# Patient Record
Sex: Male | Born: 1980 | Race: Black or African American | Hispanic: No | Marital: Married | Smoking: Current every day smoker
Health system: Southern US, Community
[De-identification: ages and names within clinical notes are randomized; demographics above are authoritative.]

## PROBLEM LIST (undated history)

## (undated) DIAGNOSIS — E119 Type 2 diabetes mellitus without complications: Secondary | ICD-10-CM

## (undated) DIAGNOSIS — G8220 Paraplegia, unspecified: Secondary | ICD-10-CM

## (undated) DIAGNOSIS — I10 Essential (primary) hypertension: Secondary | ICD-10-CM

## (undated) DIAGNOSIS — R531 Weakness: Secondary | ICD-10-CM

## (undated) DIAGNOSIS — R471 Dysarthria and anarthria: Secondary | ICD-10-CM

## (undated) DIAGNOSIS — Q159 Congenital malformation of eye, unspecified: Secondary | ICD-10-CM

## (undated) DIAGNOSIS — G35 Multiple sclerosis: Secondary | ICD-10-CM

## (undated) DIAGNOSIS — F329 Major depressive disorder, single episode, unspecified: Secondary | ICD-10-CM

## (undated) DIAGNOSIS — R41841 Cognitive communication deficit: Secondary | ICD-10-CM

## (undated) DIAGNOSIS — F32A Depression, unspecified: Secondary | ICD-10-CM

## (undated) HISTORY — PX: WISDOM TOOTH EXTRACTION: SHX21

## (undated) HISTORY — DX: Weakness: R53.1

## (undated) HISTORY — DX: Type 2 diabetes mellitus without complications: E11.9

## (undated) HISTORY — DX: Congenital malformation of eye, unspecified: Q15.9

## (undated) HISTORY — DX: Multiple sclerosis: G35

---

## 2008-12-18 ENCOUNTER — Ambulatory Visit (HOSPITAL_COMMUNITY): Admission: RE | Admit: 2008-12-18 | Discharge: 2008-12-18 | Payer: Self-pay | Admitting: Neurology

## 2010-08-06 LAB — BUN: BUN: 9 mg/dL (ref 6–23)

## 2010-08-06 LAB — CREATININE, SERUM: GFR calc non Af Amer: >60 mL/min (ref >60)

## 2010-09-01 ENCOUNTER — Encounter (HOSPITAL_COMMUNITY)
Admission: RE | Admit: 2010-09-01 | Discharge: 2010-09-01 | Disposition: A | Discharge status: Home / Self Care | Payer: 59 | Source: Ambulatory Visit | Attending: Neurology | Admitting: Neurology

## 2010-09-01 DIAGNOSIS — G35 Multiple sclerosis: Secondary | ICD-10-CM | POA: Insufficient documentation

## 2010-10-03 ENCOUNTER — Encounter (HOSPITAL_COMMUNITY)
Admission: RE | Admit: 2010-10-03 | Discharge: 2010-10-03 | Disposition: A | Discharge status: Home / Self Care | Payer: 59 | Source: Ambulatory Visit | Attending: Neurology | Admitting: Neurology

## 2010-10-03 ENCOUNTER — Encounter (HOSPITAL_COMMUNITY): Payer: Self-pay

## 2010-10-03 DIAGNOSIS — G35 Multiple sclerosis: Secondary | ICD-10-CM | POA: Insufficient documentation

## 2010-10-31 ENCOUNTER — Encounter (HOSPITAL_COMMUNITY): Payer: 59 | Attending: Neurology

## 2010-10-31 DIAGNOSIS — G35 Multiple sclerosis: Secondary | ICD-10-CM | POA: Insufficient documentation

## 2010-11-28 ENCOUNTER — Encounter (HOSPITAL_COMMUNITY): Payer: 59

## 2010-12-26 ENCOUNTER — Encounter (HOSPITAL_COMMUNITY): Payer: 59 | Attending: Neurology

## 2010-12-26 DIAGNOSIS — G35 Multiple sclerosis: Secondary | ICD-10-CM | POA: Insufficient documentation

## 2011-01-24 ENCOUNTER — Encounter (HOSPITAL_COMMUNITY)
Admission: RE | Admit: 2011-01-24 | Discharge: 2011-01-24 | Disposition: A | Discharge status: Home / Self Care | Payer: 59 | Source: Ambulatory Visit | Attending: Neurology | Admitting: Neurology

## 2011-01-24 DIAGNOSIS — G35 Multiple sclerosis: Secondary | ICD-10-CM | POA: Insufficient documentation

## 2011-02-26 ENCOUNTER — Emergency Department (HOSPITAL_COMMUNITY): Payer: 59

## 2011-02-26 ENCOUNTER — Inpatient Hospital Stay (HOSPITAL_COMMUNITY)
Admission: EM | Admit: 2011-02-26 | Discharge: 2011-02-27 | DRG: 200 | Disposition: A | Discharge status: Home / Self Care | Payer: 59 | Attending: Surgery | Admitting: Surgery

## 2011-02-26 DIAGNOSIS — S270XXA Traumatic pneumothorax, initial encounter: Principal | ICD-10-CM | POA: Diagnosis present

## 2011-02-26 DIAGNOSIS — Z79899 Other long term (current) drug therapy: Secondary | ICD-10-CM

## 2011-02-26 DIAGNOSIS — IMO0002 Reserved for concepts with insufficient information to code with codable children: Secondary | ICD-10-CM

## 2011-02-26 DIAGNOSIS — S2249XA Multiple fractures of ribs, unspecified side, initial encounter for closed fracture: Secondary | ICD-10-CM

## 2011-02-26 DIAGNOSIS — G35 Multiple sclerosis: Secondary | ICD-10-CM | POA: Diagnosis present

## 2011-02-26 DIAGNOSIS — Y92410 Unspecified street and highway as the place of occurrence of the external cause: Secondary | ICD-10-CM

## 2011-02-26 LAB — TYPE AND SCREEN
ABO/RH(D): O POS
Antibody Screen: NEGATIVE

## 2011-02-26 LAB — COMPREHENSIVE METABOLIC PANEL
AST: 98 U/L — ABNORMAL HIGH (ref 0–37)
BUN: 11 mg/dL (ref 6–23)
CO2: 22 mEq/L (ref 19–32)
Calcium: 9.7 mg/dL (ref 8.4–10.5)
Chloride: 102 mEq/L (ref 96–112)
Creatinine, Ser: 0.89 mg/dL (ref 0.50–1.35)
GFR calc Af Amer: >90 mL/min (ref >90)
GFR calc non Af Amer: >90 mL/min (ref >90)
Glucose, Bld: 113 mg/dL — ABNORMAL HIGH (ref 70–99)
Total Bilirubin: 0.3 mg/dL (ref 0.3–1.2)

## 2011-02-26 LAB — CBC
HCT: 43.2 % (ref 39.0–52.0)
MCH: 29.6 pg (ref 26.0–34.0)
MCHC: 35.2 g/dL (ref 30.0–36.0)
MCV: 84 fL (ref 78.0–100.0)
Platelets: 238 10*3/uL (ref 150–400)
RDW: 14.2 % (ref 11.5–15.5)

## 2011-02-26 LAB — POCT I-STAT, CHEM 8
BUN: 11 mg/dL (ref 6–23)
Calcium, Ion: 1.13 mmol/L (ref 1.12–1.32)
Chloride: 106 mEq/L (ref 96–112)
Glucose, Bld: 111 mg/dL — ABNORMAL HIGH (ref 70–99)
TCO2: 22 mmol/L (ref 0–100)

## 2011-02-26 LAB — URINE MICROSCOPIC-ADD ON

## 2011-02-26 LAB — URINALYSIS, ROUTINE W REFLEX MICROSCOPIC
Bilirubin Urine: NEGATIVE
Ketones, ur: NEGATIVE mg/dL
Specific Gravity, Urine: 1.008 (ref 1.005–1.030)
Urobilinogen, UA: 1 mg/dL (ref 0.0–1.0)

## 2011-02-26 LAB — ETHANOL: Alcohol, Ethyl (B): <11 mg/dL (ref 0–11)

## 2011-02-26 LAB — RAPID URINE DRUG SCREEN, HOSP PERFORMED
Barbiturates: NOT DETECTED
Cocaine: NOT DETECTED

## 2011-02-26 LAB — ABO/RH: ABO/RH(D): O POS

## 2011-02-26 MED ORDER — IOHEXOL 300 MG/ML  SOLN: 100.0000 mL | Freq: Once | INTRAMUSCULAR | Status: AC | PRN

## 2011-02-27 ENCOUNTER — Encounter (HOSPITAL_COMMUNITY): Payer: 59

## 2011-02-27 ENCOUNTER — Inpatient Hospital Stay (HOSPITAL_COMMUNITY): Payer: 59

## 2011-02-27 LAB — MRSA PCR SCREENING: MRSA by PCR: NEGATIVE

## 2011-02-27 LAB — CBC
Hemoglobin: 13.6 g/dL (ref 13.0–17.0)
RBC: 4.68 MIL/uL (ref 4.22–5.81)

## 2011-02-27 LAB — BASIC METABOLIC PANEL
CO2: 25 mEq/L (ref 19–32)
Glucose, Bld: 139 mg/dL — ABNORMAL HIGH (ref 70–99)
Potassium: 3.8 mEq/L (ref 3.5–5.1)
Sodium: 136 mEq/L (ref 135–145)

## 2011-02-28 ENCOUNTER — Telehealth (INDEPENDENT_AMBULATORY_CARE_PROVIDER_SITE_OTHER): Payer: Self-pay | Admitting: Physician Assistant

## 2011-02-28 LAB — GLUCOSE, CAPILLARY

## 2011-02-28 MED ORDER — OXYCODONE-ACETAMINOPHEN 5-325 MG PO TABS: 1.0000 | ORAL_TABLET | ORAL | Status: DC | PRN

## 2011-02-28 NOTE — Telephone Encounter (Signed)
Pt called to say his pain med is not helping (Vicodin) He requests something stronger. Rx for Percocet 5 written and pt wife to pick up RX from Trauma office.
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
## 2011-03-02 ENCOUNTER — Encounter (INDEPENDENT_AMBULATORY_CARE_PROVIDER_SITE_OTHER): Payer: Self-pay

## 2011-03-02 ENCOUNTER — Ambulatory Visit (INDEPENDENT_AMBULATORY_CARE_PROVIDER_SITE_OTHER): Payer: 59 | Admitting: Physician Assistant

## 2011-03-02 DIAGNOSIS — S2249XA Multiple fractures of ribs, unspecified side, initial encounter for closed fracture: Secondary | ICD-10-CM

## 2011-03-02 DIAGNOSIS — IMO0002 Reserved for concepts with insufficient information to code with codable children: Secondary | ICD-10-CM

## 2011-03-02 DIAGNOSIS — T07XXXA Unspecified multiple injuries, initial encounter: Secondary | ICD-10-CM

## 2011-03-02 DIAGNOSIS — G35 Multiple sclerosis: Secondary | ICD-10-CM

## 2011-03-02 MED ORDER — OXYCODONE-ACETAMINOPHEN 5-325 MG PO TABS: 1.0000 | ORAL_TABLET | ORAL | Status: AC | PRN

## 2011-03-02 MED ORDER — OXYCODONE-ACETAMINOPHEN 5-325 MG PO TABS: 1.0000 | ORAL_TABLET | ORAL | Status: DC | PRN
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
## 2011-03-02 NOTE — Patient Instructions (Signed)
Home Health Nursing for wound care.  Gradually increase activities as tolerated. Call trauma Service for concerns, but no follow up appointment needed unless having problems

## 2011-03-06 ENCOUNTER — Telehealth: Payer: Self-pay | Admitting: Orthopedic Surgery

## 2011-03-06 ENCOUNTER — Telehealth (INDEPENDENT_AMBULATORY_CARE_PROVIDER_SITE_OTHER): Payer: Self-pay | Admitting: General Surgery

## 2011-03-06 NOTE — Telephone Encounter (Signed)
Betsy of [facility] called asking if their protocol could be followed for this patient. In addition, they did not get the insurance information on this patient. I called her back at 11:36 to advise she would need to contact the trauma clinic directly in order to obtain the assistance she needed.

## 2011-03-06 NOTE — Telephone Encounter (Signed)
Left message for home health. They had called with questions about dressing changes.

## 2011-03-13 ENCOUNTER — Telehealth: Payer: Self-pay | Admitting: Physician Assistant

## 2011-03-13 MED ORDER — HYDROCODONE-ACETAMINOPHEN 5-325 MG PO TABS: 1.0000 | ORAL_TABLET | Freq: Four times a day (QID) | ORAL | Status: AC | PRN
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
## 2011-03-14 ENCOUNTER — Other Ambulatory Visit (HOSPITAL_COMMUNITY): Payer: Self-pay | Admitting: *Deleted

## 2011-03-16 ENCOUNTER — Encounter (HOSPITAL_COMMUNITY): Admission: RE | Admit: 2011-03-16 | Payer: 59 | Source: Ambulatory Visit

## 2011-03-20 ENCOUNTER — Encounter (HOSPITAL_COMMUNITY): Payer: Self-pay | Admitting: *Deleted

## 2011-03-21 ENCOUNTER — Other Ambulatory Visit (HOSPITAL_COMMUNITY): Payer: Self-pay | Admitting: *Deleted

## 2011-03-22 ENCOUNTER — Encounter (HOSPITAL_COMMUNITY)
Admission: RE | Admit: 2011-03-22 | Discharge: 2011-03-22 | Disposition: A | Discharge status: Home / Self Care | Payer: 59 | Source: Ambulatory Visit | Attending: Neurology | Admitting: Neurology

## 2011-03-22 ENCOUNTER — Encounter (HOSPITAL_COMMUNITY): Payer: Self-pay

## 2011-03-22 ENCOUNTER — Other Ambulatory Visit (HOSPITAL_COMMUNITY): Payer: Self-pay | Admitting: *Deleted

## 2011-03-22 DIAGNOSIS — G35 Multiple sclerosis: Secondary | ICD-10-CM | POA: Insufficient documentation

## 2011-03-22 HISTORY — DX: Essential (primary) hypertension: I10

## 2011-03-22 MED ORDER — ACETAMINOPHEN 500 MG PO TABS
1000.0000 mg | ORAL_TABLET | Freq: Once | ORAL | Status: AC
  Administered 2011-03-22: 1000 mg via ORAL
  Filled 2011-03-22: qty 2

## 2011-03-22 MED ORDER — SODIUM CHLORIDE 0.9 % IV SOLN
INTRAVENOUS | Status: DC
  Administered 2011-03-22: 250 mL via INTRAVENOUS

## 2011-03-22 MED ORDER — LORATADINE 10 MG PO TABS
10.0000 mg | ORAL_TABLET | Freq: Every day | ORAL | Status: DC
  Administered 2011-03-22: 10 mg via ORAL

## 2011-03-22 MED ORDER — SODIUM CHLORIDE 0.9 % IV SOLN
300.0000 mg | INTRAVENOUS | Status: DC
  Administered 2011-03-22: 300 mg via INTRAVENOUS
  Filled 2011-03-22: qty 15

## 2011-04-19 ENCOUNTER — Encounter (HOSPITAL_COMMUNITY): Admission: RE | Admit: 2011-04-19 | Payer: 59 | Source: Ambulatory Visit

## 2011-04-21 ENCOUNTER — Other Ambulatory Visit: Payer: Self-pay | Admitting: Neurology

## 2011-04-21 DIAGNOSIS — G35 Multiple sclerosis: Secondary | ICD-10-CM

## 2011-04-21 MED ORDER — SODIUM CHLORIDE 0.9 % IV SOLN: 500.0000 mg | INTRAVENOUS | Status: DC | PRN

## 2011-04-21 MED ORDER — SODIUM CHLORIDE 0.9 % IV SOLN: INTRAVENOUS | Status: DC

## 2011-04-21 MED ORDER — LORATADINE 10 MG PO TABS: 10.0000 mg | ORAL_TABLET | Freq: Once | ORAL | Status: DC

## 2011-04-21 MED ORDER — SODIUM CHLORIDE 0.9 % IV SOLN: 300.0000 mg | INTRAVENOUS | Status: DC

## 2011-04-21 MED ORDER — ACETAMINOPHEN 500 MG PO TABS: 1000.0000 mg | ORAL_TABLET | Freq: Once | ORAL | Status: DC

## 2011-04-21 MED ORDER — EPINEPHRINE HCL 1 MG/ML IJ SOLN: 1.0000 mg | INTRAMUSCULAR | Status: DC | PRN

## 2011-04-21 MED ORDER — EPINEPHRINE 0.3 MG/0.3ML IJ DEVI: 0.3000 mg | INTRAMUSCULAR | Status: DC | PRN

## 2011-04-26 ENCOUNTER — Encounter (HOSPITAL_COMMUNITY)
Admission: RE | Admit: 2011-04-26 | Discharge: 2011-04-26 | Disposition: A | Discharge status: Home / Self Care | Payer: 59 | Source: Ambulatory Visit | Attending: Neurology | Admitting: Neurology

## 2011-04-26 DIAGNOSIS — G35 Multiple sclerosis: Secondary | ICD-10-CM | POA: Insufficient documentation

## 2011-05-09 ENCOUNTER — Encounter (HOSPITAL_COMMUNITY)
Admission: RE | Admit: 2011-05-09 | Discharge: 2011-05-09 | Disposition: A | Discharge status: Home / Self Care | Payer: 59 | Source: Ambulatory Visit | Attending: Neurology | Admitting: Neurology

## 2011-05-09 DIAGNOSIS — G35 Multiple sclerosis: Secondary | ICD-10-CM | POA: Insufficient documentation

## 2011-05-09 MED ORDER — SODIUM CHLORIDE 0.9 % IV SOLN
500.0000 mg | INTRAVENOUS | Status: DC | PRN
  Filled 2011-05-09: qty 4

## 2011-05-09 MED ORDER — SODIUM CHLORIDE 0.9 % IV SOLN
INTRAVENOUS | Status: DC
  Administered 2011-05-09: 13:00:00 via INTRAVENOUS

## 2011-05-09 MED ORDER — ACETAMINOPHEN 500 MG PO TABS
ORAL_TABLET | ORAL | Status: AC
  Administered 2011-05-09: 1000 mg via ORAL
  Filled 2011-05-09: qty 2

## 2011-05-09 MED ORDER — ACETAMINOPHEN 500 MG PO TABS
1000.0000 mg | ORAL_TABLET | ORAL | Status: DC
  Administered 2011-05-09: 1000 mg via ORAL
  Filled 2011-05-09: qty 2

## 2011-05-09 MED ORDER — LORATADINE 10 MG PO TABS
10.0000 mg | ORAL_TABLET | ORAL | Status: DC
  Administered 2011-05-09: 10 mg via ORAL

## 2011-05-09 MED ORDER — EPINEPHRINE 0.3 MG/0.3ML IJ DEVI
0.3000 mg | INTRAMUSCULAR | Status: DC | PRN
  Filled 2011-05-09: qty 0.6

## 2011-05-09 MED ORDER — SODIUM CHLORIDE 0.9 % IV SOLN
300.0000 mg | INTRAVENOUS | Status: DC
  Administered 2011-05-09: 300 mg via INTRAVENOUS
  Filled 2011-05-09: qty 15

## 2011-06-06 ENCOUNTER — Encounter (HOSPITAL_COMMUNITY): Admission: RE | Admit: 2011-06-06 | Payer: 59 | Source: Ambulatory Visit

## 2011-06-14 NOTE — Progress Notes (Signed)
Called pt to remind him of 06/15/11 appt. No answer.

## 2011-06-15 ENCOUNTER — Encounter (HOSPITAL_COMMUNITY)
Admission: RE | Admit: 2011-06-15 | Discharge: 2011-06-15 | Disposition: A | Discharge status: Home / Self Care | Payer: 59 | Source: Ambulatory Visit | Attending: Neurology | Admitting: Neurology

## 2011-06-15 ENCOUNTER — Encounter (HOSPITAL_COMMUNITY): Payer: Self-pay

## 2011-06-15 DIAGNOSIS — G35 Multiple sclerosis: Secondary | ICD-10-CM | POA: Insufficient documentation

## 2011-06-15 MED ORDER — ACETAMINOPHEN 500 MG PO TABS
1000.0000 mg | ORAL_TABLET | ORAL | Status: DC
  Administered 2011-06-15: 1000 mg via ORAL

## 2011-06-15 MED ORDER — LORATADINE 10 MG PO TABS
10.0000 mg | ORAL_TABLET | ORAL | Status: DC
  Administered 2011-06-15: 10 mg via ORAL

## 2011-06-15 MED ORDER — SODIUM CHLORIDE 0.9 % IV SOLN
INTRAVENOUS | Status: DC
  Administered 2011-06-15: 09:00:00 via INTRAVENOUS

## 2011-06-15 MED ORDER — SODIUM CHLORIDE 0.9 % IV SOLN
300.0000 mg | INTRAVENOUS | Status: DC
  Filled 2011-06-15: qty 15

## 2011-06-15 NOTE — Discharge Instructions (Signed)
Natalizumab injection What is this medicine? NATALIZUMAB (na ta LIZ you mab) is used to treat relapsing multiple sclerosis. This drug is not a cure. It is also used to treat Crohn's disease. This medicine may be used for other purposes; ask your health care provider or pharmacist if you have questions. What should I tell my health care provider before I take this medicine? They need to know if you have any of these conditions: -immune system problems -progressive multifocal leukoencephalopathy (PML) -an unusual or allergic reaction to natalizumab, other medicines, foods, dyes, or preservatives -pregnant or trying to get pregnant -breast-feeding How should I use this medicine? This medicine is for infusion into a vein. It is given by a health care professional in a hospital or clinic setting. A special MedGuide will be given to you by the pharmacist with each prescription and refill. Be sure to read this information carefully each time. Talk to your pediatrician regarding the use of this medicine in children. This medicine is not approved for use in children. Overdosage: If you think you have taken too much of this medicine contact a poison control center or emergency room at once. NOTE: This medicine is only for you. Do not share this medicine with others. What if I miss a dose? It is important not to miss your dose. Call your doctor or health care professional if you are unable to keep an appointment. What may interact with this medicine? -azathioprine -cyclosporine -interferon -6-mercaptopurine -methotrexate -steroid medicines like prednisone or cortisone -TNF-alpha inhibitors like adalimumab, etanercept, and infliximab -vaccines This list may not describe all possible interactions. Give your health care provider a list of all the medicines, herbs, non-prescription drugs, or dietary supplements you use. Also tell them if you smoke, drink alcohol, or use illegal drugs. Some items may  interact with your medicine. What should I watch for while using this medicine? Your condition will be monitored carefully while you are receiving this medicine. Visit your doctor for regular check ups. Tell your doctor or healthcare professional if your symptoms do not start to get better or if they get worse. Stay away from people who are sick. Call your doctor or health care professional for advice if you get a fever, chills or sore throat, or other symptoms of a cold or flu. Do not treat yourself. In some patients, this medicine may cause a serious brain infection that may cause death. If you have any problems seeing, thinking, speaking, walking, or standing, tell your doctor right away. If you cannot reach your doctor, get urgent medical care. What side effects may I notice from receiving this medicine? Side effects that you should report to your doctor or health care professional as soon as possible: -allergic reactions like skin rash, itching or hives, swelling of the face, lips, or tongue -breathing problems -changes in vision -chest pain -dark urine -depression, feelings of sadness -dizziness -general ill feeling or flu-like symptoms -irregular, missed, or painful menstrual periods -light-colored stools -loss of appetite, nausea -muscle weakness -problems with balance, talking, or walking -right upper belly pain -unusually weak or tired -yellowing of the eyes or skin Side effects that usually do not require medical attention (report to your doctor or health care professional if they continue or are bothersome): -aches, pains -headache -stomach upset -tiredness This list may not describe all possible side effects. Call your doctor for medical advice about side effects. You may report side effects to FDA at 1-800-FDA-1088. Where should I keep my medicine? This drug   is given in a hospital or clinic and will not be stored at home. NOTE: This sheet is a summary. It may not cover  all possible information. If you have questions about this medicine, talk to your doctor, pharmacist, or health care provider.  2012, Elsevier/Gold Standard. (06/06/2008 1:33:21 PM) 

## 2011-07-13 ENCOUNTER — Encounter (HOSPITAL_COMMUNITY)
Admission: RE | Admit: 2011-07-13 | Discharge: 2011-07-13 | Disposition: A | Discharge status: Home / Self Care | Payer: Medicare Other | Source: Ambulatory Visit | Attending: Neurology | Admitting: Neurology

## 2011-07-13 ENCOUNTER — Encounter (HOSPITAL_COMMUNITY): Payer: Self-pay

## 2011-07-13 DIAGNOSIS — G35 Multiple sclerosis: Secondary | ICD-10-CM | POA: Insufficient documentation

## 2011-07-13 MED ORDER — SODIUM CHLORIDE 0.9 % IV SOLN
INTRAVENOUS | Status: AC
  Administered 2011-07-13: 08:00:00 via INTRAVENOUS

## 2011-07-13 MED ORDER — SODIUM CHLORIDE 0.9 % IV SOLN
300.0000 mg | INTRAVENOUS | Status: DC
  Administered 2011-07-13: 300 mg via INTRAVENOUS
  Filled 2011-07-13: qty 15

## 2011-07-13 MED ORDER — ACETAMINOPHEN 500 MG PO TABS
1000.0000 mg | ORAL_TABLET | ORAL | Status: DC
  Administered 2011-07-13: 1000 mg via ORAL

## 2011-07-13 MED ORDER — LORATADINE 10 MG PO TABS
10.0000 mg | ORAL_TABLET | ORAL | Status: DC
  Administered 2011-07-13: 10 mg via ORAL

## 2011-07-13 NOTE — Discharge Instructions (Signed)
Natalizumab injection What is this medicine? NATALIZUMAB (na ta LIZ you mab) is used to treat relapsing multiple sclerosis. This drug is not a cure. It is also used to treat Crohn's disease. This medicine may be used for other purposes; ask your health care provider or pharmacist if you have questions. What should I tell my health care provider before I take this medicine? They need to know if you have any of these conditions: -immune system problems -progressive multifocal leukoencephalopathy (PML) -an unusual or allergic reaction to natalizumab, other medicines, foods, dyes, or preservatives -pregnant or trying to get pregnant -breast-feeding How should I use this medicine? This medicine is for infusion into a vein. It is given by a health care professional in a hospital or clinic setting. A special MedGuide will be given to you by the pharmacist with each prescription and refill. Be sure to read this information carefully each time. Talk to your pediatrician regarding the use of this medicine in children. This medicine is not approved for use in children. Overdosage: If you think you have taken too much of this medicine contact a poison control center or emergency room at once. NOTE: This medicine is only for you. Do not share this medicine with others. What if I miss a dose? It is important not to miss your dose. Call your doctor or health care professional if you are unable to keep an appointment. What may interact with this medicine? -azathioprine -cyclosporine -interferon -6-mercaptopurine -methotrexate -steroid medicines like prednisone or cortisone -TNF-alpha inhibitors like adalimumab, etanercept, and infliximab -vaccines This list may not describe all possible interactions. Give your health care provider a list of all the medicines, herbs, non-prescription drugs, or dietary supplements you use. Also tell them if you smoke, drink alcohol, or use illegal drugs. Some items may  interact with your medicine. What should I watch for while using this medicine? Your condition will be monitored carefully while you are receiving this medicine. Visit your doctor for regular check ups. Tell your doctor or healthcare professional if your symptoms do not start to get better or if they get worse. Stay away from people who are sick. Call your doctor or health care professional for advice if you get a fever, chills or sore throat, or other symptoms of a cold or flu. Do not treat yourself. In some patients, this medicine may cause a serious brain infection that may cause death. If you have any problems seeing, thinking, speaking, walking, or standing, tell your doctor right away. If you cannot reach your doctor, get urgent medical care. What side effects may I notice from receiving this medicine? Side effects that you should report to your doctor or health care professional as soon as possible: -allergic reactions like skin rash, itching or hives, swelling of the face, lips, or tongue -breathing problems -changes in vision -chest pain -dark urine -depression, feelings of sadness -dizziness -general ill feeling or flu-like symptoms -irregular, missed, or painful menstrual periods -light-colored stools -loss of appetite, nausea -muscle weakness -problems with balance, talking, or walking -right upper belly pain -unusually weak or tired -yellowing of the eyes or skin Side effects that usually do not require medical attention (report to your doctor or health care professional if they continue or are bothersome): -aches, pains -headache -stomach upset -tiredness This list may not describe all possible side effects. Call your doctor for medical advice about side effects. You may report side effects to FDA at 1-800-FDA-1088. Where should I keep my medicine? This drug   is given in a hospital or clinic and will not be stored at home. NOTE: This sheet is a summary. It may not cover  all possible information. If you have questions about this medicine, talk to your doctor, pharmacist, or health care provider.  2012, Elsevier/Gold Standard. (06/06/2008 1:33:21 PM) 

## 2011-07-13 NOTE — Progress Notes (Signed)
Patient didn't want to stay and be observed/monitored for the one hour post infusion.

## 2011-08-01 ENCOUNTER — Other Ambulatory Visit: Payer: Self-pay | Admitting: Neurology

## 2011-08-01 DIAGNOSIS — N529 Male erectile dysfunction, unspecified: Secondary | ICD-10-CM

## 2011-08-01 DIAGNOSIS — F5102 Adjustment insomnia: Secondary | ICD-10-CM

## 2011-08-01 DIAGNOSIS — Z79899 Other long term (current) drug therapy: Secondary | ICD-10-CM

## 2011-08-03 ENCOUNTER — Ambulatory Visit
Admission: RE | Admit: 2011-08-03 | Discharge: 2011-08-03 | Disposition: A | Discharge status: Home / Self Care | Payer: Medicare Other | Source: Ambulatory Visit | Attending: Neurology | Admitting: Neurology

## 2011-08-03 DIAGNOSIS — Z79899 Other long term (current) drug therapy: Secondary | ICD-10-CM

## 2011-08-03 DIAGNOSIS — F5102 Adjustment insomnia: Secondary | ICD-10-CM

## 2011-08-03 DIAGNOSIS — N529 Male erectile dysfunction, unspecified: Secondary | ICD-10-CM

## 2011-08-03 MED ORDER — GADOBENATE DIMEGLUMINE 529 MG/ML IV SOLN
18.0000 mL | Freq: Once | INTRAVENOUS | Status: AC | PRN
  Administered 2011-08-03: 18 mL via INTRAVENOUS

## 2011-08-10 ENCOUNTER — Encounter (HOSPITAL_COMMUNITY): Admission: RE | Admit: 2011-08-10 | Payer: 59 | Source: Ambulatory Visit

## 2011-09-01 ENCOUNTER — Encounter (HOSPITAL_COMMUNITY)
Admission: RE | Admit: 2011-09-01 | Discharge: 2011-09-01 | Disposition: A | Discharge status: Home / Self Care | Payer: Medicare Other | Source: Ambulatory Visit | Attending: Neurology | Admitting: Neurology

## 2011-09-01 DIAGNOSIS — G35 Multiple sclerosis: Secondary | ICD-10-CM | POA: Insufficient documentation

## 2011-09-01 MED ORDER — SODIUM CHLORIDE 0.9 % IV SOLN
INTRAVENOUS | Status: DC
  Administered 2011-09-01: 12:00:00 via INTRAVENOUS

## 2011-09-01 MED ORDER — LORATADINE 10 MG PO TABS
ORAL_TABLET | ORAL | Status: AC
  Filled 2011-09-01: qty 1

## 2011-09-01 MED ORDER — LORATADINE 10 MG PO TABS
10.0000 mg | ORAL_TABLET | ORAL | Status: AC
  Administered 2011-09-01: 10 mg via ORAL

## 2011-09-01 MED ORDER — SODIUM CHLORIDE 0.9 % IV SOLN
300.0000 mg | INTRAVENOUS | Status: AC
  Administered 2011-09-01: 300 mg via INTRAVENOUS
  Filled 2011-09-01: qty 15

## 2011-09-01 MED ORDER — ACETAMINOPHEN 500 MG PO TABS
ORAL_TABLET | ORAL | Status: AC
  Filled 2011-09-01: qty 2

## 2011-09-01 MED ORDER — ACETAMINOPHEN 500 MG PO TABS
1000.0000 mg | ORAL_TABLET | ORAL | Status: AC
  Administered 2011-09-01: 1000 mg via ORAL

## 2011-09-01 NOTE — Progress Notes (Signed)
Patient prefers to leave now. Doesn't want to stay for observation. Out ambulatory w wife

## 2011-09-01 NOTE — Progress Notes (Signed)
Patient returned for treatment with wife and security. Cooperative at present.

## 2011-09-01 NOTE — Progress Notes (Signed)
Pt  Cursing and left here because he wanted IV started in anticubital, said he was paying cash and we could turn heat up also, said we had flat screens to "impress" but we wouldn't turn up heat cause it cost too much.  He got irate and got up and left.  Using loud curse words in hallway, said "who does she think she is to  question me .  These were questions about his health, such as are your home meds still the same  He refuses to stay

## 2011-09-01 NOTE — Discharge Instructions (Signed)
Call MD for any problems Return to Short Stay May 31 at 0830

## 2011-09-01 NOTE — Progress Notes (Signed)
Sandy  with dr Terrace Arabia notified of pts actions and his leaving.  States dr Terrace Arabia is trying to call him and his wife to have pt come back and receive tysabri. I have talked to terry, my Facilities manager two times about this, and we will certainly infuse this pt if he chooses to come back

## 2011-09-01 NOTE — Progress Notes (Signed)
After pt had been asked all questions on MCA checklist and pre-infusion checklist for tysabri, pt then tells me he did ttreat himself c prednisone he had at home.  This was for blurred vision  Call placed to dr Terrace Arabia and she stated proceed c tysabri

## 2011-09-29 ENCOUNTER — Encounter (HOSPITAL_COMMUNITY): Payer: Medicare Other

## 2011-10-11 ENCOUNTER — Encounter (HOSPITAL_COMMUNITY): Admission: RE | Admit: 2011-10-11 | Payer: Medicare Other | Source: Ambulatory Visit

## 2011-10-12 ENCOUNTER — Encounter (HOSPITAL_COMMUNITY): Payer: Medicare Other

## 2011-10-17 MED ORDER — SODIUM CHLORIDE 0.9 % IV SOLN: 300.0000 mg | INTRAVENOUS | Status: DC

## 2011-10-17 MED ORDER — ACETAMINOPHEN 500 MG PO TABS: 1000.0000 mg | ORAL_TABLET | ORAL | Status: DC

## 2011-10-17 MED ORDER — LORATADINE 10 MG PO TABS: 10.0000 mg | ORAL_TABLET | ORAL | Status: DC

## 2011-10-17 MED ORDER — SODIUM CHLORIDE 0.9 % IV SOLN: INTRAVENOUS | Status: DC

## 2011-10-17 NOTE — Progress Notes (Signed)
Patient called and reminded of app't in short stay for his tysabri.

## 2011-10-18 ENCOUNTER — Encounter (HOSPITAL_COMMUNITY)
Admission: RE | Admit: 2011-10-18 | Discharge: 2011-10-18 | Disposition: A | Discharge status: Home / Self Care | Payer: Medicare Other | Source: Ambulatory Visit | Attending: Neurology | Admitting: Neurology

## 2011-10-18 ENCOUNTER — Encounter (HOSPITAL_COMMUNITY): Payer: Self-pay

## 2011-10-18 ENCOUNTER — Other Ambulatory Visit: Payer: Self-pay | Admitting: Neurology

## 2011-10-18 DIAGNOSIS — G35 Multiple sclerosis: Secondary | ICD-10-CM | POA: Insufficient documentation

## 2011-10-18 MED ORDER — ACETAMINOPHEN 500 MG PO TABS
1000.0000 mg | ORAL_TABLET | ORAL | Status: DC
  Administered 2011-10-18: 1000 mg via ORAL

## 2011-10-18 MED ORDER — LORATADINE 10 MG PO TABS: 10.0000 mg | ORAL_TABLET | ORAL | Status: DC

## 2011-10-18 MED ORDER — METHYLPREDNISOLONE SODIUM SUCC 125 MG IJ SOLR
500.0000 mg | INTRAMUSCULAR | Status: DC
Start: 2011-10-18 — End: 2012-02-07
  Filled 2011-10-18: qty 8

## 2011-10-18 MED ORDER — ACETAMINOPHEN 500 MG PO TABS
ORAL_TABLET | ORAL | Status: AC
  Administered 2011-10-18: 1000 mg via ORAL
  Filled 2011-10-18: qty 2

## 2011-10-18 MED ORDER — SODIUM CHLORIDE 0.9 % IV SOLN: INTRAVENOUS | Status: DC

## 2011-10-18 MED ORDER — SODIUM CHLORIDE 0.9 % IV SOLN: 300.0000 mg | INTRAVENOUS | Status: DC

## 2011-10-18 MED ORDER — EPINEPHRINE 0.3 MG/0.3ML IJ DEVI
0.3000 mg | INTRAMUSCULAR | Status: DC
Start: 2011-10-18 — End: 2012-02-07
  Filled 2011-10-18: qty 0.6

## 2011-10-18 MED ORDER — LORATADINE 10 MG PO TABS
ORAL_TABLET | ORAL | Status: AC
  Administered 2011-10-18: 10 mg via ORAL
  Filled 2011-10-18: qty 1

## 2011-10-18 MED ORDER — ACETAMINOPHEN 325 MG PO TABS: 1000.0000 mg | ORAL_TABLET | ORAL | Status: DC

## 2011-10-18 MED ORDER — LORATADINE 10 MG PO TABS
10.0000 mg | ORAL_TABLET | ORAL | Status: DC
  Administered 2011-10-18: 10 mg via ORAL

## 2011-10-18 MED ORDER — SODIUM CHLORIDE 0.9 % IV SOLN
300.0000 mg | INTRAVENOUS | Status: DC
  Administered 2011-10-18: 300 mg via INTRAVENOUS
  Filled 2011-10-18: qty 15

## 2011-10-18 MED ORDER — EPINEPHRINE 0.3 MG/0.3ML IJ DEVI: 0.3000 mg | INTRAMUSCULAR | Status: DC

## 2011-10-18 MED ORDER — METHYLPREDNISOLONE SODIUM SUCC 125 MG IJ SOLR: 500.0000 mg | INTRAMUSCULAR | Status: DC

## 2011-10-18 MED ORDER — SODIUM CHLORIDE 0.9 % IV SOLN
INTRAVENOUS | Status: DC
  Administered 2011-10-18: 10:00:00 via INTRAVENOUS

## 2011-10-18 NOTE — Discharge Instructions (Signed)
Natalizumab injection What is this medicine? NATALIZUMAB (na ta LIZ you mab) is used to treat relapsing multiple sclerosis. This drug is not a cure. It is also used to treat Crohn's disease. This medicine may be used for other purposes; ask your health care provider or pharmacist if you have questions. What should I tell my health care provider before I take this medicine? They need to know if you have any of these conditions: -immune system problems -progressive multifocal leukoencephalopathy (PML) -an unusual or allergic reaction to natalizumab, other medicines, foods, dyes, or preservatives -pregnant or trying to get pregnant -breast-feeding How should I use this medicine? This medicine is for infusion into a vein. It is given by a health care professional in a hospital or clinic setting. A special MedGuide will be given to you by the pharmacist with each prescription and refill. Be sure to read this information carefully each time. Talk to your pediatrician regarding the use of this medicine in children. This medicine is not approved for use in children. Overdosage: If you think you have taken too much of this medicine contact a poison control center or emergency room at once. NOTE: This medicine is only for you. Do not share this medicine with others. What if I miss a dose? It is important not to miss your dose. Call your doctor or health care professional if you are unable to keep an appointment. What may interact with this medicine? -azathioprine -cyclosporine -interferon -6-mercaptopurine -methotrexate -steroid medicines like prednisone or cortisone -TNF-alpha inhibitors like adalimumab, etanercept, and infliximab -vaccines This list may not describe all possible interactions. Give your health care provider a list of all the medicines, herbs, non-prescription drugs, or dietary supplements you use. Also tell them if you smoke, drink alcohol, or use illegal drugs. Some items may  interact with your medicine. What should I watch for while using this medicine? Your condition will be monitored carefully while you are receiving this medicine. Visit your doctor for regular check ups. Tell your doctor or healthcare professional if your symptoms do not start to get better or if they get worse. Stay away from people who are sick. Call your doctor or health care professional for advice if you get a fever, chills or sore throat, or other symptoms of a cold or flu. Do not treat yourself. In some patients, this medicine may cause a serious brain infection that may cause death. If you have any problems seeing, thinking, speaking, walking, or standing, tell your doctor right away. If you cannot reach your doctor, get urgent medical care. What side effects may I notice from receiving this medicine? Side effects that you should report to your doctor or health care professional as soon as possible: -allergic reactions like skin rash, itching or hives, swelling of the face, lips, or tongue -breathing problems -changes in vision -chest pain -dark urine -depression, feelings of sadness -dizziness -general ill feeling or flu-like symptoms -irregular, missed, or painful menstrual periods -light-colored stools -loss of appetite, nausea -muscle weakness -problems with balance, talking, or walking -right upper belly pain -unusually weak or tired -yellowing of the eyes or skin Side effects that usually do not require medical attention (report to your doctor or health care professional if they continue or are bothersome): -aches, pains -headache -stomach upset -tiredness This list may not describe all possible side effects. Call your doctor for medical advice about side effects. You may report side effects to FDA at 1-800-FDA-1088. Where should I keep my medicine? This drug   is given in a hospital or clinic and will not be stored at home. NOTE: This sheet is a summary. It may not cover  all possible information. If you have questions about this medicine, talk to your doctor, pharmacist, or health care provider.  2012, Elsevier/Gold Standard. (06/06/2008 1:33:21 PM) 

## 2011-11-15 ENCOUNTER — Encounter (HOSPITAL_COMMUNITY)
Admission: RE | Admit: 2011-11-15 | Discharge: 2011-11-15 | Disposition: A | Discharge status: Home / Self Care | Payer: Medicare Other | Source: Ambulatory Visit | Attending: Neurology | Admitting: Neurology

## 2011-11-15 ENCOUNTER — Encounter (HOSPITAL_COMMUNITY): Payer: Self-pay

## 2011-11-15 DIAGNOSIS — G35 Multiple sclerosis: Secondary | ICD-10-CM | POA: Insufficient documentation

## 2011-11-15 MED ORDER — LORATADINE 5 MG/5ML PO SYRP: 10.0000 mg | ORAL_SOLUTION | ORAL | Status: DC

## 2011-11-15 MED ORDER — ACETAMINOPHEN 500 MG PO TABS
1000.0000 mg | ORAL_TABLET | ORAL | Status: DC
  Administered 2011-11-15: 1000 mg via ORAL

## 2011-11-15 MED ORDER — LORATADINE 10 MG PO TABS
10.0000 mg | ORAL_TABLET | Freq: Once | ORAL | Status: AC
  Administered 2011-11-15: 10 mg via ORAL

## 2011-11-15 MED ORDER — ACETAMINOPHEN 500 MG PO TABS
ORAL_TABLET | ORAL | Status: AC
  Administered 2011-11-15: 1000 mg via ORAL
  Filled 2011-11-15: qty 2

## 2011-11-15 MED ORDER — LORATADINE 10 MG PO TABS
ORAL_TABLET | ORAL | Status: AC
  Administered 2011-11-15: 10 mg via ORAL
  Filled 2011-11-15: qty 1

## 2011-11-15 MED ORDER — SODIUM CHLORIDE 0.9 % IV SOLN
300.0000 mg | INTRAVENOUS | Status: DC
  Administered 2011-11-15: 300 mg via INTRAVENOUS
  Filled 2011-11-15: qty 15

## 2011-11-15 MED ORDER — SODIUM CHLORIDE 0.9 % IV SOLN
INTRAVENOUS | Status: DC
  Administered 2011-11-15: 09:00:00 via INTRAVENOUS

## 2011-11-29 ENCOUNTER — Encounter (HOSPITAL_COMMUNITY): Payer: Self-pay | Admitting: Emergency Medicine

## 2011-11-29 ENCOUNTER — Emergency Department (HOSPITAL_COMMUNITY)
Admission: EM | Admit: 2011-11-29 | Discharge: 2011-11-29 | Disposition: A | Discharge status: Home / Self Care | Payer: Medicare Other | Attending: Emergency Medicine | Admitting: Emergency Medicine

## 2011-11-29 DIAGNOSIS — G35 Multiple sclerosis: Secondary | ICD-10-CM | POA: Insufficient documentation

## 2011-11-29 DIAGNOSIS — F32A Depression, unspecified: Secondary | ICD-10-CM

## 2011-11-29 DIAGNOSIS — F3289 Other specified depressive episodes: Secondary | ICD-10-CM | POA: Insufficient documentation

## 2011-11-29 DIAGNOSIS — F329 Major depressive disorder, single episode, unspecified: Secondary | ICD-10-CM | POA: Insufficient documentation

## 2011-11-29 HISTORY — DX: Depression, unspecified: F32.A

## 2011-11-29 HISTORY — DX: Major depressive disorder, single episode, unspecified: F32.9

## 2011-11-29 LAB — BASIC METABOLIC PANEL
BUN: 8 mg/dL (ref 6–23)
Calcium: 10 mg/dL (ref 8.4–10.5)
GFR calc non Af Amer: >90 mL/min (ref >90)
Glucose, Bld: 108 mg/dL — ABNORMAL HIGH (ref 70–99)

## 2011-11-29 LAB — CBC WITH DIFFERENTIAL/PLATELET
Basophils Relative: 1 % (ref 0–1)
Eosinophils Absolute: 0.1 10*3/uL (ref 0.0–0.7)
MCH: 28.2 pg (ref 26.0–34.0)
MCHC: 33.4 g/dL (ref 30.0–36.0)
Neutrophils Relative %: 41 % — ABNORMAL LOW (ref 43–77)
Platelets: 235 10*3/uL (ref 150–400)

## 2011-11-29 NOTE — ED Notes (Signed)
Pt belongings returned verified by myself with Candice and with pt. Pt verified that all his belongings were returned to him. Pt verbalized understanding to call the suicide hotline, 911, or return here immediately upon return of SI.

## 2011-11-29 NOTE — ED Provider Notes (Signed)
History     CSN: [id number]  Arrival date & time 11/29/11  0106   None     Chief Complaint  Patient presents with  . Suicidal    (Consider location/radiation/quality/duration/timing/severity/associated sxs/prior treatment) HPI Comments: Patient states he is depressed and suicidal.  He has no specific plan.  He, states nobody can understand what he is going through because he has multiple sclerosis unless we are in his position.  We can't understand he, states he's been taking Zoloft without any relief.  He is also been taking TYSABRI  Infusions for his multiple sclerosis without any perceived improvement.  He states his family.  Just doesn't understand why he has short-term memory loss or can't perform it is previous level of functioning.  He, states the only thing that gives him any relief is smoking, high-grade, marijuana  The history is provided by the patient.    Past Medical History  Diagnosis Date  . Multiple sclerosis   . Weakness     generalized  . Eye abnormalities     unspecified by pt  . Hypertension   . Depression     No past surgical history on file.  No family history on file.  History  Substance Use Topics  . Smoking status: Current Everyday Smoker -- 1.0 packs/day    Types: Cigarettes  . Smokeless tobacco: Not on file  . Alcohol Use: 7.2 oz/week    12 Cans of beer per week      Review of Systems  Constitutional: Negative for fever and chills.  Neurological: Negative for dizziness and weakness.  Psychiatric/Behavioral: Positive for suicidal ideas. Negative for confusion, dysphoric mood and agitation. The patient is not hyperactive.     Allergies  Review of patient's allergies indicates no known allergies.  Home Medications   Current Outpatient Rx  Name Route Sig Dispense Refill  . TYSABRI IV Intravenous Inject 300 mg into the vein every 30 (thirty) days.    . AMITRIPTYLINE HCL 25 MG PO TABS Oral Take 25 mg by mouth at bedtime. Pt "thinks "  this is his dose/ ask pt again if he recalls dosage     . OXYCODONE-ACETAMINOPHEN 5-325 MG PO TABS Oral Take 1-2 tablets by mouth every 4 (four) hours as needed for pain. 60 tablet 0    BP 136/86  Pulse 81  Temp 97.4 F (36.3 C) (Oral)  Resp 22  SpO2 98%  Physical Exam  Constitutional: He is oriented to person, place, and time. He appears well-developed and well-nourished.  HENT:  Head: Normocephalic.  Eyes: Pupils are equal, round, and reactive to light.  Neck: Normal range of motion.  Cardiovascular: Normal rate.   Pulmonary/Chest: Effort normal.  Neurological: He is alert and oriented to person, place, and time.  Skin: Skin is warm.  Psychiatric: His speech is normal. He exhibits a depressed mood. He expresses suicidal ideation. He expresses no suicidal plans.    ED Course  Procedures (including critical care time)  Labs Reviewed  BASIC METABOLIC PANEL - Abnormal; Notable for the following:    Glucose, Bld 108 (*)     All other components within normal limits  CBC WITH DIFFERENTIAL - Abnormal; Notable for the following:    Neutrophils Relative 41 (*)     Lymphocytes Relative 50 (*)     All other components within normal limits  ETHANOL  URINE RAPID DRUG SCREEN (HOSP PERFORMED)   No results found.   1. Depression  MDM   Spoke with ACT who will evaluate patient  Was evaluated by psychiatric social worker found not to be suicidal.  The patient has signed a Engineer, manufacturing systems with an agreement that he will call her psychiatrist first thing in the morning        Arman Filter, NP 11/29/11 0259  Arman Filter, NP 11/29/11 [phone]

## 2011-11-29 NOTE — ED Notes (Signed)
PAPER SCRUBS GIVEN TO PT. TO WEAR AT TRIAGE , SECURITY PAGED BY SECRETARY TO WAND PT.

## 2011-11-29 NOTE — ED Provider Notes (Signed)
Medical screening examination/treatment/procedure(s) were performed by non-physician practitioner and as supervising physician I was immediately available for consultation/collaboration.  Bristyn Kulesza M Constantine Ruddick, MD 11/29/11 0631 

## 2011-11-29 NOTE — ED Notes (Signed)
PT. REPORTS SUICIDAL IDEATION , DID NOT SPECIFY PLAN OF SUICIDE , STATES HISTORY OF DEPRESSION .

## 2011-11-29 NOTE — BH Assessment (Signed)
Assessment Note   [name] is an 31 y.o. male.  Pt came to [facility] because of depression and fleeting thoughts of SI.  He was diagnosed with MS in September of 2010 and has had some increasing symptoms of weakness.  Pt is upset about his diagnosis and is upset because other people don't understand it.  He says that he wished that others could have it for 5 minutes so that they would know what it was about.  Pt gets upset easily and will raise voice to make his point.  He talks about religion and his belief that we are living in the end times.  Patient talks almost non-stop for minutes at a time and will have flight of ideas and repeat the same themes.  He even congratulated this clinician for listening "to all this stuff coming out of my mouth."  Patient denies current SI, saying that he did not want to go to hell and he did not want to hurt his children.  Patient denies any HI or A/V hallucinations.  He did say that he had asked his neurologist for referrals to psychiatrists but did not get any yet.  He was able to contract for safety.  He did so and was given referral information for therapists and psychiatrists in this area.  Patient was discussed with Sharen Hones, NP who was in agreement with pt returning home with referrals.  Patient did sign a no harm contract and was given referral information. Axis I: 300.0 Anxiety D/O NOS Axis II: Deferred Axis III:  Past Medical History  Diagnosis Date  . Multiple sclerosis   . Weakness     generalized  . Eye abnormalities     unspecified by pt  . Hypertension   . Depression    Axis IV: occupational problems, other psychosocial or environmental problems and problems with primary support group Axis V: 51-60 moderate symptoms  Past Medical History:  Past Medical History  Diagnosis Date  . Multiple sclerosis   . Weakness     generalized  . Eye abnormalities     unspecified by pt  . Hypertension   . Depression     No past surgical history on  file.  Family History: No family history on file.  Social History:  reports that he has been smoking Cigarettes.  He has been smoking about 1 pack per day. He does not have any smokeless tobacco history on file. He reports that he drinks about 7.2 ounces of alcohol per week. He reports that he uses illicit drugs (Marijuana) about 30 times per week.  Additional Social History:  Alcohol / Drug Use Pain Medications: None Prescriptions: Zoloft but pt stopped taking it a few days ago Over the Counter: N/A History of alcohol / drug use?: Yes Substance #1 Name of Substance 1: Marijuana 1 - Age of First Use: Teens 1 - Amount (size/oz): He says he "smokes a lot"  but would not say exactly how much 1 - Frequency: Daily use 1 - Duration: On-going 1 - Last Use / Amount: 07/30  Amount unknown  CIWA: CIWA-Ar BP: 115/72 mmHg Pulse Rate: 76  COWS:    Allergies: No Known Allergies  Home Medications:  (Not in a hospital admission)  OB/GYN Status:  No LMP for male patient.  General Assessment Data Location of Assessment: [facility] ED Living Arrangements: Spouse/significant other;Children Can pt return to current living arrangement?: Yes Admission Status: Voluntary Is patient capable of signing voluntary admission?: Yes Transfer from: Acute  Hospital Referral Source: Self/Family/Friend     Risk to self Suicidal Ideation: No Suicidal Intent: No Is patient at risk for suicide?: No Suicidal Plan?: No Access to Means: No What has been your use of drugs/alcohol within the last 12 months?: Daily use of marijuana Previous Attempts/Gestures: No How many times?: 0  Other Self Harm Risks: None Triggers for Past Attempts: None known Intentional Self Injurious Behavior: None Family Suicide History: No Recent stressful life event(s): Recent negative physical changes (Has dx of multiple sclerosis) Persecutory voices/beliefs?: Yes Depression: Yes Depression Symptoms: Despondent;Loss of interest in  usual pleasures;Feeling angry/irritable Substance abuse history and/or treatment for substance abuse?: No Suicide prevention information given to non-admitted patients: Not applicable  Risk to Others Homicidal Ideation: No Thoughts of Harm to Others: No Current Homicidal Intent: No Current Homicidal Plan: No Access to Homicidal Means: No Identified Victim: No one History of harm to others?: No Assessment of Violence: None Noted Violent Behavior Description: Pt irritable but cooperative Does patient have access to weapons?: No Criminal Charges Pending?: No Does patient have a court date: No  Psychosis Hallucinations: None noted Delusions: Persecutory  Mental Status Report Appear/Hygiene:  (Casual) Eye Contact: Fair Motor Activity: Freedom of movement;Mannerisms Speech: Incoherent;Rapid;Tangential Level of Consciousness: Alert Mood: Anxious;Depressed;Irritable Affect: Anxious;Angry Anxiety Level: Severe Thought Processes: Irrelevant;Tangential Judgement: Unimpaired Orientation: Person;Place;Time;Situation Obsessive Compulsive Thoughts/Behaviors: Moderate  Cognitive Functioning Concentration: Decreased Memory: Recent Impaired;Remote Intact IQ: Average Insight: Fair Impulse Control: Good Appetite: Poor Weight Loss: 0  Weight Gain: 0  Sleep: Decreased Total Hours of Sleep:  (<6H/D) Vegetative Symptoms: None  ADLScreening [facility] Assessment Services) Patient's cognitive ability adequate to safely complete daily activities?: Yes Patient able to express need for assistance with ADLs?: Yes Independently performs ADLs?: Yes  Abuse/Neglect [facility]) Physical Abuse: Denies Verbal Abuse: Denies Sexual Abuse: Denies  Prior Inpatient Therapy Prior Inpatient Therapy: No Prior Therapy Dates: None Prior Therapy Facilty/Provider(s): N/A Reason for Treatment: N/A  Prior Outpatient Therapy Prior Outpatient Therapy: No Prior Therapy Dates: None Prior Therapy  Facilty/Provider(s): None Reason for Treatment: None  ADL Screening (condition at time of admission) Patient's cognitive ability adequate to safely complete daily activities?: Yes Patient able to express need for assistance with ADLs?: Yes Independently performs ADLs?: Yes Weakness of Legs: None Weakness of Arms/Hands: None  Home Assistive Devices/Equipment Home Assistive Devices/Equipment: None    Abuse/Neglect Assessment (Assessment to be complete while patient is alone) Physical Abuse: Denies Verbal Abuse: Denies Sexual Abuse: Denies Exploitation of patient/patient's resources: Denies Self-Neglect: Denies     Merchant navy officer (For Healthcare) Advance Directive: Patient does not have advance directive;Patient would not like information    Additional Information 1:1 In Past 12 Months?: No CIRT Risk: No Elopement Risk: No Does patient have medical clearance?: Yes     Disposition:  Disposition Disposition of Patient: Outpatient treatment;Referred to Type of outpatient treatment: Adult Patient referred to: Other (Comment) (Given referral list for area psychiatrists)  On Site Evaluation by:   Reviewed with Physician:  Sharen Hones, NP   Beatriz Stallion Ray 11/29/2011 6:53 AM

## 2011-12-13 ENCOUNTER — Inpatient Hospital Stay (HOSPITAL_COMMUNITY): Admission: RE | Admit: 2011-12-13 | Payer: Medicare Other | Source: Ambulatory Visit

## 2011-12-13 NOTE — Progress Notes (Signed)
Called patient 12/13/11 at 1430 to remind him of his 12/14/11 0830 AM appointment. Patient has requested this due to his poor short term memory.

## 2011-12-14 ENCOUNTER — Encounter (HOSPITAL_COMMUNITY)
Admission: RE | Admit: 2011-12-14 | Discharge: 2011-12-14 | Disposition: A | Discharge status: Home / Self Care | Payer: Medicare Other | Source: Ambulatory Visit | Attending: Neurology | Admitting: Neurology

## 2011-12-14 ENCOUNTER — Encounter (HOSPITAL_COMMUNITY): Payer: Self-pay

## 2011-12-14 DIAGNOSIS — G35 Multiple sclerosis: Secondary | ICD-10-CM | POA: Insufficient documentation

## 2011-12-14 MED ORDER — SODIUM CHLORIDE 0.9 % IV SOLN
300.0000 mg | INTRAVENOUS | Status: DC
  Administered 2011-12-14: 300 mg via INTRAVENOUS
  Filled 2011-12-14: qty 15

## 2011-12-14 MED ORDER — SODIUM CHLORIDE 0.9 % IV SOLN
INTRAVENOUS | Status: DC
  Administered 2011-12-14: 09:00:00 via INTRAVENOUS

## 2011-12-14 MED ORDER — LORATADINE 10 MG PO TABS
10.0000 mg | ORAL_TABLET | Freq: Once | ORAL | Status: AC
  Administered 2011-12-14: 10 mg via ORAL
  Filled 2011-12-14: qty 1

## 2011-12-14 MED ORDER — ACETAMINOPHEN 500 MG PO TABS
1000.0000 mg | ORAL_TABLET | ORAL | Status: DC
Start: 2011-12-14 — End: 2011-12-15
  Administered 2011-12-14: 1000 mg via ORAL
  Filled 2011-12-14: qty 2

## 2011-12-14 NOTE — Progress Notes (Signed)
Patient does not want to stay for one hour post tysabri infusion. IV discontinued and patient leaves facility.

## 2012-01-11 ENCOUNTER — Encounter (HOSPITAL_COMMUNITY)
Admission: RE | Admit: 2012-01-11 | Discharge: 2012-01-11 | Disposition: A | Discharge status: Home / Self Care | Payer: Medicare Other | Source: Ambulatory Visit | Attending: Neurology | Admitting: Neurology

## 2012-01-11 DIAGNOSIS — G35 Multiple sclerosis: Secondary | ICD-10-CM | POA: Insufficient documentation

## 2012-01-18 ENCOUNTER — Encounter (HOSPITAL_COMMUNITY): Admission: RE | Admit: 2012-01-18 | Payer: Medicare Other | Source: Ambulatory Visit

## 2012-01-25 ENCOUNTER — Other Ambulatory Visit: Payer: Self-pay | Admitting: Neurology

## 2012-01-25 DIAGNOSIS — F5102 Adjustment insomnia: Secondary | ICD-10-CM

## 2012-01-25 DIAGNOSIS — Z79899 Other long term (current) drug therapy: Secondary | ICD-10-CM

## 2012-01-25 DIAGNOSIS — G35 Multiple sclerosis: Secondary | ICD-10-CM

## 2012-01-25 DIAGNOSIS — N529 Male erectile dysfunction, unspecified: Secondary | ICD-10-CM

## 2012-01-31 ENCOUNTER — Ambulatory Visit
Admission: RE | Admit: 2012-01-31 | Discharge: 2012-01-31 | Disposition: A | Discharge status: Home / Self Care | Payer: Medicare Other | Source: Ambulatory Visit | Attending: Neurology | Admitting: Neurology

## 2012-01-31 DIAGNOSIS — F5102 Adjustment insomnia: Secondary | ICD-10-CM

## 2012-01-31 DIAGNOSIS — N529 Male erectile dysfunction, unspecified: Secondary | ICD-10-CM

## 2012-01-31 DIAGNOSIS — Z79899 Other long term (current) drug therapy: Secondary | ICD-10-CM

## 2012-01-31 DIAGNOSIS — G35 Multiple sclerosis: Secondary | ICD-10-CM

## 2012-01-31 MED ORDER — GADOBENATE DIMEGLUMINE 529 MG/ML IV SOLN
17.0000 mL | Freq: Once | INTRAVENOUS | Status: AC | PRN
  Administered 2012-01-31: 17 mL via INTRAVENOUS

## 2012-10-09 ENCOUNTER — Encounter: Payer: Self-pay | Admitting: Neurology

## 2012-10-09 DIAGNOSIS — F329 Major depressive disorder, single episode, unspecified: Secondary | ICD-10-CM

## 2012-10-09 DIAGNOSIS — I10 Essential (primary) hypertension: Secondary | ICD-10-CM | POA: Insufficient documentation

## 2012-10-09 DIAGNOSIS — F32A Depression, unspecified: Secondary | ICD-10-CM | POA: Insufficient documentation

## 2012-10-09 DIAGNOSIS — R531 Weakness: Secondary | ICD-10-CM

## 2012-10-10 ENCOUNTER — Encounter: Payer: Self-pay | Admitting: Neurology

## 2012-10-10 ENCOUNTER — Ambulatory Visit (INDEPENDENT_AMBULATORY_CARE_PROVIDER_SITE_OTHER): Payer: Medicare Other | Admitting: Neurology

## 2012-10-10 VITALS — BP 140/60 | HR 72 | Ht 68.5 in | Wt 176.0 lb

## 2012-10-10 DIAGNOSIS — I10 Essential (primary) hypertension: Secondary | ICD-10-CM

## 2012-10-10 DIAGNOSIS — F3289 Other specified depressive episodes: Secondary | ICD-10-CM

## 2012-10-10 DIAGNOSIS — R5381 Other malaise: Secondary | ICD-10-CM

## 2012-10-10 DIAGNOSIS — R5383 Other fatigue: Secondary | ICD-10-CM

## 2012-10-10 DIAGNOSIS — R531 Weakness: Secondary | ICD-10-CM

## 2012-10-10 DIAGNOSIS — G35 Multiple sclerosis: Secondary | ICD-10-CM

## 2012-10-10 DIAGNOSIS — F329 Major depressive disorder, single episode, unspecified: Secondary | ICD-10-CM

## 2012-10-10 DIAGNOSIS — G35D Multiple sclerosis, unspecified: Secondary | ICD-10-CM

## 2012-10-10 DIAGNOSIS — F32A Depression, unspecified: Secondary | ICD-10-CM

## 2012-10-10 NOTE — Progress Notes (Signed)
History of Present Illness:  [name]  is a 32 year old right-handed African American male, with RRMS  He presented with gait difficulty, numbness from neck down in August 2010, diagnosis was confirmed by abnormal MRI  brain, and cervical, laboratory evaluation had ruled out mimics, including normal or negative CMP, CBC, Lyme titer, anticardiolipin antibody, ANA, RPR, ESR, ACE level. His myelopathic symptoms improved with IV steroid, he was started on Rebif, but he complains injection site reaction.  He had a second major flareup with right optic neuritis, he has gained marked recovery after IV steroids. He was later switched to Betaseron, he tolerated it much better.  But he continued to have gait difficulty, severe constipation, heat sensitivity, extreme fatigue, also short-term memory trouble.  Repeat MRI of brain and cervical in March 2012, showed multiple periventricular, subcortical and corpus callosal white matter lesions typical for demyelinating disease. There is faint ring enhancement of right frontal and left medial frontal lesions suggesting active inflammation. The presence of several T 1black holes suggest chronic disease. Significant disease progresion with increased number, size of lesions and enhancing lesions compared with MRI in 12/25/08. This MRI scan of the cervical spine shows ill defined spinal cord hyperintensities at C4-5 and T1, most likely remote age demyelinating plaques.  He was started on Tysarbri since May,2012, JV virus antibody was negative. he tolerated the infusion very well, but a week after the infusion, each time, he had episode of shivering, feeling excessive fatigue, lasting couple hours, in addition he complains of lack of stamina, fatigue all the time, also has short-term memory trouble, agitated easily, Tysarbri antibody was negative in June  2012, normal CBC< CMP  Last IV Silvestre Moment was in August 2013, his MS symptoms was stable, he continued to combat with his  depression, he was offered poTecfidera or Gilenya treatment, potential side effects reviewed,    He is not taking any disease modifying medications, rely on marijuana to treat his depression, and   fatigue syndrome, I have reviewed 01/2012 MRI of the brain, and cervical spine with him, which showed multiple supratentorium, cervical spine lesions, no contrast enhancement, no significant changes compared to MRI in April 2013, I have suggested disease modifying medications, but he denies it, worry about the side effects, decided to continue on his marijuana only  Wife is with him today,confirmed that he is overall doing well.    Physical Exam  Neck: supple no carotid bruits Respiratory: clear to auscultation bilaterally Cardiovascular: regular rate rhythm  Neurologic Exam  Mental Status: depressed looking young male, awake, alert, cooperative to history, talking, and casual conversation. Cranial Nerves: CN II-XII pupils were equal round reactive to light.  Fundi were sharp bilaterally.  Extraocular movements were full.  Visual fields were full on confrontational test.  Facial sensation and strength were normal.  Hearing was intact to finger rubbing bilaterally.  Uvula tongue were midline.  Head turning and shoulder shrugging were normal and symmetric.  Tongue protrusion into the cheeks strength were normal.  Motor: mild bilateral lower extremity spasticity, no significant weakness Sensory: Normal to light touch, Coordination: There was no dysmetria noticed. Gait and Station:  spastic, mildly unsteady gait,  has difficulty performing tandem walking  Romberg sign: Negative Reflexes: Deep tendon reflexes: Biceps: 1/1, Brachioradialis: 1/1, Triceps: 1/1, Pateller: 2+/2+, Achilles: 2/2.  Plantar responses are flexor.   Assessment and Plan:  32 year old [place] American male with history of relapsing remitting multiple sclerosis, with lesionis involving brain and cervical. He is off all treatment now  since August 2013, using marijuna to control his depression, fatigue, despite my effort in convincing him to take disease modify medications  1. Repeat MRI brain, cervical w/wo  2. RTC in one month to review films

## 2012-10-22 ENCOUNTER — Other Ambulatory Visit: Payer: Medicare Other

## 2012-11-26 ENCOUNTER — Telehealth: Payer: Self-pay | Admitting: Neurology

## 2012-11-26 NOTE — Telephone Encounter (Signed)
This is another message that has been address previously.

## 2012-11-26 NOTE — Telephone Encounter (Signed)
Pt here, up front wanting to get steroid treatment for his MS exacerbation.  He stated that he called twice.  Looking at calls, he called [phone] and then again [phone].  I relayed that will check with Dr. Terrace Arabia, but she had full schedule today and it is better for him to wait for call back, we are not an urgent care facility.  He seemed high strung, agitated, with this, because he stated he needed steroids, for his MS (heat worsens these sx).  He was having blurry vision (bilaterally L>R), having racing thoughts, bilateral feet and hands numbness/tingling.  Is not on any MS therapy.   He is using marijuana he states.  I relayed to Dr.Yan and is not able to see him today.  I relayed to pt and offered that she will call him, [phone] re: his sx and treatment.   Has not had MRI's yet, stating is going to wait until after summer. They were scheduled 10-22-12 but he called and cancelled.

## 2012-11-26 NOTE — Telephone Encounter (Signed)
Chart reviewed, last office visit was June 12th, please let him have MRI first and follow up app afterwards, will not treat his symptoms now, steroid might excerbate his MS symptoms.

## 2012-11-27 ENCOUNTER — Telehealth: Payer: Self-pay | Admitting: Neurology

## 2012-11-27 ENCOUNTER — Encounter (HOSPITAL_COMMUNITY): Payer: Self-pay | Admitting: Emergency Medicine

## 2012-11-27 ENCOUNTER — Emergency Department (HOSPITAL_COMMUNITY)
Admission: EM | Admit: 2012-11-27 | Discharge: 2012-11-27 | Disposition: A | Discharge status: Home / Self Care | Payer: Medicare Other | Attending: Emergency Medicine | Admitting: Emergency Medicine

## 2012-11-27 DIAGNOSIS — R112 Nausea with vomiting, unspecified: Secondary | ICD-10-CM | POA: Insufficient documentation

## 2012-11-27 DIAGNOSIS — R55 Syncope and collapse: Secondary | ICD-10-CM | POA: Insufficient documentation

## 2012-11-27 DIAGNOSIS — R0602 Shortness of breath: Secondary | ICD-10-CM | POA: Insufficient documentation

## 2012-11-27 DIAGNOSIS — Z8669 Personal history of other diseases of the nervous system and sense organs: Secondary | ICD-10-CM | POA: Insufficient documentation

## 2012-11-27 DIAGNOSIS — R5381 Other malaise: Secondary | ICD-10-CM | POA: Insufficient documentation

## 2012-11-27 DIAGNOSIS — G35 Multiple sclerosis: Secondary | ICD-10-CM | POA: Insufficient documentation

## 2012-11-27 DIAGNOSIS — R079 Chest pain, unspecified: Secondary | ICD-10-CM | POA: Insufficient documentation

## 2012-11-27 DIAGNOSIS — I10 Essential (primary) hypertension: Secondary | ICD-10-CM | POA: Insufficient documentation

## 2012-11-27 DIAGNOSIS — R509 Fever, unspecified: Secondary | ICD-10-CM | POA: Insufficient documentation

## 2012-11-27 DIAGNOSIS — H538 Other visual disturbances: Secondary | ICD-10-CM | POA: Insufficient documentation

## 2012-11-27 DIAGNOSIS — R5383 Other fatigue: Secondary | ICD-10-CM | POA: Insufficient documentation

## 2012-11-27 DIAGNOSIS — R197 Diarrhea, unspecified: Secondary | ICD-10-CM | POA: Insufficient documentation

## 2012-11-27 DIAGNOSIS — R51 Headache: Secondary | ICD-10-CM | POA: Insufficient documentation

## 2012-11-27 DIAGNOSIS — E119 Type 2 diabetes mellitus without complications: Secondary | ICD-10-CM | POA: Insufficient documentation

## 2012-11-27 DIAGNOSIS — IMO0002 Reserved for concepts with insufficient information to code with codable children: Secondary | ICD-10-CM | POA: Insufficient documentation

## 2012-11-27 DIAGNOSIS — F17200 Nicotine dependence, unspecified, uncomplicated: Secondary | ICD-10-CM | POA: Insufficient documentation

## 2012-11-27 DIAGNOSIS — Z8659 Personal history of other mental and behavioral disorders: Secondary | ICD-10-CM | POA: Insufficient documentation

## 2012-11-27 MED ORDER — METHYLPREDNISOLONE (PAK) 4 MG PO TABS: ORAL_TABLET | ORAL | Status: DC

## 2012-11-27 MED ORDER — SODIUM CHLORIDE 0.9 % IV SOLN
1000.0000 mg | INTRAVENOUS | Status: AC
  Administered 2012-11-27: 1000 mg via INTRAVENOUS
  Filled 2012-11-27 (×2): qty 8

## 2012-11-27 NOTE — Telephone Encounter (Signed)
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

## 2012-11-27 NOTE — ED Notes (Signed)
Patient stated smokes marijuana occasional.

## 2012-11-27 NOTE — ED Provider Notes (Signed)
Medical screening examination/treatment/procedure(s) were performed by non-physician practitioner and as supervising physician I was immediately available for consultation/collaboration.  Bralin Garry L Demarie Hyneman, MD 11/27/12 1940 

## 2012-11-27 NOTE — ED Notes (Addendum)
Patient stated having MS 'hug', chills, crazy thought, forgetfulness,  not thinking straight, Gen body pain and tightness to chest walls. Denies any chest pain.

## 2012-11-27 NOTE — ED Provider Notes (Signed)
  CSN: [id number]     Arrival date & time 11/27/12  1120 History     First MD Initiated Contact with Patient 11/27/12 1140     Chief Complaint  Patient presents with  . Multiple Sclerosis   (Consider location/radiation/quality/duration/timing/severity/associated sxs/prior Treatment) HPI Patient presents emergency department with exacerbation of his multiple sclerosis.  Patient, states, that this started about 3-4 days, ago.  Patient is not very cooperative and seems very hostile when I ask questions.  Patient, states, that I need to search on Google if I have any further questions about MS. .  Patient, states the shortness of breath, nausea, vomiting, diarrhea, headache, blurred vision, chest pain, fever, or syncope.  Patient, states he has chronic weakness.  Patient, states, that he went his neurologist, and they did not want to see him.  He tells me he does not want to get pain medication because he uses pot to get high. Past Medical History  Diagnosis Date  . Multiple sclerosis   . Weakness     generalized  . Eye abnormalities     unspecified by pt  . Hypertension   . Depression   . Diabetes mellitus without complication    History reviewed. No pertinent past surgical history. Family History  Problem Relation Age of Onset  . Adopted: Yes  . Multiple sclerosis Cousin    History  Substance Use Topics  . Smoking status: Current Every Day Smoker -- 1.00 packs/day    Types: Cigarettes  . Smokeless tobacco: Never Used  . Alcohol Use: 7.2 oz/week    12 Cans of beer per week    Review of Systems Level V caveat applies due to uncooperativeness.  Patient will not answer all the questions Allergies  Review of patient's allergies indicates no known allergies.  Home Medications  No current outpatient prescriptions on file. BP 153/82  Pulse 64  Temp(Src) 98.7 F (37.1 C) (Oral)  Resp 18  SpO2 100% Physical Exam  Nursing note and vitals reviewed. Constitutional: He is oriented  to person, place, and time. He appears well-developed and well-nourished. No distress.  HENT:  Head: Normocephalic and atraumatic.  Mouth/Throat: Oropharynx is clear and moist.  Eyes: Pupils are equal, round, and reactive to light.  Neck: Normal range of motion. Neck supple.  Cardiovascular: Normal rate, regular rhythm and normal heart sounds.  Exam reveals no gallop and no friction rub.   No murmur heard. Pulmonary/Chest: Effort normal and breath sounds normal. No respiratory distress.  Neurological: He is alert and oriented to person, place, and time. He exhibits normal muscle tone. Coordination normal.  Skin: Skin is warm and dry. No rash noted.  Psychiatric: His affect is angry, blunt and inappropriate. His speech is tangential. He is agitated.    ED Course   Procedures (including critical care time) Patient be referred back to his neurologist.  He is given IV Solu-Medrol, he will be placed on oral course of steroids, as well.   MDM    Carlyle Dolly, PA-C 11/27/12 1440

## 2012-11-27 NOTE — Telephone Encounter (Signed)
I called pt to let him know that Dr. Terrace Arabia can give him xanax for assisting with MRI.   Pt informed me that he was in the hospital now getting IV steroids.  I related to him that I was glad he was getting the treatment he wanted and hoped it would help.

## 2012-11-27 NOTE — Telephone Encounter (Addendum)
I called [name] and relayed the message.  Need to have MRI's to assess and then treat.  He hung up, he stated that he knows that he needs the steroids, heat makes things worse and he does not want to get to where he cannot walk.  I spoke to [facility], in MRI and let her know about MRI auth for this [name].  She will look hhim up. And I gave her the mobile # for wife to sechedule.   [name] called back and reiterated firmly that he wanted and needed steroids since he knew he had MS exacerbation.   I informed him that Dr. Terrace Arabia was not going to prescribe steroids, and that he would need to have MRI's to see if having active lesions.  He was not happy with this, and may go to urgent care or ER.  He said thank you.

## 2012-11-27 NOTE — ED Notes (Signed)
20G IV cath removed from left AC. IV cath intact upon removal. 2x2 and tape secured site upon removal. No bleeding.

## 2012-11-27 NOTE — Telephone Encounter (Signed)
Received call from [place],. With [facility] asking about getting MRI and needing something to assist with the shaking that he is having, which will interfer with MRI.  Will send message to Dr Terrace Arabia. Xanax?  Or at [facility] conscious sedation?

## 2012-12-25 ENCOUNTER — Ambulatory Visit
Admission: RE | Admit: 2012-12-25 | Discharge: 2012-12-25 | Disposition: A | Discharge status: Home / Self Care | Payer: Medicare Other | Source: Ambulatory Visit | Attending: Neurology | Admitting: Neurology

## 2012-12-25 DIAGNOSIS — I10 Essential (primary) hypertension: Secondary | ICD-10-CM

## 2012-12-25 DIAGNOSIS — G35 Multiple sclerosis: Secondary | ICD-10-CM

## 2012-12-25 DIAGNOSIS — F32A Depression, unspecified: Secondary | ICD-10-CM

## 2012-12-25 DIAGNOSIS — F329 Major depressive disorder, single episode, unspecified: Secondary | ICD-10-CM

## 2012-12-25 DIAGNOSIS — R531 Weakness: Secondary | ICD-10-CM

## 2012-12-25 MED ORDER — GADOBENATE DIMEGLUMINE 529 MG/ML IV SOLN
16.0000 mL | Freq: Once | INTRAVENOUS | Status: AC | PRN
  Administered 2012-12-25: 16 mL via INTRAVENOUS

## 2012-12-26 ENCOUNTER — Telehealth: Payer: Self-pay | Admitting: Neurology

## 2012-12-26 NOTE — Telephone Encounter (Signed)
Please call patient, MRI brain showed one small left parietal area new lesion, no significant worsening, compared to previous MRI brain.

## 2012-12-31 ENCOUNTER — Telehealth: Payer: Self-pay | Admitting: Neurology

## 2013-01-01 NOTE — Telephone Encounter (Signed)
Left message at [phone] with MRI brain results, per Dr. Yan. 

## 2013-01-01 NOTE — Telephone Encounter (Signed)
Please call patient, there might be a small active lesion at the left side of brain, but Dr. Terrace Arabia have reviewed the film, overall, no significant changes compared to previous study in 01/2012.  There is an enhancing lesion (4mm) in the left parietal region (series 14 image 51, series 16 image 15). May represent an acute demyelinating plaque.  2. Multiple periventricular, pericallosal, subcortical, juxtacortical, midbrain, pontine and cerebellar chronic demyelinating plaques. Some of these are hypointense on T1 views.  3. Compared to MRI on 01/31/12, the left parietal acute demyelinating plaque is a new finding. Otherwise, no change.

## 2013-01-01 NOTE — Telephone Encounter (Signed)
Left message at [phone] with MRI brain results, per Dr. Terrace Arabia.

## 2013-01-03 ENCOUNTER — Telehealth: Payer: Self-pay | Admitting: Neurology

## 2013-01-03 NOTE — Telephone Encounter (Signed)
Called patient.  No answer.

## 2013-01-15 ENCOUNTER — Telehealth: Payer: Self-pay | Admitting: Neurology

## 2013-01-16 ENCOUNTER — Encounter: Payer: Self-pay | Admitting: *Deleted

## 2013-01-16 NOTE — Telephone Encounter (Signed)
Please give him a follow up appt in 2-3 months

## 2013-01-16 NOTE — Telephone Encounter (Signed)
Spoke with pt.  Experiencing constant "sharp" pain in L leg from thigh to ankle (x3 days) that is exacerbated when he walks.  OTC meds are not working.  He has an appointment with his PCP next week and I advised pt to contact his PCP in the interim regarding his current problems.  I assured the pt that I would inform Dr Terrace Arabia of his concerns.  The pt also wanted to know when his next appointment would be with Dr Terrace Arabia.

## 2013-01-21 NOTE — Telephone Encounter (Signed)
Called and spoke to patient he has apt. With Dr.Yan Oct. 1st at 8:30 . Patient understood.

## 2013-01-29 ENCOUNTER — Ambulatory Visit: Payer: Self-pay | Admitting: Neurology

## 2013-10-12 ENCOUNTER — Emergency Department (HOSPITAL_COMMUNITY)
Admission: EM | Admit: 2013-10-12 | Discharge: 2013-10-12 | Disposition: A | Discharge status: Home / Self Care | Payer: Medicare Other | Attending: Emergency Medicine | Admitting: Emergency Medicine

## 2013-10-12 ENCOUNTER — Encounter (HOSPITAL_COMMUNITY): Payer: Self-pay | Admitting: Emergency Medicine

## 2013-10-12 DIAGNOSIS — Z8659 Personal history of other mental and behavioral disorders: Secondary | ICD-10-CM | POA: Insufficient documentation

## 2013-10-12 DIAGNOSIS — G35 Multiple sclerosis: Secondary | ICD-10-CM | POA: Diagnosis not present

## 2013-10-12 DIAGNOSIS — R404 Transient alteration of awareness: Secondary | ICD-10-CM | POA: Diagnosis not present

## 2013-10-12 DIAGNOSIS — I10 Essential (primary) hypertension: Secondary | ICD-10-CM | POA: Insufficient documentation

## 2013-10-12 DIAGNOSIS — E119 Type 2 diabetes mellitus without complications: Secondary | ICD-10-CM | POA: Insufficient documentation

## 2013-10-12 DIAGNOSIS — H5462 Unqualified visual loss, left eye, normal vision right eye: Secondary | ICD-10-CM

## 2013-10-12 DIAGNOSIS — IMO0002 Reserved for concepts with insufficient information to code with codable children: Secondary | ICD-10-CM | POA: Insufficient documentation

## 2013-10-12 DIAGNOSIS — Z760 Encounter for issue of repeat prescription: Secondary | ICD-10-CM | POA: Diagnosis present

## 2013-10-12 DIAGNOSIS — F17200 Nicotine dependence, unspecified, uncomplicated: Secondary | ICD-10-CM | POA: Diagnosis not present

## 2013-10-12 DIAGNOSIS — H5460 Unqualified visual loss, one eye, unspecified: Secondary | ICD-10-CM | POA: Diagnosis not present

## 2013-10-12 MED ORDER — PREDNISONE 20 MG PO TABS: ORAL_TABLET | ORAL | Status: DC

## 2013-10-12 NOTE — ED Notes (Signed)
[name], NT aware of patient

## 2013-10-12 NOTE — Discharge Instructions (Signed)
Please see a neurologist as soon as she can get him to clarify treatments and further evaluation for possible imaging or other recommendations. Return to the ER she change her mind Further evaluation by a neurologist. If you were given medicines take as directed.  If you are on coumadin or contraceptives realize their levels and effectiveness is altered by many different medicines.  If you have any reaction (rash, tongues swelling, other) to the medicines stop taking and see a physician.   Please follow up as directed and return to the ER or see a physician for new or worsening symptoms.  Thank you. Filed Vitals:   10/12/13 1422  BP: 115/64  Pulse: 78  Temp: 98.1 F (36.7 C)  TempSrc: Oral  Resp: 20  SpO2: 98%    Multiple Sclerosis Multiple sclerosis (MS) is a disease of the central nervous system. It leads to loss of the insulating covering of the nerves (myelin sheath) of your brain. When this happens, brain signals do not get transmitted properly or may not get transmitted at all. The symptoms of MS occur in episodes or attacks. These attacks may last weeks to months. There may be long periods of nearly no problems between attacks. The age of onset of MS varies.  CAUSES The cause of MS is unknown. However, it is more common in the [place] than in the [place]. RISK FACTORS There is a higher incidence of MS in women than in men. MS is not an inherited illness, although your risk of MS is higher if you have a relative with MS. SIGNS AND SYMPTOMS  The symptoms of MS occur in episodes or attacks. These attacks may last weeks to months. There may be long periods of almost no symptoms between attacks. The symptoms of MS vary. This is because of the many different ways it affects the central nervous system. The main symptoms of MS include:  Vision problems and eye pain.  Numbness.  Weakness.  Paralysis in your arms, hands, feet, and legs  (extremities).  Balance problems.  Tremors. DIAGNOSIS  Your health care provider can diagnose MS with the help of imaging exams and lab tests. These may include specialized X-ray exams and spinal fluid tests. The best imaging exam to confirm a diagnosis of MS is MRI. TREATMENT  There is no known cure for MS, but there are medicines that can decrease the number and frequency of attacks. Steroids are often used for short-term relief. Physical and occupational therapy may also help. HOME CARE INSTRUCTIONS   Take medicines as directed by your health care provider.  Exercise as directed by your health care provider. SEEK MEDICAL CARE IF: You begin to feel depressed. SEEK IMMEDIATE MEDICAL CARE IF:  You develop paralysis.  You develop problems with bladder, bowel, or sexual function.  You develop mental changes, such as forgetfulness or mood swings.  You have a seizure. Document Released: 04/14/2000 Document Revised: 02/05/2013 Document Reviewed: 12/23/2012 [facility] Patient Information 2014 [place], [place].   Marland Kitchen

## 2013-10-12 NOTE — ED Notes (Addendum)
Pt reports having MS and having symptoms such as difficulty seeing and pains to left thighs. States " I just need some prednisone." no distress noted at triage. Pt ambulatory and airway intact.

## 2013-10-12 NOTE — ED Notes (Signed)
Patient brought to hallway bed

## 2013-10-12 NOTE — ED Notes (Signed)
Pt threatening to walk out

## 2013-10-12 NOTE — ED Notes (Signed)
The pt continues to c/o the wait etc. Ed res aware.  The pt is very unhappy and disruptive to the other hw pt  Beside him

## 2013-10-12 NOTE — ED Notes (Signed)
The pt has been seen by the ed res.  See his notes  No complaints

## 2013-10-12 NOTE — ED Provider Notes (Signed)
CSN: [id number]     Arrival date & time 10/12/13  1400 History   First MD Initiated Contact with Patient 10/12/13 1624     Chief Complaint  Patient presents with  . Medication Refill  . Multiple Sclerosis     (Consider location/radiation/quality/duration/timing/severity/associated sxs/prior Treatment) Patient is a 33 y.o. male presenting with neurologic complaint.  Neurologic Problem This is a new problem. The current episode started in the past 7 days. The problem occurs daily. Associated symptoms include abdominal pain. Pertinent negatives include no chest pain, chills, congestion, coughing, fever, headaches, nausea, neck pain, numbness, rash, vomiting or weakness. Nothing aggravates the symptoms. He has tried nothing for the symptoms.    Past Medical History  Diagnosis Date  . Multiple sclerosis   . Weakness     generalized  . Eye abnormalities     unspecified by pt  . Hypertension   . Depression   . Diabetes mellitus without complication    History reviewed. No pertinent past surgical history. Family History  Problem Relation Age of Onset  . Adopted: Yes  . Multiple sclerosis Cousin    History  Substance Use Topics  . Smoking status: Current Every Day Smoker -- 1.00 packs/day    Types: Cigarettes  . Smokeless tobacco: Never Used  . Alcohol Use: 7.2 oz/week    12 Cans of beer per week    Review of Systems  Constitutional: Negative for fever and chills.  HENT: Negative for congestion and rhinorrhea.   Eyes: Negative for pain.  Respiratory: Negative for cough and shortness of breath.   Cardiovascular: Negative for chest pain and palpitations.  Gastrointestinal: Positive for abdominal pain. Negative for nausea, vomiting, diarrhea and constipation.  Endocrine: Negative for polydipsia and polyuria.  Genitourinary: Negative for dysuria and flank pain.  Musculoskeletal: Negative for back pain and neck pain.  Skin: Negative for color change, rash and wound.   Neurological: Negative for dizziness, weakness, numbness and headaches.      Allergies  Review of patient's allergies indicates no known allergies.  Home Medications   Prior to Admission medications   Medication Sig Start Date End Date Taking? Authorizing Provider  predniSONE (DELTASONE) 20 MG tablet 3 tabs po day one, then 2 tabs daily x 4 days 10/12/13   Enid Skeens, MD   BP 115/64  Pulse 78  Temp(Src) 98.1 F (36.7 C) (Oral)  Resp 16  SpO2 98% Physical Exam  Nursing note and vitals reviewed. Constitutional: He is oriented to person, place, and time. He appears well-developed and well-nourished.  HENT:  Head: Normocephalic and atraumatic.  Eyes: Conjunctivae and EOM are normal. Pupils are equal, round, and reactive to light.  Neck: Normal range of motion.  Cardiovascular: Normal rate and regular rhythm.   Pulmonary/Chest: Effort normal and breath sounds normal.  Abdominal: Soft. He exhibits no distension. There is no tenderness.  Musculoskeletal: Normal range of motion. He exhibits no edema and no tenderness.  Neurological: He is alert and oriented to person, place, and time.  No altered mental status, able to give full seemingly accurate history.  Face is symmetric, EOM's intact, pupils equal and reactive, vision intact, tongue and uvula midline without deviation Upper and Lower extremity motor 5/5, intact pain perception in distal extremities, 2+ reflexes in biceps, patella and achilles tendons. Finger to nose normal, heel to shin normal. Walks without assistance or evident ataxia.   Skin: Skin is warm and dry.    ED Course  Procedures (including critical care time)  Labs Review Labs Reviewed - No data to display  Imaging Review No results found.   EKG Interpretation None      MDM   Final diagnoses:  MS (multiple sclerosis)  Vision loss, left eye    33 yo M w/ MS here with abdominal tightness and tingling similar to previous MS flares. States it  happens every year around this time. Asking for IV steroids and a taper. No GI symptoms, dizziness previously not currently. No other symptoms. Exam benign including neuro exam. Likely MS flare. Will give outpatient rx for prednisone.     Marily MemosJason Atharva Mirsky, MD 10/13/13 [phone]

## 2013-10-16 NOTE — ED Provider Notes (Signed)
Medical screening examination/treatment/procedure(s) were conducted as a shared visit with non-physician practitioner(s) or resident and myself. I personally evaluated the patient during the encounter and agree with the findings and plan unless otherwise indicated.  I have personally reviewed any xrays and/ or EKG's with the provider and I agree with interpretation.  Patient presents to the emergency room with MS flare. Patient's had gradually worsening left vision changes that are similar to his previous. He has no focal weakness. He said he is status multiple times and just need steroids. He has neurology followup O. patient has had imaging the past. ER was busy attenuated for 3 hours and he requested leave.Patient has capacity to make decisions, understands benefits of further evaluation and risks of going home may result in worsening health condition. Patient understands they may return at any time. Patient is walking out the door and I discussed with him that we can at least give him a steroid prescription any to call the neurologist earlier this week for further treatment options. Patient was comfortable the plan. On exam patient has decreased vision left visual fields, pupils equal bilateral, neck supple other cranial nerves grossly intact, normal gait, and equal strength bilateral upper and lower extremities.  MS flare   Enid Skeens, MD 10/16/13 [id number]

## 2013-10-21 DIAGNOSIS — Z0289 Encounter for other administrative examinations: Secondary | ICD-10-CM

## 2014-08-19 DIAGNOSIS — H52213 Irregular astigmatism, bilateral: Secondary | ICD-10-CM | POA: Diagnosis not present

## 2014-08-19 DIAGNOSIS — H5210 Myopia, unspecified eye: Secondary | ICD-10-CM | POA: Diagnosis not present

## 2014-10-01 DIAGNOSIS — E119 Type 2 diabetes mellitus without complications: Secondary | ICD-10-CM | POA: Diagnosis not present

## 2014-10-01 DIAGNOSIS — Z6829 Body mass index (BMI) 29.0-29.9, adult: Secondary | ICD-10-CM | POA: Diagnosis not present

## 2014-10-01 DIAGNOSIS — G35 Multiple sclerosis: Secondary | ICD-10-CM | POA: Diagnosis not present

## 2014-10-01 DIAGNOSIS — E780 Pure hypercholesterolemia: Secondary | ICD-10-CM | POA: Diagnosis not present

## 2014-10-01 DIAGNOSIS — F17200 Nicotine dependence, unspecified, uncomplicated: Secondary | ICD-10-CM | POA: Diagnosis not present

## 2015-02-24 NOTE — Telephone Encounter (Signed)
Error

## 2015-12-25 DIAGNOSIS — Z0100 Encounter for examination of eyes and vision without abnormal findings: Secondary | ICD-10-CM | POA: Diagnosis not present

## 2016-02-22 DIAGNOSIS — R531 Weakness: Secondary | ICD-10-CM | POA: Diagnosis not present

## 2016-02-23 DIAGNOSIS — N200 Calculus of kidney: Secondary | ICD-10-CM | POA: Diagnosis not present

## 2016-02-23 DIAGNOSIS — Z720 Tobacco use: Secondary | ICD-10-CM | POA: Diagnosis not present

## 2016-02-23 DIAGNOSIS — R200 Anesthesia of skin: Secondary | ICD-10-CM | POA: Diagnosis not present

## 2016-02-23 DIAGNOSIS — G35 Multiple sclerosis: Secondary | ICD-10-CM | POA: Diagnosis not present

## 2016-04-05 DIAGNOSIS — Z6825 Body mass index (BMI) 25.0-25.9, adult: Secondary | ICD-10-CM | POA: Diagnosis not present

## 2016-04-05 DIAGNOSIS — Z1389 Encounter for screening for other disorder: Secondary | ICD-10-CM | POA: Diagnosis not present

## 2016-04-05 DIAGNOSIS — G35 Multiple sclerosis: Secondary | ICD-10-CM | POA: Diagnosis not present

## 2016-04-05 DIAGNOSIS — I10 Essential (primary) hypertension: Secondary | ICD-10-CM | POA: Diagnosis not present

## 2016-04-05 DIAGNOSIS — R269 Unspecified abnormalities of gait and mobility: Secondary | ICD-10-CM | POA: Diagnosis not present

## 2016-04-06 ENCOUNTER — Telehealth: Payer: Self-pay

## 2016-04-06 DIAGNOSIS — R269 Unspecified abnormalities of gait and mobility: Secondary | ICD-10-CM | POA: Diagnosis not present

## 2016-04-06 DIAGNOSIS — I10 Essential (primary) hypertension: Secondary | ICD-10-CM | POA: Diagnosis not present

## 2016-04-06 NOTE — Telephone Encounter (Signed)
Pt called to check on a referral sent over by his PCP. Saw Dr. Terrace Arabia back in 2014 for MS. Reports that he is currently having a "flare-up." Would like to schedule appt for new evaluation as soon as possible.

## 2016-04-17 ENCOUNTER — Ambulatory Visit: Payer: Commercial Managed Care - HMO | Admitting: Neurology

## 2016-05-10 ENCOUNTER — Encounter: Payer: Self-pay | Admitting: Neurology

## 2016-05-10 ENCOUNTER — Ambulatory Visit (INDEPENDENT_AMBULATORY_CARE_PROVIDER_SITE_OTHER): Payer: Medicare HMO | Admitting: Neurology

## 2016-05-10 VITALS — BP 137/85 | HR 90 | Ht 70.0 in | Wt 180.0 lb

## 2016-05-10 DIAGNOSIS — R799 Abnormal finding of blood chemistry, unspecified: Secondary | ICD-10-CM | POA: Diagnosis not present

## 2016-05-10 DIAGNOSIS — Z79899 Other long term (current) drug therapy: Secondary | ICD-10-CM | POA: Diagnosis not present

## 2016-05-10 DIAGNOSIS — E559 Vitamin D deficiency, unspecified: Secondary | ICD-10-CM | POA: Diagnosis not present

## 2016-05-10 DIAGNOSIS — R269 Unspecified abnormalities of gait and mobility: Secondary | ICD-10-CM

## 2016-05-10 DIAGNOSIS — G35 Multiple sclerosis: Secondary | ICD-10-CM

## 2016-05-10 DIAGNOSIS — R260 Ataxic gait: Secondary | ICD-10-CM | POA: Diagnosis not present

## 2016-05-10 NOTE — Progress Notes (Signed)
PATIENT: [name] DOB: 1980/05/29  Chief Complaint  Patient presents with  . Multiple Sclerosis    He was last seen here 10/10/12.  He is concerned about the following:  frequent/urgent urination, temperature sensitivity, weakness and gait difficulty.  He has been on Tysabri, Rebif and Beteseron in the past.  States he is smoking Marijuana daily for his MS now.     HISTORICAL  [name] is a 36 years old right-handed male, seen in refer by his primary care physician PA Cyndi Bender for continued care of his relapsing remitting multiple sclerosis.  I saw him last in December 2013, he presented with gait abnormality numbness from neck down in August 2010, relapsing remitting multiple sclerosis was diagnosed based on abnormal MRI of the brain and cervical spine, extensive laboratory evaluation was able to rule out mimics, including normal or negative CMP CBC Lyme titer, anticardiolipin antibodies, ANA, RPR, ESR, Ace level, his LAD (with symptoms improved with IV steroid, he was initially treated with [facility], complains of flulike illness, injection site reaction.  Section flareup was left optic neuritis, he gained most recovery with IV steroid, but continue have mild blurry vision at his left eye. He was treated with Betaseron for short period of time, he could not tolerate it either.   At baseline, he has mild gait abnormality, constipation, heat intolerance, extreme fatigue, short-term memory trouble, he was treated with Dwyane Dee since May 2012, last iv Tysarbri infusion was August 2013, he is JC virus antibody was negative, anti-Tysarbri antibody was negative, he complains of episode of shivering, extreme fatigue, after each infusion, decided to go without treatment since 2013.  Instead he was smoking marijuana every day,  I personally reviewed MRI of the brain with without contrast August 2014, enhancing lesion in the left parietal region, multiple periventricular, pericallosal,  subcortical, juxtacortical, midbrain, pontine, cerebellar chronic demyelinating plaques, the left parietal enhancing lesion is a new finding compared to MRI scan in 2013.  Most recent MRI cervical spine October 2013, multiple scattered short segment demyelinating plaque throughout C3 to upper thoracic T2 level.  He has lost follow-up since last visit in December 2013, today he came in with his mother and on, he has been smoking marijuana multiple dose daily, decided to go all [place], actually he went on a trip around September 2017  to [place] to try more legal marijuana, around that time, he noticed worsening gait abnormality, fatigue,  Today he came he wants to get IV infusion for his flareup, which has been ongoing for more than 5 months, he has separated from his wife and children. Now lives with his mother  REVIEW OF SYSTEMS: Full 14 system review of systems performed and notable only for as above  ALLERGIES: No Known Allergies  HOME MEDICATIONS: No current outpatient prescriptions on file.   No current facility-administered medications for this visit.     PAST MEDICAL HISTORY: Past Medical History:  Diagnosis Date  . Depression   . Diabetes mellitus without complication ([place])   . Eye abnormalities    unspecified by pt  . Hypertension   . Multiple sclerosis ([place])   . Weakness    generalized    PAST SURGICAL HISTORY: History reviewed. No pertinent surgical history.  FAMILY HISTORY: Family History  Problem Relation Age of Onset  . Adopted: Yes  . Multiple sclerosis Cousin   . Heart disease Mother   . Other Father     Does not know his father.  SOCIAL HISTORY:  Social History   Social History  . Marital status: Married    Spouse name: N/A  . Number of children: 2  . Years of education: 12   Occupational History  . Not on file.   Social History Main Topics  . Smoking status: Current Every Day Smoker    Packs/day: 1.00    Types: Cigarettes  . Smokeless  tobacco: Never Used  . Alcohol use 7.2 oz/week    12 Cans of beer per week  . Drug use:     Frequency: 30.0 times per week    Types: Marijuana     Comment: Daily  . Sexual activity: Yes   Other Topics Concern  . Not on file   Social History Narrative   Patient lives at home with his wife and children.    Patient is presently not working. Patient has a high school education.   Adopted.   Right-handed.   At least 8 cups caffeine daily.        PHYSICAL EXAM   Vitals:   05/10/16 0942  BP: 137/85  Pulse: 90  Weight: 180 lb (81.6 kg)  Height: _0  (1.778 m)    Not recorded      Body mass index is 25.83 kg/m.  PHYSICAL EXAMNIATION:  Gen: NAD, conversant, well nourised, obese, well groomed                     Cardiovascular: Regular rate rhythm, no peripheral edema, warm, nontender. Eyes: Conjunctivae clear without exudates or hemorrhage Neck: Supple, no carotid bruits. Pulmonary: Clear to auscultation bilaterally   NEUROLOGICAL EXAM:  MENTAL STATUS: Speech:    Speech is normal; fluent and spontaneous with normal comprehension.  Cognition:     Orientation to time, place and person     Normal recent and remote memory     Normal Attention span and concentration     Normal Language, naming, repeating,spontaneous speech     Fund of knowledge   CRANIAL NERVES: CN II: Visual fields are full to confrontation. Fundoscopic exam is normal with sharp discs and no vascular changes. Pupils are round equal and briskly reactive to light. CN III, IV, VI: extraocular movement are normal. No ptosis. CN V: Facial sensation is intact to pinprick in all 3 divisions bilaterally. Corneal responses are intact.  CN VII: Face is symmetric with normal eye closure and smile. CN VIII: Hearing is normal to rubbing fingers CN IX, X: Palate elevates symmetrically. Phonation is normal. CN XI: Head turning and shoulder shrug are intact CN XII: Tongue is midline with normal movements and no  atrophy.  MOTOR: He has moderate spasticity of bilateral lower extremity, mild to moderate bilateral hip flexion weakness,  REFLEXES: Reflexes are 2+ and symmetric at the biceps, triceps, knees, and ankles. Plantar responses are flexor.  SENSORY: Intact to light touch, pinprick, positional sensation and vibratory sensation are intact in fingers and toes.  COORDINATION: Rapid alternating movements and fine finger movements are intact. There is no dysmetria on finger-to-nose and heel-knee-shin.    GAIT/STANCE: He needs push up to get up from seated position, wide based, cautious, unsteady gait   DIAGNOSTIC DATA (LABS, IMAGING, TESTING) - I reviewed patient records, labs, notes, testing and imaging myself where available.   ASSESSMENT AND PLAN  [name] is a 36 y.o. male   Relapsing remitting multiple sclerosis with significant cervical spine involvement   Has been off long term immunomodulation therapy since 2013,  Worsening gait abnormality since September 2017  Repeat MRI of the brain, cervical spine with and without contrast  Laboratory evaluations, including UA to rule out active infection  He has worsening gait abnormality for extended period of time, less likely will benefit high dose of IV steroid treatment,  I have suggested a mining times, he would benefit long term immunomodulation therapy to decrease relapsing remitting episode, preserved functional status.    Marcial Pacas, M.D. Ph.D.  [facility] [address], [place], [place] [address] Ph: [phone] Fax: [phone]  CC: Referring Provider

## 2016-05-11 LAB — COMPREHENSIVE METABOLIC PANEL
ALK PHOS: 59 IU/L (ref 39–117)
ALT: 24 IU/L (ref 0–44)
AST: 16 IU/L (ref 0–40)
Albumin/Globulin Ratio: 1.4 (ref 1.2–2.2)
Albumin: 4.2 g/dL (ref 3.5–5.5)
BUN/Creatinine Ratio: 8 — ABNORMAL LOW (ref 9–20)
BUN: 7 mg/dL (ref 6–20)
Bilirubin Total: 0.3 mg/dL (ref 0.0–1.2)
CALCIUM: 9.3 mg/dL (ref 8.7–10.2)
CO2: 24 mmol/L (ref 18–29)
CREATININE: 0.85 mg/dL (ref 0.76–1.27)
Chloride: 104 mmol/L (ref 96–106)
GFR calc Af Amer: 131 mL/min/{1.73_m2} (ref >59)
GFR calc non Af Amer: 113 mL/min/{1.73_m2} (ref >59)
GLOBULIN, TOTAL: 2.9 g/dL (ref 1.5–4.5)
GLUCOSE: 85 mg/dL (ref 65–99)
Potassium: 4.4 mmol/L (ref 3.5–5.2)
SODIUM: 142 mmol/L (ref 134–144)
Total Protein: 7.1 g/dL (ref 6.0–8.5)

## 2016-05-11 LAB — CBC WITH DIFFERENTIAL
Basophils Absolute: 0 10*3/uL (ref 0.0–0.2)
Basos: 1 %
EOS (ABSOLUTE): 0.1 10*3/uL (ref 0.0–0.4)
Eos: 2 %
Hematocrit: 39.2 % (ref 37.5–51.0)
Hemoglobin: 13.4 g/dL (ref 13.0–17.7)
IMMATURE GRANULOCYTES: 0 %
Immature Grans (Abs): 0 10*3/uL (ref 0.0–0.1)
LYMPHS ABS: 1.9 10*3/uL (ref 0.7–3.1)
Lymphs: 43 %
MCH: 29.2 pg (ref 26.6–33.0)
MCHC: 34.2 g/dL (ref 31.5–35.7)
MCV: 85 fL (ref 79–97)
MONOS ABS: 0.4 10*3/uL (ref 0.1–0.9)
Monocytes: 8 %
NEUTROS PCT: 46 %
Neutrophils Absolute: 2 10*3/uL (ref 1.4–7.0)
RBC: 4.59 x10E6/uL (ref 4.14–5.80)
RDW: 14.9 % (ref 12.3–15.4)
WBC: 4.4 10*3/uL (ref 3.4–10.8)

## 2016-05-11 LAB — FOLATE: Folate: 14.2 ng/mL (ref >3.0)

## 2016-05-11 LAB — URINALYSIS
Bilirubin, UA: NEGATIVE
GLUCOSE, UA: NEGATIVE
Ketones, UA: NEGATIVE
LEUKOCYTES UA: NEGATIVE
Nitrite, UA: NEGATIVE
Protein, UA: NEGATIVE
RBC, UA: NEGATIVE
Specific Gravity, UA: 1.027 (ref 1.005–1.030)
UUROB: 1 mg/dL (ref 0.2–1.0)
pH, UA: 6 (ref 5.0–7.5)

## 2016-05-11 LAB — VITAMIN B12: VITAMIN B 12: 526 pg/mL (ref 232–1245)

## 2016-05-11 LAB — RPR: RPR: NONREACTIVE

## 2016-05-11 LAB — TSH: TSH: 0.764 u[IU]/mL (ref 0.450–4.500)

## 2016-05-11 LAB — HIV ANTIBODY (ROUTINE TESTING W REFLEX): HIV Screen 4th Generation wRfx: NONREACTIVE

## 2016-05-11 LAB — VITAMIN D 25 HYDROXY (VIT D DEFICIENCY, FRACTURES): VIT D 25 HYDROXY: 29.1 ng/mL — AB (ref 30.0–100.0)

## 2016-05-19 ENCOUNTER — Other Ambulatory Visit: Payer: Medicare Other

## 2016-05-22 DIAGNOSIS — G35 Multiple sclerosis: Secondary | ICD-10-CM | POA: Diagnosis not present

## 2016-05-23 ENCOUNTER — Ambulatory Visit (INDEPENDENT_AMBULATORY_CARE_PROVIDER_SITE_OTHER): Payer: Self-pay

## 2016-05-23 DIAGNOSIS — G35 Multiple sclerosis: Secondary | ICD-10-CM | POA: Diagnosis not present

## 2016-05-23 DIAGNOSIS — Z0289 Encounter for other administrative examinations: Secondary | ICD-10-CM

## 2016-05-23 DIAGNOSIS — R269 Unspecified abnormalities of gait and mobility: Secondary | ICD-10-CM

## 2016-05-25 ENCOUNTER — Telehealth: Payer: Self-pay | Admitting: *Deleted

## 2016-05-25 MED ORDER — PREDNISONE 10 MG PO TABS: ORAL_TABLET | ORAL | 0 refills | Status: DC

## 2016-05-25 NOTE — Telephone Encounter (Signed)
Received call from patient who stated he was returning a call from this office. Advised him this RN did not see any notes of a phone call to him today. He stated he had called about receiving steroids. He stated "It's even uncomfortable to wear my shoes." Advised him will send this note to Dr Zannie Cove nurse. He verbalized understanding, appreciation.

## 2016-05-25 NOTE — Addendum Note (Signed)
Addended by: Stephanie Acre on: 05/25/2016 07:17 PM   Modules accepted: Orders

## 2016-05-25 NOTE — Telephone Encounter (Signed)
Notes Recorded by Huston Foley, MD on 05/25/2016 at 1:16 PM EST Cervical spine MRI and brain MRI showed findings in keeping with chronic multiple sclerosis, no new findings, no new demyelination or plaques. Cervical spine MRI is reported to be stable from 2013 and brain MRI stable from 2014, which is reassuring. Please notify patient, FU with Dr. Terrace Arabia as planned.      Spoke to patient - aware of results.  He is concerned about increased tingling/numbness throughout his body, especially in his bilateral arms/hands. States his gait has worsened and he has fallen multiple times over the last month.  He is requesting treatment with IV steroids for his symptoms.  States IV steroids have been beneficial for him in the past.  His best contact number is [phone].

## 2016-05-25 NOTE — Progress Notes (Signed)
Cervical spine MRI and brain MRI showed findings in keeping with chronic multiple sclerosis, no new findings, no new demyelination or plaques. Cervical spine MRI is reported to be stable from 2013 and brain MRI stable from 2014, which is reassuring. Please notify patient, FU with Dr. Terrace Arabia as planned.

## 2016-05-25 NOTE — Telephone Encounter (Signed)
I called patient. I left a message. The patient has apparent had some change in his ability to ambulate, he has had several falls.  I will initiate treatment with oral prednisone, if the patient continues to do poorly, he may require IV steroid treatment. He is not on any disease modifying medications for his multiple sclerosis.

## 2016-05-26 NOTE — Telephone Encounter (Signed)
Agree with plan. --VRP 

## 2016-05-26 NOTE — Telephone Encounter (Signed)
Per Dr Marjory Lies, LVM advising patient to continue taking prednisone as ordered. Requested he call back Monday  If he feels he still needs steroids. Also advised that he must go to the ED over the weekend for any concerns, problems as needed. Left this office's number and advised we have a dr on call over the weekend.

## 2016-05-26 NOTE — Telephone Encounter (Signed)
Patient call back about wanting IV steroids. Pt was prescribed oral prednisone by Dr.Willis on yesterday. PT knows Dr. Terrace Arabia is out of the office.  PT reported his legs are tight and weak.Rn ask pt did he start taking the oral prednisone yesterday. PT stated he has taken all of the prednisone as of today. Pts conversation about the prednisone was unclear if he was still taking it or not. Rn stated a message will be sent to the work in MD.Pt verbalized understanding.

## 2016-05-30 ENCOUNTER — Other Ambulatory Visit: Payer: Self-pay | Admitting: *Deleted

## 2016-05-30 ENCOUNTER — Telehealth: Payer: Self-pay | Admitting: Neurology

## 2016-05-30 DIAGNOSIS — F1990 Other psychoactive substance use, unspecified, uncomplicated: Secondary | ICD-10-CM

## 2016-05-30 DIAGNOSIS — Z79899 Other long term (current) drug therapy: Secondary | ICD-10-CM

## 2016-05-30 DIAGNOSIS — G35 Multiple sclerosis: Secondary | ICD-10-CM

## 2016-05-30 MED ORDER — METHYLPREDNISOLONE SODIUM SUCC 125 MG IJ SOLR: INTRAMUSCULAR | 2 refills | Status: DC

## 2016-05-30 NOTE — Telephone Encounter (Signed)
Spoke to patient - states he took all #42 tablets of Prednisone in two days, despite the tapering instructions on the prescription.  Says he felt it would help him quicker.  I explained the importance of following physician instructions.  Reporting his symptoms to be worse - numbness/tingling, worsening gait.  He is requesting IV steroids.

## 2016-05-30 NOTE — Telephone Encounter (Signed)
Patient non compliant with medication instructions, I would feel better he gets IV steroids than oral medication. 1000 mg methylprednisolone in office for 3 consecutive days. I recommend a Tox/ Drug screen.

## 2016-05-30 NOTE — Telephone Encounter (Signed)
Patient is unable to get transportation to our office today.  He will come in for three consecutive days of infusions starting tomorrow at 1pm.  He is aware that lab orders have also been placed.

## 2016-05-30 NOTE — Telephone Encounter (Signed)
Pt called today said he is numb from the neck down, said he falls if he walks, feel like he walking in mud. He is wanting IV steroids.Please call at [phone]

## 2016-05-31 ENCOUNTER — Telehealth: Payer: Self-pay | Admitting: Neurology

## 2016-05-31 DIAGNOSIS — G35 Multiple sclerosis: Secondary | ICD-10-CM | POA: Diagnosis not present

## 2016-05-31 NOTE — Telephone Encounter (Signed)
Christy with [facility] is needing clarification on methylPREDNISolone sodium succinate (SOLU-MEDROL) 125 mg/2 mL injection.  Please call

## 2016-05-31 NOTE — Telephone Encounter (Signed)
Returned call to [place] at [facility]. Patient is having in-office Solu-Medrol infusions.  Rx sent to pharmacy in error and has been voided.

## 2016-06-01 DIAGNOSIS — G35 Multiple sclerosis: Secondary | ICD-10-CM | POA: Diagnosis not present

## 2016-06-02 ENCOUNTER — Telehealth: Payer: Self-pay | Admitting: Neurology

## 2016-06-02 DIAGNOSIS — G35 Multiple sclerosis: Secondary | ICD-10-CM | POA: Diagnosis not present

## 2016-06-02 NOTE — Telephone Encounter (Signed)
Patient was here for an infusion today. Would like call back regarding any follow up needed with Dr. Terrace Arabia. Best call back [phone]

## 2016-06-05 NOTE — Telephone Encounter (Signed)
Returned call to patient - he would like to come in and see Dr. Terrace Arabia to discuss MS medication options.  He specifically asked to be seen on 06/28/16 and his request has been accommodated.

## 2016-06-28 ENCOUNTER — Ambulatory Visit: Payer: Self-pay | Admitting: Neurology

## 2016-07-12 ENCOUNTER — Telehealth: Payer: Self-pay | Admitting: Neurology

## 2016-07-12 NOTE — Telephone Encounter (Signed)
Pt is returning the call to Michelle, he is asking to be called back at [phone] he apologized for missing your call °

## 2016-07-12 NOTE — Telephone Encounter (Signed)
Pt is returning the call to [name], he is asking to be called back at [phone] he apologized for missing your call

## 2016-07-12 NOTE — Telephone Encounter (Signed)
Left message for a return call

## 2016-07-12 NOTE — Telephone Encounter (Signed)
Duplicate message. 

## 2016-07-12 NOTE — Telephone Encounter (Signed)
Spoke to [place] - he would like to discuss MS treatment options.  He has been offered an appt on 07/20/16 at 11:30am - arriving at 11:00am.  I have held the time for him.  He is going to check with his transportation and call back today to confirm if the time will work for him.

## 2016-07-12 NOTE — Telephone Encounter (Signed)
Patient says his MS is not any better since he had steroid treatment about a month ago. Please call to discuss.

## 2016-07-12 NOTE — Telephone Encounter (Signed)
He has declined the appt on 07/20/16 and will be coming in on 07/26/16.  He is inquiring about treatment with steroids again.  States he "wants his symptoms under control" and that "MS medications do not help the symptoms".

## 2016-07-12 NOTE — Telephone Encounter (Signed)
I spoke to patient again and he says he is open to listening to MS medication options.

## 2016-07-26 ENCOUNTER — Ambulatory Visit (INDEPENDENT_AMBULATORY_CARE_PROVIDER_SITE_OTHER): Payer: Medicare HMO | Admitting: Neurology

## 2016-07-26 ENCOUNTER — Encounter: Payer: Self-pay | Admitting: Neurology

## 2016-07-26 VITALS — BP 146/85 | HR 83 | Ht 70.0 in | Wt 175.5 lb

## 2016-07-26 DIAGNOSIS — Z79899 Other long term (current) drug therapy: Secondary | ICD-10-CM | POA: Diagnosis not present

## 2016-07-26 DIAGNOSIS — G35 Multiple sclerosis: Secondary | ICD-10-CM

## 2016-07-26 DIAGNOSIS — R269 Unspecified abnormalities of gait and mobility: Secondary | ICD-10-CM | POA: Diagnosis not present

## 2016-07-26 NOTE — Progress Notes (Signed)
PATIENT: [name] DOB: 1980/06/15  Chief Complaint  Patient presents with  . Multiple Sclerosis    His fatigue and gait have continued to worsen. He would like to discuss medication options for his MS.     HISTORICAL  [name] is a 36 years old right-handed male, seen in refer by his primary care physician PA Cyndi Bender for continued care of his relapsing remitting multiple sclerosis.  I saw him last in December 2013, he presented with gait abnormality numbness from neck down in August 2010, relapsing remitting multiple sclerosis was diagnosed based on abnormal MRI of the brain and cervical spine, extensive laboratory evaluation was able to rule out mimics, including normal or negative CMP CBC Lyme titer, anticardiolipin antibodies, ANA, RPR, ESR, Ace level, his LAD (with symptoms improved with IV steroid, he was initially treated with [facility], complains of flulike illness, injection site reaction.  Second flareup was left optic neuritis, he gained most recovery with IV steroid, but continue have mild blurry vision at his left eye. He was treated with Betaseron for short period of time, he could not tolerate it either.   At baseline, he has mild gait abnormality, constipation, heat intolerance, extreme fatigue, short-term memory trouble, he was treated with Dwyane Dee since May 2012, last iv Tysarbri infusion was August 2013, he is JC virus antibody was negative, anti-Tysarbri antibody was negative, he complains of episode of shivering, extreme fatigue, after each infusion, decided to go without treatment since 2013.  Instead he was smoking marijuana every day,  I personally reviewed MRI of the brain with without contrast August 2014, enhancing lesion in the left parietal region, multiple periventricular, pericallosal, subcortical, juxtacortical, midbrain, pontine, cerebellar chronic demyelinating plaques, the left parietal enhancing lesion is a new finding compared to MRI scan in  2013.  Most recent MRI cervical spine October 2013, multiple scattered short segment demyelinating plaque throughout C3 to upper thoracic T2 level.  He has lost follow-up since last visit in December 2013, today he came in with his mother and on, he has been smoking marijuana multiple dose daily, decided to go all [place], actually he went on a trip around September 2017  to [place] to try more legal marijuana, around that time, he noticed worsening gait abnormality, fatigue,  Today he came he wants to get IV infusion for his flareup, which has been ongoing for more than 5 months, he has separated from his wife and children. Now lives with his mother  UPDATE July 26 2016: He lives with his mother, he still has significant gait abnormality, worse in right leg, continue complains of significant fatigue, using marijuana regularly, also large dose of tumeric.  We have personally reviewed MRI of brain, and cervical spine January 2018. 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  He was treated with a round of prednisone on May 25 2016, without significant improvement of his symptoms.  We also reviewed his laboratory evaluations. Negative UA, HIV, RPR, normal folic acid, vitamin W25, TSH, mildly low vitamin D 29, CMP, CBC.  REVIEW OF SYSTEMS: Full 14 system review of systems performed and notable only for as above  ALLERGIES: No Known Allergies  HOME MEDICATIONS: No current outpatient prescriptions on file.   No current facility-administered medications for this visit.     PAST MEDICAL HISTORY: Past Medical History:  Diagnosis Date  . Depression   . Diabetes mellitus without complication ([place])   . Eye abnormalities    unspecified by pt  . Hypertension   . Multiple sclerosis ([place])   . Weakness     generalized    PAST SURGICAL HISTORY: No past surgical history on file.  FAMILY HISTORY: Family History  Problem Relation Age of Onset  . Adopted: Yes  . Multiple sclerosis Cousin   . Heart disease Mother   . Other Father     Does not know his father.    SOCIAL HISTORY:  Social History   Social History  . Marital status: Married    Spouse name: N/A  . Number of children: 2  . Years of education: 12   Occupational History  . Not on file.   Social History Main Topics  . Smoking status: Current Every Day Smoker    Packs/day: 1.00    Types: Cigarettes  . Smokeless tobacco: Never Used  . Alcohol use 7.2 oz/week    12 Cans of beer per week  . Drug use: Yes    Frequency: 30.0 times per week    Types: Marijuana     Comment: Daily  . Sexual activity: Yes   Other Topics Concern  . Not on file   Social History Narrative   Patient lives at home with his wife and children.    Patient is presently not working. Patient has a high school education.   Adopted.   Right-handed.   At least 8 cups caffeine daily.        PHYSICAL EXAM   Vitals:   07/26/16 0756  BP: (!) 146/85  Pulse: 83  Weight: 175 lb 8 oz (79.6 kg)  Height: 5' 10"  (1.778 m)    Not recorded      Body mass index is 25.18 kg/m.  PHYSICAL EXAMNIATION:  Gen: NAD, conversant, well nourised, obese, well groomed                     Cardiovascular: Regular rate rhythm, no peripheral edema, warm, nontender. Eyes: Conjunctivae clear without exudates or hemorrhage Neck: Supple, no carotid bruits. Pulmonary: Clear to auscultation bilaterally   NEUROLOGICAL EXAM:  MENTAL STATUS: Speech:    Speech is normal; fluent and spontaneous with normal comprehension.  Cognition:     Orientation to time, place and person     Normal recent and remote memory     Normal Attention span and concentration     Normal Language, naming, repeating,spontaneous speech     Fund of knowledge   CRANIAL NERVES: CN II:  Visual fields are full to confrontation. Fundoscopic exam is normal with sharp discs and no vascular changes. Pupils are round equal and briskly reactive to light. CN III, IV, VI: extraocular movement are normal. No ptosis. CN V: Facial sensation is intact to pinprick in all 3 divisions bilaterally. Corneal responses are intact.  CN VII: Face is symmetric with normal eye closure and smile. CN VIII: Hearing is normal to rubbing fingers CN IX, X: Palate elevates symmetrically. Phonation is normal. CN XI: Head turning and shoulder shrug are intact CN XII: Tongue is midline with normal movements and no atrophy.  MOTOR: Pronation drift of left upper extremity, fixation of left upper extremity on rapid rotating movement. Mild left shoulder abduction, extension weakness. He has moderate spasticity of bilateral lower extremity, mild to moderate bilateral hip flexion weakness, worse on the right side, mild right ankle dorsiflexion weakness.  REFLEXES: Reflexes are 3 and symmetric at the biceps, triceps, knees, and ankles. Plantar responses are flexor.  SENSORY: Intact to light touch, pinprick, positional sensation and vibratory sensation are intact in fingers and toes.  COORDINATION: Mild diysmetria on left finger to nose, bilateral lower extremity heel-to-shin  GAIT/STANCE: He needs push up to get up from seated position, wide based, cautious, unsteady gait, dragging right leg   DIAGNOSTIC DATA (LABS, IMAGING, TESTING) - I reviewed patient records, labs, notes, testing and imaging myself where available.   ASSESSMENT AND PLAN  [name] is a 36 y.o. male   Relapsing remitting multiple sclerosis with significant cervical spine involvement   Has been off long term immunomodulation therapy since 2013,  After discuss with patient, we decided to proceed with lemetrada treatment, potential benefit and side effect was explained.  Emphasized the importance of complying with his monthly  laboratory evaluation, also explained the schedule for medication administration. He voiced understanding.  Laboratory evaluation today  [name], M.D. Ph.D.  [facility] [address] [place], [place] [address] Ph: [phone] Fax: [phone]  CC: Referring Provider

## 2016-07-26 NOTE — Addendum Note (Signed)
Addended by: Levert Feinstein on: 07/26/2016 09:50 AM   Modules accepted: Orders

## 2016-07-27 LAB — CBC WITH DIFFERENTIAL
BASOS ABS: 0 10*3/uL (ref 0.0–0.2)
Basos: 1 %
EOS (ABSOLUTE): 0.1 10*3/uL (ref 0.0–0.4)
Eos: 2 %
HEMOGLOBIN: 13.6 g/dL (ref 13.0–17.7)
Hematocrit: 41.9 % (ref 37.5–51.0)
Immature Grans (Abs): 0 10*3/uL (ref 0.0–0.1)
Immature Granulocytes: 0 %
LYMPHS ABS: 2.1 10*3/uL (ref 0.7–3.1)
Lymphs: 40 %
MCH: 28.4 pg (ref 26.6–33.0)
MCHC: 32.5 g/dL (ref 31.5–35.7)
MCV: 88 fL (ref 79–97)
MONOCYTES: 7 %
Monocytes Absolute: 0.4 10*3/uL (ref 0.1–0.9)
NEUTROS ABS: 2.7 10*3/uL (ref 1.4–7.0)
Neutrophils: 50 %
RBC: 4.79 x10E6/uL (ref 4.14–5.80)
RDW: 15.1 % (ref 12.3–15.4)
WBC: 5.3 10*3/uL (ref 3.4–10.8)

## 2016-07-27 LAB — COMPREHENSIVE METABOLIC PANEL
A/G RATIO: 1.8 (ref 1.2–2.2)
ALBUMIN: 4.6 g/dL (ref 3.5–5.5)
ALT: 20 IU/L (ref 0–44)
AST: 19 IU/L (ref 0–40)
Alkaline Phosphatase: 51 IU/L (ref 39–117)
BUN/Creatinine Ratio: 13 (ref 9–20)
BUN: 10 mg/dL (ref 6–20)
Bilirubin Total: 0.5 mg/dL (ref 0.0–1.2)
CALCIUM: 9.7 mg/dL (ref 8.7–10.2)
CO2: 23 mmol/L (ref 18–29)
Chloride: 103 mmol/L (ref 96–106)
Creatinine, Ser: 0.8 mg/dL (ref 0.76–1.27)
GFR, EST AFRICAN AMERICAN: 134 mL/min/{1.73_m2} (ref >59)
GFR, EST NON AFRICAN AMERICAN: 116 mL/min/{1.73_m2} (ref >59)
GLOBULIN, TOTAL: 2.5 g/dL (ref 1.5–4.5)
Glucose: 101 mg/dL — ABNORMAL HIGH (ref 65–99)
POTASSIUM: 4.4 mmol/L (ref 3.5–5.2)
Sodium: 142 mmol/L (ref 134–144)
TOTAL PROTEIN: 7.1 g/dL (ref 6.0–8.5)

## 2016-07-27 LAB — HEPATITIS C ANTIBODY: Hep C Virus Ab: <0.1 s/co ratio (ref 0.0–0.9)

## 2016-07-27 LAB — HEPATITIS B SURFACE ANTIGEN: Hepatitis B Surface Ag: NEGATIVE

## 2016-07-27 LAB — HEPATITIS B SURFACE ANTIBODY,QUALITATIVE: Hep B Surface Ab, Qual: NONREACTIVE

## 2016-07-28 ENCOUNTER — Telehealth: Payer: Self-pay | Admitting: Neurology

## 2016-07-28 LAB — QUANTIFERON IN TUBE
QFT TB AG MINUS NIL VALUE: 0.01 IU/mL
QUANTIFERON MITOGEN VALUE: 6.24 [IU]/mL
QUANTIFERON TB AG VALUE: 0.04 IU/mL
QUANTIFERON TB GOLD: NEGATIVE
Quantiferon Nil Value: 0.03 IU/mL

## 2016-07-28 LAB — QUANTIFERON TB GOLD ASSAY (BLOOD)

## 2016-07-28 NOTE — Telephone Encounter (Signed)
Pt called said he is calling about status of lemetrada. Please call

## 2016-07-28 NOTE — Telephone Encounter (Signed)
Please update him on the status of [place]

## 2016-07-31 LAB — URINALYSIS, ROUTINE W REFLEX MICROSCOPIC
Bilirubin, UA: NEGATIVE
Glucose, UA: NEGATIVE
Ketones, UA: NEGATIVE
Nitrite, UA: NEGATIVE
PH UA: 5 (ref 5.0–7.5)
Protein, UA: NEGATIVE
RBC, UA: NEGATIVE
SPEC GRAV UA: 1.023 (ref 1.005–1.030)
Urobilinogen, Ur: 1 mg/dL (ref 0.2–1.0)

## 2016-07-31 LAB — MICROSCOPIC EXAMINATION: Casts: NONE SEEN /lpf

## 2016-07-31 LAB — DRUG SCREEN 764883 11+OXYCO+ALC+CRT-BUND
Amphetamines, Urine: NEGATIVE ng/mL
BENZODIAZ UR QL: NEGATIVE ng/mL
Barbiturate: NEGATIVE ng/mL
COCAINE (METABOLITE): NEGATIVE ng/mL
CREATININE: 206.8 mg/dL (ref 20.0–300.0)
Ethanol: NEGATIVE %
Meperidine: NEGATIVE ng/mL
Methadone Screen, Urine: NEGATIVE ng/mL
OPIATE SCREEN URINE: NEGATIVE ng/mL
Oxycodone/Oxymorphone, Urine: NEGATIVE ng/mL
PH OF URINE: 5.5 (ref 4.5–8.9)
PHENCYCLIDINE: NEGATIVE ng/mL
PROPOXYPHENE: NEGATIVE ng/mL
TRAMADOL: NEGATIVE ng/mL

## 2016-07-31 LAB — CANNABINOID CONFIRMATION, UR: CANNABINOIDS, UR: POSITIVE — AB

## 2016-07-31 NOTE — Telephone Encounter (Signed)
Pt called back, advised him RN and provider are not in the office today but a msg had been sent to RN from provider to call him

## 2016-08-01 ENCOUNTER — Telehealth: Payer: Self-pay | Admitting: *Deleted

## 2016-08-01 ENCOUNTER — Telehealth: Payer: Self-pay | Admitting: Neurology

## 2016-08-01 NOTE — Telephone Encounter (Signed)
Spoke to [place] - he is aware of labs.  His [name] is in process of being approved now.

## 2016-08-01 NOTE — Telephone Encounter (Signed)
Labs collected 07/26/16: JCV 0.12 negative

## 2016-08-01 NOTE — Telephone Encounter (Signed)
Returned call to patient - left message letting him know it takes approximately four weeks to get [place] started.  We have to get him enrolled in the [place] program, get the medication approved by insurance, get patient assistance set up and get the medication ordered.  Inetta Fermo will call him, from [facility], to schedule his appt once everything is in place.

## 2016-08-01 NOTE — Telephone Encounter (Signed)
Laboratory evaluations showed no significant abnormality, other than a UDS showed positive for marijuana,  Please try to fit him on Lemetrada schedule as soon as we can

## 2016-08-08 ENCOUNTER — Telehealth: Payer: Self-pay | Admitting: Neurology

## 2016-08-08 NOTE — Telephone Encounter (Signed)
Pt would like a call back re: when he will be able to start Lemtrada.  He said he has already heard from insurance company that it was approved.

## 2016-08-08 NOTE — Telephone Encounter (Signed)
His Lemtrada infusion will be scheduled by [facility].  I have provided this message to [facility], along with his call back number.

## 2016-08-29 NOTE — Telephone Encounter (Signed)
Patient would like a call back to discuss scheduling Lemtrada.  Provided his call back information to [facility].

## 2016-08-29 NOTE — Telephone Encounter (Addendum)
Pt called back, thinks he gace the wrong call back number. He should be called back at [phone]

## 2016-08-29 NOTE — Telephone Encounter (Signed)
Patient called office in reference to [facility] being scheduled.  Patient states she can walk, but can't walk well and feeling weak.  Patient states he is unable to use a cane due to his legs being bad.  Please call

## 2016-09-01 NOTE — Telephone Encounter (Signed)
Pt called said he talked with [facility] today and has been approved for a few weeks. He is wanting to proceed with the treatment. Pt is wanting to be scheduled asap.

## 2016-09-01 NOTE — Telephone Encounter (Signed)
[facility] has spoken to the patient multiple times over the last two weeks, keeping him updated with the [place] process.  This message will also be provided to them so they can call to schedule his infusion, once his patient assistance has been completed.

## 2016-09-19 ENCOUNTER — Telehealth: Payer: Self-pay | Admitting: *Deleted

## 2016-09-19 ENCOUNTER — Telehealth: Payer: Self-pay | Admitting: Neurology

## 2016-09-19 NOTE — Telephone Encounter (Signed)
Spoke to Marion, Charity fundraiser with [facility] today.  [facility] has reached out to this patient numerous times without success.  States they have left multiple messages and now his voicemail is full.  They are unable to move forward with his Lemtrada until he contacts them.

## 2016-10-09 DIAGNOSIS — G35 Multiple sclerosis: Secondary | ICD-10-CM | POA: Diagnosis not present

## 2016-10-10 DIAGNOSIS — G35 Multiple sclerosis: Secondary | ICD-10-CM | POA: Diagnosis not present

## 2016-10-11 DIAGNOSIS — G35 Multiple sclerosis: Secondary | ICD-10-CM | POA: Diagnosis not present

## 2016-10-12 ENCOUNTER — Telehealth: Payer: Self-pay | Admitting: Neurology

## 2016-10-12 DIAGNOSIS — G35 Multiple sclerosis: Secondary | ICD-10-CM | POA: Diagnosis not present

## 2016-10-12 MED ORDER — VALACYCLOVIR HCL 500 MG PO TABS: 500.0000 mg | ORAL_TABLET | Freq: Two times a day (BID) | ORAL | 11 refills | Status: DC

## 2016-10-12 NOTE — Telephone Encounter (Signed)
His gait has much improved with IV infusion, he overall tolerated the infusion well

## 2016-10-13 DIAGNOSIS — G35 Multiple sclerosis: Secondary | ICD-10-CM | POA: Diagnosis not present

## 2016-10-23 ENCOUNTER — Ambulatory Visit: Payer: Medicare HMO | Admitting: Neurology

## 2016-11-02 ENCOUNTER — Telehealth: Payer: Self-pay | Admitting: Neurology

## 2016-11-02 NOTE — Telephone Encounter (Signed)
Pt calling stating it has been about 3 weeks since [place], pt states it is now very hard for him to be able to walk and is experiencing a lot of pain, please call

## 2016-11-02 NOTE — Telephone Encounter (Signed)
Dr. Epimenio Foot has addressed patient's concerns - per vo by Dr. Epimenio Foot, IV Solu-Medrol 1 gram x 3 days.

## 2016-11-02 NOTE — Telephone Encounter (Addendum)
Spoke to [place] - he is agreeable to this plan.  States he will have to call [facility] back to schedule his times.  Says he may not be able to start until next week.

## 2016-11-27 NOTE — Telephone Encounter (Addendum)
Spoke to [name] - he had his first year infusion of Lemtrada 10/09/16 - 10/13/16.  He understands that his next [name] is not due until June of 2019.  Feels he is no better since having his infusion.  He was offered IV steroids on July 5th and he never scheduled the infusions. I offered him an earlier appt and he declined.  He said "what could possibly be done by me coming to an appt".  I discussed the importance of being medically monitored after having Lemtrada (follow up appts and monthly labs).  He verbalized understanding and stated he would be compliant.

## 2016-11-27 NOTE — Telephone Encounter (Signed)
Pt states he has not been contacted to schedule his [name] again, pt is asking for a call back

## 2016-11-29 ENCOUNTER — Telehealth: Payer: Self-pay | Admitting: Neurology

## 2016-11-29 NOTE — Telephone Encounter (Signed)
Returned call to [place] - he will contact his PCP to discuss his symptoms of becoming easily frustrated.

## 2016-11-29 NOTE — Telephone Encounter (Signed)
Patient is calling. He says he has been getting so frustrated and mad x 3 weeks. Please call and discuss.

## 2016-12-07 ENCOUNTER — Ambulatory Visit: Payer: Self-pay | Admitting: Neurology

## 2016-12-22 ENCOUNTER — Telehealth: Payer: Self-pay | Admitting: Neurology

## 2016-12-22 NOTE — Telephone Encounter (Signed)
Clois Dupes from [facility] called to inform that labs have not been received because [facility] has not been able to reach pt.  They have called and left messages re: pending scheduling  And labs since Aug 16th with no response/call back from pt.  The telephone #'s that they have for pt have been confirmed with the #'s listed for him with [facility] as well.  Bonita Quin states if RN [name] would like to call her back on Monday her hours will be 8:30-5:30 and a message can be left should she be in a meeting.  Please call [phone] xt [phone]

## 2016-12-25 ENCOUNTER — Encounter: Payer: Self-pay | Admitting: *Deleted

## 2016-12-25 ENCOUNTER — Ambulatory Visit: Payer: Medicare HMO | Admitting: Neurology

## 2016-12-25 NOTE — Telephone Encounter (Addendum)
I attempted to reach the patient to discuss the importance of being compliant with monthly labs.  His first year Lemtrada infusion was 10/09/16 through 10/13/16 and he has not had labs drawn since his infusion.  His phone has a message stating "the person you are trying to reach is not accepting calls at this time".  I called the listed home number in his chart and his mother answered the phone.  She is not on his HIPAA form so I was only able to leave my name and call back number.  His wife is listed on his HIPAA. I called her but there was no answer and the voicemail box was full.  He was on Dr. Zannie Cove schedule today and no showed his appointment.  I will mail a letter to his home address today.

## 2016-12-26 ENCOUNTER — Encounter: Payer: Self-pay | Admitting: Neurology

## 2017-01-03 NOTE — Telephone Encounter (Signed)
I attempted to reach patient again today.  Both his home and mobile numbers have now been disconnected.  I have called Natalia Leatherwood Stansel's number (listed as his wife on DPR) and left a message requesting an urgent call back from the patient.  He has not completed any post-infusion labs for his Lemtrada monitoring.

## 2017-02-13 ENCOUNTER — Telehealth: Payer: Self-pay | Admitting: *Deleted

## 2017-02-13 NOTE — Telephone Encounter (Addendum)
Dr. Terrace Arabia has reviewed patient's labs, signed the results and sent them to scanning.

## 2017-02-13 NOTE — Telephone Encounter (Signed)
Lemtrada labs collected 02/08/17:  Helper T-Lymph-CD4 -Absolute CD 4 Helper: 96 (L) -% CD 4 Pos. Lymph.: 24.0 (L)  WBC: 2.4 (L) Lymphs: 0.4 (L)  UA: -Specific Gravity: >1.030 (Abnormal)   All other labs WNL.  Creatinine: 0.96 (WNL)  TSH: 0.606 (WNL)

## 2017-03-02 ENCOUNTER — Encounter: Payer: Self-pay | Admitting: Neurology

## 2017-03-06 ENCOUNTER — Telehealth: Payer: Self-pay | Admitting: *Deleted

## 2017-03-06 NOTE — Telephone Encounter (Signed)
Lemtrada labs collected 03/02/17:  Helper T-Lymph-CD4 -Absolute CD 4 Helper: 120 (L) -% CD 4 Pos. Lymph.: 24.0 (L)  WBC: 2.9 (L) Lymphs: 0.5 (L)  UA: WNL   All other labs WNL.  Creatinine: 0.89 (WNL)  TSH: 0.748 (WNL)  Labs provided to Dr. Terrace Arabia for review and sent for scanning.  Left message at his listed home number requesting him to call and schedule an appt with Dr. Terrace Arabia. Listed mobile number not working at this time.

## 2017-03-08 NOTE — Telephone Encounter (Signed)
Attempt to get in next Thursday at 1600, ok per Dr. Terrace Arabia.

## 2017-03-08 NOTE — Telephone Encounter (Signed)
Pt has returned the call to [employer].  I reached out to [facility] for clarity on what type appointment needed to be scheduled for him with Dr Terrace Arabia.  Please call pt back on his new mobile #[phone]

## 2017-03-09 NOTE — Telephone Encounter (Signed)
I spoke to pt and relayed to come in next Thursday 03-15-17 for appt at 1600.  He could not confirm.  He will call back to let us know.

## 2017-03-19 ENCOUNTER — Telehealth: Payer: Self-pay | Admitting: Neurology

## 2017-03-19 NOTE — Telephone Encounter (Signed)
Pt called said lemtrada taking him down. He said RN would know what he meant. Please call

## 2017-03-19 NOTE — Telephone Encounter (Addendum)
Spoke to patient - says he is frustrated with his MS and feels nothing has changed since receiving [place] in June 2018.  I offered him an appt, since he has not been seen since March 2018.  He made the following statements:  1) "our practice is made up of suckers who pay co-pays for an exam he could teach his six-year old to do" 2) "could watch youtube videos to do what Dr. Terrace ArabiaYan does" 3) "I'm no dummy - the doctor just wants his copay for doing nothing" 4) "I need a better provider/patient relationship." 5) "our office is taking his money and not giving him any help" 6) He said "shut the f... up" while I was speaking to him and I ask him not to use that language.  He said he was speaking to someone else. 7) "I'm the one who had to tell Dr. Terrace ArabiaYan about Julaine HuaLemtrada and suggest it."  The call disconnected, on his end, while we were speaking.  I called him back and again offered him multiple appts, stating the importance of seeing Dr. Terrace ArabiaYan, especially with a high risk medication like Lemtrada.  Also, if she is unable to monitor him, she will not be able to responsibly authorize his second year infusion.  In the end, he agreed to come in for an appt on 04/12/17.

## 2017-04-02 ENCOUNTER — Encounter: Payer: Self-pay | Admitting: Neurology

## 2017-04-03 ENCOUNTER — Telehealth: Payer: Self-pay | Admitting: *Deleted

## 2017-04-03 NOTE — Telephone Encounter (Signed)
Lemtrada labs collected 04/02/17:  Helper T-Lymph-CD4 -Absolute CD 4 Helper: 154 (L) -% CD 4 Pos. Lymph.: 25.6.0 (L)  WBC: 3.2 (L) Hemoglobin: 12.7 (L) Hematocrit: 37/4 (L) Lymphs (Absolute): 0.6 (L)  UA:  Specific gravity: >=1.030 - Abnormal WBC Esterase: Trace - Abnormal Ketones: Trace - Abnormal WBC: 6-10 - Abnormal Casts: Present - Abnormal  Creatinine: 0.79 (WNL)  TSH: 0.720 (WNL)  Labs provided to Dr. Terrace Arabia for review and sent for scanning.

## 2017-04-10 ENCOUNTER — Other Ambulatory Visit: Payer: Self-pay

## 2017-04-10 ENCOUNTER — Encounter (HOSPITAL_COMMUNITY): Payer: Self-pay | Admitting: Emergency Medicine

## 2017-04-10 DIAGNOSIS — E538 Deficiency of other specified B group vitamins: Secondary | ICD-10-CM | POA: Diagnosis not present

## 2017-04-10 DIAGNOSIS — R208 Other disturbances of skin sensation: Secondary | ICD-10-CM | POA: Diagnosis not present

## 2017-04-10 DIAGNOSIS — E559 Vitamin D deficiency, unspecified: Secondary | ICD-10-CM | POA: Diagnosis present

## 2017-04-10 DIAGNOSIS — Z820 Family history of epilepsy and other diseases of the nervous system: Secondary | ICD-10-CM

## 2017-04-10 DIAGNOSIS — R350 Frequency of micturition: Secondary | ICD-10-CM | POA: Diagnosis not present

## 2017-04-10 DIAGNOSIS — R7303 Prediabetes: Secondary | ICD-10-CM | POA: Diagnosis not present

## 2017-04-10 DIAGNOSIS — F17210 Nicotine dependence, cigarettes, uncomplicated: Secondary | ICD-10-CM | POA: Diagnosis not present

## 2017-04-10 DIAGNOSIS — D72829 Elevated white blood cell count, unspecified: Secondary | ICD-10-CM | POA: Diagnosis present

## 2017-04-10 DIAGNOSIS — F329 Major depressive disorder, single episode, unspecified: Secondary | ICD-10-CM | POA: Diagnosis present

## 2017-04-10 DIAGNOSIS — T380X5A Adverse effect of glucocorticoids and synthetic analogues, initial encounter: Secondary | ICD-10-CM | POA: Diagnosis not present

## 2017-04-10 DIAGNOSIS — R0602 Shortness of breath: Secondary | ICD-10-CM | POA: Diagnosis not present

## 2017-04-10 DIAGNOSIS — Z720 Tobacco use: Secondary | ICD-10-CM | POA: Diagnosis not present

## 2017-04-10 DIAGNOSIS — R29810 Facial weakness: Secondary | ICD-10-CM | POA: Diagnosis present

## 2017-04-10 DIAGNOSIS — R531 Weakness: Secondary | ICD-10-CM | POA: Diagnosis not present

## 2017-04-10 DIAGNOSIS — G35 Multiple sclerosis: Secondary | ICD-10-CM | POA: Diagnosis not present

## 2017-04-10 DIAGNOSIS — M792 Neuralgia and neuritis, unspecified: Secondary | ICD-10-CM | POA: Diagnosis not present

## 2017-04-10 DIAGNOSIS — I10 Essential (primary) hypertension: Secondary | ICD-10-CM | POA: Diagnosis not present

## 2017-04-10 DIAGNOSIS — E1165 Type 2 diabetes mellitus with hyperglycemia: Secondary | ICD-10-CM | POA: Diagnosis not present

## 2017-04-10 DIAGNOSIS — R739 Hyperglycemia, unspecified: Secondary | ICD-10-CM | POA: Diagnosis not present

## 2017-04-10 DIAGNOSIS — S0990XA Unspecified injury of head, initial encounter: Secondary | ICD-10-CM | POA: Diagnosis not present

## 2017-04-10 DIAGNOSIS — K5900 Constipation, unspecified: Secondary | ICD-10-CM | POA: Diagnosis not present

## 2017-04-10 DIAGNOSIS — D72828 Other elevated white blood cell count: Secondary | ICD-10-CM | POA: Diagnosis not present

## 2017-04-10 DIAGNOSIS — G8191 Hemiplegia, unspecified affecting right dominant side: Secondary | ICD-10-CM | POA: Diagnosis present

## 2017-04-10 DIAGNOSIS — F1210 Cannabis abuse, uncomplicated: Secondary | ICD-10-CM | POA: Diagnosis not present

## 2017-04-10 DIAGNOSIS — H547 Unspecified visual loss: Secondary | ICD-10-CM | POA: Diagnosis present

## 2017-04-10 DIAGNOSIS — R42 Dizziness and giddiness: Secondary | ICD-10-CM | POA: Diagnosis not present

## 2017-04-10 DIAGNOSIS — M62838 Other muscle spasm: Secondary | ICD-10-CM | POA: Diagnosis not present

## 2017-04-10 DIAGNOSIS — H538 Other visual disturbances: Secondary | ICD-10-CM | POA: Diagnosis not present

## 2017-04-10 DIAGNOSIS — E8809 Other disorders of plasma-protein metabolism, not elsewhere classified: Secondary | ICD-10-CM | POA: Diagnosis not present

## 2017-04-10 DIAGNOSIS — S199XXA Unspecified injury of neck, initial encounter: Secondary | ICD-10-CM | POA: Diagnosis not present

## 2017-04-10 NOTE — ED Triage Notes (Signed)
Pt reports he has MS, and has been having difficulty walking for he past year.  He states it has been increasingly difficulty for the past 3-5 days having fallen many times.  Pt states pain is getting worse, states he has been sob.  States he has an appointment w/ "[facility] Dr. On the 13th."  Multiple complaints concerning MS.

## 2017-04-11 ENCOUNTER — Inpatient Hospital Stay (HOSPITAL_COMMUNITY)
Admission: EM | Admit: 2017-04-11 | Discharge: 2017-04-17 | DRG: 060 | Disposition: A | Discharge status: Rehabilitation Facility | Payer: Medicare HMO | Attending: Internal Medicine | Admitting: Internal Medicine

## 2017-04-11 ENCOUNTER — Other Ambulatory Visit: Payer: Self-pay

## 2017-04-11 ENCOUNTER — Encounter (HOSPITAL_COMMUNITY): Payer: Self-pay | Admitting: General Practice

## 2017-04-11 ENCOUNTER — Emergency Department (HOSPITAL_COMMUNITY): Payer: Medicare HMO

## 2017-04-11 DIAGNOSIS — G35 Multiple sclerosis: Secondary | ICD-10-CM

## 2017-04-11 DIAGNOSIS — Z720 Tobacco use: Secondary | ICD-10-CM | POA: Diagnosis present

## 2017-04-11 DIAGNOSIS — D72829 Elevated white blood cell count, unspecified: Secondary | ICD-10-CM

## 2017-04-11 DIAGNOSIS — E559 Vitamin D deficiency, unspecified: Secondary | ICD-10-CM | POA: Diagnosis present

## 2017-04-11 DIAGNOSIS — R739 Hyperglycemia, unspecified: Secondary | ICD-10-CM | POA: Diagnosis present

## 2017-04-11 DIAGNOSIS — T380X5A Adverse effect of glucocorticoids and synthetic analogues, initial encounter: Secondary | ICD-10-CM

## 2017-04-11 DIAGNOSIS — I10 Essential (primary) hypertension: Secondary | ICD-10-CM

## 2017-04-11 DIAGNOSIS — F1210 Cannabis abuse, uncomplicated: Secondary | ICD-10-CM

## 2017-04-11 LAB — URINALYSIS, ROUTINE W REFLEX MICROSCOPIC
BACTERIA UA: NONE SEEN
Glucose, UA: NEGATIVE mg/dL
HGB URINE DIPSTICK: NEGATIVE
KETONES UR: 5 mg/dL — AB
LEUKOCYTES UA: NEGATIVE
NITRITE: NEGATIVE
Protein, ur: 30 mg/dL — AB
Specific Gravity, Urine: 1.035 — ABNORMAL HIGH (ref 1.005–1.030)
pH: 5 (ref 5.0–8.0)

## 2017-04-11 LAB — BASIC METABOLIC PANEL
ANION GAP: 5 (ref 5–15)
BUN: 11 mg/dL (ref 6–20)
CHLORIDE: 108 mmol/L (ref 101–111)
CO2: 26 mmol/L (ref 22–32)
Calcium: 8.8 mg/dL — ABNORMAL LOW (ref 8.9–10.3)
Creatinine, Ser: 0.87 mg/dL (ref 0.61–1.24)
GFR calc non Af Amer: >60 mL/min (ref >60)
GLUCOSE: 95 mg/dL (ref 65–99)
POTASSIUM: 4.1 mmol/L (ref 3.5–5.1)
Sodium: 139 mmol/L (ref 135–145)

## 2017-04-11 LAB — CBC WITH DIFFERENTIAL/PLATELET
BASOS ABS: 0 10*3/uL (ref 0.0–0.1)
Basophils Relative: 1 %
Eosinophils Absolute: 0.4 10*3/uL (ref 0.0–0.7)
Eosinophils Relative: 11 %
HEMATOCRIT: 39.4 % (ref 39.0–52.0)
HEMOGLOBIN: 13.1 g/dL (ref 13.0–17.0)
LYMPHS PCT: 17 %
Lymphs Abs: 0.6 10*3/uL — ABNORMAL LOW (ref 0.7–4.0)
MCH: 28.9 pg (ref 26.0–34.0)
MCHC: 33.2 g/dL (ref 30.0–36.0)
MCV: 86.8 fL (ref 78.0–100.0)
MONO ABS: 0.4 10*3/uL (ref 0.1–1.0)
Monocytes Relative: 11 %
NEUTROS ABS: 2.1 10*3/uL (ref 1.7–7.7)
NEUTROS PCT: 60 %
Platelets: 229 10*3/uL (ref 150–400)
RBC: 4.54 MIL/uL (ref 4.22–5.81)
RDW: 13.4 % (ref 11.5–15.5)
WBC: 3.5 10*3/uL — AB (ref 4.0–10.5)

## 2017-04-11 LAB — TSH: TSH: 0.862 u[IU]/mL (ref 0.350–4.500)

## 2017-04-11 LAB — RAPID URINE DRUG SCREEN, HOSP PERFORMED
Amphetamines: NOT DETECTED
BARBITURATES: NOT DETECTED
Benzodiazepines: NOT DETECTED
COCAINE: NOT DETECTED
OPIATES: NOT DETECTED
Tetrahydrocannabinol: POSITIVE — AB

## 2017-04-11 LAB — VITAMIN B12: Vitamin B-12: 366 pg/mL (ref 180–914)

## 2017-04-11 MED ORDER — LORAZEPAM 0.5 MG PO TABS
0.5000 mg | ORAL_TABLET | Freq: Once | ORAL | Status: AC
  Administered 2017-04-11: 0.5 mg via ORAL
  Filled 2017-04-11: qty 1

## 2017-04-11 MED ORDER — SODIUM CHLORIDE 0.9 % IV SOLN
1000.0000 mg | Freq: Once | INTRAVENOUS | Status: AC
  Administered 2017-04-11: 1000 mg via INTRAVENOUS
  Filled 2017-04-11: qty 8

## 2017-04-11 MED ORDER — ENOXAPARIN SODIUM 40 MG/0.4ML ~~LOC~~ SOLN
40.0000 mg | SUBCUTANEOUS | Status: DC
  Administered 2017-04-11 – 2017-04-17 (×7): 40 mg via SUBCUTANEOUS
  Filled 2017-04-11 (×7): qty 0.4

## 2017-04-11 MED ORDER — GADOBENATE DIMEGLUMINE 529 MG/ML IV SOLN
15.0000 mL | Freq: Once | INTRAVENOUS | Status: AC | PRN
  Administered 2017-04-11: 15 mL via INTRAVENOUS

## 2017-04-11 NOTE — Progress Notes (Signed)
Neurology Consultation Reason for Consult: Weakness, Falls Referring Physician: Dr. Preston Fleeting  CC: Pain, difficulty walking  History is obtained from: Patient  HPI: [name] is a 36 y.o. male with a past medical history significant for multiple sclerosis, hypertension, diabetes, tobacco and marijuana use presetting with complaints of pain in his legs and difficulty walking. He reports that for the past year he has had difficulties with ambulation that has gotten acutely worse over the past 2 weeks. He reports weakness and pain in his legs that affects his walking. He states he cannot walk any distance without support (hugging the wall) or he falls. He reports numerous falls over the past several days without injury. Reports weakness all over but is worst in his legs. Reports being treated in [place] a year ago with prednisone which improved his symptoms. Upon returning to [facility] he was started on Lemtrada by his neurologist. He reports his symptoms have continued to progress despite therapy. Had 5 day initiation course in March 2018. Notes he has been on interferons in the past without benefit. Reports that the only thing that works for him is prednisone. Reports vision problems that come and go but none currently. No difficulties with speech or swallowing. Denies any changes in sensation.   ROS: A 14 point ROS was performed and is negative except as noted in the HPI.   Past Medical History:  Diagnosis Date  . Depression   . Diabetes mellitus without complication (HCC)   . Eye abnormalities    unspecified by pt  . Hypertension   . Multiple sclerosis (HCC)   . Weakness    generalized    Family History  Adopted: Yes  Problem Relation Age of Onset  . Multiple sclerosis Cousin   . Heart disease Mother   . Other Father        Does not know his father.    Social History:  reports that he has been smoking cigarettes.  He has been smoking about 1.00 pack per day. he has never used  smokeless tobacco. He reports that he drinks about 7.2 oz of alcohol per week. He reports that he uses drugs. Drug: Marijuana. Frequency: 30.00 times per week.  Exam: Current vital signs: BP (!) 146/63   Pulse (!) 56   Resp 15   Ht 5\' 10"  (1.778 m)   Wt 173 lb (78.5 kg)   SpO2 100%   BMI 24.82 kg/m  Vital signs in last 24 hours: Pulse Rate:  [56-94] 56 (12/12 0400) Resp:  [12-19] 15 (12/12 0702) BP: (120-146)/(57-96) 146/63 (12/12 0702) SpO2:  [98 %-100 %] 100 % (12/12 0703) Weight:  [173 lb (78.5 kg)] 173 lb (78.5 kg) (12/11 2123)   Physical Exam  Constitutional: Appears well-developed and well-nourished.  Psych: Affect appropriate to situation Eyes: No scleral injection HENT: No OP obstrucion Head: Normocephalic.  Cardiovascular: Normal rate and regular rhythm.  Respiratory: Effort normal, non-labored breathing GI: Soft.  No distension. There is no tenderness.  Skin: WDI  Neuro: Mental Status: Patient is awake, alert, oriented to person, place, month, year, and situation. Patient is able to give a clear and coherent history. No signs of aphasia or neglect Cranial Nerves: II: Visual Fields are full. Pupils are equal, round, and reactive to light.  III,IV, VI: EOMI without ptosis or diploplia.  V: Facial sensation is asymmetric to temperature, L>R sensitivity to cold VII: Facial movement is symmetric.  VIII: hearing is intact to voice X: Uvula elevates symmetrically XI: Shoulder  shrug is symmetric. XII: tongue is midline without atrophy or fasciculations.  Motor: Tone is normal. Bulk is normal.  4/5 strength in RUE, 5/5 in LUE 4-/5 strength in RLE, 4+/5 in LLE Sensory: Sensation is symmetric to light touch in the arms and legs and temperature sensation greater on L than R Deep Tendon Reflexes: 2+ and symmetric in the biceps  3+ in right patellae. 2+ in left patellae Plantars: Toes are downgoing bilaterally.  Cerebellar: FNF dysmetria on right HKS ataxia on  right   I have reviewed labs in epic and the results pertinent to this consultation are: UDS +marijuana BMET, CBC, UA all unremarkable  I have reviewed the images obtained: MRI brain and C-spine with significant progression of demyelination from previous imaging in January. No active enhancing lesions present.   Impression:  36 year old male with history of MS on current therapy with Lemtrada presenting with acutely worsening leg weakness and gait instability for the past 2 weeks. No active enhancing lesions found on MRI brain or C-spine, however, does have significant progression of demyelinating disease from prior. Examination with focal findings mainly in the right lower extremity consistent with MS flare.   Recommendations: 1) IV solumedrol 1g qdaily x 5 days 2) PT/OT 3) MRI T-spine with contrast to assess for enhancing lesions   Valentino NoseNathan Hartleigh Edmonston, MD Internal Medicine Teaching Program PGY-3

## 2017-04-11 NOTE — H&P (Signed)
Date: 04/11/2017               Patient Name:  [name] MRN: [id number]  DOB: 1981-02-28 Age / Sex: 36 y.o., male   PCP: Lonie Peakonroy, Nathan, PA-C         Medical Service: Internal Medicine Teaching Service         Attending Physician: Dr. Gust RungHoffman, Erik C, DO    First Contact: Dr. Frances FurbishWinfrey Pager: [phone]  Second Contact: Dr. Nonda LouSvlina Pager: [phone]       After Hours (After 5p/  First Contact Pager: [phone]  weekends / holidays): Second Contact Pager: [phone]   Chief Complaint: weakness  History of Present Illness:   This is a 36 year old patient with Multiple sclerosis , depression, HTN,  ?diabetes, who presents with weakness.  He says that he has had weakness for few weeks but it has been worse in the last 5 days to the point that if he tries to walk 30 feet, then he would stumble. He also has associated pain in his calves, and feet, and above the knee. He also endorses blurry vision off and on. He denies any sensory changes, any bladder or bowel incontinence, and headaches, dizziness, n/v or diarrhea. Denies any swallowing or speech problems.   He says that he was diagnosed with MS in about 2010 and at that time was on IV steroids, and then was on several MS infusions, per chart review.  He has tried Doctor, general practiceBetaseron, Public house managerTyasbri (natalizumab), and Rebif (interferon b) and tecfidera (DMF) . Most recently he was on Lemtrada (alemtuzumab) for relapsing MS, in March 2018, and this is a yearly infusion- 1st infusion is for 5 days in March 2018, which he confirmed, and the next dose would be 12 months later in march 2019. He had an appt with Neurology tomorrow but he is here. He says the only thing that helps really is prednisone.  He says that the marijuana helps with his symptoms of multiple sclerosis.  Says that changes in weather like summer heat or cold weather makes his MS worse.   FH: 1st cousin has multiple sclerosis. No MS in parents  SH: smokes 0.5 ppd , and smokes marijuana. No cocaine  use. Chart review indicates 12 cans of beer per week, but pt denies any alcohol use to me.    Meds:  No outpatient medications have been marked as taking for the 04/11/17 encounter [facility](Hospital Encounter).     Allergies: Allergies as of 04/10/2017  . (No Known Allergies)   Past Medical History:  Diagnosis Date  . Depression   . Diabetes mellitus without complication (HCC)   . Eye abnormalities    unspecified by pt  . Hypertension   . Multiple sclerosis (HCC)   . Weakness    generalized      Review of Systems: A complete ROS was negative except as per HPI.   Physical Exam: Blood pressure (!) 146/63, pulse (!) 56, resp. rate 15, height 5\' 10"  (1.778 m), weight 173 lb (78.5 kg), SpO2 100 %.  General:  A&O, in NAD.  HEENT: PERRLA, NCAT  Eyes: no nystagmus or fatigability on sustained gaze, EOMI  Neck: supple, no lymphadenopathy CV: Regular rate, regular rhythm, no murmurs or rubs appreciated. Resp: equal and symmetric breath sounds, no wheezing or crackles. Abdomen: soft, nontender, nondistended, +BS in all 4 quadrants Skin: warm, dry, intact, no open lesions or atypical rash noted Extremities: pulses intact b/l, no edema  Neurologic exam:  MS: A&O x 3 CN II-XII grossly intact DTRs: 2+ and symmetric. No hyperreflexia  Sensory: intact to light touch and same on both sides Motor: Left upper 5/5, Left lower 5/5 Right upper 4/5, right lower 4/5  normal muscle tone Cerebellar: normal FNF, normal heel to shin for left heel- pt couldn't really do right heel to left shin  Psyc: pleasant personality, affect appropriate to mood, no focal deficits   No nystagmus observed, No fatigability on sustained gaze upwards    EKG: personally reviewed my interpretation is- not obtained  CXR: personally reviewed my interpretation is not obtained   MRI of brain and c spine obtained in ER   Assessment & Plan by Problem: Active Problems:   Multiple sclerosis (HCC)   Multiple  sclerosis flareup leading to weakness.:  Most likely etiology for his worsening weakness and motor strength is his MS exacerbation. MRI of the brain and cervical spine shows significant progression of the white matter disease in the colossoseptal margin and increase in demyelination since 2014. There is also progressive white matter disease in the spinal cord at the C2 level. However there are no enhancing lesions. In the labs, electrolytes are unremarkable. Hemoglobin is normal. UDs positive for marijuana.  I have ordered vitamin B12, and TSH, and CK to rule out other causes of weakness, give that he was having muscle pain.   -Neurology following and appreciate recs -IV solumedrol 1 g daily for 3-5 days  -They will order T spine MRI w and wo contrast to look for any enhancing lesions -follow up B12, Vitamin B12, Vit D, and CK -PT and OT for possible rehabilitation following this hospitalization  -will need his follow up appointment rescheduled with his neurologist  -will need smoking cessation   DM- Patient says that he 'treated his diabetes by using sea salt'. -will monitor morning CBGs   Smoking smokes 0.5 ppd  -will need counseling on smoking cessation as smoking can make MS worse   Depression- says depression well controlled. Not currently on any SSRIs   F E N HH  DVT ppx: lovenox  Code- full   Dispo: Admit patient to Observation with expected length of stay less than 2 midnights.  Signed: Deneise Lever, MD 04/11/2017, 9:19 AM

## 2017-04-11 NOTE — ED Provider Notes (Signed)
7:13 AM Pt signed out to me at shift change. Pt with hx of MS, followed by Dr. Terrace Arabia with neurology, here with worsening symptoms, including difficulty walking with several falls. Pt was ordered MR brain and cervical spine.   MR resulted in progressive disease.   7:41 AM Discussed with Dr. Amada Jupiter. Pt explained to me that he cannot walk even 45ft without falling down. States this is new. Will admit due to progressive symptoms. Solumedrol ordered.   8:25 AM Spoke with family practice. Will admit. Dr. Amada Jupiter to consult   Jaynie Crumble, PA-C 04/11/17 1444    Lavera Guise, MD 04/11/17 [phone]

## 2017-04-11 NOTE — ED Notes (Signed)
Pt returned to waiting room

## 2017-04-11 NOTE — ED Notes (Signed)
Patient transported to MRI 

## 2017-04-11 NOTE — ED Notes (Signed)
Pt reports that he went out to get cigars. Continuing to wait for treatment room.

## 2017-04-11 NOTE — ED Notes (Signed)
This RN visualized patient getting into car and leaving department.

## 2017-04-11 NOTE — Care Management Note (Signed)
Case Management Note  Patient Details  Name: [name] MRN: [id number] Date of Birth: 1981-02-18  Subjective/Objective:                 Spoke to patient at the bedside, he states he is from home, lives with family as he is separating from his wife. Patient drives, denies barriers getting medications or getting to MD. States he has increased weakness from his MS. Discussed DME. He refuses to consider DME as he is "not old" and it wouldn't help him walk any faster. Discussed balance and he said if he goes slow enough his balance is fine. Patient working with PT during stay, CM will continue to follow.    Action/Plan:   Expected Discharge Date:                  Expected Discharge Plan:     In-House Referral:     Discharge planning Services  CM Consult  Post Acute Care Choice:    Choice offered to:     DME Arranged:    DME Agency:     HH Arranged:    HH Agency:     Status of Service:  In process, will continue to follow  If discussed at Long Length of Stay Meetings, dates discussed:    Additional Comments:  Henry SabalDebbie Alysabeth Scalia, RN 04/11/2017, 3:04 PM

## 2017-04-11 NOTE — ED Notes (Signed)
Pt up to front desk requesting coffee. Informed patient that he cannot have any until seen by MD.

## 2017-04-11 NOTE — ED Provider Notes (Signed)
[facility] EMERGENCY DEPARTMENT Provider Note   CSN: [id number] Arrival date & time: 04/10/17  2109     History   Chief Complaint Chief Complaint  Patient presents with  . difficulty walking from MS    HPI [name] is a 36 y.o. male.  The history is provided by the patient and medical records.    36 year old male with history of depression, diabetes, hypertension, multiple sclerosis, generalized weakness, presenting to the ED with weakness.  Patient reports he has had progressive generalized weakness for the past few months, but notably worse over the past 5 days.  States he feels weak all over, but worse in the legs.  Reports he is only able to walk for about 15-20 feet before he feels like his legs give out.  He reports he has fallen multiple times over the past few days but has not sustained any significant injuries.  He has not had any head injury or loss of consciousness with the falls.  He denies any fever or chills.  No bowel or bladder incontinence.  No numbness of the legs.  States he had an MRI earlier in the year, unsure of results.  States he is followed by neurology, Dr. Terrace Arabia, but reports he is very unhappy with   Past Medical History:  Diagnosis Date  . Depression   . Diabetes mellitus without complication (HCC)   . Eye abnormalities    unspecified by pt  . Hypertension   . Multiple sclerosis (HCC)   . Weakness    generalized    Patient Active Problem List   Diagnosis Date Noted  . Abnormality of gait 05/10/2016  . Weakness   . Hypertension   . Depression   . Multiple sclerosis (HCC) 03/02/2011    History reviewed. No pertinent surgical history.     Home Medications    Prior to Admission medications   Not on File    Family History Family History  Adopted: Yes  Problem Relation Age of Onset  . Multiple sclerosis Cousin   . Heart disease Mother   . Other Father        Does not know his father.    Social  History Social History   Tobacco Use  . Smoking status: Current Every Day Smoker    Packs/day: 1.00    Types: Cigarettes  . Smokeless tobacco: Never Used  Substance Use Topics  . Alcohol use: Yes    Alcohol/week: 7.2 oz    Types: 12 Cans of beer per week  . Drug use: Yes    Frequency: 30.0 times per week    Types: Marijuana    Comment: Daily     Allergies   Patient has no known allergies.   Review of Systems Review of Systems  Neurological: Positive for weakness.  All other systems reviewed and are negative.    Physical Exam Updated Vital Signs BP (!) 120/57   Pulse (!) 58   Resp 16   Ht 5\' 10"  (1.778 m)   Wt 78.5 kg (173 lb)   SpO2 100%   BMI 24.82 kg/m   Physical Exam  Constitutional: He is oriented to person, place, and time. He appears well-developed and well-nourished.  Smells of marijuana  HENT:  Head: Normocephalic and atraumatic.  Mouth/Throat: Oropharynx is clear and moist.  Dentition appears poor  Eyes: Conjunctivae and EOM are normal. Pupils are equal, round, and reactive to light.  Neck: Normal range of motion.  Cardiovascular: Normal  rate, regular rhythm and normal heart sounds.  Pulmonary/Chest: Effort normal and breath sounds normal.  Abdominal: Soft. Bowel sounds are normal.  Musculoskeletal: Normal range of motion.  Neurological: He is alert and oriented to person, place, and time.  AAOx3, answering questions and following commands appropriately; equal strength UE and LE bilaterally; CN grossly intact; moves all extremities appropriately without ataxia; no focal neuro deficits or facial asymmetry appreciated  Skin: Skin is warm and dry.  Psychiatric: He has a normal mood and affect.  Nursing note and vitals reviewed.    ED Treatments / Results  Labs (all labs ordered are listed, but only abnormal results are displayed) Labs Reviewed  CBC WITH DIFFERENTIAL/PLATELET - Abnormal; Notable for the following components:      Result Value    WBC 3.5 (*)    Lymphs Abs 0.6 (*)    All other components within normal limits  BASIC METABOLIC PANEL - Abnormal; Notable for the following components:   Calcium 8.8 (*)    All other components within normal limits  URINALYSIS, ROUTINE W REFLEX MICROSCOPIC - Abnormal; Notable for the following components:   Color, Urine AMBER (*)    Specific Gravity, Urine 1.035 (*)    Bilirubin Urine SMALL (*)    Ketones, ur 5 (*)    Protein, ur 30 (*)    Squamous Epithelial / LPF 0-5 (*)    All other components within normal limits  RAPID URINE DRUG SCREEN, HOSP PERFORMED - Abnormal; Notable for the following components:   Tetrahydrocannabinol POSITIVE (*)    All other components within normal limits    EKG  EKG Interpretation None       Radiology No results found.  Procedures Procedures (including critical care time)  Medications Ordered in ED Medications - No data to display   Initial Impression / Assessment and Plan / ED Course  I have reviewed the triage vital signs and the nursing notes.  Pertinent labs & imaging results that were available during my care of the patient were reviewed by me and considered in my medical decision making (see chart for details).  36 y.o. M with hx of MS here with progressive weakness.  States he has felt generally weak for several months but worse over the past 5 days.  Reports legs are giving out on him causing several falls.  Denies any injuries from his falls.  On exam he is AAOx3.  No focal weakness of arms of legs noted. No significant ataxia.  Last MRI in Jan 2018-- fairly significant disease in brain/neck.  Will discuss with neurology for recommendations.    3:42 AM Spoke with neurology, Dr. Wilford CornerArora-- recommends MRI head/CS, screening labs.  If acute findings, can treat with steroids but does not necessarily require admission.  If no new changes, no acute intervention required and can follow-up as an outpatient.  Patient updated with care  plan.  Screening labs reassuring.  UDS + for marijuana.  MRI pending at this time.  6:48 AM Neurology has reviewed MRI-- no significant changes noted on their brief read through but will wait for official report.  If no acute changes, plan to d/c home with OP follow-up.  [facility] pending official read of MRI from radiology.  Aware of care plan and will disposition pending results.  Final Clinical Impressions(s) / ED Diagnoses   Final diagnoses:  History of multiple sclerosis    ED Discharge Orders    None  Garlon Hatchet, PA-C 04/11/17 [id number]    Dione Booze, MD 04/11/17 [phone]

## 2017-04-11 NOTE — Evaluation (Signed)
Physical Therapy Evaluation Patient Details Name: [name] MRN: [id number] DOB: 10/18/1980 Today's Date: 04/11/2017   History of Present Illness  Pt is a 36 y/o male admitted secondary to increased weakness. Pt has MS at baseline and imaging revealed progression of white matter disease and progress at spinal cord level especially at C2. PMH includes depression, HTN, DM, and HTN.   Clinical Impression  Pt admitted secondary to problem above with deficits below. PTA, pt was independent, however, reports he fell multiple times throughout the day. Upon eval, pt presenting with functional weakness during gait, decreased balance, and generalized pain from chest to toes. Required min guard to min A for mobility with RW. Educated about use of RW at home to increase steadiness, however, pt reports he will not use at home, therefore will likely refuse DME for home. Recommending outpatient neuro PT at d/c to address functional mobility deficits. Will have support from family at d/c. Will continue to follow acutely to maximize functional mobility independence and safety.     Follow Up Recommendations Outpatient PT;Supervision/Assistance - 24 hour(Neuro outpatient PT )    Equipment Recommendations  Rolling walker with 5" wheels;3in1 (PT)(Pt will likely refuse )    Recommendations for Other Services       Precautions / Restrictions Precautions Precautions: Fall Precaution Comments: Pt reports he falls 6 or 7 times a day secondary to foot drag.  Restrictions Weight Bearing Restrictions: No      Mobility  Bed Mobility Overal bed mobility: Modified Independent             General bed mobility comments: Increased time, however, no assist required.   Transfers Overall transfer level: Needs assistance Equipment used: Rolling walker (2 wheeled) Transfers: Sit to/from Stand Sit to Stand: Min guard         General transfer comment: Min guard for safety. Verbal cues for safe hand  placement during transfer.   Ambulation/Gait Ambulation/Gait assistance: Min guard;Min assist Ambulation Distance (Feet): 20 Feet Assistive device: Rolling walker (2 wheeled) Gait Pattern/deviations: Step-through pattern;Decreased stride length;Decreased dorsiflexion - right;Decreased dorsiflexion - left;Shuffle Gait velocity: Decreased  Gait velocity interpretation: Below normal speed for age/gender General Gait Details: Slow, unsteady gait. Pt with bilat toe drag during gait, however, cane perform ankle pumps therefore may be due to tone in LEs. Educated about using RW for gait to increase safety, however, pt reports he will likely not use at home despite safety education.   Stairs            Wheelchair Mobility    Modified Rankin (Stroke Patients Only)       Balance Overall balance assessment: Needs assistance Sitting-balance support: No upper extremity supported;Feet supported Sitting balance-Leahy Scale: Good     Standing balance support: Bilateral upper extremity supported;During functional activity Standing balance-Leahy Scale: Poor Standing balance comment: Reliant on BUE support                              Pertinent Vitals/Pain Pain Assessment: 0-10 Pain Score: 7  Pain Location: From chest down to toes  Pain Descriptors / Indicators: Aching Pain Intervention(s): Limited activity within patient's tolerance;Monitored during session;Repositioned    Home Living Family/patient expects to be discharged to:: Private residence Living Arrangements: Other relatives Available Help at Discharge: Family;Available 24 hours/day Type of Home: House Home Access: Ramped entrance     Home Layout: One level Home Equipment: None      Prior  Function Level of Independence: Independent         Comments: Reports he is independent, however, can only tolerate short ambulation distances.      Hand Dominance        Extremity/Trunk Assessment   Upper  Extremity Assessment Upper Extremity Assessment: Defer to OT evaluation    Lower Extremity Assessment Lower Extremity Assessment: Generalized weakness;RLE deficits/detail;LLE deficits/detail RLE Deficits / Details: Able to perform ankle pumps, however, drags RLE during gait. Suspect increased tone with gait.  LLE Deficits / Details: Able to perform ankle pumps, however, drags RLE during gait. Suspect increased tone with gait.     Cervical / Trunk Assessment Cervical / Trunk Assessment: Normal  Communication   Communication: No difficulties  Cognition Arousal/Alertness: Awake/alert Behavior During Therapy: WFL for tasks assessed/performed Overall Cognitive Status: Within Functional Limits for tasks assessed                                        General Comments General comments (skin integrity, edema, etc.): Educated about managing fatigue at home and importance of rest breaks to prevent further fatigue and MS exacerbations. Educated about outpatient neuro PT recommendations and pt agreeable.     Exercises     Assessment/Plan    PT Assessment Patient needs continued PT services  PT Problem List Decreased strength;Decreased balance;Decreased activity tolerance;Decreased mobility;Decreased coordination;Decreased knowledge of use of DME;Decreased safety awareness;Decreased knowledge of precautions;Pain       PT Treatment Interventions DME instruction;Gait training;Functional mobility training;Therapeutic activities;Balance training;Therapeutic exercise;Neuromuscular re-education;Patient/family education    PT Goals (Current goals can be found in the Care Plan section)  Acute Rehab PT Goals Patient Stated Goal: to get better  PT Goal Formulation: With patient Time For Goal Achievement: 04/18/17 Potential to Achieve Goals: Good    Frequency Min 3X/week   Barriers to discharge        Co-evaluation               AM-PAC PT "6 Clicks" Daily Activity   Outcome Measure Difficulty turning over in bed (including adjusting bedclothes, sheets and blankets)?: A Little Difficulty moving from lying on back to sitting on the side of the bed? : A Little Difficulty sitting down on and standing up from a chair with arms (e.g., wheelchair, bedside commode, etc,.)?: Unable Help needed moving to and from a bed to chair (including a wheelchair)?: A Little Help needed walking in hospital room?: A Little Help needed climbing 3-5 steps with a railing? : A Lot 6 Click Score: 15    End of Session Equipment Utilized During Treatment: Gait belt Activity Tolerance: Patient tolerated treatment well Patient left: in chair;with call bell/phone within reach;Other (comment)(lab staff in room ) Nurse Communication: Mobility status PT Visit Diagnosis: Unsteadiness on feet (R26.81);Difficulty in walking, not elsewhere classified (R26.2);Other symptoms and signs involving the nervous system (R29.898);Pain Pain - part of body: (generalized from chest down to toes )    Time: [id number] PT Time Calculation (min) (ACUTE ONLY): 26 min   Charges:   PT Evaluation $PT Eval Moderate Complexity: 1 Mod PT Treatments $Gait Training: 8-22 mins   PT G Codes:   PT G-Codes **NOT FOR INPATIENT CLASS** Functional Assessment Tool Used: AM-PAC 6 Clicks Basic Mobility;Clinical judgement Functional Limitation: Mobility: Walking and moving around Mobility: Walking and Moving Around Current Status ([id number]): At least 40 percent but less than 60 percent impaired, limited  or restricted Mobility: Walking and Moving Around Goal Status [phone]): At least 20 percent but less than 40 percent impaired, limited or restricted    Gladys Damme, PT, DPT  Acute Rehabilitation Services  Pager: [phone]   Lehman Prom 04/11/2017, 12:07 PM

## 2017-04-11 NOTE — Care Management Obs Status (Signed)
MEDICARE OBSERVATION STATUS NOTIFICATION   Patient Details  Name: [name] MRN: [id number] Date of Birth: 1980-07-30   Medicare Observation Status Notification Given:  Yes    Lawerance Sabal, RN 04/11/2017, 3:04 PM

## 2017-04-12 ENCOUNTER — Ambulatory Visit: Payer: Self-pay | Admitting: Neurology

## 2017-04-12 DIAGNOSIS — R350 Frequency of micturition: Secondary | ICD-10-CM | POA: Diagnosis not present

## 2017-04-12 DIAGNOSIS — M62838 Other muscle spasm: Secondary | ICD-10-CM | POA: Diagnosis not present

## 2017-04-12 DIAGNOSIS — R29810 Facial weakness: Secondary | ICD-10-CM | POA: Diagnosis present

## 2017-04-12 DIAGNOSIS — F17210 Nicotine dependence, cigarettes, uncomplicated: Secondary | ICD-10-CM | POA: Diagnosis present

## 2017-04-12 DIAGNOSIS — G8191 Hemiplegia, unspecified affecting right dominant side: Secondary | ICD-10-CM | POA: Diagnosis present

## 2017-04-12 DIAGNOSIS — D72829 Elevated white blood cell count, unspecified: Secondary | ICD-10-CM | POA: Diagnosis present

## 2017-04-12 DIAGNOSIS — D72828 Other elevated white blood cell count: Secondary | ICD-10-CM | POA: Diagnosis not present

## 2017-04-12 DIAGNOSIS — M792 Neuralgia and neuritis, unspecified: Secondary | ICD-10-CM | POA: Diagnosis not present

## 2017-04-12 DIAGNOSIS — I10 Essential (primary) hypertension: Secondary | ICD-10-CM | POA: Diagnosis present

## 2017-04-12 DIAGNOSIS — E8809 Other disorders of plasma-protein metabolism, not elsewhere classified: Secondary | ICD-10-CM | POA: Diagnosis not present

## 2017-04-12 DIAGNOSIS — Z720 Tobacco use: Secondary | ICD-10-CM | POA: Diagnosis not present

## 2017-04-12 DIAGNOSIS — E538 Deficiency of other specified B group vitamins: Secondary | ICD-10-CM | POA: Diagnosis not present

## 2017-04-12 DIAGNOSIS — R531 Weakness: Secondary | ICD-10-CM | POA: Diagnosis not present

## 2017-04-12 DIAGNOSIS — R7303 Prediabetes: Secondary | ICD-10-CM | POA: Diagnosis not present

## 2017-04-12 DIAGNOSIS — G35 Multiple sclerosis: Secondary | ICD-10-CM | POA: Diagnosis present

## 2017-04-12 DIAGNOSIS — F1210 Cannabis abuse, uncomplicated: Secondary | ICD-10-CM | POA: Diagnosis present

## 2017-04-12 DIAGNOSIS — R739 Hyperglycemia, unspecified: Secondary | ICD-10-CM | POA: Diagnosis not present

## 2017-04-12 DIAGNOSIS — E559 Vitamin D deficiency, unspecified: Secondary | ICD-10-CM | POA: Diagnosis present

## 2017-04-12 DIAGNOSIS — Z820 Family history of epilepsy and other diseases of the nervous system: Secondary | ICD-10-CM | POA: Diagnosis not present

## 2017-04-12 DIAGNOSIS — T380X5A Adverse effect of glucocorticoids and synthetic analogues, initial encounter: Secondary | ICD-10-CM | POA: Diagnosis present

## 2017-04-12 DIAGNOSIS — R208 Other disturbances of skin sensation: Secondary | ICD-10-CM | POA: Diagnosis not present

## 2017-04-12 DIAGNOSIS — F329 Major depressive disorder, single episode, unspecified: Secondary | ICD-10-CM | POA: Diagnosis present

## 2017-04-12 DIAGNOSIS — H547 Unspecified visual loss: Secondary | ICD-10-CM | POA: Diagnosis present

## 2017-04-12 DIAGNOSIS — E1165 Type 2 diabetes mellitus with hyperglycemia: Secondary | ICD-10-CM | POA: Diagnosis present

## 2017-04-12 LAB — CBC
HEMATOCRIT: 38.8 % — AB (ref 39.0–52.0)
Hemoglobin: 13.1 g/dL (ref 13.0–17.0)
MCH: 29 pg (ref 26.0–34.0)
MCHC: 33.8 g/dL (ref 30.0–36.0)
MCV: 86 fL (ref 78.0–100.0)
PLATELETS: 247 10*3/uL (ref 150–400)
RBC: 4.51 MIL/uL (ref 4.22–5.81)
RDW: 13.2 % (ref 11.5–15.5)
WBC: 12.8 10*3/uL — AB (ref 4.0–10.5)

## 2017-04-12 LAB — COMPREHENSIVE METABOLIC PANEL
ALBUMIN: 3.5 g/dL (ref 3.5–5.0)
ALT: 15 U/L — ABNORMAL LOW (ref 17–63)
ANION GAP: 7 (ref 5–15)
AST: 18 U/L (ref 15–41)
Alkaline Phosphatase: 67 U/L (ref 38–126)
BILIRUBIN TOTAL: 0.5 mg/dL (ref 0.3–1.2)
BUN: 10 mg/dL (ref 6–20)
CHLORIDE: 108 mmol/L (ref 101–111)
CO2: 23 mmol/L (ref 22–32)
Calcium: 9.2 mg/dL (ref 8.9–10.3)
Creatinine, Ser: 0.85 mg/dL (ref 0.61–1.24)
GFR calc Af Amer: >60 mL/min (ref >60)
GFR calc non Af Amer: >60 mL/min (ref >60)
GLUCOSE: 126 mg/dL — AB (ref 65–99)
POTASSIUM: 3.8 mmol/L (ref 3.5–5.1)
Sodium: 138 mmol/L (ref 135–145)
TOTAL PROTEIN: 6.5 g/dL (ref 6.5–8.1)

## 2017-04-12 LAB — CK: CK TOTAL: 98 U/L (ref 49–397)

## 2017-04-12 LAB — GLUCOSE, CAPILLARY: Glucose-Capillary: 122 mg/dL — ABNORMAL HIGH (ref 65–99)

## 2017-04-12 LAB — VITAMIN D 25 HYDROXY (VIT D DEFICIENCY, FRACTURES): VIT D 25 HYDROXY: 14.9 ng/mL — AB (ref 30.0–100.0)

## 2017-04-12 MED ORDER — PANTOPRAZOLE SODIUM 40 MG IV SOLR
40.0000 mg | INTRAVENOUS | Status: DC
  Administered 2017-04-12 – 2017-04-13 (×2): 40 mg via INTRAVENOUS
  Filled 2017-04-12 (×2): qty 40

## 2017-04-12 MED ORDER — VITAMIN D 1000 UNITS PO TABS
2000.0000 [IU] | ORAL_TABLET | Freq: Every day | ORAL | Status: DC
  Administered 2017-04-12 – 2017-04-17 (×6): 2000 [IU] via ORAL
  Filled 2017-04-12 (×6): qty 2

## 2017-04-12 MED ORDER — SODIUM CHLORIDE 0.9 % IV SOLN
1000.0000 mg | Freq: Every day | INTRAVENOUS | Status: AC
  Administered 2017-04-12 – 2017-04-15 (×4): 1000 mg via INTRAVENOUS
  Filled 2017-04-12 (×5): qty 8

## 2017-04-12 NOTE — Progress Notes (Signed)
   Subjective: Patient says he feels about the same as yesterday reports of some decreased vision in his left eye some blurriness, continued right sided weakness.    Objective:  Vital signs in last 24 hours: Vitals:   04/11/17 0931 04/11/17 2140 04/12/17 0453 04/12/17 1058  BP: 123/74 (!) 100/52 126/67 (!) 124/92  Pulse: 65 (!) 50 60 86  Resp: (!) 22 20 18 18   Temp:  99.4 F (37.4 C) 98.4 F (36.9 C) 98.1 F (36.7 C)  TempSrc:  Oral Oral Oral  SpO2: 100% 100% 99% 100%  Weight:   172 lb 9.9 oz (78.3 kg)   Height:       Physical Exam  Constitutional: He is oriented to person, place, and time. He appears well-developed and well-nourished.  HENT:  Head: Normocephalic and atraumatic.  Eyes: EOM are normal. Pupils are equal, round, and reactive to light. Right eye exhibits no discharge. Left eye exhibits no discharge. No scleral icterus.  Cardiovascular: Normal rate, regular rhythm, normal heart sounds and intact distal pulses. Exam reveals no gallop and no friction rub.  No murmur heard. Pulmonary/Chest: Effort normal and breath sounds normal. No respiratory distress. He has no wheezes. He has no rales.  Abdominal: Soft. Bowel sounds are normal. He exhibits no distension and no mass. There is no tenderness. There is no guarding.  Musculoskeletal: He exhibits no edema or deformity.  Neurological: He is alert and oriented to person, place, and time.  4/5 strength in RLE and RUE 5/5 strength in LLE and LUE     Assessment/Plan:  Active Problems:   Multiple sclerosis (HCC)   Multiple sclerosis exacerbation (HCC)  Multiple sclerosis flare w/right sided weakness:  . MRI of the brain and cervical spine shows significant progression of the white matter disease in the colossoseptal margin and increase in demyelination since 2014. There is also progressive white matter disease in the spinal cord at the C2 level. However there are no enhancing lesions.    -Neurology following and  appreciate recs -IV solumedrol 1 g daily for 3-5 days  -T spine MRI today -ck normal, B12 normal, TSH normal, Vit D low--repleting,  -PT and OT for possible rehabilitation following this hospitalization  -will need his follow up appointment rescheduled with his neurologist to continue or adjust maintenance therapy if necessary -will need smoking cessation    DM- Looks like pt is prediabetic -will monitor morning CBGs     Smoking smokes 0.5 ppd of cigarettes, reports 1Lb of marijuana use per week between him and his friend.   -will need counseling on smoking cessation as smoking can make MS worse    Depression- says depression well controlled. Not currently on any SSRIs   Dispo: Anticipated discharge in approximately 2-3 day(s).   Angelita Ingles, MD 04/12/2017, 2:38 PM Thornell Mule MD PGY-1 Internal Medicine Pager # [phone]

## 2017-04-12 NOTE — Progress Notes (Signed)
Subjective: At this point he has no complaints.  He is actually asking to have more than 3 doses of steroids.  Exam: Vitals:   04/11/17 2140 04/12/17 0453  BP: (!) 100/52 126/67  Pulse: (!) 50 60  Resp: 20 18  Temp: 99.4 F (37.4 C) 98.4 F (36.9 C)  SpO2: 100% 99%    HEENT-  Normocephalic, no lesions, without obvious abnormality.  Normal external eye and conjunctiva.  Normal TM's bilaterally.  Normal auditory canals and external ears. Normal external nose, mucus membranes and septum.  Normal pharynx. Cardiovascular- S1, S2 normal, pulses palpable throughout   Lungs- chest clear, no wheezing, rales, normal symmetric air entry, Heart exam - S1, S2 normal, no murmur, no gallop, rate regular Abdomen- normal findings: bowel sounds normal Extremities- no edema Lymph-no adenopathy palpable Musculoskeletal-no joint tenderness, deformity or swelling Skin-warm and dry, no hyperpigmentation, vitiligo, or suspicious lesions   Neuro:  CN: Pupils are equal and round. They are symmetrically reactive from 3-->2 mm. EOMI without nystagmus. Facial sensation is intact to light touch. Face is symmetric at rest with normal strength and mobility. Hearing is intact to conversational voice. Palate elevates symmetrically and uvula is midline. Voice is normal in tone, pitch and quality. Bilateral SCM and trapezii are 5/5. Tongue is midline with normal bulk and mobility.  Motor:  Bilateral upper extremity is 5/5.  Right lower extremity is a 4/5.  I did not notice any increased tone today.  Left lower extremity is a 5/5.  Bilateral dorsiflexion plantarflexion is 5/5. Sensation: Mild decreased sensation in his right leg DTRs: 2+, symmetric going to on the right with downgoing toe on the left   Medications:  Scheduled: . cholecalciferol  2,000 Units Oral Daily  . enoxaparin (LOVENOX) injection  40 mg Subcutaneous Q24H    Pertinent Labs/Diagnostics: White blood cell 12.8 likely secondary to steroids  Mr  [name] Wo Contrast  Result Date: 04/11/2017 CLINICAL DATA:  Multiple sclerosis progressive difficulty over the last year particularly in the last 3-5 days. Increasing and shortness of breath. Multiple falls. EXAM: MRI HEAD WITHOUT AND WITH CONTRAST MRI CERVICAL SPINE WITHOUT AND WITH CONTRAST TECHNIQUE: Multiplanar, multiecho pulse sequences of the brain and surrounding structures, and cervical spine, to include the craniocervical junction and cervicothoracic junction, were obtained without and with intravenous contrast. CONTRAST:  15mL MULTIHANCE GADOBENATE DIMEGLUMINE 529 MG/ML IV SOLN COMPARISON:  MRI of the brain 10/25/2012. MRI of cervical spine 05/22/2016. FINDINGS: MRI HEAD FINDINGS Brain: Numerous periventricular T2 hyperintensities are typical of multiple sclerosis. There are areas of increased rain signal on the T2 sequences within the corona radiata bilaterally. Other subcortical hyperintensities are present bilaterally. A focal area of cortical hyperintensity is present within the left parietal lobe with restricted diffusion. T2 shine through is present on the diffusion-weighted images associated with multiple other lesions. A lesion in the right frontal operculum measures 8 mm. There is extensive disease extending into the brainstem. There is been significant progression of disease since 2014. The peripherally T2 hyperintense lesions in the corona radiata have both increased significantly in size. There is progressive disease in the posterior limb of the internal capsule bilaterally. Brainstem disease has progressed significantly. Extensive disease along the colossoseptal margin has significantly progressed. The postcontrast images demonstrate no pathologic enhancement. Multiple white matter plaques are well seen on the T1 weighted images. The ventricles are of normal size. No significant extra-axial fluid collection is present. Vascular: Flow is present in the major intracranial arteries. Skull and  upper  cervical spine: The skullbase is within normal limits. Craniocervical junction is open. The upper cervical spine is within normal limits. Apart from white matter disease, midline sagittal structures are within normal limits. Sinuses/Orbits: Multiple polyps or mucous retention cysts are present in the maxillary sinuses bilaterally, unchanged. Globes and orbits are within normal limits Mr Cervical Spine Grant Wo Contrast  Result Date: 12/12/201  IMPRESSION: 1. Significant progression vocal white matter disease involving the colossoseptal margin compatible with multiple sclerosis and significant increase in demyelination since 2014. 2. Restricted diffusion involving cortical or subcortical lesion in the left parietal lobe may reflect acute demyelination. 3. No other focal restricted diffusion or enhancement. 4. Progressive periventricular and brainstem disease. 5. Progressive white matter disease within the spinal cord, particularly at the C2 level. Electronically Signed   By: Marin Roberts M.D.   On: 04/11/2017 07:05     Felicie Morn PA-C [facility] [phone]  Impression: Possible MS exacerbation.  Still awaiting MRI of his thoracic cord to evaluate for possible active lesion.   Recommendations: 1) at this time patient will receive 5 days of 1 g Solu-Medrol.  Today will be day 2.  I have started him on Protonix 40 mg IV forgot protection 2) PT, OT 3) Counsel smoking cessation as this can worsen MS 4) MRI thoracic spine Grant/wo contrast    04/12/2017, 10:35 AM

## 2017-04-13 ENCOUNTER — Inpatient Hospital Stay (HOSPITAL_COMMUNITY): Payer: Medicare HMO

## 2017-04-13 LAB — BASIC METABOLIC PANEL
Anion gap: 8 (ref 5–15)
BUN: 13 mg/dL (ref 6–20)
CALCIUM: 9.2 mg/dL (ref 8.9–10.3)
CO2: 24 mmol/L (ref 22–32)
Chloride: 107 mmol/L (ref 101–111)
Creatinine, Ser: 0.86 mg/dL (ref 0.61–1.24)
GFR calc Af Amer: >60 mL/min (ref >60)
GLUCOSE: 140 mg/dL — AB (ref 65–99)
POTASSIUM: 4 mmol/L (ref 3.5–5.1)
Sodium: 139 mmol/L (ref 135–145)

## 2017-04-13 LAB — CBC
HCT: 38.9 % — ABNORMAL LOW (ref 39.0–52.0)
Hemoglobin: 12.9 g/dL — ABNORMAL LOW (ref 13.0–17.0)
MCH: 28.7 pg (ref 26.0–34.0)
MCHC: 33.2 g/dL (ref 30.0–36.0)
MCV: 86.4 fL (ref 78.0–100.0)
PLATELETS: 225 10*3/uL (ref 150–400)
RBC: 4.5 MIL/uL (ref 4.22–5.81)
RDW: 13.4 % (ref 11.5–15.5)
WBC: 10.6 10*3/uL — ABNORMAL HIGH (ref 4.0–10.5)

## 2017-04-13 LAB — GLUCOSE, CAPILLARY: Glucose-Capillary: 110 mg/dL — ABNORMAL HIGH (ref 65–99)

## 2017-04-13 MED ORDER — GADOBENATE DIMEGLUMINE 529 MG/ML IV SOLN
15.0000 mL | Freq: Once | INTRAVENOUS | Status: AC
  Administered 2017-04-13: 15 mL via INTRAVENOUS

## 2017-04-13 MED ORDER — GABAPENTIN 600 MG PO TABS
300.0000 mg | ORAL_TABLET | Freq: Three times a day (TID) | ORAL | Status: DC
  Administered 2017-04-13 – 2017-04-15 (×6): 300 mg via ORAL
  Filled 2017-04-13 (×6): qty 1

## 2017-04-13 NOTE — Progress Notes (Signed)
   Subjective: Patient says he feels about the same as yesterday reports of some decreased vision in his left eye some blurriness, continued right sided weakness.    Objective:  Vital signs in last 24 hours: Vitals:   04/12/17 1058 04/12/17 1644 04/13/17 0542 04/13/17 1027  BP: (!) 124/92 135/80 127/64 (!) 169/86  Pulse: 86 72 88 60  Resp: 18 18 19 18   Temp: 98.1 F (36.7 C) 98.2 F (36.8 C) 98.6 F (37 C) 98.9 F (37.2 C)  TempSrc: Oral Oral Oral Oral  SpO2: 100% 100% 100% 100%  Weight:      Height:       Physical Exam  Constitutional: He is oriented to person, place, and time. He appears well-developed and well-nourished.  HENT:  Head: Normocephalic and atraumatic.  Eyes: EOM are normal. Pupils are equal, round, and reactive to light. Right eye exhibits no discharge. Left eye exhibits no discharge. No scleral icterus.  Cardiovascular: Normal rate, regular rhythm, normal heart sounds and intact distal pulses. Exam reveals no gallop and no friction rub.  No murmur heard. Pulmonary/Chest: Effort normal and breath sounds normal. No respiratory distress. He has no wheezes. He has no rales.  Abdominal: Soft. Bowel sounds are normal. He exhibits no distension and no mass. There is no tenderness. There is no guarding.  Musculoskeletal: He exhibits no edema or deformity.  Neurological: He is alert and oriented to person, place, and time.  4/5 strength in RLE and RUE 5/5 strength in LLE and LUE     Assessment/Plan:  Principal Problem:   Multiple sclerosis exacerbation (HCC) Active Problems:   Multiple sclerosis (HCC)   Marijuana abuse, continuous   Vitamin D deficiency  Multiple sclerosis flare w/right sided weakness:  . MRI of the brain and cervical spine shows significant progression of the white matter disease in the colossoseptal margin and increase in demyelination since 2014. There is also progressive white matter disease in the spinal cord at the C2 level. However there  are no enhancing lesions.    -Neurology following and appreciate recs -IV solumedrol 1 g daily for 5 days  -T spine MRI showed chronic demyelination of T2 and T9-T10, no new enhancing lesions -ck normal, B12 normal, TSH normal, Vit D low--repleting,  -PT and OT for possible rehabilitation following this hospitalization  -will need his follow up appointment rescheduled with his neurologist to continue or adjust maintenance therapy if necessary -will need smoking cessation    DM- Looks like pt is prediabetic -will monitor morning CBGs     Smoking smokes 0.5 ppd of cigarettes, reports 1Lb of marijuana use per week between him and his friend.   -will need counseling on smoking cessation as smoking can make MS worse    Depression- says depression well controlled. Not currently on any SSRIs   Dispo: Anticipated discharge in approximately 2-3 day(s).   Angelita InglesWinfrey, [name], MD 04/13/2017, 1:47 PM [name] MD PGY-1 Internal Medicine Pager # [phone]

## 2017-04-13 NOTE — Progress Notes (Signed)
Subjective: No change.  Patient's only complaint today is that he has some pain however he states his not significant.  Exam: Vitals:   04/13/17 0542 04/13/17 1027  BP: 127/64 (!) 169/86  Pulse: 88 60  Resp: 19 18  Temp: 98.6 F (37 C) 98.9 F (37.2 C)  SpO2: 100% 100%    HEENT-  Normocephalic, no lesions, without obvious abnormality.  Normal external eye and conjunctiva.  Normal TM's bilaterally.  Normal auditory canals and external ears. Normal external nose, mucus membranes and septum.  Normal pharynx. Cardiovascular- S1, S2 normal, pulses palpable throughout   Lungs- chest clear, no wheezing, rales, normal symmetric air entry, Heart exam - S1, S2 normal, no murmur, no gallop, rate regular Abdomen- normal findings: bowel sounds normal Extremities- no edema Lymph-no adenopathy palpable Musculoskeletal-no joint tenderness, deformity or swelling Skin-warm and dry, no hyperpigmentation, vitiligo, or suspicious lesions    Neuro:  NP:YYFRTM are equal and round. They are symmetrically reactive from 3-->2 mm. EOMI without nystagmus. Facial sensation is intact to light touch. Face is symmetric at rest with normal strength and mobility. Hearing is intact to conversational voice. Palate elevates symmetrically and uvula is midline. Voice is normal in tone, pitch and quality. Bilateral SCM and trapezii are 5/5. Tongue is midline with normal bulk and mobility.  Motor: Bilateral upper extremity is 5/5.  Right lower extremity is a 4/5.  I did not notice any increased tone today.  Left lower extremity is a 5/5.  Bilateral dorsiflexion plantarflexion is 5/5. Sensation: Mild decreased sensation in his right leg DTRs:2+, symmetric going to on the right with downgoing toe on the left   Medications:  Scheduled: . cholecalciferol  2,000 Units Oral Daily  . enoxaparin (LOVENOX) injection  40 mg Subcutaneous Q24H  . pantoprazole (PROTONIX) IV  40 mg Intravenous Q24H    Pertinent  Labs/Diagnostics:   Mr Thoracic Spine W Wo Contrast  Result Date: 04/13/2017 CLINICAL DATA:  Multiple sclerosis exacerbation. Worsening shortness of breath and falling. EXAM: MRI THORACIC WITHOUT AND WITH CONTRAST TECHNIQUE: Multiplanar and multiecho pulse sequences of the thoracic spine were obtained without and with intravenous contrast. CONTRAST:  35mL MULTIHANCE GADOBENATE DIMEGLUMINE 529 MG/ML IV SOLN COMPARISON:  Brain and cervical studies done 2 days ago. FINDINGS: MRI THORACIC SPINE FINDINGS Alignment:  Normal Vertebrae: Normal Cord: Abnormal T2 signal within the cord at the T9-10 level consistent with demyelinating disease. No swelling or enhancement. Paraspinal and other soft tissues: Negative Disc levels: No degenerative disease, stenosis or neural compression. Moderate degradation by motion. IMPRESSION: Area of abnormal T2 signal affecting the cord at the T9-10 level consistent with demyelinating disease at this location. No cord swelling or enhancement. Electronically Signed   By: Paulina Fusi M.D.   On: 04/13/2017 10:18     Felicie Morn PA-C [facility] [phone]  Impression: This 36 year old male with MS exacerbation however does not show any enhancement on MRI.  Patient has received 3/5 doses of methylprednisolone.   Recommendations: 1) at this time patient will receive 5 days of 1 g Solu-Medrol.  Today will be day 3.  2) PT, OT 3) Counsel smoking cessation as this can worsen MS      04/13/2017, 11:42 AM

## 2017-04-13 NOTE — Progress Notes (Signed)
Rehab Admissions Coordinator Note:  Patient was screened by [name] for appropriateness for an Inpatient Acute Rehab Consult per PT recommendation.  At this time, we are recommending Inpatient Rehab consult. Please place an order for a consult if pt would like to be considered for an admission.   [name] 04/13/2017, 4:36 PM  I can be reached at [phone].

## 2017-04-13 NOTE — Progress Notes (Signed)
Internal Medicine Attending:   I saw and examined the patient. I reviewed the resident's note and I agree with the resident's findings and plan as documented in the resident's note. No change in symptoms, continues to have right side weakness.  T spine MRI shows chronic demyelination no new lesions.  He is currently treated with IV solumedrol per neurology recommendations.

## 2017-04-13 NOTE — Progress Notes (Signed)
Notified of pt desire to leave the floor in order to smoke a cigarette. On discussion with pt, he was adamant that he was going to smoke with his brother outside. I explained this was a significant safety issue given his weakness/MS leading to a significant fall risk, and that the medical team would strongly recommend remaining on the floor for ongoing care and attention. He was offered a nicotine patch to help with cravings which he declined. He stated he would leave the floor regardless of our medical opinion and would sign any paperwork acknowledging the risk. He demonstrated capacity and was able to repeat back the recommendations and reasoning provided. He clarified that he was not seeking to be discharged, only to step outside for a brief period. The case was discussed with the nurse who stated no formal paperwork is required. We are not able to hold him to the floor against his will and he was provided an ambulatory assistance device in an effort to improve safety. The family was present for these discussions and are also aware of the risk and medical recommendation.

## 2017-04-13 NOTE — Progress Notes (Signed)
Physical Therapy Treatment Patient Details Name: [name] MRN: [id number] DOB: 1980-05-09 Today's Date: 04/13/2017    History of Present Illness Pt is a 36 y/o male admitted secondary to increased weakness and found to have an MS exacerbation. MRI of the brain and cervical spine shows significant progression of the white matter disease in the colossoseptal margin and increase in demyelination since 2014. There is also progressive white matter disease in the spinal cord at the C2 level. However there are no enhancing lesions. PMH including but not limited to MS, depression, HTN and DM.    PT Comments    Pt continues to demonstrate significant balance deficits putting him at a high risk for falls. Pt already admitting that he falls at least 6-7 times daily. Pt with greatly improved stability with ambulation while using RW but still required min A. Without AD, pt required mod A and with several LOB. Pt would greatly benefit from further intensive therapies in CIR to maximize his independence with functional mobility prior to returning home with family support. PT will continue to follow and progress mobility as tolerated.    Follow Up Recommendations  CIR;Supervision/Assistance - 24 hour     Equipment Recommendations  None recommended by PT    Recommendations for Other Services Rehab consult     Precautions / Restrictions Precautions Precautions: Fall Precaution Comments: Pt reports he falls 6 or 7 times a day secondary to foot drag.  Restrictions Weight Bearing Restrictions: No    Mobility  Bed Mobility Overal bed mobility: Modified Independent                Transfers Overall transfer level: Needs assistance Equipment used: None Transfers: Sit to/from Stand Sit to Stand: Min guard         General transfer comment: verbal cueing for safety  Ambulation/Gait Ambulation/Gait assistance: Min assist;Mod assist Ambulation Distance (Feet): 200 Feet(200' without AD;  200' x2 with RW and standing rest breaks) Assistive device: None;Rolling walker (2 wheeled) Gait Pattern/deviations: Step-through pattern;Decreased stride length;Decreased dorsiflexion - right;Decreased dorsiflexion - left;Shuffle Gait velocity: Decreased  Gait velocity interpretation: Below normal speed for age/gender General Gait Details: pt with very unsteady gait especially without use of an AD (initially pt refusing to use RW) and required mod A and frequently reaching for handrails/support surfaces; pt with greatly improved stability with use of RW and only occasionally required min A for stability   Stairs            Wheelchair Mobility    Modified Rankin (Stroke Patients Only)       Balance Overall balance assessment: Needs assistance Sitting-balance support: No upper extremity supported;Feet supported Sitting balance-Leahy Scale: Good     Standing balance support: During functional activity;No upper extremity supported Standing balance-Leahy Scale: Poor Standing balance comment: can stand statically without UE support, dynamically very poor without UE supports                            Cognition Arousal/Alertness: Awake/alert Behavior During Therapy: WFL for tasks assessed/performed Overall Cognitive Status: Within Functional Limits for tasks assessed                                        Exercises      General Comments        Pertinent Vitals/Pain Pain Assessment: Faces Faces Pain  Scale: Hurts little more Pain Location: bilateral LEs Pain Descriptors / Indicators: Sore Pain Intervention(s): Monitored during session;Repositioned    Home Living                      Prior Function            PT Goals (current goals can now be found in the care plan section) Acute Rehab PT Goals PT Goal Formulation: With patient Time For Goal Achievement: 04/18/17 Potential to Achieve Goals: Good Progress towards PT goals:  Progressing toward goals    Frequency    Min 4X/week      PT Plan Discharge plan needs to be updated;Frequency needs to be updated    Co-evaluation              AM-PAC PT "6 Clicks" Daily Activity  Outcome Measure  Difficulty turning over in bed (including adjusting bedclothes, sheets and blankets)?: None Difficulty moving from lying on back to sitting on the side of the bed? : None Difficulty sitting down on and standing up from a chair with arms (e.g., wheelchair, bedside commode, etc,.)?: Unable Help needed moving to and from a bed to chair (including a wheelchair)?: A Little Help needed walking in hospital room?: A Lot Help needed climbing 3-5 steps with a railing? : A Lot 6 Click Score: 16    End of Session Equipment Utilized During Treatment: Gait belt Activity Tolerance: Patient tolerated treatment well Patient left: in bed;with call bell/phone within reach;with bed alarm set Nurse Communication: Mobility status PT Visit Diagnosis: Unsteadiness on feet (R26.81);Difficulty in walking, not elsewhere classified (R26.2);Other symptoms and signs involving the nervous system (R29.898);Pain Pain - Right/Left: (bilateral) Pain - part of body: Leg     Time: 1441-1541 PT Time Calculation (min) (ACUTE ONLY): 60 min  Charges:  $Gait Training: 23-37 mins $Therapeutic Activity: 23-37 mins                    G Codes:       Seven SpringsJennifer Anthonia Monger, [place]PT, [place]PT [phone] 2012    Alessandra BevelsJennifer M Atley Neubert 04/13/2017, 4:32 PM

## 2017-04-14 DIAGNOSIS — R7303 Prediabetes: Secondary | ICD-10-CM

## 2017-04-14 DIAGNOSIS — R531 Weakness: Secondary | ICD-10-CM

## 2017-04-14 LAB — CBC
HCT: 39.9 % (ref 39.0–52.0)
HEMOGLOBIN: 13.2 g/dL (ref 13.0–17.0)
MCH: 28.5 pg (ref 26.0–34.0)
MCHC: 33.1 g/dL (ref 30.0–36.0)
MCV: 86.2 fL (ref 78.0–100.0)
Platelets: 222 10*3/uL (ref 150–400)
RBC: 4.63 MIL/uL (ref 4.22–5.81)
RDW: 13.3 % (ref 11.5–15.5)
WBC: 10.6 10*3/uL — ABNORMAL HIGH (ref 4.0–10.5)

## 2017-04-14 LAB — BASIC METABOLIC PANEL
Anion gap: 6 (ref 5–15)
BUN: 12 mg/dL (ref 6–20)
CHLORIDE: 104 mmol/L (ref 101–111)
CO2: 28 mmol/L (ref 22–32)
CREATININE: 0.77 mg/dL (ref 0.61–1.24)
Calcium: 9.2 mg/dL (ref 8.9–10.3)
GFR calc Af Amer: >60 mL/min (ref >60)
GFR calc non Af Amer: >60 mL/min (ref >60)
GLUCOSE: 135 mg/dL — AB (ref 65–99)
Potassium: 4.1 mmol/L (ref 3.5–5.1)
SODIUM: 138 mmol/L (ref 135–145)

## 2017-04-14 LAB — GLUCOSE, CAPILLARY: GLUCOSE-CAPILLARY: 124 mg/dL — AB (ref 65–99)

## 2017-04-14 MED ORDER — PANTOPRAZOLE SODIUM 40 MG PO TBEC
40.0000 mg | DELAYED_RELEASE_TABLET | Freq: Every day | ORAL | Status: DC
  Administered 2017-04-14 – 2017-04-17 (×4): 40 mg via ORAL
  Filled 2017-04-14 (×4): qty 1

## 2017-04-14 MED ORDER — NICOTINE 21 MG/24HR TD PT24
21.0000 mg | MEDICATED_PATCH | Freq: Every day | TRANSDERMAL | Status: DC
  Administered 2017-04-14 – 2017-04-17 (×4): 21 mg via TRANSDERMAL
  Filled 2017-04-14 (×4): qty 1

## 2017-04-14 NOTE — Evaluation (Signed)
Occupational Therapy Evaluation Patient Details Name: [name] S Helin MRN: [id number] DOB: [birth year] Today's Date: 04/14/2017    History of Present Illness Pt is a 36 y/o male admitted secondary to increased weakness and found to have an MS exacerbation. MRI of the brain and cervical spine shows significant progression of the white matter disease in the colossoseptal margin and increase in demyelination since 2014. There is also progressive white matter disease in the spinal cord at the C2 level. However there are no enhancing lesions. PMH including but not limited to MS, depression, HTN and DM.   Clinical Impression   Pt admitted secondary to problem listed above.  PTA, pt was independent but does report multiple falls at home throughout the day.  During evaluation, pt requiring min assist for ADLs and functional mobility for steadying and safety. Recommend CIR to maximize independence and safety with ADLs prior to return home. Will continue to follow acutely in order to address deficits listed below.     Follow Up Recommendations  CIR    Equipment Recommendations  (defer to next venue of care)    Recommendations for Other Services Rehab consult     Precautions / Restrictions Precautions Precautions: Fall Precaution Comments: Pt reports he falls 6 or 7 times a day secondary to foot drag.  Restrictions Weight Bearing Restrictions: No      Mobility Bed Mobility Overal bed mobility: Modified Independent                Transfers Overall transfer level: Needs assistance Equipment used: Rolling walker (2 wheeled) Transfers: Sit to/from Stand Sit to Stand: Min guard         General transfer comment: verbal cueing for safety    Balance Overall balance assessment: Needs assistance Sitting-balance support: No upper extremity supported;Feet supported Sitting balance-Leahy Scale: Good     Standing balance support: During functional activity;No upper extremity  supported Standing balance-Leahy Scale: Poor                             ADL either performed or assessed with clinical judgement   ADL Overall ADL's : Needs assistance/impaired Eating/Feeding: Independent;Sitting   Grooming: Minimal assistance;Standing;Wash/dry hands;Wash/dry face;Applying deodorant   Upper Body Bathing: Set up;Supervision/ safety;Sitting   Lower Body Bathing: Minimal assistance;Sit to/from stand   Upper Body Dressing : Set up;Supervision/safety;Sitting   Lower Body Dressing: Minimal assistance;Sit to/from stand   Toilet Transfer: Minimal assistance;RW   Toileting- Clothing Manipulation and Hygiene: Min guard;Sit to/from stand       Functional mobility during ADLs: Minimal assistance;Rolling walker General ADL Comments: Pt donning and doffing socks without assist while sitting EOB.  Requires assist for standing components of ADLs due to unsteadiness on feet.  Assist for safe use of RW during functional mobility. Able to open containers without difficulty.     Vision         Perception     Praxis      Pertinent Vitals/Pain Pain Assessment: No/denies pain     Hand Dominance     Extremity/Trunk Assessment Upper Extremity Assessment Upper Extremity Assessment: RUE deficits/detail;LUE deficits/detail RUE Deficits / Details: 4/5 MMT LUE Deficits / Details: MMT WNL           Communication Communication Communication: No difficulties   Cognition Arousal/Alertness: Awake/alert Behavior During Therapy: WFL for tasks assessed/performed Overall Cognitive Status: Impaired/Different from baseline Area of Impairment: Safety/judgement  Safety/Judgement: Decreased awareness of safety;Decreased awareness of deficits         General Comments  Discussed rehab as option before returning home.     Exercises     Shoulder Instructions      Home Living Family/patient expects to be discharged to:: Private  residence Living Arrangements: Alone;Non-relatives/Friends Available Help at Discharge: Family;Available 24 hours/day Type of Home: House Home Access: Ramped entrance     Home Layout: One level     Bathroom Shower/Tub: Chief Strategy Officer: Standard     Home Equipment: None          Prior Functioning/Environment Level of Independence: Independent        Comments: Reports he is independent, however, can only tolerate short ambulation distances.         OT Problem List: Decreased strength;Decreased activity tolerance;Impaired balance (sitting and/or standing);Decreased safety awareness;Decreased knowledge of use of DME or AE;Decreased knowledge of precautions;Impaired UE functional use      OT Treatment/Interventions: Self-care/ADL training;Therapeutic exercise;Neuromuscular education;DME and/or AE instruction;Energy conservation;Therapeutic activities;Cognitive remediation/compensation;Patient/family education;Balance training    OT Goals(Current goals can be found in the care plan section) Acute Rehab OT Goals Patient Stated Goal: to get better  OT Goal Formulation: With patient Time For Goal Achievement: 04/28/17 Potential to Achieve Goals: Good ADL Goals Pt Will Perform Lower Body Bathing: with modified independence;sit to/from stand Pt Will Perform Lower Body Dressing: with modified independence;sit to/from stand Pt Will Transfer to Toilet: with modified independence;ambulating;regular height toilet Pt Will Perform Toileting - Clothing Manipulation and hygiene: with modified independence;sit to/from stand Pt Will Perform Tub/Shower Transfer: Tub transfer;with modified independence;ambulating Additional ADL Goal #1: Pt will retrieve ADL objects around room at mod I level without LOB.  OT Frequency: Min 3X/week   Barriers to D/C: Decreased caregiver support          Co-evaluation              AM-PAC PT "6 Clicks" Daily Activity     Outcome  Measure Help from another person eating meals?: None Help from another person taking care of personal grooming?: A Little Help from another person toileting, which includes using toliet, bedpan, or urinal?: A Little Help from another person bathing (including washing, rinsing, drying)?: A Little Help from another person to put on and taking off regular upper body clothing?: A Little Help from another person to put on and taking off regular lower body clothing?: A Little 6 Click Score: 19   End of Session Equipment Utilized During Treatment: Gait belt;Rolling walker Nurse Communication: Mobility status  Activity Tolerance: Patient tolerated treatment well Patient left: in bed;with call bell/phone within reach;with bed alarm set  OT Visit Diagnosis: Unsteadiness on feet (R26.81);Other abnormalities of gait and mobility (R26.89);Repeated falls (R29.6);Muscle weakness (generalized) (M62.81);History of falling (Z91.81)                Time: 9622-2979 OT Time Calculation (min): 25 min Charges:  OT General Charges $OT Visit: 1 Visit OT Evaluation $OT Eval Moderate Complexity: 1 Mod OT Treatments $Self Care/Home Management : 8-22 mins G-Codes:       Cipriano Mile OTR/L 04/14/2017, 2:02 PM

## 2017-04-14 NOTE — Progress Notes (Signed)
  Date: 04/14/2017  Patient name: [name]  Medical record number: [id number]  Date of birth: [birth year]   I have seen and evaluated this patient and I have discussed the plan of care with the house staff. Please see their note for complete details. I concur with their findings with the following additions/corrections: Mr Folan repeatedly apologizes for smoking in bathroom and now has nictotene patch.  Burns Spain, MD 04/14/2017, 3:13 PM

## 2017-04-14 NOTE — Progress Notes (Signed)
Patient acknowledges that he has had multiple falls and is a high fall risk, but continues to get up without calling for assistance.

## 2017-04-14 NOTE — Progress Notes (Signed)
Patient requesting to leave unit to smoke. This RN informed the patient that leaving the unit to smoke is not allowed. MD on call aware. Patient stated the rules are "dumb" and to discharge him. I informed the patient that he wasn't being discharged but he can sign an AMA form if he wants leave. Patient stated he wasn't signing. Informed the patient he must remain on the unit.   Avelina Laine RN

## 2017-04-14 NOTE — Progress Notes (Signed)
Patient feels that he is improving, he had less pain with ambulation today.  He is in fact asking for an additional day of steroids which I informed him it would not be a possibility.  On strength assessment, he has a mild right hemiparesis  I had a long talk with him about the dangers of smoking and multiple sclerosis.  At this time he may need continued therapy.  Ritta Slot, MD [facility] [phone]  If 7pm- 7am, please page neurology on call as listed in AMION.

## 2017-04-14 NOTE — Progress Notes (Signed)
Patients room smelled like smoke, patient admits he was smoking in the bathroom. Multiple staff members have discussed smoking policy with patient and risk associated with smoking in hospitals rooms. Cigarettes and 2 lighters were taken from the room and put at nurses station.

## 2017-04-14 NOTE — Progress Notes (Signed)
At approximately 2300, this RN notified that patient was putting pants on in order to go outside to smoke with brother.  [facility].  I expressed my concern about his unsteady gait and the risk for fall and injury.  He states he has MS and he falls several times a day and this is norm.  He offered to sign a waiver stating we would not be responsible if he fell and was injured.  He stated he was an adult and knew what he was doing.  I made MD aware of the issue.  Patient left with brother via wheelchair (instead of walker) at 2332 and returned back to the floor at 2348.  Per MD, telemetry was d/c'd.  Will continue to monitor patient.  Bernie CoveyKimberly Silas Sedam RN-BC, CitigroupWTA

## 2017-04-14 NOTE — Progress Notes (Signed)
   Subjective:  Patient endorses feeling significantly better today in general. Weakness is unchanged but he was able to walk easier this morning.   Patient endorsed going out to smoke overnight with brother and then smoking half a cigarette in his hospital room bathroom. He apologizes for this.    Objective:  Vital signs in last 24 hours: Vitals:   04/13/17 1854 04/13/17 2148 04/14/17 0554 04/14/17 1008  BP: 134/86 126/63 129/85 131/73  Pulse: 63 (!) 51 64 78  Resp: 18 16 18 18   Temp: 98.6 F (37 C) 98.5 F (36.9 C) 98.6 F (37 C) 98.3 F (36.8 C)  TempSrc: Oral Oral Oral Oral  SpO2: 100% 98% 100% 100%  Weight:      Height:       Constitutional: NAD CV: RRR, no murmurs, rubs or gallops Resp: CTAB on anterior lung fields, no increased work of breathing Abd: soft, NDNT MSK: RU/LE 4/5 strength, LU/LE 5/5 strength  Assessment/Plan:  Principal Problem:   Multiple sclerosis exacerbation (HCC) Active Problems:   Multiple sclerosis (HCC)   Marijuana abuse, continuous   Vitamin D deficiency  Multiple sclerosis flare w/right sided weakness: MRI of the brain and cervical spine shows significant progression of the white matter disease in the colossoseptal margin and increase in demyelination since 2014. There is also progressive white matter disease in the spinal cord at the C2 level. T spine MRI showed chronic demyelination of T2 and T9-T10, no new enhancing lesionsHowever there are no enhancing lesions. Neurology following and appreciate recs --IV solumedrol 1 g daily for 5 days (day 4/5)  --vit D supplementation --CIR consult placed --will need his follow up appointment rescheduled with his neurologist to continue or adjust maintenance therapy if necessary   Pre-DM --will monitor morning CBGs    Tobacco use disorder: Patient continues to smoke; reiterated risk of smoking IN the hospital and for worsening/eliciting MS flares. Patient agreed to nicotine patch, apologizes for  smoking in the hospital and will avoid this in future. No concrete plans to quit smoking longterm though. --nicotine patch --continue encouraging cessation   Dispo: Anticipated discharge in approximately 2-3 day(s).   Nyra Market, MD 04/14/2017, 1:36 PM Pager # [phone]

## 2017-04-15 LAB — GLUCOSE, CAPILLARY: Glucose-Capillary: 200 mg/dL — ABNORMAL HIGH (ref 65–99)

## 2017-04-15 MED ORDER — SODIUM CHLORIDE 0.9 % IV SOLN
325.0000 mg | Freq: Once | INTRAVENOUS | Status: AC
  Administered 2017-04-15: 330 mg via INTRAVENOUS
  Filled 2017-04-15: qty 2.64

## 2017-04-15 MED ORDER — GABAPENTIN 600 MG PO TABS: 300.0000 mg | ORAL_TABLET | Freq: Three times a day (TID) | ORAL | Status: DC | PRN

## 2017-04-15 NOTE — Discharge Summary (Signed)
Name: [name] MRN: [id number] DOB: Sep 25, 1980 36 y.o. PCP: Lonie Peak, PA-C  Date of Admission: 04/11/2017 12:31 AM Date of Discharge: 04/17/2017 Attending Physician: Anne Shutter, MD  Discharge Diagnosis: 1. Multiple sclerosis exacerbation Principal Problem:   Multiple sclerosis exacerbation (HCC) Active Problems:   Multiple sclerosis (HCC)   Marijuana abuse, continuous   Vitamin D deficiency   Tobacco abuse   Steroid-induced hyperglycemia   Discharge Medications: Allergies as of 04/17/2017   No Known Allergies     Medication List    TAKE these medications   Cholecalciferol 2000 units Tabs Take 1 tablet (2,000 Units total) by mouth daily. Start taking on:  04/18/2017   nicotine 21 mg/24hr patch Commonly known as:  NICODERM CQ - dosed in mg/24 hours Place 1 patch (21 mg total) onto the skin daily. Start taking on:  04/18/2017       Disposition and follow-up:   [name] was discharged from [facility] in Good condition.  At the hospital follow up visit please address:  1.   MS exacerbation -pt will need to follow up with his neurologist and continue maintenance therapy -gabapentin 300mg  TID PRN for pain  Tobacco use disorder -started on nicotine patch 21mg  daily  -continue encouraging cessation for decreased risk of MS flares and general health as well  Vitamin D deficiency in MS: --started on Vit D 2000 units daily --consider rechecking Vit D level in 6-8 weeks if consistently taking for dose adjustment if necessary  2.  Labs / imaging needed at time of follow-up: consider Vit D level in 6-8wks  3.  Pending labs/ test needing follow-up: n/a  Follow-up Appointments: Needs f/u with his neurologist in 1-2 weeks after discharge  Hospital Course by problem list: Principal Problem:   Multiple sclerosis exacerbation (HCC) Active Problems:   Multiple sclerosis (HCC)   Marijuana abuse, continuous   Vitamin D  deficiency   Tobacco abuse   Steroid-induced hyperglycemia   Multiple sclerosis exacerbation: Patient admitted on 04/11/17 through [facility] ED after evaluation of complaints of progressive weakness for 5 days. He reported using a cane previously but has had worsening weakness and imbalance even with this. Weakness was mainly found to be in his right upper and lower extremities. Neurology evaluated patient in the ED and started him on 5 day course of IV solumedrol for MS exacerbation. MRI brain and cervical spine on admission revealed progressive white matter disease compared to previous imaging in 2014, as well as a possible site of active demyelination in his left parietal lobe. MRI thoracic spine was obtained a couple of days later (to reduce contrast load) which revealed demyelinating disease at the T9-10 level. On admission he was also found to be deficient of Vit D and was started on supplementation - 2000 units daily - as normalizing vit D level has the potential to reduce frequency and severity of MS flares. Patient was extensively counseled on tobacco cessation for reducing MS flares and for promotion of his general health. He was started on a nicotine patch. Patient completed his 5 day course of IV solumedrol without complications. He was evaluated by PT/OT who recommended a inpatient rehab to continue increasing patient's strength and independence. Patient needs to follow up with his neurologist at discharge to begin maintenance therapy for his MS.  Vit D deficiency: Started on 2000IU daily. Consider repeating lab in 6-8 weeks for dose titration if taking regularly.  Tobacco use disorder: Counseled on cessation,  especially in setting of his MS. He was started on a nicotine patch while inpatient and this should be continued during inpatient rehab and discharge if patient agreeable.   Discharge Vitals:   BP 131/69 (BP Location: Right Arm)   Pulse (!) 58   Temp 98.2 F (36.8 C) (Oral)    Resp 18   Ht 5\' 10"  (1.778 m)   Wt 173 lb 1 oz (78.5 kg)   SpO2 100%   BMI 24.83 kg/m   Pertinent Labs, Studies, and Procedures:  CBC Latest Ref Rng & Units 04/14/2017 04/13/2017 04/12/2017  WBC 4.0 - 10.5 K/uL 10.6(H) 10.6(H) 12.8(H)  Hemoglobin 13.0 - 17.0 g/dL 78.6 12.9(L) 13.1  Hematocrit 39.0 - 52.0 % 39.9 38.9(L) 38.8(L)  Platelets 150 - 400 K/uL 222 225 247   BMP Latest Ref Rng & Units 04/14/2017 04/13/2017 04/12/2017  Glucose 65 - 99 mg/dL 767(M) 094(B) 096(G)  BUN 6 - 20 mg/dL 12 13 10   Creatinine 0.61 - 1.24 mg/dL 8.36 6.29 4.76  BUN/Creat Ratio 9 - 20 - - -  Sodium 135 - 145 mmol/L 138 139 138  Potassium 3.5 - 5.1 mmol/L 4.1 4.0 3.8  Chloride 101 - 111 mmol/L 104 107 108  CO2 22 - 32 mmol/L 28 24 23   Calcium 8.9 - 10.3 mg/dL 9.2 9.2 9.2   Vit D, 54-YTKPTWS 04/11/17: 14.9  MRI brain, MRI cervical spine w/wo contrast 04/11/2017: 1. Significant progression vocal white matter disease involving the colossoseptal margin compatible with multiple sclerosis and significant increase in demyelination since 2014. 2. Restricted diffusion involving cortical or subcortical lesion in the left parietal lobe may reflect acute demyelination. 3. No other focal restricted diffusion or enhancement. 4. Progressive periventricular and brainstem disease. 5. Progressive white matter disease within the spinal cord, particularly at the C2 level.  MRI thoracic spine w/wo contrast 04/13/2017: Area of abnormal T2 signal affecting the cord at the T9-10 level consistent with demyelinating disease at this location. No cord swelling or enhancement.  Discharge Instructions: Discharge Instructions    Call MD for:  difficulty breathing, headache or visual disturbances   Complete by:  As directed    Call MD for:  extreme fatigue   Complete by:  As directed    Call MD for:  hives   Complete by:  As directed    Call MD for:  persistant dizziness or light-headedness   Complete by:  As directed      Call MD for:  persistant nausea and vomiting   Complete by:  As directed    Call MD for:  redness, tenderness, or signs of infection (pain, swelling, redness, odor or green/yellow discharge around incision site)   Complete by:  As directed    Call MD for:  severe uncontrolled pain   Complete by:  As directed    Call MD for:  temperature >100.4   Complete by:  As directed    Diet - low sodium heart healthy   Complete by:  As directed    Discharge instructions   Complete by:  As directed    For low vitamin D, take Vit D 2000 units daily.  For help with smoking cessation, use nicotine patch 21mg  daily for 6 weeks, then 14mg  for 2 weeks, then 7mg  for 2 weeks. Stopping smoking will help decrease risks of MS flares in the future.  Please follow up with neurology in 1-2 weeks after discharge from inpatient rehab.   Increase activity slowly   Complete by:  As directed       Signed: Nyra MarketSvalina, Ellyssa Zagal, MD 04/17/2017, 2:02 PM

## 2017-04-15 NOTE — Progress Notes (Signed)
Patient disconnected IV during methylprednisolone administration, contaminating IV line. 18 mLs left on pump, new bag with remaining dose ordered via pharmacy.

## 2017-04-15 NOTE — Progress Notes (Signed)
   Subjective:  Patient endorses feeling like he is continually improving.  Spoke with him about the possibility of inpatient rehab, he feels like this would be very helpful.   Objective:  Vital signs in last 24 hours: Vitals:   04/14/17 1741 04/14/17 2153 04/15/17 0622 04/15/17 0836  BP: 132/74 132/76 (!) 144/83 116/83  Pulse: 75 69 62 81  Resp: 16 17 18 16   Temp: 98 F (36.7 C) 98.2 F (36.8 C) 98.4 F (36.9 C) 98.3 F (36.8 C)  TempSrc: Oral Oral Oral Oral  SpO2: 99% 100% 99% 100%  Weight:      Height:       Constitutional: NAD CV: RRR, no murmurs, rubs or gallops Resp: CTAB on anterior lung fields, no increased work of breathing Abd: soft, NDNT MSK: RU/LE 4+/5 strength, LU/LE 5/5 strength  Assessment/Plan:  Principal Problem:   Multiple sclerosis exacerbation (HCC) Active Problems:   Multiple sclerosis (HCC)   Marijuana abuse, continuous   Vitamin D deficiency  Multiple sclerosis flare w/right sided weakness: MRI of the brain and cervical spine shows significant progression of the white matter disease in the colossoseptal margin and increase in demyelination since 2014. There is also progressive white matter disease in the spinal cord at the C2 level. T spine MRI showed chronic demyelination of T2 and T9-T10, no new enhancing lesionsHowever there are no enhancing lesions. Neurology following and appreciate recs --IV solumedrol 1 g daily for 5 days (day 5/5)  --vit D supplementation --CIR consult placed --will need his follow up appointment rescheduled with his neurologist to continue or adjust maintenance therapy if necessary   Pre-DM --will monitor morning CBGs    Tobacco use disorder: Patient continues to smoke; reiterated risk of smoking IN the hospital and for worsening/eliciting MS flares. Patient agreed to nicotine patch, apologizes for smoking in the hospital and will avoid this in future. No concrete plans to quit smoking longterm though. --nicotine  patch --continue encouraging cessation   Dispo: Anticipated discharge in approximately 1-2 day(s).   Angelita Ingles, MD 04/15/2017, 11:07 AM Pager # [phone]

## 2017-04-15 NOTE — Progress Notes (Addendum)
Subjective: Overall feels he is improving, but still with some gait difficulties.   Exam: Vitals:   04/15/17 0622 04/15/17 0836  BP: (!) 144/83 116/83  Pulse: 62 81  Resp: 18 16  Temp: 98.4 F (36.9 C) 98.3 F (36.8 C)  SpO2: 99% 100%   Gen: In bed, NAD Resp: non-labored breathing, no acute distress Abd: soft, nt  Neuro: MS: awake, alert interactive and appropriate ZO:XWRUCN:PERR, VFF Motor: Mild right hemiparesis.  Sensory:intact to LT  Impression: 36 yo M with worsening of gait. With no enhancement on imaging, difficult to say if the acute worsening was true flare. He is improving. He describe a pain that sounded like L'hermittes but he is not sure if gabapentin is helping, so I will make it PRN so he can try with and without it.   Recommendations: 1) Finish IV solumedrol today.  2) PT 3) gabapentin 300mg  TID PRN, if helping he could have this as an outpatient.  4) Follow up with Dr. Terrace ArabiaYan.  5) Please call with further questions or concerns.   Ritta SlotMcNeill Bibi Economos, MD [facility] [phone]  If 7pm- 7am, please page neurology on call as listed in AMION.

## 2017-04-16 DIAGNOSIS — D72829 Elevated white blood cell count, unspecified: Secondary | ICD-10-CM

## 2017-04-16 DIAGNOSIS — G35 Multiple sclerosis: Principal | ICD-10-CM

## 2017-04-16 DIAGNOSIS — I10 Essential (primary) hypertension: Secondary | ICD-10-CM

## 2017-04-16 DIAGNOSIS — F1210 Cannabis abuse, uncomplicated: Secondary | ICD-10-CM

## 2017-04-16 DIAGNOSIS — Z720 Tobacco use: Secondary | ICD-10-CM

## 2017-04-16 DIAGNOSIS — T380X5A Adverse effect of glucocorticoids and synthetic analogues, initial encounter: Secondary | ICD-10-CM

## 2017-04-16 DIAGNOSIS — D72828 Other elevated white blood cell count: Secondary | ICD-10-CM

## 2017-04-16 DIAGNOSIS — R739 Hyperglycemia, unspecified: Secondary | ICD-10-CM

## 2017-04-16 LAB — GLUCOSE, CAPILLARY: Glucose-Capillary: 179 mg/dL — ABNORMAL HIGH (ref 65–99)

## 2017-04-16 NOTE — Consult Note (Signed)
Physical Medicine and Rehabilitation Consult Reason for Consult: Decreased functional mobility Referring Physician: [facility]   HPI: [name] is a 37 y.o. right handed male with history of hypertension, multiple sclerosis diagnosed 2010 followed by Dr. Terrace Arabia with history of recent exacerbations. Per chart review and patient, patient lives cousins. Reported to be independent ambulating only short distances and still driving. One level home with ramped entrance. Family in the area checks on him as needed. Presented 04/11/2017 with increasing weakness over the past 5 days as well as blurring of vision. Urine drug screen positive for marijuana. MRI the brain, reviewed showing progressive disease.  Per report, MRI brain,  thoracic and cervical spine showed significant progression focal white matter disease involving the colossoseptal margin compatible with multiple sclerosis and significant increase in demyelination since 2014. Progressive periventricular and brainstem disease. Progression with white matter disease within the spinal cord particularly at the C2 level. Neurology consulted for exacerbation of multiple sclerosis placed on intravenous Solu-Medrol 5 days completed 04/15/2017. Subcutaneous Lovenox for DVT prophylaxis. Physical and occupational therapy evaluations completed with recommendations of physical medicine rehabilitation consult.   Review of Systems  Constitutional: Negative for chills and fever.  HENT: Negative for hearing loss.   Eyes: Positive for blurred vision.  Respiratory: Negative for cough and shortness of breath.   Cardiovascular: Negative for chest pain, palpitations and leg swelling.  Gastrointestinal: Positive for constipation. Negative for nausea.  Genitourinary: Positive for urgency. Negative for dysuria and hematuria.  Musculoskeletal: Positive for joint pain and myalgias.  Skin: Negative for rash.  Neurological: Positive for dizziness, speech change, focal  weakness and weakness. Negative for seizures.  Psychiatric/Behavioral: Positive for depression.  All other systems reviewed and are negative.  Past Medical History:  Diagnosis Date  . Depression   . Diabetes mellitus without complication (HCC)   . Eye abnormalities    unspecified by pt  . Hypertension   . Multiple sclerosis (HCC)   . Weakness    generalized   Past Surgical History:  Procedure Laterality Date  . WISDOM TOOTH EXTRACTION     Family History  Adopted: Yes  Problem Relation Age of Onset  . Multiple sclerosis Cousin   . Heart disease Mother   . Other Father        Does not know his father.   Social History:  reports that he has been smoking cigarettes.  He has been smoking about 1.00 pack per day. he has never used smokeless tobacco. He reports that he drinks about 7.2 oz of alcohol per week. He reports that he uses drugs. Drug: Marijuana. Frequency: 30.00 times per week. Allergies: No Known Allergies No medications prior to admission.    Home: Home Living Family/patient expects to be discharged to:: Private residence Living Arrangements: Alone, Non-relatives/Friends Available Help at Discharge: Family, Available 24 hours/day Type of Home: House Home Access: Ramped entrance Home Layout: One level Bathroom Shower/Tub: Engineer, manufacturing systems: Standard Home Equipment: None  Functional History: Prior Function Level of Independence: Independent Comments: Reports he is independent, however, can only tolerate short ambulation distances.  Functional Status:  Mobility: Bed Mobility Overal bed mobility: Modified Independent General bed mobility comments: Increased time, however, no assist required.  Transfers Overall transfer level: Needs assistance Equipment used: Rolling walker (2 wheeled) Transfers: Sit to/from Stand Sit to Stand: Min guard General transfer comment: verbal cueing for safety Ambulation/Gait Ambulation/Gait assistance: Min assist,  Mod assist Ambulation Distance (Feet): 200 Feet(200' without AD; 200'  x2 with RW and standing rest breaks) Assistive device: None, Rolling walker (2 wheeled) Gait Pattern/deviations: Step-through pattern, Decreased stride length, Decreased dorsiflexion - right, Decreased dorsiflexion - left, Shuffle General Gait Details: pt with very unsteady gait especially without use of an AD (initially pt refusing to use RW) and required mod A and frequently reaching for handrails/support surfaces; pt with greatly improved stability with use of RW and only occasionally required min A for stability Gait velocity: Decreased  Gait velocity interpretation: Below normal speed for age/gender    ADL: ADL Overall ADL's : Needs assistance/impaired Eating/Feeding: Independent, Sitting Grooming: Minimal assistance, Standing, Wash/dry hands, Wash/dry face, Applying deodorant Upper Body Bathing: Set up, Supervision/ safety, Sitting Lower Body Bathing: Minimal assistance, Sit to/from stand Upper Body Dressing : Set up, Supervision/safety, Sitting Lower Body Dressing: Minimal assistance, Sit to/from stand Toilet Transfer: Minimal assistance, RW Toileting- Clothing Manipulation and Hygiene: Min guard, Sit to/from stand Functional mobility during ADLs: Minimal assistance, Rolling walker General ADL Comments: Pt donning and doffing socks without assist while sitting EOB.  Requires assist for standing components of ADLs due to unsteadiness on feet.  Assist for safe use of RW during functional mobility. Able to open containers without difficulty.  Cognition: Cognition Overall Cognitive Status: Impaired/Different from baseline Orientation Level: Oriented X4 Cognition Arousal/Alertness: Awake/alert Behavior During Therapy: WFL for tasks assessed/performed Overall Cognitive Status: Impaired/Different from baseline Area of Impairment: Safety/judgement Safety/Judgement: Decreased awareness of safety, Decreased awareness  of deficits  Blood pressure 134/73, pulse 86, temperature 98.2 F (36.8 C), temperature source Oral, resp. rate 17, height 5\' 10"  (1.778 m), weight 78.3 kg (172 lb 9.9 oz), SpO2 98 %. Physical Exam  Vitals reviewed. Constitutional: He is oriented to person, place, and time. He appears well-developed and well-nourished.  HENT:  Head: Normocephalic and atraumatic.  Eyes: EOM are normal. Right eye exhibits no discharge. Left eye exhibits no discharge.  Neck: Normal range of motion. Neck supple. No thyromegaly present.  Cardiovascular: Normal rate, regular rhythm and normal heart sounds.  Respiratory: Effort normal and breath sounds normal. No respiratory distress.  GI: Soft. Bowel sounds are normal. He exhibits no distension.  Musculoskeletal: He exhibits no edema or tenderness.  Neurological: He is alert and oriented to person, place, and time.  Motor: LUE/LLE: 4+/5 proximal to distal RLE/RLE: 4-/5 proximal to distal Sensation intact to light touch  Skin: Skin is warm and dry.  Psychiatric: He has a normal mood and affect. His behavior is normal.    Results for orders placed or performed during the hospital encounter of 04/11/17 (from the past 24 hour(s))  Glucose, capillary     Status: Abnormal   Collection Time: 04/15/17  8:45 AM  Result Value Ref Range   Glucose-Capillary 200 (H) 65 - 99 mg/dL   No results found.  Assessment/Plan: Diagnosis: MS exacerbation Labs and images independently reviewed.  Records reviewed and summated above.  1. Does the need for close, 24 hr/day medical supervision in concert with the patient's rehab needs make it unreasonable for this patient to be served in a less intensive setting? Potentially  2. Co-Morbidities requiring supervision/potential complications: marijuana and tobacco abuse (counsel), HTN (monitor and provide prns in accordance with increased physical exertion and pain), multiple sclerosis (consider restarting maintanance therapy),  steroid induced hyperglycemia (Monitor in accordance with exercise and adjust meds as necessary), leukocytosis (cont to monitor for signs and symptoms of infection, further workup if indicated) 3. Due to safety, disease management and patient education, does the patient  require 24 hr/day rehab nursing? Potentially 4. Does the patient require coordinated care of a physician, rehab nurse, PT (1-2 hrs/day, 5 days/week) and OT (1-2 hrs/day, 5 days/week) to address physical and functional deficits in the context of the above medical diagnosis(es)? Potentially Addressing deficits in the following areas: balance, endurance, locomotion, strength, bathing, dressing, toileting and psychosocial support 5. Can the patient actively participate in an intensive therapy program of at least 3 hrs of therapy per day at least 5 days per week? Yes 6. The potential for patient to make measurable gains while on inpatient rehab is excellent 7. Anticipated functional outcomes upon discharge from inpatient rehab are modified independent  with PT, modified independent with OT, n/a with SLP. 8. Estimated rehab length of stay to reach the above functional goals is: 5-9 days. 9. Anticipated D/C setting: Home 10. Anticipated post D/C treatments: HH therapy and Home excercise program 11. Overall Rehab/Functional Prognosis: good  RECOMMENDATIONS: This patient's condition is appropriate for continued rehabilitative care in the following setting: Patient appears to making functional progress and states he can "pretty much do whatever I need to do".  Recommend reeval by therapies and if deficits persist recommend CIR, however, pt needs to be willing to participate with CIR. Patient has agreed to participate in recommended program. Potentially Note that insurance prior authorization may be required for reimbursement for recommended care.  Comment: Rehab Admissions Coordinator to follow up.  Maryla Morrow, MD, ABPMR Mcarthur Rossetti Angiulli,  PA-C 04/16/2017

## 2017-04-16 NOTE — Progress Notes (Signed)
Occupational Therapy Treatment Patient Details Name: TIMARI SUCHANEK MRN: [id number] DOB: 1981-01-04 Today's Date: 04/16/2017    History of present illness Pt is a 36 y/o male admitted secondary to increased weakness and found to have an MS exacerbation. MRI of the brain and cervical spine shows significant progression of the white matter disease in the colossoseptal margin and increase in demyelination since 2014. There is also progressive white matter disease in the spinal cord at the C2 level. However there are no enhancing lesions. PMH including but not limited to MS, depression, HTN and DM.   OT comments  This 36 yo male admitted with above presents to acute OT with making progress (at a min guard A level for sit>stand and standing with momentarily being able to not have UE support. He static and dynamic balance is still a big issue for safety with basic ADLs and feel he would greatly benefit from CIR level therapies before D/C home.    Follow Up Recommendations  CIR    Equipment Recommendations  Tub/shower seat    Recommendations for Other Services Rehab consult    Precautions / Restrictions Precautions Precautions: Fall Precaution Comments: Pt reports he falls 6 or 7 times a day secondary to foot drag.  Restrictions Weight Bearing Restrictions: No       Mobility Bed Mobility Overal bed mobility: Modified Independent                Transfers Overall transfer level: Needs assistance               General transfer comment: pt did not want to use RW "I'm 36". My response was yes you are 67 but is it really safe to be walking like you are when a RW would make it much safer. He feels like the RW would through his balance off. He ambulated the whole unit (but had to hold on something with one hand almost the whole way and ambulates from his hips with very little knee bending. Footsteps are very heavy.  Several LOB that I had to help him self correct while also tried to  self correct    Balance Overall balance assessment: Needs assistance Sitting-balance support: No upper extremity supported;Feet supported Sitting balance-Leahy Scale: Good     Standing balance support: During functional activity;No upper extremity supported Standing balance-Leahy Scale: Poor Standing balance comment: can only stand statically without UE support for very short periods of time, dynamically very poor without UE supports                           ADL either performed or assessed with clinical judgement   ADL Overall ADL's : Needs assistance/impaired                           Toilet Transfer Details (indicate cue type and reason): upon entering room found pt up in bathroom by himself. Upon coming out of bathroom pt was holding onto door frame then foot board of bed to help him with his balance (I checked and bed alarm was on, but not going off)           General ADL Comments: Pt can doff and don socks while seated EOB with some mild incoordination noted. He is able to stand momentarily without UE support, but then needs to hold onto something with at least one hand     Vision Patient Visual  Report: No change from baseline            Cognition Arousal/Alertness: Awake/alert Behavior During Therapy: WFL for tasks assessed/performed   Area of Impairment: Safety/judgement                         Safety/Judgement: Decreased awareness of safety     General Comments: up in bathroom by himself                   Pertinent Vitals/ Pain       Pain Assessment: No/denies pain         Frequency  Min 3X/week        Progress Toward Goals  OT Goals(current goals can now be found in the care plan section)  Progress towards OT goals: Progressing toward goals     Plan Discharge plan remains appropriate       AM-PAC PT "6 Clicks" Daily Activity     Outcome Measure   Help from another person eating meals?: None Help  from another person taking care of personal grooming?: A Little(set up (sitting); min guard A (standing)) Help from another person toileting, which includes using toliet, bedpan, or urinal?: A Little Help from another person bathing (including washing, rinsing, drying)?: A Little Help from another person to put on and taking off regular upper body clothing?: A Little Help from another person to put on and taking off regular lower body clothing?: A Little 6 Click Score: 19    End of Session Equipment Utilized During Treatment: Gait belt  OT Visit Diagnosis: Unsteadiness on feet (R26.81);Other abnormalities of gait and mobility (R26.89);Repeated falls (R29.6);Muscle weakness (generalized) (M62.81);History of falling (Z91.81)   Activity Tolerance Patient tolerated treatment well   Patient Left with bed alarm set(sitting EOB; reset bed alarm since it appears it did not go off when he got up eariler to bathroom by himself)   Nurse Communication          Time: [id number] OT Time Calculation (min): 15 min  Charges: OT General Charges $OT Visit: 1 Visit OT Treatments $Self Care/Home Management : 8-22 mins 04/16/2017, 1:04 PM Ignacia Palmaathy Kelii Chittum, OTR/L [phone] 04/16/2017

## 2017-04-16 NOTE — Consult Note (Signed)
            [facility] CM Primary Care Navigator  04/16/2017  Val EagleKeith S Kalama July 03, 1980 [id number]   Seen patientat the bedside to identify possible discharge needs.  Patient reports having worsening weakness (Multiple Sclerosis) to bilateral legs resulting to loss of balance and falls which had led to this admission.   Patient Vallery SaendorsesNathan Conroy, PA [facility].    [facility] [place] [facility] [place].  Patient mentioned that he is not taking any medications at home but takes over the counter medications as needed and manages it on his own.   Patient reports that he was driving prior to admission buthe has family members (daughter- Magda Paganiniudrey, cousins, aunts/ uncles) who willprovide transportation to his doctors'appointments when needed. He states that he can also use [employer] or [employer] for transportation if needed.  Patientstates that he lives with cousins and his family will assist with his care needs at home.  [facility] by therapy prior to returning home.   Patient voiced understanding to call primary care provider's office when he returns back home, for a post discharge follow-up within 1-2 weeks or sooner if needs arise. Patient letter (with PCP's contact number) was provided asareminder.   Discussed with patient regarding [facility] CM services available for health management at home but denies any needs or concerns at this time. Heexpressed understanding to seek referral from primary care provider to [facility] if deemed necessary and appropriate for services in the future.   [facility] care management information was provided for future needs that he may have.   For additional questions please contact:  Karin GoldenLorraine A. Julianah Marciel, BSN, RN-BC [facility] PRIMARY CARE Navigator Cell: [phone]

## 2017-04-16 NOTE — Progress Notes (Signed)
Physical Therapy Treatment Patient Details Name: [name] MRN: [id number] DOB: [birth year] Today's Date: 04/16/2017    History of Present Illness Pt is a 36 y/o male admitted secondary to increased weakness and found to have an MS exacerbation. MRI of the brain and cervical spine shows significant progression of the white matter disease in the colossoseptal margin and increase in demyelination since 2014. There is also progressive white matter disease in the spinal cord at the C2 level. However there are no enhancing lesions. PMH including but not limited to MS, depression, HTN and DM.    PT Comments    Patient seen for activity progression. Patient continues to present with significant instability and poor gait function. Patient also with very impaired insight and awareness of deficits.  Continue to feel CIR is most appropriate venue of care for this patient due to impairments, fall history and poor management of condition, however, unsure if patient is receptive or open to the necessary education and treatment plans to make improvements needed to remain safe as patient shows poor insight and safety awareness regarding deficits. May benefit from cognitive assessment.  Follow Up Recommendations  CIR;Supervision/Assistance - 24 hour     Equipment Recommendations  None recommended by PT    Recommendations for Other Services Rehab consult     Precautions / Restrictions Precautions Precautions: Fall Precaution Comments: Pt reports he falls 6 or 7 times a day secondary to foot drag.  Restrictions Weight Bearing Restrictions: No    Mobility  Bed Mobility Overal bed mobility: Modified Independent             General bed mobility comments: Increased time, however, no assist required.   Transfers Overall transfer level: Needs assistance Equipment used: None Transfers: Sit to/from Stand Sit to Stand: Min assist         General transfer comment: patient now willing to use  RW during session, attempted to stand without assist and fell back onto bed x2, min assist for stability in upright. patient very tangential with limited recpetivity into mobility concerns  Ambulation/Gait Ambulation/Gait assistance: Min assist;Mod assist Ambulation Distance (Feet): 160 Feet Assistive device: None(patient staggering into objects reaching for railings/walls) Gait Pattern/deviations: Step-through pattern;Decreased stride length;Decreased dorsiflexion - right;Decreased dorsiflexion - left;Shuffle Gait velocity: Decreased  Gait velocity interpretation: Below normal speed for age/gender General Gait Details: patient extremely unsteady and staggering with mobility, min to moderate assist to prevent falls when patient attempting to mobilize without UE support. Noted RLE extensor tone throughout extremity with poor tone management   Stairs            Wheelchair Mobility    Modified Rankin (Stroke Patients Only)       Balance Overall balance assessment: Needs assistance Sitting-balance support: No upper extremity supported;Feet supported Sitting balance-Leahy Scale: Good     Standing balance support: During functional activity;No upper extremity supported Standing balance-Leahy Scale: Poor Standing balance comment: can only stand statically without UE support for very short periods of time, dynamically very poor without UE supports                            Cognition Arousal/Alertness: Awake/alert Behavior During Therapy: WFL for tasks assessed/performed Overall Cognitive Status: Impaired/Different from baseline Area of Impairment: Safety/judgement                         Safety/Judgement: Decreased awareness of safety  General Comments: patient with decreased insight and awareness regarding condition. Patient at times minimally receptive to assist and guidance at other times appreciative for cues. Patient high fall risk       Exercises      General Comments General comments (skin integrity, edema, etc.): attempted to educate patient on tone management strategies, patient receptive at times, at other times, patient tangential and conflicting with therapist when attempting to educate.      Pertinent Vitals/Pain Pain Assessment: No/denies pain Faces Pain Scale: Hurts little more Pain Location: bilateral LEs Pain Descriptors / Indicators: Sore    Home Living                      Prior Function            PT Goals (current goals can now be found in the care plan section) Acute Rehab PT Goals Patient Stated Goal: to get better  PT Goal Formulation: With patient Time For Goal Achievement: 04/18/17 Potential to Achieve Goals: Good Progress towards PT goals: Progressing toward goals    Frequency    Min 4X/week      PT Plan Discharge plan needs to be updated;Frequency needs to be updated    Co-evaluation              AM-PAC PT "6 Clicks" Daily Activity  Outcome Measure  Difficulty turning over in bed (including adjusting bedclothes, sheets and blankets)?: None Difficulty moving from lying on back to sitting on the side of the bed? : None Difficulty sitting down on and standing up from a chair with arms (e.g., wheelchair, bedside commode, etc,.)?: Unable Help needed moving to and from a bed to chair (including a wheelchair)?: A Little Help needed walking in hospital room?: A Lot Help needed climbing 3-5 steps with a railing? : A Lot 6 Click Score: 16    End of Session Equipment Utilized During Treatment: Gait belt Activity Tolerance: Patient tolerated treatment well Patient left: in bed;with call bell/phone within reach;with bed alarm set Nurse Communication: Mobility status PT Visit Diagnosis: Unsteadiness on feet (R26.81);Difficulty in walking, not elsewhere classified (R26.2);Other symptoms and signs involving the nervous system (R29.898);Pain Pain - Right/Left:  (bilateral) Pain - part of body: Leg     Time: 1035-1056 PT Time Calculation (min) (ACUTE ONLY): 21 min  Charges:  $Gait Training: 8-22 mins                    G Codes:       Charlotte Crumbevon Daphnee Preiss, PT DPT  Board Certified Neurologic Specialist [phone]    Fabio AsaDevon J Ritesh Opara 04/16/2017, 2:52 PM

## 2017-04-16 NOTE — Progress Notes (Signed)
   Subjective:  Patient endorses feeling about the same.  Mentioned the incident with disconnecting his last dose of IV solumedrol to use the bathroom.  Continues to want to work with rehab to get more strength and balance.  Objective:  Vital signs in last 24 hours: Vitals:   04/15/17 1851 04/15/17 2128 04/16/17 0613 04/16/17 0842  BP: 130/64 134/85 134/73 132/71  Pulse: 66 93 86 64  Resp: 16 16 17 18   Temp: 98 F (36.7 C) 97.6 F (36.4 C) 98.2 F (36.8 C) 98.5 F (36.9 C)  TempSrc: Oral Oral Oral Oral  SpO2: 97% 97% 98% 100%  Weight:      Height:       Constitutional: NAD CV: RRR, no murmurs, rubs or gallops Resp: CTAB on anterior lung fields, no increased work of breathing Abd: soft, NDNT MSK: RU/LE 4/5 strength, LU/LE 5/5 strength  Assessment/Plan:  Principal Problem:   Multiple sclerosis exacerbation (HCC) Active Problems:   Multiple sclerosis (HCC)   Marijuana abuse, continuous   Vitamin D deficiency  Multiple sclerosis flare w/right sided weakness: MRI of the brain and cervical spine shows significant progression of the white matter disease in the colossoseptal margin and increase in demyelination since 2014. There is also progressive white matter disease in the spinal cord at the C2 level. T spine MRI showed chronic demyelination of T2 and T9-T10, no new enhancing lesionsHowever there are no enhancing lesions. Neurology following and appreciate recs --IV solumedrol 1 g daily for 5 days (day 5/5)  --vit D supplementation --CIR consult placed they will evaluate today --will need his follow up appointment rescheduled with his neurologist to continue or adjust maintenance therapy if necessary   Pre-DM --will monitor morning CBGs    Tobacco use disorder: Patient continues to smoke; reiterated risk of smoking IN the hospital and for worsening/eliciting MS flares. Patient agreed to nicotine patch, apologizes for smoking in the hospital and will avoid this in future. No  concrete plans to quit smoking longterm though. --nicotine patch --continue encouraging cessation   Dispo: Anticipated discharge pending CIR evaluation today.   Angelita Ingles, MD 04/16/2017, 10:46 AM Pager # [phone]

## 2017-04-16 NOTE — Progress Notes (Addendum)
  Date: 04/16/2017  Patient name: [name]  Medical record number: [id number]  Date of birth: 30-Apr-1981   I have seen and evaluated this patient and I have discussed the plan of care with the house staff. Please see their note for complete details.   Symptoms appear to be stable from prior days. We are working towards possible inpatient rehabilitation placement. He expressed his frustrations to [place] this morning with the pace of moving towards rehabilitation, as he feels like we are wasting his time. He has completed his course of IV Solu-Medrol and is ready for discharge soon as we can find placement for him.  Anne Shutter, MD 04/16/2017, 2:02 PM

## 2017-04-17 ENCOUNTER — Inpatient Hospital Stay (HOSPITAL_COMMUNITY)
Admission: RE | Admit: 2017-04-17 | Discharge: 2017-04-25 | DRG: 060 | Disposition: A | Discharge status: Home Health | Payer: Medicare HMO | Source: Intra-hospital | Attending: Physical Medicine & Rehabilitation | Admitting: Physical Medicine & Rehabilitation

## 2017-04-17 ENCOUNTER — Encounter (HOSPITAL_COMMUNITY): Payer: Self-pay

## 2017-04-17 DIAGNOSIS — F329 Major depressive disorder, single episode, unspecified: Secondary | ICD-10-CM | POA: Diagnosis not present

## 2017-04-17 DIAGNOSIS — R4587 Impulsiveness: Secondary | ICD-10-CM | POA: Diagnosis present

## 2017-04-17 DIAGNOSIS — K5900 Constipation, unspecified: Secondary | ICD-10-CM | POA: Diagnosis present

## 2017-04-17 DIAGNOSIS — G35 Multiple sclerosis: Secondary | ICD-10-CM | POA: Diagnosis not present

## 2017-04-17 DIAGNOSIS — E559 Vitamin D deficiency, unspecified: Secondary | ICD-10-CM

## 2017-04-17 DIAGNOSIS — E538 Deficiency of other specified B group vitamins: Secondary | ICD-10-CM | POA: Diagnosis not present

## 2017-04-17 DIAGNOSIS — Z820 Family history of epilepsy and other diseases of the nervous system: Secondary | ICD-10-CM | POA: Diagnosis not present

## 2017-04-17 DIAGNOSIS — M792 Neuralgia and neuritis, unspecified: Secondary | ICD-10-CM | POA: Diagnosis not present

## 2017-04-17 DIAGNOSIS — R208 Other disturbances of skin sensation: Secondary | ICD-10-CM

## 2017-04-17 DIAGNOSIS — R350 Frequency of micturition: Secondary | ICD-10-CM | POA: Diagnosis present

## 2017-04-17 DIAGNOSIS — Z7151 Drug abuse counseling and surveillance of drug abuser: Secondary | ICD-10-CM | POA: Diagnosis not present

## 2017-04-17 DIAGNOSIS — F1290 Cannabis use, unspecified, uncomplicated: Secondary | ICD-10-CM | POA: Diagnosis present

## 2017-04-17 DIAGNOSIS — R404 Transient alteration of awareness: Secondary | ICD-10-CM | POA: Diagnosis not present

## 2017-04-17 DIAGNOSIS — H538 Other visual disturbances: Secondary | ICD-10-CM | POA: Diagnosis present

## 2017-04-17 DIAGNOSIS — Z79899 Other long term (current) drug therapy: Secondary | ICD-10-CM | POA: Diagnosis not present

## 2017-04-17 DIAGNOSIS — E8809 Other disorders of plasma-protein metabolism, not elsewhere classified: Secondary | ICD-10-CM

## 2017-04-17 DIAGNOSIS — R42 Dizziness and giddiness: Secondary | ICD-10-CM | POA: Diagnosis present

## 2017-04-17 DIAGNOSIS — I10 Essential (primary) hypertension: Secondary | ICD-10-CM | POA: Diagnosis not present

## 2017-04-17 DIAGNOSIS — G8911 Acute pain due to trauma: Secondary | ICD-10-CM | POA: Diagnosis not present

## 2017-04-17 DIAGNOSIS — Z9181 History of falling: Secondary | ICD-10-CM

## 2017-04-17 DIAGNOSIS — R531 Weakness: Secondary | ICD-10-CM | POA: Diagnosis not present

## 2017-04-17 DIAGNOSIS — M62838 Other muscle spasm: Secondary | ICD-10-CM | POA: Diagnosis not present

## 2017-04-17 DIAGNOSIS — E119 Type 2 diabetes mellitus without complications: Secondary | ICD-10-CM | POA: Diagnosis not present

## 2017-04-17 DIAGNOSIS — Z716 Tobacco abuse counseling: Secondary | ICD-10-CM | POA: Diagnosis not present

## 2017-04-17 DIAGNOSIS — F17210 Nicotine dependence, cigarettes, uncomplicated: Secondary | ICD-10-CM | POA: Diagnosis present

## 2017-04-17 LAB — CREATININE, SERUM: Creatinine, Ser: 0.98 mg/dL (ref 0.61–1.24)

## 2017-04-17 LAB — CBC
HCT: 41.3 % (ref 39.0–52.0)
Hemoglobin: 14 g/dL (ref 13.0–17.0)
MCH: 29.6 pg (ref 26.0–34.0)
MCHC: 33.9 g/dL (ref 30.0–36.0)
MCV: 87.3 fL (ref 78.0–100.0)
PLATELETS: 208 10*3/uL (ref 150–400)
RBC: 4.73 MIL/uL (ref 4.22–5.81)
RDW: 13.5 % (ref 11.5–15.5)
WBC: 3.6 10*3/uL — AB (ref 4.0–10.5)

## 2017-04-17 LAB — GLUCOSE, CAPILLARY: GLUCOSE-CAPILLARY: 87 mg/dL (ref 65–99)

## 2017-04-17 MED ORDER — ENOXAPARIN SODIUM 40 MG/0.4ML ~~LOC~~ SOLN
40.0000 mg | SUBCUTANEOUS | Status: DC
  Administered 2017-04-18 – 2017-04-24 (×7): 40 mg via SUBCUTANEOUS
  Filled 2017-04-17 (×7): qty 0.4

## 2017-04-17 MED ORDER — CHOLECALCIFEROL 50 MCG (2000 UT) PO TABS: 2000.0000 [IU] | ORAL_TABLET | Freq: Every day | ORAL | Status: DC

## 2017-04-17 MED ORDER — ONDANSETRON HCL 4 MG PO TABS: 4.0000 mg | ORAL_TABLET | Freq: Four times a day (QID) | ORAL | Status: DC | PRN

## 2017-04-17 MED ORDER — NICOTINE 21 MG/24HR TD PT24
21.0000 mg | MEDICATED_PATCH | Freq: Every day | TRANSDERMAL | Status: DC
  Administered 2017-04-18 – 2017-04-20 (×3): 21 mg via TRANSDERMAL
  Filled 2017-04-17 (×7): qty 1

## 2017-04-17 MED ORDER — SORBITOL 70 % SOLN: 30.0000 mL | Freq: Every day | Status: DC | PRN

## 2017-04-17 MED ORDER — ONDANSETRON HCL 4 MG/2ML IJ SOLN: 4.0000 mg | Freq: Four times a day (QID) | INTRAMUSCULAR | Status: DC | PRN

## 2017-04-17 MED ORDER — GABAPENTIN 600 MG PO TABS
300.0000 mg | ORAL_TABLET | Freq: Three times a day (TID) | ORAL | Status: DC | PRN
  Administered 2017-04-18 – 2017-04-21 (×2): 300 mg via ORAL
  Filled 2017-04-17 (×3): qty 1

## 2017-04-17 MED ORDER — ENOXAPARIN SODIUM 40 MG/0.4ML ~~LOC~~ SOLN: 40.0000 mg | SUBCUTANEOUS | Status: DC

## 2017-04-17 MED ORDER — NICOTINE 21 MG/24HR TD PT24: 21.0000 mg | MEDICATED_PATCH | Freq: Every day | TRANSDERMAL | 0 refills | Status: DC

## 2017-04-17 MED ORDER — GABAPENTIN 600 MG PO TABS: 300.0000 mg | ORAL_TABLET | Freq: Three times a day (TID) | ORAL | Status: DC | PRN

## 2017-04-17 MED ORDER — PANTOPRAZOLE SODIUM 40 MG PO TBEC
40.0000 mg | DELAYED_RELEASE_TABLET | Freq: Every day | ORAL | Status: DC
  Administered 2017-04-18 – 2017-04-25 (×8): 40 mg via ORAL
  Filled 2017-04-17 (×8): qty 1

## 2017-04-17 MED ORDER — VITAMIN D 1000 UNITS PO TABS
2000.0000 [IU] | ORAL_TABLET | Freq: Every day | ORAL | Status: DC
  Administered 2017-04-18 – 2017-04-21 (×4): 2000 [IU] via ORAL
  Filled 2017-04-17 (×4): qty 2

## 2017-04-17 NOTE — H&P (Signed)
Physical Medicine and Rehabilitation Admission H&P    Chief Complaint  Patient presents with  . difficulty walking from MS  : HPI: [name] is a 36 y.o. right handed male with history of hypertension, multiple sclerosis diagnosed 2010 followed by Dr. Terrace Arabia with history of recent exacerbations. Patient on no prescription medications prior to admission. Per chart review and patient, patient lives cousins. Reported to be independent ambulating only short distances and still driving. One level home with ramped entrance. Family in the area checks on him as needed. Presented 04/11/2017 with increasing weakness over the past 5 days as well as blurring of vision. Urine drug screen positive for marijuana. MRI the brain, reviewed showing progressive disease.  Per report, MRI brain,  thoracic and cervical spine showed significant progression focal white matter disease involving the colossoseptal margin compatible with multiple sclerosis and significant increase in demyelination since 2014. Progressive periventricular and brainstem disease. Progression with white matter disease within the spinal cord particularly at the C2 level. Neurology consulted for exacerbation of multiple sclerosis placed on intravenous Solu-Medrol 5 days completed 04/15/2017. Subcutaneous Lovenox for DVT prophylaxis. Physical and occupational therapy evaluations completed with recommendations of physical medicine rehabilitation consult. Patient was admitted for a comprehensive rehabilitation program    ROS Constitutional: Negative for chills and fever.  HENT: Negative for hearing loss.   Eyes: Positive for blurred vision.  Respiratory: Negative for cough and shortness of breath.   Cardiovascular: Negative for chest pain, palpitations and leg swelling.  Gastrointestinal: Positive for constipation. Negative for nausea.  Genitourinary: Positive for urgency. Negative for dysuria and hematuria.  Musculoskeletal: Positive for joint  and back pain and myalgias. Denied falls  Skin: Negative for rash.  Neurological: Positive for dizziness, speech change, focal weakness and weakness. Negative for seizures. Negative loss of consciousness Psychiatric/Behavioral: Positive for depression.  All other systems reviewed and are negative   Past Medical History:  Diagnosis Date  . Depression   . Diabetes mellitus without complication (HCC)   . Eye abnormalities    unspecified by pt  . Hypertension   . Multiple sclerosis (HCC)   . Weakness    generalized   Past Surgical History:  Procedure Laterality Date  . WISDOM TOOTH EXTRACTION     Family History  Adopted: Yes  Problem Relation Age of Onset  . Multiple sclerosis Cousin   . Heart disease Mother   . Other Father        Does not know his father.   Social History:  reports that he has been smoking cigarettes.  He has been smoking about 1.00 pack per day. he has never used smokeless tobacco. He reports that he drinks about 7.2 oz of alcohol per week. He reports that he uses drugs. Drug: Marijuana. Frequency: 30.00 times per week. Allergies: No Known Allergies No medications prior to admission.    Drug Regimen Review Drug regimen was reviewed and remains appropriate with no significant issues identified  Home: Home Living Family/patient expects to be discharged to:: Private residence Living Arrangements: Alone, Non-relatives/Friends Available Help at Discharge: Family, Available 24 hours/day Type of Home: House Home Access: Ramped entrance Home Layout: One level Bathroom Shower/Tub: Engineer, manufacturing systems: Standard Home Equipment: None   Functional History: Prior Function Level of Independence: Independent Comments: Reports he is independent, however, can only tolerate short ambulation distances.   Functional Status:  Mobility: Bed Mobility Overal bed mobility: Modified Independent General bed mobility comments: Increased time, however, no  assist  required.  Transfers Overall transfer level: Needs assistance Equipment used: None Transfers: Sit to/from Stand Sit to Stand: Min assist General transfer comment: patient now willing to use RW during session, attempted to stand without assist and fell back onto bed x2, min assist for stability in upright. patient very tangential with limited recpetivity into mobility concerns Ambulation/Gait Ambulation/Gait assistance: Min assist, Mod assist Ambulation Distance (Feet): 160 Feet Assistive device: None(patient staggering into objects reaching for railings/walls) Gait Pattern/deviations: Step-through pattern, Decreased stride length, Decreased dorsiflexion - right, Decreased dorsiflexion - left, Shuffle General Gait Details: patient extremely unsteady and staggering with mobility, min to moderate assist to prevent falls when patient attempting to mobilize without UE support. Noted RLE extensor tone throughout extremity with poor tone management Gait velocity: Decreased  Gait velocity interpretation: Below normal speed for age/gender    ADL: ADL Overall ADL's : Needs assistance/impaired Eating/Feeding: Independent, Sitting Grooming: Minimal assistance, Standing, Wash/dry hands, Wash/dry face, Applying deodorant Upper Body Bathing: Set up, Supervision/ safety, Sitting Lower Body Bathing: Minimal assistance, Sit to/from stand Upper Body Dressing : Set up, Supervision/safety, Sitting Lower Body Dressing: Minimal assistance, Sit to/from stand Toilet Transfer: Minimal assistance, RW Toilet Transfer Details (indicate cue type and reason): upon entering room found pt up in bathroom by himself. Upon coming out of bathroom pt was holding onto door frame then foot board of bed to help him with his balance (I checked and bed alarm was on, but not going off) Toileting- ArchitectClothing Manipulation and Hygiene: Min guard, Sit to/from stand Functional mobility during ADLs: Minimal assistance, Rolling  walker General ADL Comments: Pt can doff and don socks while seated EOB with some mild incoordination noted. He is able to stand momentarily without UE support, but then needs to hold onto something with at least one hand  Cognition: Cognition Overall Cognitive Status: Impaired/Different from baseline Orientation Level: Oriented X4 Cognition Arousal/Alertness: Awake/alert Behavior During Therapy: WFL for tasks assessed/performed Overall Cognitive Status: Impaired/Different from baseline Area of Impairment: Safety/judgement Safety/Judgement: Decreased awareness of safety General Comments: patient with decreased insight and awareness regarding condition. Patient at times minimally receptive to assist and guidance at other times appreciative for cues. Patient high fall risk  Physical Exam: Blood pressure 131/69, pulse (!) 58, temperature 98.2 F (36.8 C), temperature source Oral, resp. rate 18, height 5\' 10"  (1.778 m), weight 78.5 kg (173 lb 1 oz), SpO2 100 %. Physical Exam  Vitals reviewed. Constitutional: He appears well-developed.  Smells of tobacco  HENT:  Head: Normocephalic.  Eyes: EOM are normal. Right eye exhibits no discharge. Left eye exhibits no discharge.  Neck: Normal range of motion. Neck supple. Thyromegaly present.  Cardiovascular: Normal rate and normal heart sounds.  Respiratory: Effort normal and breath sounds normal. No respiratory distress. He has no wheezes.  GI: Soft. Bowel sounds are normal. He exhibits no distension.   Skin. Warm and dry Musculoskeletal: He exhibits no edema or tenderness.  Neurological: He is alert and oriented to person, place, and time.  Motor: LUE/LLE: 4+/5 prox to distal.  RUE: 4/5. RLE 4-/5.  Sensation intact to light touch grossly although he reports mild sensory loss on right. DTR's are 3+ R>L.  Cognitively displays reasonable insight and awareness. Is a bit impulsive. Psych: generally cooperative.    Results for orders placed or  performed during the hospital encounter of 04/11/17 (from the past 48 hour(s))  Glucose, capillary     Status: Abnormal   Collection Time: 04/16/17  9:44 AM  Result Value Ref  Range   Glucose-Capillary 179 (H) 65 - 99 mg/dL  Glucose, capillary     Status: None   Collection Time: 04/17/17  7:16 AM  Result Value Ref Range   Glucose-Capillary 87 65 - 99 mg/dL   No results found.     Medical Problem List and Plan: 1.  Decreased functional mobility secondary to MS exacerbation. 5 day course IV Solu-Medrol completed  -admit to inpatient rehab.  -pt is a fall risk 2.  DVT Prophylaxis/Anticoagulation: Lovenox. Monitor for any bleeding episodes 3. Pain Management: Neurontin 300 mg 3 times a day as needed 4. Mood: Provide emotional support 5. Neuropsych: This patient is capable of making decisions on his own behalf. 6. Skin/Wound Care: Routine skin checks 7. Fluids/Electrolytes/Nutrition: Routine I&O's with follow-up chemistries 8. History of tobacco marijuana use. Provide counseling 9. Constipation. Laxative assistance     Post Admission Physician Evaluation: 1. Functional deficits secondary  to MS exacerbation. 2. Patient is admitted to receive collaborative, interdisciplinary care between the physiatrist, rehab nursing staff, and therapy team. 3. Patient's level of medical complexity and substantial therapy needs in context of that medical necessity cannot be provided at a lesser intensity of care such as a SNF. 4. Patient has experienced substantial functional loss from his/her baseline which was documented above under the "Functional History" and "Functional Status" headings.  Judging by the patient's diagnosis, physical exam, and functional history, the patient has potential for functional progress which will result in measurable gains while on inpatient rehab.  These gains will be of substantial and practical use upon discharge  in facilitating mobility and self-care at the household  level. 5. Physiatrist will provide 24 hour management of medical needs as well as oversight of the therapy plan/treatment and provide guidance as appropriate regarding the interaction of the two. 6. The Preadmission Screening has been reviewed and patient status is unchanged unless otherwise stated above. 7. 24 hour rehab nursing will assist with bladder management, bowel management, safety, skin/wound care, disease management, medication administration, pain management and patient education  and help integrate therapy concepts, techniques,education, etc. 8. PT will assess and treat for/with: Lower extremity strength, range of motion, stamina, balance, functional mobility, safety, adaptive techniques and equipment, NMR, fall risk assessment, family ed.   Goals are: mod I. 9. OT will assess and treat for/with: ADL's, functional mobility, safety, upper extremity strength, adaptive techniques and equipment, NMR, fall risk, ego support, community reentry.   Goals are: mod I. Therapy may proceed with showering this patient. 10. SLP will assess and treat for/with: n/a.  Goals are: n/a. 11. Case Management and Social Worker will assess and treat for psychological issues and discharge planning. 12. Team conference will be held weekly to assess progress toward goals and to determine barriers to discharge. 13. Patient will receive at least 3 hours of therapy per day at least 5 days per week. 14. ELOS: 6-10 days       15. Prognosis:  excellent     Ranelle Oyster, MD, [facility] Physical Medicine & Rehabilitation 04/17/2017  Mcarthur Rossetti Angiulli, PA-C 04/17/2017

## 2017-04-17 NOTE — Progress Notes (Signed)
   Subjective:  Patient denies worsening symptoms of weakness. Continues to endorse balance issues and difficulty walking due to RLE weakness.  Objective:  Vital signs in last 24 hours: Vitals:   04/16/17 1600 04/16/17 2035 04/17/17 0457 04/17/17 0742  BP: 132/68 123/65 134/76 131/69  Pulse: 65 (!) 55 (!) 54 (!) 58  Resp: 18 18 20 18   Temp: 98.4 F (36.9 C) 98.4 F (36.9 C) 98.4 F (36.9 C) 98.2 F (36.8 C)  TempSrc: Oral Oral Oral Oral  SpO2: 98% 99% 100% 100%  Weight:  173 lb 1 oz (78.5 kg)    Height:       Constitutional: NAD Resp: No increased work of breathing MSK: moves all 4 extremities freely  Assessment/Plan:  Principal Problem:   Multiple sclerosis exacerbation (HCC) Active Problems:   Multiple sclerosis (HCC)   Marijuana abuse, continuous   Vitamin D deficiency   Marijuana abuse   Tobacco abuse   Benign essential HTN   Steroid-induced hyperglycemia   Leucocytosis  Multiple sclerosis flare w/right sided weakness: MRI of the brain and cervical spine shows significant progression of the white matter disease in the colossoseptal margin and increase in demyelination since 2014. There is also progressive white matter disease in the spinal cord at the C2 level. T spine MRI showed chronic demyelination of T2 and T9-T10, no new enhancing lesions, however there are no enhancing lesions. --s/p IV solumedrol 1 g daily for 5 days   --vit D supplementation --will need his follow up appointment rescheduled with his neurologist to continue or adjust maintenance therapy if necessary --CIR to accept patient today.   Pre-DM --will monitor morning CBGs    Tobacco use disorder: Discussed again recommendation for long term cessation as it can increase risk of MS flares. --nicotine patch --continue encouraging cessation   Dispo: Discharge to CIR today.   Nyra Market, MD 04/17/2017, 1:53 PM Pager # [phone]

## 2017-04-17 NOTE — H&P (Deleted)
  The note originally documented on this encounter has been moved the the encounter in which it belongs.  

## 2017-04-17 NOTE — H&P (Signed)
Physical Medicine and Rehabilitation Admission H&P    No chief complaint on file. : HPI: [name] is a 36 y.o. right handed male with history of hypertension, multiple sclerosis diagnosed 2010 followed by Dr. Terrace Arabia with history of recent exacerbations. Patient on no prescription medications prior to admission. Per chart review and patient, patient lives cousins. Reported to be independent ambulating only short distances and still driving. One level home with ramped entrance. Family in the area checks on him as needed. Presented 04/11/2017 with increasing weakness over the past 5 days as well as blurring of vision. Urine drug screen positive for marijuana. MRI the brain, reviewed showing progressive disease.  Per report, MRI brain,  thoracic and cervical spine showed significant progression focal white matter disease involving the colossoseptal margin compatible with multiple sclerosis and significant increase in demyelination since 2014. Progressive periventricular and brainstem disease. Progression with white matter disease within the spinal cord particularly at the C2 level. Neurology consulted for exacerbation of multiple sclerosis placed on intravenous Solu-Medrol 5 days completed 04/15/2017. Subcutaneous Lovenox for DVT prophylaxis. Physical and occupational therapy evaluations completed with recommendations of physical medicine rehabilitation consult. Patient was admitted for a comprehensive rehabilitation program    ROS Constitutional: Negative for chills and fever.  HENT: Negative for hearing loss.   Eyes: Positive for blurred vision.  Respiratory: Negative for cough and shortness of breath.   Cardiovascular: Negative for chest pain, palpitations and leg swelling.  Gastrointestinal: Positive for constipation. Negative for nausea.  Genitourinary: Positive for urgency. Negative for dysuria and hematuria.  Musculoskeletal: Positive for joint and back pain and myalgias. Denied falls    Skin: Negative for rash.  Neurological: Positive for dizziness, speech change, focal weakness and weakness. Negative for seizures. Negative loss of consciousness Psychiatric/Behavioral: Positive for depression.  All other systems reviewed and are negative   Past Medical History:  Diagnosis Date  . Depression   . Diabetes mellitus without complication (HCC)   . Eye abnormalities    unspecified by pt  . Hypertension   . Multiple sclerosis (HCC)   . Weakness    generalized   Past Surgical History:  Procedure Laterality Date  . WISDOM TOOTH EXTRACTION     Family History  Adopted: Yes  Problem Relation Age of Onset  . Multiple sclerosis Cousin   . Heart disease Mother   . Other Father        Does not know his father.   Social History:  reports that he has been smoking cigarettes.  He has been smoking about 1.00 pack per day. he has never used smokeless tobacco. He reports that he drinks about 7.2 oz of alcohol per week. He reports that he uses drugs. Drug: Marijuana. Frequency: 30.00 times per week. Allergies: No Known Allergies Medications Prior to Admission  Medication Sig Dispense Refill  . [START ON 04/18/2017] cholecalciferol 2000 units TABS Take 1 tablet (2,000 Units total) by mouth daily.    Melene Muller ON 04/18/2017] nicotine (NICODERM CQ - DOSED IN MG/24 HOURS) 21 mg/24hr patch Place 1 patch (21 mg total) onto the skin daily. 28 patch 0    Drug Regimen Review Drug regimen was reviewed and remains appropriate with no significant issues identified  Home:     Functional History:    Functional Status:  Mobility:          ADL:    Cognition:      Physical Exam: There were no vitals taken for this visit. Physical  Exam  Vitals reviewed. Constitutional: He appears well-developed.  Smells of tobacco  HENT:  Head: Normocephalic.  Eyes: EOM are normal. Right eye exhibits no discharge. Left eye exhibits no discharge.  Neck: Normal range of motion. Neck  supple. Thyromegaly present.  Cardiovascular: Normal rate and normal heart sounds.  Respiratory: Effort normal and breath sounds normal. No respiratory distress. He has no wheezes.  GI: Soft. Bowel sounds are normal. He exhibits no distension.   Skin. Warm and dry Musculoskeletal: He exhibits no edema or tenderness.  Neurological: He is alert and oriented to person, place, and time.  Motor: LUE/LLE: 4+/5 prox to distal.  RUE: 4/5. RLE 4-/5.  Sensation intact to light touch grossly although he reports mild sensory loss on right. DTR's are 3+ R>L.  Cognitively displays reasonable insight and awareness. Is a bit impulsive. Psych: generally cooperative.    Results for orders placed or performed during the hospital encounter of 04/11/17 (from the past 48 hour(s))  Glucose, capillary     Status: Abnormal   Collection Time: 04/16/17  9:44 AM  Result Value Ref Range   Glucose-Capillary 179 (H) 65 - 99 mg/dL  Glucose, capillary     Status: None   Collection Time: 04/17/17  7:16 AM  Result Value Ref Range   Glucose-Capillary 87 65 - 99 mg/dL   No results found.     Medical Problem List and Plan: 1.  Decreased functional mobility secondary to MS exacerbation. 5 day course IV Solu-Medrol completed  -admit to inpatient rehab.  -pt is a fall risk 2.  DVT Prophylaxis/Anticoagulation: Lovenox. Monitor for any bleeding episodes 3. Pain Management: Neurontin 300 mg 3 times a day as needed 4. Mood: Provide emotional support 5. Neuropsych: This patient is capable of making decisions on his own behalf. 6. Skin/Wound Care: Routine skin checks 7. Fluids/Electrolytes/Nutrition: Routine I&O's with follow-up chemistries 8. History of tobacco marijuana use. Provide counseling 9. Constipation. Laxative assistance     Post Admission Physician Evaluation: 1. Functional deficits secondary  to MS exacerbation. 2. Patient is admitted to receive collaborative, interdisciplinary care between the  physiatrist, rehab nursing staff, and therapy team. 3. Patient's level of medical complexity and substantial therapy needs in context of that medical necessity cannot be provided at a lesser intensity of care such as a SNF. 4. Patient has experienced substantial functional loss from his/her baseline which was documented above under the "Functional History" and "Functional Status" headings.  Judging by the patient's diagnosis, physical exam, and functional history, the patient has potential for functional progress which will result in measurable gains while on inpatient rehab.  These gains will be of substantial and practical use upon discharge  in facilitating mobility and self-care at the household level. 5. Physiatrist will provide 24 hour management of medical needs as well as oversight of the therapy plan/treatment and provide guidance as appropriate regarding the interaction of the two. 6. The Preadmission Screening has been reviewed and patient status is unchanged unless otherwise stated above. 7. 24 hour rehab nursing will assist with bladder management, bowel management, safety, skin/wound care, disease management, medication administration, pain management and patient education  and help integrate therapy concepts, techniques,education, etc. 8. PT will assess and treat for/with: Lower extremity strength, range of motion, stamina, balance, functional mobility, safety, adaptive techniques and equipment, NMR, fall risk assessment, family ed.   Goals are: mod I. 9. OT will assess and treat for/with: ADL's, functional mobility, safety, upper extremity strength, adaptive techniques and equipment, NMR, fall  risk, ego support, community reentry.   Goals are: mod I. Therapy may proceed with showering this patient. 10. SLP will assess and treat for/with: n/a.  Goals are: n/a. 11. Case Management and Social Worker will assess and treat for psychological issues and discharge planning. 12. Team conference will  be held weekly to assess progress toward goals and to determine barriers to discharge. 13. Patient will receive at least 3 hours of therapy per day at least 5 days per week. 14. ELOS: 6-10 days       15. Prognosis:  excellent     Ranelle OysterZachary T. Zeba Luby, MD, [facility] Physical Medicine & Rehabilitation 04/17/2017  Ranelle OysterSWARTZ,Milinda Sweeney T, MD 04/17/2017   Charlton Amoraniel J Angiulli, PA-C

## 2017-04-17 NOTE — Progress Notes (Signed)
  Date: 04/17/2017  Patient name: [name]  Medical record number: [id number]  Date of birth: Mar 23, 1981   I have seen and evaluated this patient and I have discussed the plan of care with the house staff. Please see their note for complete details.  Anne Shutter, MD 04/17/2017, 3:10 PM

## 2017-04-17 NOTE — Progress Notes (Signed)
Patient admitted to unit about 1630. Patient given rehab notebook and went over safety plan/fall prevention with patient. Patient denied any questions. Call bell and belongings left within patient reach.

## 2017-04-17 NOTE — Progress Notes (Signed)
Rehab admissions - I met with patient and he would like to admit to acute inpatient rehab.  I have approval from insurance carrier for inpatient rehab admission.  Patient has been cleared by attending MD for rehab.  Bed available on rehab and will admit to inpatient rehab today.  Call me for questions.  #[phone]

## 2017-04-17 NOTE — PMR Pre-admission (Signed)
PMR Admission Coordinator Pre-Admission Assessment  Patient: [name] is an 36 y.o., male MRN: [id number] DOB: Oct 04, 1980 Height: _0  (177.8 cm) Weight: 78.5 kg (173 lb 1 oz)              Insurance Information HMO: Yes   PPO:       PCP:       IPA:       80/20:       OTHER:  Group [id number] PRIMARY: [employer]      Policy#: [id number]      Subscriber: Lyanne [employer] CM Name: Wyona Almas      Phone#: [phone] X [phone]     Fax#: [phone] Pre-Cert#: [id number]      Employer: Disabled Benefits:  Phone #: [phone]     Name:  On line Eff. Date: 05/01/16     Deduct:  $0      Out of Pocket Max: $5900 (met $649.79)      Life Max: N/A CIR: $295 days 1-6      SNF: $0 days 1-20; $167 days 21-100 Outpatient: medical necessity     Co-Pay: $40/visit Home Health: 100%      Co-Pay: none DME: 80%     Co-Pay: 20% Providers: in network  Emergency Contact Information Contact Information    Name Relation Home Work Mobile   [place] Spouse [phone]  [phone]   Braylen, Staller Daughter   [phone]     Current Medical History  Patient Admitting Diagnosis: MS exacerbation  History of Present Illness: A 36 y.o.right handed malewith history of hypertension, multiple sclerosis diagnosed 203fllowed by Dr. YOliver Humhistory of recent exacerbations. Patient on no prescription medications prior to admission. Per chart review and patient,patient liveswith cousins. Reported to be independent ambulating only short distancesand still driving. One level home with ramped entrance.Family in the area checks on him as needed.Presented 04/11/2017 with increasing weakness over the past 5 days as well as blurring of vision. Urine drug screen positive for marijuana. MRI the brain, reviewed showing progressive disease. Per report, MRI brain, thoracic and cervical spine showed significant progression focal white matter disease involving the colossoseptalmargin compatible with multiple  sclerosis and significant increase in demyelination since 2014. Progressive periventricular and brainstem disease. Progression with white matter disease within the spinal cord particularly at the C2 level. Neurology consulted for exacerbation of multiple sclerosis placed on intravenous Solu-Medrol 5 days completed 04/15/2017. Subcutaneous Lovenox for DVT prophylaxis. Physical and occupational therapy evaluations completed with recommendations of physical medicine rehabilitation consult. Patient to be admitted for a comprehensive inpatient rehabilitation program.  Past Medical History  Past Medical History:  Diagnosis Date  . Depression   . Diabetes mellitus without complication ([place]   . Eye abnormalities    unspecified by pt  . Hypertension   . Multiple sclerosis ([place]   . Weakness    generalized    Family History  family history includes Heart disease in his mother; Multiple sclerosis in his cousin; Other in his father. He was adopted.  Prior Rehab/Hospitalizations: No previous rehab admissions  Has the patient had major surgery during 100 days prior to admission? No  Current Medications   Current Facility-Administered Medications:  .  cholecalciferol (VITAMIN D) tablet 2,000 Units, 2,000 Units, Oral, Daily, SAlphonzo Grieve MD, 2,000 Units at 04/17/17 1028 .  enoxaparin (LOVENOX) injection 40 mg, 40 mg, Subcutaneous, Q24H, SBurgess Estelle MD, 40 mg at 04/17/17 1028 .  gabapentin (NEURONTIN) tablet 300 mg, 300 mg, Oral, TID PRN, KLeonel Ramsay  Vida Roller, MD .  nicotine (NICODERM CQ - dosed in mg/24 hours) patch 21 mg, 21 mg, Transdermal, Daily, Colbert Ewing, MD, 21 mg at 04/17/17 1028 .  pantoprazole (PROTONIX) EC tablet 40 mg, 40 mg, Oral, Daily, Norva Riffle, RPH, 40 mg at 04/17/17 1028  Patients Current Diet: Diet Carb Modified Fluid consistency: Thin; Room service appropriate? Yes  Precautions / Restrictions Precautions Precautions: Fall Precaution Comments: Pt  reports he falls 6 or 7 times a day secondary to foot drag.  Restrictions Weight Bearing Restrictions: No   Has the patient had 2 or more falls or a fall with injury in the past year?Yes.  Patient reports 3-7 falls daily.  Prior Activity Level Community (5-7x/wk): Went out daily, was driving, is disabled.  Home Assistive Devices / Equipment Home Assistive Devices/Equipment: Eyeglasses Home Equipment: None  Prior Device Use: Indicate devices/aids used by the patient prior to current illness, exacerbation or injury? None  Prior Functional Level Prior Function Level of Independence: Independent Comments: Reports he is independent, however, can only tolerate short ambulation distances.   Self Care: Did the patient need help bathing, dressing, using the toilet or eating?  Independent  Indoor Mobility: Did the patient need assistance with walking from room to room (with or without device)? Independent  Stairs: Did the patient need assistance with internal or external stairs (with or without device)? Needed some help.  Has difficulty coming down stairs.  Functional Cognition: Did the patient need help planning regular tasks such as shopping or remembering to take medications? Independent  Current Functional Level Cognition  Overall Cognitive Status: Impaired/Different from baseline Orientation Level: Oriented X4 Safety/Judgement: Decreased awareness of safety General Comments: patient with decreased insight and awareness regarding condition. Patient at times minimally receptive to assist and guidance at other times appreciative for cues. Patient high fall risk    Extremity Assessment (includes Sensation/Coordination)  Upper Extremity Assessment: RUE deficits/detail, LUE deficits/detail RUE Deficits / Details: 4/5 MMT LUE Deficits / Details: MMT WNL  Lower Extremity Assessment: Generalized weakness, RLE deficits/detail, LLE deficits/detail RLE Deficits / Details: Able to perform  ankle pumps, however, drags RLE during gait. Suspect increased tone with gait.  LLE Deficits / Details: Able to perform ankle pumps, however, drags RLE during gait. Suspect increased tone with gait.     ADLs  Overall ADL's : Needs assistance/impaired Eating/Feeding: Independent, Sitting Grooming: Minimal assistance, Standing, Wash/dry hands, Wash/dry face, Applying deodorant Upper Body Bathing: Set up, Supervision/ safety, Sitting Lower Body Bathing: Minimal assistance, Sit to/from stand Upper Body Dressing : Set up, Supervision/safety, Sitting Lower Body Dressing: Minimal assistance, Sit to/from stand Toilet Transfer: Minimal assistance, RW Toilet Transfer Details (indicate cue type and reason): upon entering room found pt up in bathroom by himself. Upon coming out of bathroom pt was holding onto door frame then foot board of bed to help him with his balance (I checked and bed alarm was on, but not going off) Toileting- Water quality scientist and Hygiene: Min guard, Sit to/from stand Functional mobility during ADLs: Minimal assistance, Rolling walker General ADL Comments: Pt can doff and don socks while seated EOB with some mild incoordination noted. He is able to stand momentarily without UE support, but then needs to hold onto something with at least one hand    Mobility  Overal bed mobility: Modified Independent General bed mobility comments: Increased time, however, no assist required.     Transfers  Overall transfer level: Needs assistance Equipment used: None Transfers: Sit to/from Stand Sit  to Stand: Min assist General transfer comment: patient now willing to use RW during session, attempted to stand without assist and fell back onto bed x2, min assist for stability in upright. patient very tangential with limited recpetivity into mobility concerns    Ambulation / Gait / Stairs / Emergency planning/management officer  Ambulation/Gait Ambulation/Gait assistance: Min assist, Mod assist Ambulation  Distance (Feet): 160 Feet Assistive device: None(patient staggering into objects reaching for railings/walls) Gait Pattern/deviations: Step-through pattern, Decreased stride length, Decreased dorsiflexion - right, Decreased dorsiflexion - left, Shuffle General Gait Details: patient extremely unsteady and staggering with mobility, min to moderate assist to prevent falls when patient attempting to mobilize without UE support. Noted RLE extensor tone throughout extremity with poor tone management Gait velocity: Decreased  Gait velocity interpretation: Below normal speed for age/gender    Posture / Balance Balance Overall balance assessment: Needs assistance Sitting-balance support: No upper extremity supported, Feet supported Sitting balance-Leahy Scale: Good Standing balance support: During functional activity, No upper extremity supported Standing balance-Leahy Scale: Poor Standing balance comment: can only stand statically without UE support for very short periods of time, dynamically very poor without UE supports    Special needs/care consideration BiPAP/CPAP No CPM No Continuous Drip IV No Dialysis No      Life Vest No Oxygen No Special Bed No Trach Size No Wound Vac (area) No     Skin No                           Bowel mgmt: Last BM 04/16/17 Bladder mgmt: Voiding in bathroom with assistacne Diabetic mgmt No    Previous Home Environment Living Arrangements: Alone, Non-relatives/Friends Available Help at Discharge: Family, Available 24 hours/day Type of Home: House Home Layout: One level Home Access: Ramped entrance Bathroom Shower/Tub: Chiropodist: Standard Home Care Services: No  Discharge Living Setting Plans for Discharge Living Setting: House(Wife and he have a house, but may not go there) Type of Home at Discharge: House Discharge Home Layout: One level Discharge Home Access: Stairs to enter Entrance Stairs-Number of Steps: 2 Does the patient  have any problems obtaining your medications?: No  Social/Family/Support Systems Patient Roles: Spouse, Parent(Has a wife, dtr 37 and son 59 yo) Contact Information: Karin Golden - wife - [phone] Anticipated Caregiver: Wife and dtr Ability/Limitations of Caregiver: Wife works, dtr in school Caregiver Availability: Intermittent Discharge Plan Discussed with Primary Caregiver: No(Discussed with patient.) Is Caregiver In Agreement with Plan?: No(May choose to not go home with wife.) Does Caregiver/Family have Issues with Lodging/Transportation while Pt is in Rehab?: No  Goals/Additional Needs Patient/Family Goal for Rehab: PT/OT mod I goals Expected length of stay: 5-9 days Cultural Considerations: None Dietary Needs: Carb mod, med cal, thin liquids Equipment Needs: TBD Additional Information: Patient says he has many places he can stay after rehab.  He may not go to his house or to his wife. Pt/Family Agrees to Admission and willing to participate: Yes Program Orientation Provided & Reviewed with Pt/Caregiver Including Roles  & Responsibilities: Yes  Decrease burden of Care through IP rehab admission: N/A  Possible need for SNF placement upon discharge: Not planned  Patient Condition: This patient's condition remains as documented in the consult dated 04/16/17, in which the Rehabilitation Physician determined and documented that the patient's condition is appropriate for intensive rehabilitative care in an inpatient rehabilitation facility pending: re-evaluations done by PT/OT and have insurance approval for inpatient rehab admission; patient is willing  to participate and wants to come to inpatient rehab. These areas have been addressed.  Will admit to inpatient rehab today.  Preadmission Screen Completed By:  Retta Diones, 04/17/2017 1:03 PM ______________________________________________________________________   Discussed status with Dr. Naaman Plummer on 04/17/17 at 1301 and  received telephone approval for admission today.  Admission Coordinator:  Retta Diones, time 1303/Date 04/17/17

## 2017-04-17 NOTE — Progress Notes (Signed)
Pt continuously gets up without calling for assistance, to go to the rest room. Offered pt a urinal but he refused. Will continue to monitor.

## 2017-04-18 ENCOUNTER — Inpatient Hospital Stay (HOSPITAL_COMMUNITY): Payer: Medicare Other

## 2017-04-18 ENCOUNTER — Inpatient Hospital Stay (HOSPITAL_COMMUNITY): Payer: Medicare HMO | Admitting: Occupational Therapy

## 2017-04-18 LAB — CBC WITH DIFFERENTIAL/PLATELET
BASOS PCT: 0 %
Basophils Absolute: 0 10*3/uL (ref 0.0–0.1)
EOS ABS: 0.1 10*3/uL (ref 0.0–0.7)
EOS PCT: 3 %
HCT: 41.2 % (ref 39.0–52.0)
HEMOGLOBIN: 13.7 g/dL (ref 13.0–17.0)
LYMPHS ABS: 0.5 10*3/uL — AB (ref 0.7–4.0)
Lymphocytes Relative: 13 %
MCH: 28.7 pg (ref 26.0–34.0)
MCHC: 33.3 g/dL (ref 30.0–36.0)
MCV: 86.4 fL (ref 78.0–100.0)
MONO ABS: 0.6 10*3/uL (ref 0.1–1.0)
MONOS PCT: 18 %
Neutro Abs: 2.3 10*3/uL (ref 1.7–7.7)
Neutrophils Relative %: 66 %
PLATELETS: 209 10*3/uL (ref 150–400)
RBC: 4.77 MIL/uL (ref 4.22–5.81)
RDW: 13.1 % (ref 11.5–15.5)
WBC: 3.5 10*3/uL — ABNORMAL LOW (ref 4.0–10.5)

## 2017-04-18 LAB — COMPREHENSIVE METABOLIC PANEL
ALK PHOS: 48 U/L (ref 38–126)
ALT: 19 U/L (ref 17–63)
ANION GAP: 9 (ref 5–15)
AST: 16 U/L (ref 15–41)
Albumin: 3.1 g/dL — ABNORMAL LOW (ref 3.5–5.0)
BUN: 12 mg/dL (ref 6–20)
CALCIUM: 8.8 mg/dL — AB (ref 8.9–10.3)
CO2: 28 mmol/L (ref 22–32)
Chloride: 100 mmol/L — ABNORMAL LOW (ref 101–111)
Creatinine, Ser: 0.81 mg/dL (ref 0.61–1.24)
GFR calc non Af Amer: >60 mL/min (ref >60)
Glucose, Bld: 101 mg/dL — ABNORMAL HIGH (ref 65–99)
POTASSIUM: 4.1 mmol/L (ref 3.5–5.1)
SODIUM: 137 mmol/L (ref 135–145)
Total Bilirubin: 0.6 mg/dL (ref 0.3–1.2)
Total Protein: 5.8 g/dL — ABNORMAL LOW (ref 6.5–8.1)

## 2017-04-18 NOTE — Consult Note (Signed)
Physical Medicine and Rehabilitation Consult Reason for Consult: Decreased functional mobility Referring Physician: [facility]   HPI: [name] is a 36 y.o. right handed male with history of hypertension, multiple sclerosis diagnosed 2010 followed by Dr. Terrace Arabia with history of recent exacerbations. Per chart review and patient, patient lives cousins. Reported to be independent ambulating only short distances and still driving. One level home with ramped entrance. Family in the area checks on him as needed. Presented 04/11/2017 with increasing weakness over the past 5 days as well as blurring of vision. Urine drug screen positive for marijuana. MRI the brain, reviewed showing progressive disease.  Per report, MRI brain,  thoracic and cervical spine showed significant progression focal white matter disease involving the colossoseptal margin compatible with multiple sclerosis and significant increase in demyelination since 2014. Progressive periventricular and brainstem disease. Progression with white matter disease within the spinal cord particularly at the C2 level. Neurology consulted for exacerbation of multiple sclerosis placed on intravenous Solu-Medrol 5 days completed 04/15/2017. Subcutaneous Lovenox for DVT prophylaxis. Physical and occupational therapy evaluations completed with recommendations of physical medicine rehabilitation consult.   Review of Systems  Constitutional: Negative for chills and fever.  HENT: Negative for hearing loss.   Eyes: Positive for blurred vision.  Respiratory: Negative for cough and shortness of breath.   Cardiovascular: Negative for chest pain, palpitations and leg swelling.  Gastrointestinal: Positive for constipation. Negative for nausea.  Genitourinary: Positive for urgency. Negative for dysuria and hematuria.  Musculoskeletal: Positive for joint pain and myalgias.  Skin: Negative for rash.  Neurological: Positive for dizziness, speech change, focal  weakness and weakness. Negative for seizures.  Psychiatric/Behavioral: Positive for depression.  All other systems reviewed and are negative.  Past Medical History:  Diagnosis Date  . Depression   . Diabetes mellitus without complication ([facility])   . Eye abnormalities    unspecified by pt  . Hypertension   . Multiple sclerosis ([facility])   . Weakness    generalized   Past Surgical History:  Procedure Laterality Date  . WISDOM TOOTH EXTRACTION     Family History  Adopted: Yes  Problem Relation Age of Onset  . Multiple sclerosis Cousin   . Heart disease Mother   . Other Father        Does not know his father.   Social History:  reports that he has been smoking cigarettes.  He has been smoking about 1.00 pack per day. he has never used smokeless tobacco. He reports that he drinks about 7.2 oz of alcohol per week. He reports that he uses drugs. Drug: Marijuana. Frequency: 30.00 times per week. Allergies: No Known Allergies Medications Prior to Admission  Medication Sig Dispense Refill  . cholecalciferol 2000 units TABS Take 1 tablet (2,000 Units total) by mouth daily.    . nicotine (NICODERM CQ - DOSED IN MG/24 HOURS) 21 mg/24hr patch Place 1 patch (21 mg total) onto the skin daily. 28 patch 0    Home: Home Living Family/patient expects to be discharged to:: Private residence Living Arrangements: Alone, Non-relatives/Friends  Functional History:   Functional Status:  Mobility:          ADL:    Cognition: Cognition Orientation Level: Oriented X4    Blood pressure 127/76, pulse 60, temperature 98.1 F (36.7 C), temperature source Oral, resp. rate 18, height 5\' 10"  (1.778 m), weight 80.5 kg (177 lb 6.4 oz), SpO2 100 %. Physical Exam  Vitals reviewed. Constitutional: He is oriented  to person, place, and time. He appears well-developed and well-nourished.  HENT:  Head: Normocephalic and atraumatic.  Eyes: EOM are normal. Right eye exhibits no discharge. Left eye exhibits  no discharge.  Neck: Normal range of motion. Neck supple. No thyromegaly present.  Cardiovascular: Normal rate, regular rhythm and normal heart sounds.  Respiratory: Effort normal and breath sounds normal. No respiratory distress.  GI: Soft. Bowel sounds are normal. He exhibits no distension.  Musculoskeletal: He exhibits no edema or tenderness.  Neurological: He is alert and oriented to person, place, and time.  Motor: LUE/LLE: 4+/5 proximal to distal RLE/RLE: 4-/5 proximal to distal Sensation intact to light touch  Skin: Skin is warm and dry.  Psychiatric: He has a normal mood and affect. His behavior is normal.    Results for orders placed or performed during the hospital encounter of 04/17/17 (from the past 24 hour(s))  CBC     Status: Abnormal   Collection Time: 04/17/17  5:10 PM  Result Value Ref Range   WBC 3.6 (L) 4.0 - 10.5 K/uL   RBC 4.73 4.22 - 5.81 MIL/uL   Hemoglobin 14.0 13.0 - 17.0 g/dL   HCT 09.841.3 11.939.0 - 14.752.0 %   MCV 87.3 78.0 - 100.0 fL   MCH 29.6 26.0 - 34.0 pg   MCHC 33.9 30.0 - 36.0 g/dL   RDW 82.913.5 56.211.5 - 13.015.5 %   Platelets 208 150 - 400 K/uL  Creatinine, serum     Status: None   Collection Time: 04/17/17  5:10 PM  Result Value Ref Range   Creatinine, Ser 0.98 0.61 - 1.24 mg/dL   GFR calc non Af Amer >60 >60 mL/min   GFR calc Af Amer >60 >60 mL/min  CBC WITH DIFFERENTIAL     Status: Abnormal   Collection Time: 04/18/17  5:54 AM  Result Value Ref Range   WBC 3.5 (L) 4.0 - 10.5 K/uL   RBC 4.77 4.22 - 5.81 MIL/uL   Hemoglobin 13.7 13.0 - 17.0 g/dL   HCT 86.541.2 78.439.0 - 69.652.0 %   MCV 86.4 78.0 - 100.0 fL   MCH 28.7 26.0 - 34.0 pg   MCHC 33.3 30.0 - 36.0 g/dL   RDW 29.513.1 28.411.5 - 13.215.5 %   Platelets 209 150 - 400 K/uL   Neutrophils Relative % 66 %   Neutro Abs 2.3 1.7 - 7.7 K/uL   Lymphocytes Relative 13 %   Lymphs Abs 0.5 (L) 0.7 - 4.0 K/uL   Monocytes Relative 18 %   Monocytes Absolute 0.6 0.1 - 1.0 K/uL   Eosinophils Relative 3 %   Eosinophils Absolute 0.1  0.0 - 0.7 K/uL   Basophils Relative 0 %   Basophils Absolute 0.0 0.0 - 0.1 K/uL  Comprehensive metabolic panel     Status: Abnormal   Collection Time: 04/18/17  5:54 AM  Result Value Ref Range   Sodium 137 135 - 145 mmol/L   Potassium 4.1 3.5 - 5.1 mmol/L   Chloride 100 (L) 101 - 111 mmol/L   CO2 28 22 - 32 mmol/L   Glucose, Bld 101 (H) 65 - 99 mg/dL   BUN 12 6 - 20 mg/dL   Creatinine, Ser 4.400.81 0.61 - 1.24 mg/dL   Calcium 8.8 (L) 8.9 - 10.3 mg/dL   Total Protein 5.8 (L) 6.5 - 8.1 g/dL   Albumin 3.1 (L) 3.5 - 5.0 g/dL   AST 16 15 - 41 U/L   ALT 19 17 - 63 U/L  Alkaline Phosphatase 48 38 - 126 U/L   Total Bilirubin 0.6 0.3 - 1.2 mg/dL   GFR calc non Af Amer >60 >60 mL/min   GFR calc Af Amer >60 >60 mL/min   Anion gap 9 5 - 15   No results found.  Assessment/Plan: Diagnosis: MS exacerbation Labs and images independently reviewed.  Records reviewed and summated above.  1. Does the need for close, 24 hr/day medical supervision in concert with the patient's rehab needs make it unreasonable for this patient to be served in a less intensive setting? Potentially  2. Co-Morbidities requiring supervision/potential complications: marijuana and tobacco abuse (counsel), HTN (monitor and provide prns in accordance with increased physical exertion and pain), multiple sclerosis (consider restarting maintanance therapy), steroid induced hyperglycemia (Monitor in accordance with exercise and adjust meds as necessary), leukocytosis (cont to monitor for signs and symptoms of infection, further workup if indicated) 3. Due to safety, disease management and patient education, does the patient require 24 hr/day rehab nursing? Potentially 4. Does the patient require coordinated care of a physician, rehab nurse, PT (1-2 hrs/day, 5 days/week) and OT (1-2 hrs/day, 5 days/week) to address physical and functional deficits in the context of the above medical diagnosis(es)? Potentially Addressing deficits in the  following areas: balance, endurance, locomotion, strength, bathing, dressing, toileting and psychosocial support 5. Can the patient actively participate in an intensive therapy program of at least 3 hrs of therapy per day at least 5 days per week? Yes 6. The potential for patient to make measurable gains while on inpatient rehab is excellent 7. Anticipated functional outcomes upon discharge from inpatient rehab are modified independent  with PT, modified independent with OT, n/a with SLP. 8. Estimated rehab length of stay to reach the above functional goals is: 5-9 days. 9. Anticipated D/C setting: Home 10. Anticipated post D/C treatments: HH therapy and Home excercise program 11. Overall Rehab/Functional Prognosis: good  RECOMMENDATIONS: This patient's condition is appropriate for continued rehabilitative care in the following setting: Patient appears to making functional progress and states he can "pretty much do whatever I need to do".  Recommend reeval by therapies and if deficits persist recommend CIR, however, pt needs to be willing to participate with CIR. Patient has agreed to participate in recommended program. Potentially Note that insurance prior authorization may be required for reimbursement for recommended care.  Comment: Rehab Admissions Coordinator to follow up.  Maryla Morrow, MD, ABPMR Trish Mage, RN 04/18/2017

## 2017-04-18 NOTE — Progress Notes (Signed)
[facility] PHYSICAL MEDICINE & REHABILITATION     PROGRESS NOTE    Subjective/Complaints: Patient alert sitting in bed without complaints.  States that when he needs to go to the bathroom he has to go "then".  Nurse stresses concerns over impulsivity and safety awareness  ROS: pt denies nausea, vomiting, diarrhea, cough, shortness of breath or chest pain   Objective: Vital Signs: Blood pressure 127/76, pulse 60, temperature 98.1 F (36.7 C), temperature source Oral, resp. rate 18, height 5\' 10"  (1.778 m), weight 80.5 kg (177 lb 6.4 oz), SpO2 100 %. No results found. Recent Labs    04/17/17 1710 04/18/17 0554  WBC 3.6* 3.5*  HGB 14.0 13.7  HCT 41.3 41.2  PLT 208 209   Recent Labs    04/17/17 1710 04/18/17 0554  NA  --  137  K  --  4.1  CL  --  100*  GLUCOSE  --  101*  BUN  --  12  CREATININE 0.98 0.81  CALCIUM  --  8.8*   CBG (last 3)  Recent Labs    04/16/17 0944 04/17/17 0716  GLUCAP 179* 87    Wt Readings from Last 3 Encounters:  04/17/17 80.5 kg (177 lb 6.4 oz)  04/16/17 78.5 kg (173 lb 1 oz)  07/26/16 79.6 kg (175 lb 8 oz)    Physical Exam:  Constitutional: He appears well-developed.  Smells of tobacco  HENT:  Head: Normocephalic.  Eyes: EOM are normal. Right eye exhibits no discharge. Left eye exhibits no discharge.  Neck: Normal range of motion. Neck supple. Thyromegaly present.  Cardiovascular: RRR without murmur. No JVD   Respiratory: CTA Bilaterally without wheezes or rales. Normal effort GI: Soft. Bowel sounds are normal. He exhibits no distension.   Skin. Warm and dry Musculoskeletal: He exhibits noedemaor tenderness.  Neurological: He isalertand oriented to person, place, and time. Motor: LUE/LLE: 4+/5 prox to distal.  RUE: 4/5. RLE 4-/5.  Sensation intact to light touchgrossly although he reports mild sensory loss on right. DTR's are 3+ R>L. --- Motor and sensory exam generally intact Cognitively displays reasonable insight and  awareness.  Remains impulsive. Psych: Pleasant and cooperative    Assessment/Plan: 1.  Functional deficits secondary to multiple sclerosis exacerbation which require 3+ hours per day of interdisciplinary therapy in a comprehensive inpatient rehab setting. Physiatrist is providing close team supervision and 24 hour management of active medical problems listed below. Physiatrist and rehab team continue to assess barriers to discharge/monitor patient progress toward functional and medical goals.  Function:  Bathing Bathing position      Bathing parts      Bathing assist        Upper Body Dressing/Undressing Upper body dressing                    Upper body assist        Lower Body Dressing/Undressing Lower body dressing                                  Lower body assist        Toileting Toileting          Toileting assist     Transfers Chair/bed transfer     Chair/bed transfer assist level: Supervision or verbal cues Chair/bed transfer assistive device: Scientist, physiologicalBedrails     Locomotion Ambulation           Wheelchair  Cognition Comprehension Comprehension assist level: Follows basic conversation/direction with no assist  Expression Expression assist level: Expresses complex ideas: With extra time/assistive device  Social Interaction Social Interaction assist level: Interacts appropriately with others with medication or extra time (anti-anxiety, antidepressant).  Problem Solving Problem solving assist level: Solves basic problems with no assist  Memory Memory assist level: More than reasonable amount of time   Medical Problem List and Plan: 1.  Decreased functional mobility secondary to MS exacerbation. 5 day course IV Solu-Medrol completed             -Beginning therapies today.             -Discussed fall risk at length with patient today.  He will discuss further with team today.  Evaluations 2.  DVT Prophylaxis/Anticoagulation:  Lovenox. Monitor for any bleeding episodes 3. Pain Management: Neurontin 300 mg 3 times a day as needed 4. Mood: Provide emotional support 5. Neuropsych: This patient is capable of making decisions on his own behalf. 6. Skin/Wound Care: Routine skin checks 7. Fluids/Electrolytes/Nutrition: Today's labs all personally reviewed and within acceptable limits 8. History of tobacco marijuana use. Provide counseling 9. Constipation. Laxative assistance    LOS (Days) 1 A FACE TO FACE EVALUATION WAS PERFORMED  Ranelle Oyster, MD 04/18/2017 9:13 AM

## 2017-04-18 NOTE — Evaluation (Signed)
Physical Therapy Assessment and Plan  Patient Details  Name: [name] MRN: [id number] Date of Birth: 03/01/1981  PT Diagnosis: Abnormality of gait, Ataxia and Coordination disorder Rehab Potential: Fair ELOS: 7-10 dayus   Today's Date: 04/18/2017 PT Individual Time: Session1: 2751-7001; Session: 1500-1620 PT Individual Time Calculation (min): 60 min & 80 min    Problem List:  Patient Active Problem List   Diagnosis Date Noted  . Tobacco abuse   . Steroid-induced hyperglycemia   . Marijuana abuse, continuous 04/12/2017  . Vitamin D deficiency 04/12/2017  . Multiple sclerosis exacerbation ([place])   . Abnormality of gait 05/10/2016  . Weakness   . Hypertension   . Depression   . Multiple sclerosis ([place]) 03/02/2011    Past Medical History:  Past Medical History:  Diagnosis Date  . Depression   . Diabetes mellitus without complication ([place])   . Eye abnormalities    unspecified by pt  . Hypertension   . Multiple sclerosis ([place])   . Weakness    generalized   Past Surgical History:  Past Surgical History:  Procedure Laterality Date  . WISDOM TOOTH EXTRACTION      Assessment & Plan Clinical Impression: Patient is a 36 y.o. year old male with history of hypertension, multiple sclerosis diagnosed [id number] by Dr. YOliver Humhistory of recent exacerbations. Patient on no prescription medications prior to admission. Per chart review and patient,patient livescousins. Reported to be independent ambulating only short distancesand still driving. One level home with ramped entrance.Family in the area checks on him as needed.Presented 04/11/2017 with increasing weakness over the past 5 days as well as blurring of vision. Urine drug screen positive for marijuana. MRI the brain, reviewed showing progressive disease. Per report, MRI brain, thoracic and cervical spine showed significant progression focal white matter disease involving the colossoseptalmargin compatible with  multiple sclerosis and significant increase in demyelination since 2014. Progressive periventricular and brainstem disease. Progression with white matter disease within the spinal cord particularly at the C2 level. Neurology consulted for exacerbation of multiple sclerosis placed on intravenous Solu-Medrol 5 days completed 04/15/2017. Subcutaneous Lovenox for DVT prophylaxis. Physical and occupational therapy evaluations completed with recommendations of physical medicine rehabilitation consult.Patient transferred to CIR on 04/17/2017 .    Patient currently requires mod with mobility secondary to muscle weakness and abnormal tone, unbalanced muscle activation, ataxia and decreased coordination.  Prior to hospitalization, patient was independent  with mobility and lived with Alone in a House home.  Home access is  Ramped entrance.  Patient will benefit from skilled PT intervention to maximize safe functional mobility and minimize fall risk for planned discharge home with 24 hour assist.  [facility]  PT - End of Session Activity Tolerance: Tolerates 30+ min activity with multiple rests Endurance Deficit: Yes Endurance Deficit Description: increased imbalance with ataxia when fatigued and needing multiple rest breaks secondary to fatigue PT Assessment Rehab Potential (ACUTE/IP ONLY): Fair PT Barriers to Discharge: Decreased caregiver support PT Barriers to Discharge Comments: not sure if consistent caregiver available PT Patient demonstrates impairments in the following area(s): Balance;Endurance;Motor;Safety PT Transfers Functional Problem(s): Bed to Chair;Bed Mobility;Car;Furniture;Floor PT Locomotion Functional Problem(s): Ambulation;Stairs PT Plan PT Intensity: Minimum of 1-2 x/day ,45 to 90 minutes PT Frequency: 5 out of 7 days PT Duration Estimated Length of Stay: 7-10 dayus PT Treatment/Interventions: Ambulation/gait training;Stair  training;Disease management/prevention;Balance/vestibular training;DME/adaptive equipment instruction;Patient/family education;Therapeutic Activities;Therapeutic Exercise;Wheelchair propulsion/positioning;Community reintegration;Functional mobility training;UE/LE Strength taining/ROM;Neuromuscular re-education;Splinting/orthotics;UE/LE Coordination activities PT Transfers Anticipated  Outcome(s): supervision PT Locomotion Anticipated Outcome(s): minguard PT Recommendation Recommendations for Other Services: Neuropsych consult;Therapeutic Recreation consult Therapeutic Recreation Interventions: Outing/community reintergration Follow Up Recommendations: Home health PT;24 hour supervision/assistance Patient destination: Home Equipment Recommended: None recommended by PT Equipment Details: states he has walkers, canes at home  [place] seen and evaluation procedures explained.  Performed supine to sit S and transferred to stand min A with imbalance noted.  Patient ambulated with min to mod A and no device to nursing station, then sat in w/c and propelled x 60' with min A due to c/o L thumb pain from recent fall.  Patient negotiated 4 steps with rails and min A cues for sequencing due to R LE weakness.  Patient performed car transfer with min A and cues for technique.  Performed Berg balance assessment, noted high fall risk with score 25/56.  Patient unable to control R LE needing A to keep from falling out when in hooklying.  Patient reports fluctuating symptoms due to fatigue, heat, medications and even humidity.  Patient assisted to room in w/c and pivot to bed min A and left with all needs in reach.  [place]: Patient in bed reporting feeling more weak this pm.  Supine to sit S and transferred to w/c min A for safety and balance.  Assisted in w/c to gym and pt ambulated 80' x 4-5 trailing assistive devices, R AFO's and to determine safest technique.  Noted pt  with increased instability and worse ataxia with increased distance, AFO causing R knee hyperextension and pt throwing balance off when occurs, and noted some improvement with RW and decreasing distance, though pt able to take seated rest on rollator.  Discussion regarding options and pt stated more than once does not plan to use device at home.  Supine on mat for LE therex including R hip clamshell abduction x 10, supine hip and knee control moving leg out and in, R leg on stool under mat for single leg hip extension x 8 reps, LE's on ball with belt around knees for core strengthening with lateral trunk rotation, lower trunk flexion with hip and knee flexion/extension, and bridging on ball.  Patient with wheelchair mechanical issues so exchanged for new w/c from closet.  Patient assisted in w/c to room and assisted to bed left with all needs in reach. .   PT Evaluation Precautions/Restrictions Precautions Precautions: Fall Precaution Comments: Pt reports he falls 6 or 7 times a day secondary to foot drag.  Restrictions Weight Bearing Restrictions: No Pain Pain Assessment Pain Assessment: No/denies pain Home Living/Prior Functioning Home Living Available Help at Discharge: Friend(s);Family;Available 24 hours/day Type of Home: House Home Access: Ramped entrance Home Layout: One level Bathroom Shower/Tub: Chiropodist: Standard Additional Comments: Reports separated from his wife and she and his 2 kids live in [place]  Lives With: Alone Prior Function Level of Independence: Independent with basic ADLs;Independent with transfers;Independent with gait  Able to Take Stairs?: Yes Driving: Yes Vocation: On disability Comments: Difficult to get accurate baseline Vision/Perception  Vision - Assessment Additional Comments: Reports R eye vision okay, L eye blurry  Cognition Overall Cognitive Status: No family/caregiver present to determine baseline cognitive  functioning Arousal/Alertness: Awake/alert Orientation Level: Oriented X4 Awareness: Impaired Problem Solving: Impaired Problem Solving Impairment: Functional basic Executive Function: Self Monitoring Self Monitoring: Impaired Self Monitoring Impairment: Functional basic Behaviors: Impulsive;Poor frustration tolerance;Perseveration Safety/Judgment: Impaired Sensation Sensation Light Touch: Appears Intact Hot/Cold: Appears Intact Coordination Gross Motor Movements are Fluid  and Coordinated: No Fine Motor Movements are Fluid and Coordinated: No Coordination and Movement Description: ataxic gait Heel Shin Test: decreased coordination bilateral R worse than L Motor  Motor Motor: Ataxia;Abnormal tone Motor - Skilled Clinical Observations: R LE syngergistic movements, ataxic gait  Mobility Bed Mobility Bed Mobility: Rolling Right;Rolling Left;Supine to Sit;Sit to Supine;Scooting to [facility] Rolling Right: 5: Supervision Rolling Right Details: Verbal cues for sequencing;Verbal cues for technique Rolling Left: 5: Supervision Rolling Left Details: Verbal cues for technique Supine to Sit: 5: Supervision Supine to Sit Details: Visual cues/gestures for sequencing Sit to Supine: 5: Supervision Sit to Supine - Details: Verbal cues for sequencing Scooting to HOB: 5: Supervision Transfers Transfers: Yes Sit to Stand: 4: Min assist Sit to Stand Details: Verbal cues for safe use of DME/AE;Verbal cues for technique Squat Pivot Transfers: 4: Min Risk manager Details: Verbal cues for safe use of DME/AE Locomotion  Ambulation Ambulation: Yes Ambulation/Gait Assistance: 3: Mod assist Ambulation Distance (Feet): 75 Feet Assistive device: Rolling walker;Straight cane;Rollator Ambulation/Gait Assistance Details: Ataxic with R LE extension synergy and poor safety awareness with increased instability over greater distance.  Gait Gait: Yes Gait Pattern: Impaired Gait Pattern:  Decreased hip/knee flexion - right;Right hip hike;Ataxic;Decreased dorsiflexion - Biochemist, clinical: Yes Wheelchair Assistance: 4: Min Lexicographer: Both upper extremities Wheelchair Parts Management: Needs assistance Distance: 60  Trunk/Postural Assessment  Cervical Assessment Cervical Assessment: Within Functional Limits Thoracic Assessment Thoracic Assessment: Within Functional Limits Lumbar Assessment Lumbar Assessment: Within Functional Limits Postural Control Postural Control: Deficits on evaluation  Balance Balance Balance Assessed: Yes Standardized Balance Assessment Standardized Balance Assessment: Berg Balance Test Berg Balance Test Sit to Stand: Able to stand  independently using hands Standing Unsupported: Able to stand 2 minutes with supervision Sitting with Back Unsupported but Feet Supported on Floor or Stool: Able to sit safely and securely 2 minutes Stand to Sit: Sits independently, has uncontrolled descent Transfers: Able to transfer with verbal cueing and /or supervision Standing Unsupported with Eyes Closed: Able to stand 3 seconds Standing Ubsupported with Feet Together: Needs help to attain position but able to stand for 30 seconds with feet together From Standing, Reach Forward with Outstretched Arm: Reaches forward but needs supervision From Standing Position, Pick up Object from Floor: Able to pick up shoe, needs supervision From Standing Position, Turn to Look Behind Over each Shoulder: Looks behind one side only/other side shows less weight shift Turn 360 Degrees: Needs assistance while turning Standing Unsupported, Alternately Place Feet on Step/Stool: Needs assistance to keep from falling or unable to try Standing Unsupported, One Foot in Front: Able to take small step independently and hold 30 seconds Standing on One Leg: Unable to try or needs assist to prevent fall Total Score: 25 Static Sitting  Balance Static Sitting - Balance Support: Feet supported;No upper extremity supported Static Sitting - Level of Assistance: 5: Stand by assistance Dynamic Sitting Balance Dynamic Sitting - Balance Support: Feet supported;No upper extremity supported Dynamic Sitting - Level of Assistance: 4: Min assist Sitting balance - Comments: donning shoes, socks; occasional minguard Static Standing Balance Static Standing - Balance Support: No upper extremity supported Static Standing - Level of Assistance: 5: Stand by assistance Dynamic Standing Balance Dynamic Standing - Balance Support: No upper extremity supported Dynamic Standing - Level of Assistance: 4: Min assist Extremity Assessment  RUE Assessment RUE Assessment: Within Functional Limits LUE Assessment LUE Assessment: Within Functional Limits RLE Assessment RLE Assessment: Exceptions to [facility] RLE Strength  RLE Overall Strength Comments: AAROM WFL, strength hip flexion 3-/5, knee extension 4/5, knee flexion 2-/5, ankle DF 2+/5 LLE Assessment LLE Assessment: Exceptions to [facility] LLE Strength LLE Overall Strength Comments: AROM WFL, strength hip flexion 3+/5, knee extension 4/5, flexion 3/5, ankle DF 4/5 LLE Tone LLE Tone Comments: increased extensor tone   See Function Navigator for Current Functional Status.   Refer to Care Plan for Long Term Goals  Recommendations for other services: Neuropsych and Therapeutic Recreation  Outing/community reintegration  Discharge Criteria: Patient will be discharged from PT if patient refuses treatment 3 consecutive times without medical reason, if treatment goals not met, if there is a change in medical status, if patient makes no progress towards goals or if patient is discharged from hospital.  The above assessment, treatment plan, treatment alternatives and goals were discussed and mutually agreed upon: by patient  Reginia Naas 04/18/2017, 5:40 PM

## 2017-04-18 NOTE — Progress Notes (Signed)
PMR Admission Coordinator Pre-Admission Assessment  Patient: [name] is an 36 y.o., male MRN: [id number] DOB: 15-May-1980 Height: 5' 10" (177.8 cm) Weight: 80.5 kg (177 lb 6.4 oz)              Insurance Information HMO: Yes   PPO:       PCP:       IPA:       80/20:       OTHER:  Group [id number] PRIMARY: [employer]      Policy#: [id number]      Subscriber: [employer] CM Name: Wyona Almas      Phone#: [phone] X [phone]     Fax#: [phone] Pre-Cert#: [id number]      Employer: Disabled Benefits:  Phone #: [phone]     Name:  On line Eff. Date: 05/01/16     Deduct:  $0      Out of Pocket Max: $5900 (met $649.79)      Life Max: N/A CIR: $295 days 1-6      SNF: $0 days 1-20; $167 days 21-100 Outpatient: medical necessity     Co-Pay: $40/visit Home Health: 100%      Co-Pay: none DME: 80%     Co-Pay: 20% Providers: in network  Emergency Contact Information Contact Information    Name Relation Home Work Mobile   [place] Spouse [phone]  [phone]   Dequante, Tremaine Daughter   [phone]     Current Medical History  Patient Admitting Diagnosis: MS exacerbation  History of Present Illness: A 36 y.o.right handed malewith history of hypertension, multiple sclerosis diagnosed 2095fllowed by Dr. YOliver Humhistory of recent exacerbations. Patient on no prescription medications prior to admission. Per chart review and patient,patient liveswith cousins. Reported to be independent ambulating only short distancesand still driving. One level home with ramped entrance.Family in the area checks on him as needed.Presented 04/11/2017 with increasing weakness over the past 5 days as well as blurring of vision. Urine drug screen positive for marijuana. MRI the brain, reviewed showing progressive disease. Per report, MRI brain, thoracic and cervical spine showed significant progression focal white matter disease involving the colossoseptalmargin compatible with multiple  sclerosis and significant increase in demyelination since 2014. Progressive periventricular and brainstem disease. Progression with white matter disease within the spinal cord particularly at the C2 level. Neurology consulted for exacerbation of multiple sclerosis placed on intravenous Solu-Medrol 5 days completed 04/15/2017. Subcutaneous Lovenox for DVT prophylaxis. Physical and occupational therapy evaluations completed with recommendations of physical medicine rehabilitation consult. Patient to be admitted for a comprehensive inpatient rehabilitation program.  Past Medical History  Past Medical History:  Diagnosis Date  . Depression   . [place] mellitus [place] complication ([place]   . Eye abnormalities    unspecified by pt  . Hypertension   . Multiple sclerosis ([place]   . Weakness    generalized    Family History  family history includes Heart disease in his mother; Multiple sclerosis in his cousin; Other in his father. He was adopted.  Prior Rehab/Hospitalizations: No previous rehab admissions  Has the patient had major surgery during 100 days prior to admission? No  Current Medications   Current Facility-Administered Medications:  .  cholecalciferol (VITAMIN D) tablet 2,000 Units, 2,000 Units, Oral, Daily, Angiulli, Daniel J, PA-C .  enoxaparin (LOVENOX) injection 40 mg, 40 mg, Subcutaneous, Q24H, Angiulli, DLavon Paganini PA-C .  gabapentin (NEURONTIN) tablet 300 mg, 300 mg, Oral, TID PRN, Angiulli, DLavon Paganini PA-C .  nicotine (NICODERM CQ -  dosed in mg/24 hours) patch 21 mg, 21 mg, Transdermal, Daily, Angiulli, Daniel J, PA-C .  ondansetron (ZOFRAN) tablet 4 mg, 4 mg, Oral, Q6H PRN **OR** ondansetron (ZOFRAN) injection 4 mg, 4 mg, Intravenous, Q6H PRN, Angiulli, Lavon Paganini, PA-C .  pantoprazole (PROTONIX) EC tablet 40 mg, 40 mg, Oral, Daily, Angiulli, Daniel J, PA-C .  sorbitol 70 % solution 30 mL, 30 mL, Oral, Daily PRN, Angiulli, Lavon Paganini, PA-C  Patients Current Diet: Diet Carb  Modified Fluid consistency: Thin; Room service appropriate? Yes  Precautions / Restrictions Restrictions Weight Bearing Restrictions: No   Has the patient had 2 or more falls or a fall with injury in the past year?Yes.  Patient reports 3-7 falls daily.  Prior Activity Level    Home Equities trader / Equipment Home Assistive Devices/Equipment: Wheelchair, Environmental consultant (specify type), Eyeglasses  Prior Device Use: Indicate devices/aids used by the patient prior to current illness, exacerbation or injury? None  Prior Functional Level    Self Care: Did the patient need help bathing, dressing, using the toilet or eating?  Independent  Indoor Mobility: Did the patient need assistance with walking from room to room (with or without device)? Independent  Stairs: Did the patient need assistance with internal or external stairs (with or without device)? Needed some help.  Has difficulty coming down stairs.  Functional Cognition: Did the patient need help planning regular tasks such as shopping or remembering to take medications? Independent  Current Functional Level Cognition  Orientation Level: Oriented X4    Extremity Assessment (includes Sensation/Coordination)          ADLs       Mobility       Transfers       Ambulation / Gait / Stairs / Wheelchair Mobility       Posture / Balance      Special needs/care consideration BiPAP/CPAP No CPM No Continuous Drip IV No Dialysis No      Life Vest No Oxygen No Special Bed No Trach Size No Wound Vac (area) No     Skin No                           Bowel mgmt: Last BM 04/16/17 Bladder mgmt: Voiding in bathroom with assistacne Diabetic mgmt No    Previous Home Environment Living Arrangements: Alone, Non-relatives/Friends Home Care Services: No  Discharge Living Setting Does the patient have any problems obtaining your medications?: No  Social/Family/Support Systems    Goals/Additional Needs    Decrease burden of  Care through IP rehab admission: N/A  Possible need for SNF placement upon discharge: Not planned  Patient Condition: This patient's condition remains as documented in the consult dated 04/16/17, in which the Rehabilitation Physician determined and documented that the patient's condition is appropriate for intensive rehabilitative care in an inpatient rehabilitation facility pending: re-evaluations done by PT/OT and have insurance approval for inpatient rehab admission; patient is willing to participate and wants to come to inpatient rehab. These areas have been addressed.  Will admit to inpatient rehab today.  Preadmission Screen Completed By:  Retta Diones, 04/18/2017 9:51 AM ______________________________________________________________________   Discussed status with Dr. Naaman Plummer on 04/17/17 at 1301 and received telephone approval for admission today.  Admission Coordinator:  Retta Diones, time 1303/Date 04/17/17

## 2017-04-18 NOTE — Evaluation (Signed)
Occupational Therapy Assessment and Plan  Patient Details  Name: [name] MRN: [id number] Date of Birth: [birth year]  OT Diagnosis: ataxia, muscle weakness (generalized) and coordination disorder Rehab Potential: Rehab Potential (ACUTE ONLY): Good ELOS: 7-10 days   Today's Date: 04/18/2017 OT Individual Time: 7824-2353 OT Individual Time Calculation (min): 85 min     Problem List:  Patient Active Problem List   Diagnosis Date Noted  . Tobacco abuse   . Steroid-induced hyperglycemia   . Marijuana abuse, continuous 04/12/2017  . Vitamin D deficiency 04/12/2017  . Multiple sclerosis exacerbation ([place])   . Abnormality of gait 05/10/2016  . Weakness   . Hypertension   . Depression   . Multiple sclerosis ([place]) 03/02/2011    Past Medical History:  Past Medical History:  Diagnosis Date  . Depression   . Diabetes mellitus without complication ([place])   . Eye abnormalities    unspecified by pt  . Hypertension   . Multiple sclerosis ([place])   . Weakness    generalized   Past Surgical History:  Past Surgical History:  Procedure Laterality Date  . WISDOM TOOTH EXTRACTION      Assessment & Plan Clinical Impression: Patient is a 36 y.o. year old male with history of hypertension, multiple sclerosis diagnosed 2069fllowed by Dr. YOliver Humhistory of recent exacerbations. Patient on no prescription medications prior to admission. Per chart review and patient,patient livescousins. Reported to be independent ambulating only short distancesand still driving. One level home with ramped entrance.Family in the area checks on him as needed.Presented 04/11/2017 with increasing weakness over the past 5 days as well as blurring of vision. Urine drug screen positive for marijuana. MRI the brain, reviewed showing progressive disease. Per report, MRI brain, thoracic and cervical spine showed significant progression focal white matter disease involving the colossoseptalmargin compatible with  multiple sclerosis and significant increase in demyelination since 2014. Progressive periventricular and brainstem disease. Progression with white matter disease within the spinal cord particularly at the C2 level. Neurology consulted for exacerbation of multiple sclerosis placed on intravenous Solu-Medrol 5 days completed 04/15/2017. Subcutaneous Lovenox for DVT prophylaxis. Physical and occupational therapy evaluations completed with recommendations of physical medicine rehabilitation consult.Patient transferred to CIR on 04/17/2017 .    Patient currently requires min - mod with basic self-care skills secondary to muscle weakness, decreased cardiorespiratoy endurance, ataxia, decreased coordination and decreased motor planning and decreased sitting balance, decreased standing balance and decreased balance strategies.  Prior to hospitalization, patient could complete ADLs with min.  Patient will benefit from skilled intervention to increase independence with basic self-care skills prior to discharge home with care partner.  Anticipate patient will require intermittent supervision and minimal physical assistance and follow up outpatient.  OT - End of Session Activity Tolerance: Decreased this session Endurance Deficit: Yes Endurance Deficit Description: increased imbalance with ataxia when fatigued and needing multiple rest breaks secondary to fatigue OT Assessment Rehab Potential (ACUTE ONLY): Good OT Barriers to Discharge: Decreased caregiver support;Home environment access/layout;Lack of/limited family support OT Patient demonstrates impairments in the following area(s): Balance;Safety;Behavior;Endurance;Motor;Pain OT Basic ADL's Functional Problem(s): Grooming;Bathing;Dressing;Toileting OT Transfers Functional Problem(s): Toilet;Tub/Shower OT Additional Impairment(s): None OT Plan OT Intensity: Minimum of 1-2 x/day, 45 to 90 minutes OT Frequency: 5 out of 7 days OT Duration/Estimated Length  of Stay: 7-10 days OT Treatment/Interventions: Balance/vestibular training;Discharge planning;Self Care/advanced ADL retraining;Therapeutic Activities;UE/LE Coordination activities;Functional mobility training;Patient/family education;Therapeutic Exercise;Wheelchair propulsion/positioning;UE/LE Strength taining/ROM;Psychosocial support;DME/adaptive equipment instruction OT Self Feeding Anticipated Outcome(s): n/a OT Basic Self-Care Anticipated Outcome(s): Mod I -  Min A OT Toileting Anticipated Outcome(s): min A OT Bathroom Transfers Anticipated Outcome(s): min A OT Recommendation Recommendations for Other Services: Neuropsych consult;Therapeutic Recreation consult Therapeutic Recreation Interventions: Outing/community reintergration;Pet therapy Patient destination: Home Follow Up Recommendations: Home health OT Equipment Recommended: To be determined   Skilled Therapeutic Intervention Upon entering the room, pt supine in bed and requiring increased encouragement for participation this session.Pt performed supine >sit with supervision. Pt ambulating with hand held assistance with min - mod A for balance. Pt's gait very ataxic and pt ambulates 10' into bathroom for shower. Pt bathing from TTB with steady assistance when attempting to stand. Pt with poor safety awareness and requiring mod cues throughout session for safety. Pt donning hospital gown and taking seated rest break at sink before returning to bed. OT educated pt on OT purpose, POC, and goals with pt verbalizing understanding and agreement.   OT Evaluation Precautions/Restrictions  Precautions Precautions: Fall Precaution Comments: Pt reports he falls 6 or 7 times a day secondary to foot drag.  Restrictions Weight Bearing Restrictions: No Vital Signs Therapy Vitals Temp: 99.1 F (37.3 C) Temp Source: Oral Pulse Rate: 76 Resp: 18 BP: 136/85 Patient Position (if appropriate): Lying Oxygen Therapy SpO2: 100 % O2 Device: Not  Delivered Pain Pain Assessment Pain Assessment: No/denies pain Home Living/Prior Functioning Home Living Family/patient expects to be discharged to:: Private residence Living Arrangements: Alone, Non-relatives/Friends Available Help at Discharge: Friend(s), Family, Available 24 hours/day Type of Home: House Home Access: Ramped entrance Home Layout: One level Bathroom Shower/Tub: Chiropodist: Standard Additional Comments: Reports separated from his wife and she and his 2 kids live in [place]  Lives With: Alone Prior Function Level of Independence: Independent with basic ADLs, Independent with transfers, Independent with gait  Able to Take Stairs?: Yes Driving: Yes Vocation: On disability Comments: Difficult to get accurate baseline Vision Baseline Vision/History: Wears glasses Wears Glasses: At all times Patient Visual Report: No change from baseline Additional Comments: Reports R eye vision okay, L eye blurry Cognition Overall Cognitive Status: No family/caregiver present to determine baseline cognitive functioning Arousal/Alertness: Awake/alert Orientation Level: Person;Place;Situation Person: Oriented Place: Oriented Situation: Oriented Year: 2018 Month: December Day of Week: Correct Immediate Memory Recall: Sock;Blue;Bed Memory Recall: Blue;Sock;Bed Memory Recall Sock: Without Cue Memory Recall Blue: Without Cue Memory Recall Bed: Without Cue Awareness: Impaired Problem Solving: Impaired Problem Solving Impairment: Functional basic Executive Function: Self Monitoring Self Monitoring: Impaired Self Monitoring Impairment: Functional basic Behaviors: Impulsive;Poor frustration tolerance;Perseveration Safety/Judgment: Impaired Sensation Sensation Light Touch: Appears Intact Hot/Cold: Appears Intact Coordination Gross Motor Movements are Fluid and Coordinated: No Fine Motor Movements are Fluid and Coordinated: No Coordination and Movement  Description: ataxic gait Heel Shin Test: decreased coordination bilateral R worse than L Motor  Motor Motor: Ataxia;Abnormal tone Motor - Skilled Clinical Observations: R LE syngergistic movements, ataxic gait Mobility  Bed Mobility Bed Mobility: Rolling Right;Rolling Left;Supine to Sit;Sit to Supine;Scooting to [facility] Rolling Right: 5: Supervision Rolling Right Details: Verbal cues for sequencing;Verbal cues for technique Rolling Left: 5: Supervision Rolling Left Details: Verbal cues for technique Supine to Sit: 5: Supervision Supine to Sit Details: Visual cues/gestures for sequencing Sit to Supine: 5: Supervision Sit to Supine - Details: Verbal cues for sequencing Scooting to HOB: 5: Supervision Transfers Sit to Stand: 4: Min assist Sit to Stand Details: Verbal cues for safe use of DME/AE;Verbal cues for technique  Trunk/Postural Assessment  Cervical Assessment Cervical Assessment: Within Functional Limits Thoracic Assessment Thoracic Assessment: Within Functional Limits Lumbar Assessment  Lumbar Assessment: Within Functional Limits Postural Control Postural Control: Deficits on evaluation  Balance Balance Balance Assessed: Yes Standardized Balance Assessment Standardized Balance Assessment: Berg Balance Test Berg Balance Test Sit to Stand: Able to stand  independently using hands Standing Unsupported: Able to stand 2 minutes with supervision Sitting with Back Unsupported but Feet Supported on Floor or Stool: Able to sit safely and securely 2 minutes Stand to Sit: Sits independently, has uncontrolled descent Transfers: Able to transfer with verbal cueing and /or supervision Standing Unsupported with Eyes Closed: Able to stand 3 seconds Standing Ubsupported with Feet Together: Needs help to attain position but able to stand for 30 seconds with feet together From Standing, Reach Forward with Outstretched Arm: Reaches forward but needs supervision From Standing Position, Pick  up Object from Floor: Able to pick up shoe, needs supervision From Standing Position, Turn to Look Behind Over each Shoulder: Looks behind one side only/other side shows less weight shift Turn 360 Degrees: Needs assistance while turning Standing Unsupported, Alternately Place Feet on Step/Stool: Needs assistance to keep from falling or unable to try Standing Unsupported, One Foot in Front: Able to take small step independently and hold 30 seconds Standing on One Leg: Unable to try or needs assist to prevent fall Total Score: 25 Static Sitting Balance Static Sitting - Balance Support: Feet supported;No upper extremity supported Static Sitting - Level of Assistance: 5: Stand by assistance Dynamic Sitting Balance Dynamic Sitting - Balance Support: Feet supported;No upper extremity supported Dynamic Sitting - Level of Assistance: 4: Min assist Sitting balance - Comments: donning shoes, socks; occasional minguard Static Standing Balance Static Standing - Balance Support: No upper extremity supported Static Standing - Level of Assistance: 5: Stand by assistance Dynamic Standing Balance Dynamic Standing - Balance Support: No upper extremity supported Dynamic Standing - Level of Assistance: 4: Min assist Extremity/Trunk Assessment RUE Assessment RUE Assessment: Within Functional Limits LUE Assessment LUE Assessment: Within Functional Limits   See Function Navigator for Current Functional Status.   Refer to Care Plan for Long Term Goals  Recommendations for other services: Neuropsych and Therapeutic Recreation  Pet therapy and Outing/community reintegration   Discharge Criteria: Patient will be discharged from OT if patient refuses treatment 3 consecutive times without medical reason, if treatment goals not met, if there is a change in medical status, if patient makes no progress towards goals or if patient is discharged from hospital.  The above assessment, treatment plan, treatment  alternatives and goals were discussed and mutually agreed upon: by patient  Gypsy Decant 04/18/2017, 5:42 PM

## 2017-04-18 NOTE — Progress Notes (Signed)
Patient information reviewed and entered into eRehab system by Canyon Willow, RN, CRRN, PPS Coordinator.  Information including medical coding and functional independence measure will be reviewed and updated through discharge.     Per nursing patient was given "Data Collection Information Summary for Patients in Inpatient Rehabilitation Facilities with attached "Privacy Act Statement-Health Care Records" upon admission.  

## 2017-04-19 ENCOUNTER — Inpatient Hospital Stay (HOSPITAL_COMMUNITY): Payer: Medicare Other

## 2017-04-19 ENCOUNTER — Inpatient Hospital Stay (HOSPITAL_COMMUNITY): Payer: Medicare Other | Admitting: Occupational Therapy

## 2017-04-19 ENCOUNTER — Other Ambulatory Visit: Payer: Self-pay

## 2017-04-19 NOTE — Progress Notes (Signed)
Patient witnessed by multiple staff members on Rehab up and moving around the room. This RN has education patient multiple times to please call for staff assistance before ambulating alone in the room. The patient is a high-plus fall risk due to multiple falls at home related to MS. He refuses teaching from staff for safety. Staff will continue with safety education teaching.

## 2017-04-19 NOTE — Progress Notes (Signed)
Physical Therapy Session Note  Patient Details  Name: Val EagleKeith S Angelos MRN: [id number] Date of Birth: 1980/07/23  Today's Date: 04/19/2017 PT Individual Time: 1430-1510 PT Individual Time Calculation (min): 40 min   Short Term Goals: Week 1:  PT Short Term Goal 1 (Week 1): STG=LTG due to ELOS  Skilled Therapeutic Interventions/Progress Updates:    Session focused on NMR to address balance, coordination and postural control re-training during functional mobility, use of Biodex for random control program (x 8 min varying level of difficulty by circle speed (smallest circle) and varying compliant surface (level 10, 7, and 5), Dynavision program A without UE support, and gait training with RW (with weights for increased proprioceptive feedback) x 120'. Pt too fatigued after gait to continue with further standing activities. Education provided throughout on overall safety and fall risk - pt declines use of bed alarm or chair alarm and continues to be encouraged to call before moving in room. Pt also declines using AD upon d/c despite recommendations. Continue with POC and progress towards safe discharge.   Therapy Documentation Precautions:  Precautions Precautions: Fall Precaution Comments: Pt reports he falls 6 or 7 times a day secondary to foot drag.  Restrictions Weight Bearing Restrictions: No Pain: Reports thumb pain with use - repositioned. No intervention needed.   See Function Navigator for Current Functional Status.   Therapy/Group: Individual Therapy  Karolee StampsGray, Marlena Barbato Darrol PokeBrescia  Jacarius Handel B. Koa Zoeller, PT, DPT  04/19/2017, 3:17 PM

## 2017-04-19 NOTE — Progress Notes (Signed)
Physical Therapy Session Note  Patient Details  Name: [name] MRN: [id number] Date of Birth: Mar 20, 1981  Today's Date: 04/19/2017 PT Individual Time: 1050-1205 PT Individual Time Calculation (min): 75 min   Short Term Goals: Week 1:  PT Short Term Goal 1 (Week 1): STG=LTG due to ELOS  Skilled Therapeutic Interventions/Progress Updates:    Patient in room in w/c.  Propelled with w/c x 100' S and c/o L thumb hitting w/c and painful so pushed rest of the way to therapy gym.  Patient in gym transferred to mat for core strengthening in quadruped, tall kneeling and semi sidelying with therapy ball and with blue bench.  Min A needed in quadruped without ball and with ball under trunk for arm vs leg lifting.  Patient ambulated with RW with two weights strapped to sides of walker for stability with min A x 70' x 3 with cues for step length and hip control.  Patient in w/c assisted to room and left with all needs in reach.   Therapy Documentation Precautions:  Precautions Precautions: Fall Precaution Comments: Pt reports he falls 6 or 7 times a day secondary to foot drag.  Restrictions Weight Bearing Restrictions: No Pain: Pain Assessment Pain Assessment: No/denies pain Pain Score: 0-No pain   See Function Navigator for Current Functional Status.   Therapy/Group: Individual Therapy  [name] 04/19/2017, 12:55 PM

## 2017-04-19 NOTE — Progress Notes (Signed)
Occupational Therapy Session Note  Patient Details  Name: [name] MRN: [id number] Date of Birth: [birth year]  Today's Date: 04/19/2017 OT Individual Time: 0930-1015 OT Individual Time Calculation (min): 45 min    Short Term Goals: Week 1:  OT Short Term Goal 1 (Week 1): STGs=LTGs secondary to estimated LOS  Skilled Therapeutic Interventions/Progress Updates:    Pt seated in w/c with BLEs elevated on room chair.  Pt agreeable to participating in therapy session, stating he wanted to work with therapy in order to improve his balance and ambulation for DC home.  OT propelled pt in w/c down to gym and pt participated in dynamic standing balance activity, laterally stepping towards bilateral sides to retrieve Jenga blocks on floor for improved dynamic balance, coordination, and LE strength.  Pt required steadying assist when stepping towards L and reaching with LUE but required Min A with one episode of Mod A to correct LOB when reaching towards R.  While standing, pt demonstrated significantly difficulty stacking Jenga blocks into tower, frequently dropping blocks or knocking tower over.  Improvements noted while seated.  Pt returned to room and ambulated in room to gather items from lower drawers with Min A to steady, no AD.  Pt stated he is unwilling to utilize AD at home.  Pt left seated in w/c with all needs in reach upon OT departure.  Therapy Documentation Precautions:  Precautions Precautions: Fall Precaution Comments: Pt reports he falls 6 or 7 times a day secondary to foot drag.  Restrictions Weight Bearing Restrictions: No Pain: Pain Assessment Pain Assessment: No/denies pain Pain Score: 0-No pain  See Function Navigator for Current Functional Status.   Therapy/Group: Individual Therapy  Henry Mayolexandra  [name] 04/19/2017, 12:26 PM

## 2017-04-19 NOTE — Progress Notes (Signed)
Patient inconsistent with compliance with ringing for assistance to BR. Patient slammed BR door in front of nurse while going to BR once and on another occasion told nurse to,"Close the d___ door."

## 2017-04-19 NOTE — Progress Notes (Signed)
[facility] PHYSICAL MEDICINE & REHABILITATION     PROGRESS NOTE    Subjective/Complaints: Left thumb a little sore related to a recent fall he had  ROS: pt denies nausea, vomiting, diarrhea, cough, shortness of breath or chest pain   Objective: Vital Signs: Blood pressure 136/75, pulse 76, temperature 99.3 F (37.4 C), temperature source Oral, resp. rate 18, height 5\' 10"  (1.778 m), weight 80.5 kg (177 lb 6.4 oz), SpO2 100 %. No results found. Recent Labs    04/17/17 1710 04/18/17 0554  WBC 3.6* 3.5*  HGB 14.0 13.7  HCT 41.3 41.2  PLT 208 209   Recent Labs    04/17/17 1710 04/18/17 0554  NA  --  137  K  --  4.1  CL  --  100*  GLUCOSE  --  101*  BUN  --  12  CREATININE 0.98 0.81  CALCIUM  --  8.8*   CBG (last 3)  Recent Labs    04/16/17 0944 04/17/17 0716  GLUCAP 179* 87    Wt Readings from Last 3 Encounters:  04/17/17 80.5 kg (177 lb 6.4 oz)  04/16/17 78.5 kg (173 lb 1 oz)  07/26/16 79.6 kg (175 lb 8 oz)    Physical Exam:  Constitutional: He appears well-developed.  Smells of tobacco  HENT:  Head: Normocephalic.  Eyes: EOM are normal. Right eye exhibits no discharge. Left eye exhibits no discharge.  Neck: Normal range of motion. Neck supple. Thyromegaly present.  Cardiovascular: RRR without murmur. No JVD    Respiratory: CTA Bilaterally without wheezes or rales. Normal effort   Skin. Warm and dry Musculoskeletal: He exhibits noedema. Minimal tenderness left thumb. No swelling Neurological: He isalertand oriented to person, place, and time. Motor: LUE/LLE: 4+/5 prox to distal.  RUE: 4/5. RLE 4-/5.  Sensation intact to light touchgrossly although he reports mild sensory loss on right. DTR's are 3+ R>L. --- Motor and sensory exam generally intact Cognitively displays reasonable insight and awareness.  Marland Kitchen. Psych: Pleasant and cooperative    Assessment/Plan: 1.  Functional deficits secondary to multiple sclerosis exacerbation which require 3+  hours per day of interdisciplinary therapy in a comprehensive inpatient rehab setting. Physiatrist is providing close team supervision and 24 hour management of active medical problems listed below. Physiatrist and rehab team continue to assess barriers to discharge/monitor patient progress toward functional and medical goals.  Function:  Bathing Bathing position   Position: Shower  Bathing parts Body parts bathed by patient: Left arm, Chest, Abdomen, Right arm, Front perineal area, Buttocks, Right upper leg, Left upper leg Body parts bathed by helper: Left lower leg, Back, Right lower leg  Bathing assist Assist Level: Touching or steadying assistance(Pt > 75%)      Upper Body Dressing/Undressing Upper body dressing   What is the patient wearing?: Hospital gown                Upper body assist Assist Level: Set up, Supervision or verbal cues      Lower Body Dressing/Undressing Lower body dressing   What is the patient wearing?: Non-skid slipper socks         Non-skid slipper socks- Performed by patient: Don/doff right sock, Don/doff left sock                    Lower body assist        Toileting Toileting Toileting activity did not occur: No continent bowel/bladder event        Toileting assist  Transfers Chair/bed transfer   Chair/bed transfer method: Ambulatory, Stand pivot Chair/bed transfer assist level: Touching or steadying assistance (Pt > 75%) Chair/bed transfer assistive device: Armrests     Locomotion Ambulation     Max distance: 30' Assist level: Moderate assist (Pt 50 - 74%)   Wheelchair   Type: Manual Max wheelchair distance: 60 Assist Level: Touching or steadying assistance (Pt > 75%)  Cognition Comprehension Comprehension assist level: Follows basic conversation/direction with no assist  Expression Expression assist level: Expresses complex ideas: With extra time/assistive device  Social Interaction Social Interaction assist  level: Interacts appropriately 90% of the time - Needs monitoring or encouragement for participation or interaction.  Problem Solving Problem solving assist level: Solves basic 90% of the time/requires cueing < 10% of the time  Memory Memory assist level: More than reasonable amount of time   Medical Problem List and Plan: 1.  Decreased functional mobility secondary to MS exacerbation. 5 day course IV Solu-Medrol completed             -continue therapies. Discussed safety awareness again today 2.  DVT Prophylaxis/Anticoagulation: Lovenox. Monitor for any bleeding episodes 3. Pain Management: Neurontin 300 mg 3 times a day as needed 4. Mood: Provide emotional support 5. Neuropsych: This patient is capable of making decisions on his own behalf. 6. Skin/Wound Care: Routine skin checks 7. Fluids/Electrolytes/Nutrition: Today's labs all personally reviewed and within acceptable limits 8. History of tobacco marijuana use. Provide counseling 9. Constipation. Laxative assistance    LOS (Days) 2 A FACE TO FACE EVALUATION WAS PERFORMED  Ranelle Oyster, MD 04/19/2017 9:28 AM

## 2017-04-19 NOTE — Progress Notes (Signed)
Physical Therapy Session Note  Patient Details  Name: [name] MRN: [id number] Date of Birth: Jul 16, 1980  Today's Date: 04/19/2017 PT Individual Time: 0830-0900 PT Individual Time Calculation (min): 30 min   Short Term Goals: Week 1:  PT Short Term Goal 1 (Week 1): STG=LTG due to ELOS  Skilled Therapeutic Interventions/Progress Updates:    Session focused on NMR to address balance re-training, postural control impairments, coordination due to ataxia, and functional strengthening. Functional transfers with steadying assist for balance and cues for safety throughout session. Blocked practice sit <> stands, lateral and forward/retro gait with focus on controlled movement pattern and regaining balance when LOB occurs (min to mod assist), short distance gait without AD with min to mod assist for balance recovery, and alternating toe taps to 4" step with min to mod assist for stabilizing for support. Pt requires redirection throughout session as he becomes very tangential. Educated on importance of safety and increased fall risk.   Therapy Documentation Precautions:  Precautions Precautions: Fall Precaution Comments: Pt reports he falls 6 or 7 times a day secondary to foot drag.  Restrictions Weight Bearing Restrictions: No Pain: Pain Assessment Pain Assessment: 0-10 Pain Score: 4  Pain Type: Acute pain Pain Location: Finger (Comment which one)(left thumb) Pain Orientation: Left Pain Descriptors / Indicators: Discomfort Pain Frequency: Intermittent Pain Onset: Gradual Patients Stated Pain Goal: 1 Pain Intervention(s): Rest   See Function Navigator for Current Functional Status.   Therapy/Group: Individual Therapy  Karolee Stamps Darrol Poke, PT, DPT  04/19/2017, 9:15 AM

## 2017-04-20 ENCOUNTER — Inpatient Hospital Stay (HOSPITAL_COMMUNITY): Payer: Medicare Other

## 2017-04-20 ENCOUNTER — Inpatient Hospital Stay (HOSPITAL_COMMUNITY): Payer: Medicare Other | Admitting: Occupational Therapy

## 2017-04-20 MED ORDER — AMANTADINE HCL 100 MG PO CAPS
100.0000 mg | ORAL_CAPSULE | Freq: Two times a day (BID) | ORAL | Status: DC
  Administered 2017-04-20 – 2017-04-25 (×10): 100 mg via ORAL
  Filled 2017-04-20 (×10): qty 1

## 2017-04-20 NOTE — Care Management Note (Signed)
[facility] Individual Statement of Services  Patient Name:  Val EagleKeith S Owensby  Date:  04/20/2017  Welcome to the [facility]patient [facility].  Our goal is to provide you with an individualized program based on your diagnosis and situation, designed to meet your specific needs.  With this comprehensive rehabilitation program, you will be expected to participate in at least 3 hours of rehabilitation therapies Monday-Friday, with modified therapy programming on the weekends.  Your rehabilitation program will include the following services:  Physical Therapy (PT), Occupational Therapy (OT), 24 hour per day rehabilitation nursing, Therapeutic Recreaction (TR), Neuropsychology, Case Management (Social Worker), Rehabilitation Medicine, Nutrition Services and Pharmacy Services  Weekly team conferences will be held on Tuesdays to discuss your progress.  Your Social Worker will talk with you frequently to get your input and to update you on team discussions.  Team conferences with you and your family in attendance may also be held.  Expected length of stay: 7-10 days    Overall anticipated outcome: supervision to minimal assistance  Depending on your progress and recovery, your program may change. Your Social Worker will coordinate services and will keep you informed of any changes. Your Social Worker's name and contact numbers are listed  below.  The following services may also be recommended but are not provided by the Inpatient Rehabilitation Center:   Driving Evaluations  Home Health Rehabiltiation Services  Outpatient Rehabilitation Services  Vocational Rehabilitation   Arrangements will be made to provide these services after discharge if needed.  Arrangements include referral to agencies that provide these services.  Your insurance has been verified to be:  [employer] Your primary doctor is:  Lonie Peakathan Conroy  Pertinent information will be shared with your doctor  and your insurance company.  Social Worker:  [place] Artelia Game, [place] [phone] or ([phone]) [phone]   Information discussed with and copy given to patient by: Amada JupiterHOYLE, Marionna Gonia, 04/20/2017, 1:34 PM

## 2017-04-20 NOTE — Progress Notes (Addendum)
[facility] PHYSICAL MEDICINE & REHABILITATION     PROGRESS NOTE    Subjective/Complaints: Patient up at edge of bed.  Therapy having problems with patient impulsivity.  Therapy noting this as well. Complains of feeling fatigued,decreased arousal. Wonders if his vitamin b levels are too low.   ROS: pt denies nausea, vomiting, diarrhea, cough, shortness of breath or chest pain   Objective: Vital Signs: Blood pressure 140/77, pulse (!) 54, temperature 98 F (36.7 C), temperature source Oral, resp. rate 10, height 5\' 10"  (1.778 m), weight 80.5 kg (177 lb 6.4 oz), SpO2 100 %. No results found. Recent Labs    04/17/17 1710 04/18/17 0554  WBC 3.6* 3.5*  HGB 14.0 13.7  HCT 41.3 41.2  PLT 208 209   Recent Labs    04/17/17 1710 04/18/17 0554  NA  --  137  K  --  4.1  CL  --  100*  GLUCOSE  --  101*  BUN  --  12  CREATININE 0.98 0.81  CALCIUM  --  8.8*   CBG (last 3)  No results for input(s): GLUCAP in the last 72 hours.  Wt Readings from Last 3 Encounters:  04/17/17 80.5 kg (177 lb 6.4 oz)  04/16/17 78.5 kg (173 lb 1 oz)  07/26/16 79.6 kg (175 lb 8 oz)    Physical Exam:  Constitutional: He appears well-developed.  Smells of tobacco  HENT:  Head: Normocephalic.  Eyes: EOM are normal. Right eye exhibits no discharge. Left eye exhibits no discharge.  Neck: Normal range of motion. Neck supple. Thyromegaly present.  Cardiovascular: RRR without murmur. No JVD     Respiratory: CTA Bilaterally without wheezes or rales. Normal effort   Skin. Warm and dry Musculoskeletal: He exhibits noedema. Minimal tenderness left thumb. No swelling Neurological: He isalertand oriented to person, place, and time. Motor: LUE/LLE: 4+/5 prox to distal.  RUE: 4/5. RLE 4-/5.  Sensation intact to light touchgrossly although he reports mild sensory loss on right. DTR's are 3+ R>L. --- Motor and sensory exam generally intact Cognitively displays reasonable insight and awareness.  Marland Kitchen Psych:  Pleasant and cooperative    Assessment/Plan: 1.  Functional deficits secondary to multiple sclerosis exacerbation which require 3+ hours per day of interdisciplinary therapy in a comprehensive inpatient rehab setting. Physiatrist is providing close team supervision and 24 hour management of active medical problems listed below. Physiatrist and rehab team continue to assess barriers to discharge/monitor patient progress toward functional and medical goals.  Function:  Bathing Bathing position   Position: Shower  Bathing parts Body parts bathed by patient: Left arm, Chest, Abdomen, Right arm, Front perineal area, Buttocks, Right upper leg, Left upper leg Body parts bathed by helper: Left lower leg, Back, Right lower leg  Bathing assist Assist Level: Touching or steadying assistance(Pt > 75%)      Upper Body Dressing/Undressing Upper body dressing   What is the patient wearing?: Hospital gown                Upper body assist Assist Level: Set up, Supervision or verbal cues      Lower Body Dressing/Undressing Lower body dressing   What is the patient wearing?: Non-skid slipper socks         Non-skid slipper socks- Performed by patient: Don/doff right sock, Don/doff left sock                    Lower body assist        Toileting  Toileting Toileting activity did not occur: No continent bowel/bladder event Toileting steps completed by patient: Adjust clothing prior to toileting, Performs perineal hygiene, Adjust clothing after toileting      Toileting assist     Transfers Chair/bed transfer   Chair/bed transfer method: Ambulatory, Stand pivot Chair/bed transfer assist level: Supervision or verbal cues Chair/bed transfer assistive device: Armrests     Locomotion Ambulation     Max distance: 70 Assist level: Touching or steadying assistance (Pt > 75%)   Wheelchair   Type: Manual Max wheelchair distance: 60 Assist Level: Touching or steadying  assistance (Pt > 75%)  Cognition Comprehension Comprehension assist level: Follows basic conversation/direction with extra time/assistive device  Expression Expression assist level: Expresses basic needs/ideas: With no assist  Social Interaction Social Interaction assist level: Interacts appropriately 75 - 89% of the time - Needs redirection for appropriate language or to initiate interaction.  Problem Solving Problem solving assist level: Solves basic 90% of the time/requires cueing < 10% of the time  Memory Memory assist level: Recognizes or recalls 90% of the time/requires cueing < 10% of the time   Medical Problem List and Plan: 1.  Decreased functional mobility secondary to MS exacerbation. 5 day course IV Solu-Medrol completed             -continue therapies.  Safety awareness remains an issue.  Patient states that he is willing to work with the team regarding safety and following our direction.  He does not want a leave without feeling better about his balance and safety 2.  DVT Prophylaxis/Anticoagulation: Lovenox. Monitor for any bleeding episodes 3. Pain Management: Neurontin 300 mg 3 times a day as needed 4. Mood: Provide emotional support 5. Neuropsych: This patient is capable of making decisions on his own behalf.   -will add amantadine to help with day time energy/arousal.    -can check b12 level with next labs 6. Skin/Wound Care: Routine skin checks 7. Fluids/Electrolytes/Nutrition: Good oral intake 8. History of tobacco marijuana use. Provide counseling 9. Constipation. Laxative assistance    LOS (Days) 3 A FACE TO FACE EVALUATION WAS PERFORMED  Ranelle OysterSWARTZ,ZACHARY T, MD 04/20/2017 9:49 AM

## 2017-04-20 NOTE — IPOC Note (Signed)
Overall Plan of Care [facility]) Patient Details Name: [name] MRN: [id number] DOB: [birth year]  Admitting Diagnosis: <principal problem not specified> MS exacerbation  Hospital Problems: Active Problems:   Multiple sclerosis exacerbation (HCC)     Functional Problem List: Nursing Medication Management, Safety  PT Balance, Endurance, Motor, Safety  OT Balance, Safety, Behavior, Endurance, Motor, Pain  SLP    TR         Basic ADL's: OT Grooming, Bathing, Dressing, Toileting     Advanced  ADL's: OT       Transfers: PT Bed to Chair, Bed Mobility, Car, Furniture, Floor  OT Toilet, Research scientist (life sciences): PT Ambulation, Stairs     Additional Impairments: OT None  SLP        TR      Anticipated Outcomes Item Anticipated Outcome  Self Feeding n/a  Swallowing      Basic self-care  Mod I - Min A  Toileting  min A   Bathroom Transfers min A  Bowel/Bladder  mod I   Transfers  supervision  Locomotion  minguard  Communication     Cognition     Pain  less than 3 out of 10   Safety/Judgment  mod I   Therapy Plan: PT Intensity: Minimum of 1-2 x/day ,45 to 90 minutes PT Frequency: 5 out of 7 days PT Duration Estimated Length of Stay: 7-10 dayus OT Intensity: Minimum of 1-2 x/day, 45 to 90 minutes OT Frequency: 5 out of 7 days OT Duration/Estimated Length of Stay: 7-10 days      Team Interventions: Nursing Interventions Patient/Family Education, Medication Management, Discharge Planning, Disease Management/Prevention  PT interventions Ambulation/gait training, Stair training, Disease management/prevention, Warden/ranger, Fish farm manager, Patient/family education, Therapeutic Activities, Therapeutic Exercise, Wheelchair propulsion/positioning, Community reintegration, Functional mobility training, UE/LE Strength taining/ROM, Neuromuscular re-education, Splinting/orthotics, UE/LE Coordination activities  OT Interventions  Warden/ranger, Discharge planning, Self Care/advanced ADL retraining, Therapeutic Activities, UE/LE Coordination activities, Functional mobility training, Patient/family education, Therapeutic Exercise, Wheelchair propulsion/positioning, UE/LE Strength taining/ROM, Psychosocial support, DME/adaptive equipment instruction  SLP Interventions    TR Interventions    SW/CM Interventions Discharge Planning, Psychosocial Support, Patient/Family Education   Barriers to Discharge MD  Behavior  Nursing      PT Decreased caregiver support not sure if consistent caregiver available  OT Decreased caregiver support, Home environment access/layout, Lack of/limited family support    SLP      SW       Team Discharge Planning: Destination: PT-Home ,OT- Home , SLP-  Projected Follow-up: PT-Home health PT, 24 hour supervision/assistance, OT-  Home health OT, SLP-  Projected Equipment Needs: PT-None recommended by PT, OT- To be determined, SLP-  Equipment Details: PT-states he has walkers, canes at home, OT-  Patient/family involved in discharge planning: PT- Patient,  OT-Patient, SLP-   MD ELOS: 7-10 days Medical Rehab Prognosis:  Excellent Assessment: The patient has been admitted for CIR therapies with the diagnosis of MS exacerbation. The team will be addressing functional mobility, strength, stamina, balance, safety, adaptive techniques and equipment, self-care, bowel and bladder mgt, patient and caregiver education, neuromuscular reeducation, safety awareness, behavioral management, pain management. Goals have been set at modified independent to minimal assistance with basic self-care and ADLs, supervision to contact-guard assistance for transfers and mobility.  Patient with poor safety awareness and having a difficult time adapting to strategies being taught by the team.    Ranelle Oyster, MD, [facility]      See Team  Conference Notes for weekly updates to the plan of care

## 2017-04-20 NOTE — Progress Notes (Addendum)
Social Work  Social Work Assessment and Plan  Patient Details  Name: [name] MRN: [id number] Date of Birth: 1980-06-29  Today's Date: 04/20/2017  Problem List:  Patient Active Problem List   Diagnosis Date Noted  . Tobacco abuse   . Steroid-induced hyperglycemia   . Marijuana abuse, continuous 04/12/2017  . Vitamin D deficiency 04/12/2017  . Multiple sclerosis exacerbation (HCC)   . Abnormality of gait 05/10/2016  . Weakness   . Hypertension   . Depression   . Multiple sclerosis (HCC) 03/02/2011   Past Medical History:  Past Medical History:  Diagnosis Date  . Depression   . Diabetes mellitus without complication (HCC)   . Eye abnormalities    unspecified by pt  . Hypertension   . Multiple sclerosis (HCC)   . Weakness    generalized   Past Surgical History:  Past Surgical History:  Procedure Laterality Date  . WISDOM TOOTH EXTRACTION     Social History:  reports that he has been smoking cigarettes.  He has been smoking about 1.00 pack per day. he has never used smokeless tobacco. He reports that he drinks about 7.2 oz of alcohol per week. He reports that he uses drugs. Drug: Marijuana. Frequency: 30.00 times per week.  Family / Support Systems Marital Status: Married How Long?: but "separated" per pt Patient Roles: Spouse, Parent Spouse/Significant Other: wife, Chanda Busing @ ([phone]) [phone] Children: two children living at home, daughter, Magda Paganini (97) and son, KJ (9) Anticipated Caregiver: On admission to CIR, stated plan was to return home with wife and daughter providing assistance.  Patient now very vague with this SW about his plans.  States he does not plan on wife being his caregiver. Ability/Limitations of Caregiver: Wife works, dtr in school Caregiver Availability: Intermittent Family Dynamics: As noted, patient reports that he and wife are "basically separated".  Patient indicates that he does not intend for wife to be support to him at  discharge.  Social History Preferred language: English Religion: None Cultural Background: NA Education: HS Read: Yes Write: Yes Employment Status: Disabled Date Retired/Disabled/Unemployed: 2012 Legal Hisotry/Current Legal Issues: None Guardian/Conservator: None-per MD, patient is capable of making decisions on his own behalf.   Abuse/Neglect Abuse/Neglect Assessment Can Be Completed: Yes Physical Abuse: Denies Verbal Abuse: Denies Sexual Abuse: Denies Exploitation of patient/patient's resources: Denies Self-Neglect: Denies  Emotional Status Pt's affect, behavior adn adjustment status: Upon entering room, patient appeared very relaxed tilted back in his wheelchair.  Difficult to complete full assessment, however, because patient is extremely tangential.  He shares several opinions about "modern medicine "and his feelings that it "does more harm than good".  Patient denies any significant emotional distress.  Will monitor throughout stay and refer for neuropsychology consult as indicated. Recent Psychosocial Issues: Difficult to get clear picture on family dynamics and prior psychosocial issues for patient. Pyschiatric History: None Substance Abuse History: None, per patient.  Patient / Family Perceptions, Expectations & Goals Pt/Family understanding of illness & functional limitations: Patient appears to have good understanding of his MS flare.  Patient actually acknowledges that he is a safety risk to himself when he is up unattended, however, states he fully intends to continue getting up on his own in the room. Premorbid pt/family roles/activities: Patient describes himself as independent  PTA.  He does report that he has multiple falls daily. Anticipated changes in roles/activities/participation: Little change in roles anticipated as patient has his own opinion about what he should be able to do at  home.  Please note goals are for minimal assistance, however, he does not give  clear answer on who or how he would have this at home nor does he believe that he needs this. Pt/family expectations/goals: Patient states his goal is to "stay here as long as I need to...  You guys know your stuff and I think it is good for me."  [employer] Community Agencies: None Premorbid Home Care/DME Agencies: None Transportation available at discharge: yes Resource referrals recommended: Neuropsychology, Support group (specify)  Discharge Planning Living Arrangements: Spouse/significant other, Children Support Systems: Friends/neighbors Type of Residence: Private residence Insurance Resources: Medicare([employer] Medicare) Surveyor, quantityinancial Resources: [employer] Financial Screen Referred: No Living Expenses: Database administratorMotgage Money Management: Patient Does the patient have any problems obtaining your medications?: No Home Management: Patient and family Patient/Family Preliminary Plans: As noted, patient vague with his plans for discharge.  Indicates he "may go stay with some friends".  States he does not anticipate returning home with his wife and children.  We will continue to follow and discussed discharge plans with him. Expected length of stay: 7-10 days.  Clinical Impression Interesting gentleman here on CIR following an MS exacerbation.  Highly opinionated about what he feels is the best treatment for his MS.  He is resistant to direction from staff about not getting up without assistance.  Very vague about his plans for discharge and how he will get his assistance at home.  Difficult to complete assessment as patient very tangential throughout.  Will follow along to confirm appropriate discharge plans and provide support as needed.  Andrik Sandt 04/20/2017, 10:04 AM

## 2017-04-20 NOTE — Progress Notes (Signed)
Patient continues to be inconsistent with asking for assistance before ambulating. Noted to have asked staff how to go outside early in the shift.

## 2017-04-20 NOTE — Progress Notes (Signed)
Occupational Therapy Session Note  Patient Details  Name: [name] MRN: [id number] Date of Birth: 1981-03-12  Today's Date: 04/20/2017 OT Individual Time: 7342-8768 OT Individual Time Calculation (min): 28 min   Short Term Goals: Week 1:  OT Short Term Goal 1 (Week 1): STGs=LTGs secondary to estimated LOS    Skilled Therapeutic Interventions/Progress Updates:    Tx focus on balance and functional ambulation without device during functional task participation.   Pt greeted supine in bed, requesting to make some coffee. Pt ambulating with Mod A from room down to RN station where coffee table was set up by OT. Had chairs distributed in hallway in case he became fatigued and needed to sit. Several LOBs with OT correction due to ataxia. Pt standing and preparing coffee while engaged with bilateral UEs. He then ambulated back to room, required max cues to slow down and concentrate on quality of movements. Sit<stand from couch with Min A to ambulate to w/c. Pt left seated in w/cwith all needs within reach.   Therapy Documentation Precautions:  Precautions Precautions: Fall Precaution Comments: Pt reports he falls 6 or 7 times a day secondary to foot drag.  Restrictions Weight Bearing Restrictions: No Vital Signs: Therapy Vitals Temp: 98.4 F (36.9 C) Temp Source: Oral Pulse Rate: 68 Resp: 17 BP: 127/66 Patient Position (if appropriate): Lying Oxygen Therapy SpO2: 100 % O2 Device: Not Delivered Pain: Pain Assessment Pain Assessment: No/denies pain ADL:  :    See Function Navigator for Current Functional Status.   Therapy/Group: Individual Therapy  Jaycelyn Orrison A Natara Monfort 04/20/2017, 3:50 PM

## 2017-04-20 NOTE — Progress Notes (Signed)
Physical Therapy Session Note  Patient Details  Name: [name] MRN: [id number] Date of Birth: [birth year]  Today's Date: 04/20/2017 PT Individual Time:Session1: [id number]; Session2: 1420 -1505 PT Individual Time Calculation (min): 85 min & 45 min  Short Term Goals: Week 1:  PT Short Term Goal 1 (Week 1): STG=LTG due to ELOS  Skilled Therapeutic Interventions/Progress Updates:  Therapy Documentation Precautions: agreeable to PT.  Assist to gym in w/c and pt ambulated with no device and minguard to S assist x 75' with increased lateral sway and ataxic quality.  Patient relates not planning to use a device at home and when pinned down finally reports not going back to stay with friends as previous to admission, but likely to stay with his mom in one level home with 3 steps and rail to enter.  Reports has all equipment available to him including walker, cane, wheelchair at her home.  Patient negotiated 4 steps with rails and minguard A noting R LE with some difficulty flexing at times to descend steps, but noting does not buckle, but "ratches" down to allow pt to lower.  Patient practiced lowering to mat on floor and recovery from floor with S when placing L foot under him first pulling, up on mat, then min A with R foot, but with second UE supported on bench able to perform with S.  Practiced multiple times. Toileted during session x 2 with minguard to ambulate into and out of bathroom, dist S for standing toileting.   Patient standing for chess game x 25' with S and min UE support on rolling table.  Patient back to room in w/c and all needs in reach.  Session2: Patient propelled w/c 5250' with S with bilateral UE's, but c/o fatigue so pushed to ortho gym.  Gait up ramp and over mulch and down curb x 2 with min A and multiple LOB due to R hip trendelenberg and cross step with L and grabbing walls to avoid falling.  Discussed shorter steps if able to increase proximal control and decrease fall risk,  but pt not completely agreeable.  Patient on performed car transfer with minguard A.  On mat in tall kneeling for heel sit to tall knee with focus on hip extension; then half kneeling alteranating legs and then on R with overhead reach for R hip activation.  Patient in w/c and pushed to room.  Left in room with all needs in reach, encouraged to call for assist and not get up unaided.    Precautions Precautions: Fall Precaution Comments: Pt reports he falls 6 or 7 times a day secondary to foot drag.  Restrictions Weight Bearing Restrictions: No General:   Pain: Pain Assessment Pain Assessment: No/denies pain   See Function Navigator for Current Functional Status.   Therapy/Group: Individual Therapy  Elray McgregorCynthia Wynn 04/20/2017, 2:47 PM

## 2017-04-20 NOTE — Progress Notes (Signed)
Occupational Therapy Session Note  Patient Details  Name: [name] MRN: [id number] Date of Birth: [birth year]  Today's Date: 04/20/2017 OT Individual Time: 9507-2257 OT Individual Time Calculation (min): 60 min    Short Term Goals: Week 1:  OT Short Term Goal 1 (Week 1): STGs=LTGs secondary to estimated LOS  Skilled Therapeutic Interventions/Progress Updates:    1:1. Upon entering room, pt and RN present trying to administer medication. Pt initially voicing wanting vitamin B shot and to talk to doctor about energy level. OT educates on building endruance in therapy and using energy conservaiton strategies in daily habits. Pt discusses personal history and voices frustration with rules at CIR such as requiring assistance for mobility as MD enters room for rounds. After RN finishes administering medication pt ambulates with RW to bathroom with CGA and 1 LOB Laterally to R with min A to recover. OT educates on standing over toilet with RW to decrease fall risk but pt declines. Pt completes standing activities reaching, pinching clohtes pins, crossing midline and throwing horse shoes while standing on red foam wedge with CGA for balance. Exited session wihtp tseated in w/c with call light in reahc and all need met.  Therapy Documentation Precautions:  Precautions Precautions: Fall Precaution Comments: Pt reports he falls 6 or 7 times a day secondary to foot drag.  Restrictions Weight Bearing Restrictions: No  See Function Navigator for Current Functional Status.   Therapy/Group: Individual Therapy  Tonny Branch 04/20/2017, 9:13 AM

## 2017-04-21 ENCOUNTER — Inpatient Hospital Stay (HOSPITAL_COMMUNITY): Payer: Medicare Other | Admitting: Occupational Therapy

## 2017-04-21 ENCOUNTER — Inpatient Hospital Stay (HOSPITAL_COMMUNITY): Payer: Medicare Other | Admitting: Physical Therapy

## 2017-04-21 DIAGNOSIS — E538 Deficiency of other specified B group vitamins: Secondary | ICD-10-CM

## 2017-04-21 DIAGNOSIS — M792 Neuralgia and neuritis, unspecified: Secondary | ICD-10-CM

## 2017-04-21 DIAGNOSIS — R350 Frequency of micturition: Secondary | ICD-10-CM

## 2017-04-21 DIAGNOSIS — E8809 Other disorders of plasma-protein metabolism, not elsewhere classified: Secondary | ICD-10-CM

## 2017-04-21 DIAGNOSIS — E559 Vitamin D deficiency, unspecified: Secondary | ICD-10-CM

## 2017-04-21 LAB — URINALYSIS, COMPLETE (UACMP) WITH MICROSCOPIC
BILIRUBIN URINE: NEGATIVE
Bacteria, UA: NONE SEEN
GLUCOSE, UA: NEGATIVE mg/dL
Hgb urine dipstick: NEGATIVE
KETONES UR: NEGATIVE mg/dL
LEUKOCYTES UA: NEGATIVE
Nitrite: NEGATIVE
PH: 6 (ref 5.0–8.0)
Protein, ur: NEGATIVE mg/dL
Specific Gravity, Urine: 1.015 (ref 1.005–1.030)

## 2017-04-21 MED ORDER — VITAMIN B-12 1000 MCG PO TABS
500.0000 ug | ORAL_TABLET | Freq: Every day | ORAL | Status: DC
  Administered 2017-04-22 – 2017-04-25 (×4): 500 ug via ORAL
  Filled 2017-04-21 (×4): qty 1

## 2017-04-21 MED ORDER — PRO-STAT SUGAR FREE PO LIQD
30.0000 mL | Freq: Two times a day (BID) | ORAL | Status: DC
  Administered 2017-04-22 – 2017-04-25 (×6): 30 mL via ORAL
  Filled 2017-04-21 (×7): qty 30

## 2017-04-21 MED ORDER — VITAMIN D (ERGOCALCIFEROL) 1.25 MG (50000 UNIT) PO CAPS
50000.0000 [IU] | ORAL_CAPSULE | ORAL | Status: DC
  Administered 2017-04-21: 50000 [IU] via ORAL
  Filled 2017-04-21: qty 1

## 2017-04-21 NOTE — Progress Notes (Addendum)
[facility] PHYSICAL MEDICINE & REHABILITATION     PROGRESS Grant    Subjective/Complaints: Patient seen lying in bed this morning. [name] states [name] slept fairly overnight because of urinary frequency. Later informed by nursing patient would like to go on a grounds pass.  ROS: Denies nausea, vomiting, diarrhea, shortness of breath or chest pain   Objective: Vital Signs: Blood pressure 128/60, pulse (!) 58, temperature 98.3 F (36.8 C), temperature source Oral, resp. rate 16, height 5\' 10"  (1.778 m), weight 80.5 kg (177 lb 6.4 oz), SpO2 100 %. No results found. No results for input(s): WBC, HGB, HCT, PLT in the last 72 hours. No results for input(s): NA, K, CL, GLUCOSE, BUN, CREATININE, CALCIUM in the last 72 hours.  Invalid input(s): CO CBG (last 3)  No results for input(s): GLUCAP in the last 72 hours.  Wt Readings from Last 3 Encounters:  04/17/17 80.5 kg (177 lb 6.4 oz)  04/16/17 78.5 kg (173 lb 1 oz)  07/26/16 79.6 kg (175 lb 8 oz)    Physical Exam:  Constitutional: [name] appears well-developed and well-nourished .  HENT: Normocephalic. Atraumatic Eyes: EOM are normal. No discharge.  Cardiovascular: RRR. No JVD     Respiratory: CTA Bilaterally. Normal effort  Musculoskeletal: [name] exhibits noedema, no tenderness  Neurological: [name] isalertand oriented. Motor: LUE/LLE: 4+/5 prox to distal.  RUE: 4/5. RLE 4-/5 (stable).  Cognitively displays reasonable insight and awareness.  . Skin. Warm and dry Psych: Pleasant and cooperative  Assessment/Plan: 1.  Functional deficits secondary to multiple sclerosis exacerbation which require 3+ hours per day of interdisciplinary therapy in a comprehensive inpatient rehab setting. Physiatrist is providing close team supervision and 24 hour management of active medical problems listed below. Physiatrist and rehab team continue to assess barriers to discharge/monitor patient progress toward functional and medical  goals.  Function:  Bathing Bathing position   Position: Shower  Bathing parts Body parts bathed by patient: Left arm, Chest, Abdomen, Right arm, Front perineal area, Buttocks, Right upper leg, Left upper leg Body parts bathed by helper: Left lower leg, Back, Right lower leg  Bathing assist Assist Level: Touching or steadying assistance(Pt > 75%)      Upper Body Dressing/Undressing Upper body dressing   What is the patient wearing?: Hospital gown                Upper body assist Assist Level: Set up, Supervision or verbal cues      Lower Body Dressing/Undressing Lower body dressing   What is the patient wearing?: Non-skid slipper socks         Non-skid slipper socks- Performed by patient: Don/doff right sock, Don/doff left sock                    Lower body assist        Toileting Toileting Toileting activity did not occur: No continent bowel/bladder event Toileting steps completed by patient: Adjust clothing prior to toileting, Performs perineal hygiene, Adjust clothing after toileting      Toileting assist Assist level: Supervision or verbal cues(patient refuses)   Transfers Chair/bed transfer   Chair/bed transfer method: Ambulatory, Squat pivot Chair/bed transfer assist level: No help, no cues Chair/bed transfer assistive device: Armrests     Locomotion Ambulation     Max distance: 75 Assist level: Touching or steadying assistance (Pt > 75%)   Wheelchair   Type: Manual Max wheelchair distance: 50 Assist Level: Supervision or verbal cues  Cognition Comprehension Comprehension assist level:  Follows basic conversation/direction with no assist  Expression Expression assist level: Expresses basic needs/ideas: With no assist  Social Interaction Social Interaction assist level: Interacts appropriately 75 - 89% of the time - Needs redirection for appropriate language or to initiate interaction.  Problem Solving Problem solving assist level: Solves  basic problems with no assist  Memory Memory assist level: Recognizes or recalls 90% of the time/requires cueing < 10% of the time   Medical Problem List and Plan: 1.  Decreased functional mobility secondary to MS exacerbation. 5 day course IV Solu-Medrol completed   Continue CIR 2.  DVT Prophylaxis/Anticoagulation: Lovenox. Monitor for any bleeding episodes 3. Pain Management: Neurontin 300 mg 3 times a day as needed   Relatively controlled at present 4. Mood: Provide emotional support 5. Neuropsych: This patient is capable of making decisions on his own behalf.   Added amantadine to help with day time energy/arousal.    Vitamin b12 level 366 on 12/12, supplementation initiated   Vitamin D 14.9 on 12/12, supplementation increased to 50,000 units every weekly 6. Skin/Wound Care: Routine skin checks 7. Fluids/Electrolytes/Nutrition: Good oral intake 8. History of tobacco marijuana use. Provide counseling 9. Constipation. Laxative assistance 10. Hypoalbuminemia   Supplement initiated on 12/22 11. Urinary frequency   UA/U culture ordered   Will consider medications if workup negative  LOS (Days) 4 A FACE TO FACE EVALUATION WAS PERFORMED  Henry Cartaya Karis Juba, MD 04/21/2017 10:43 AM

## 2017-04-21 NOTE — Progress Notes (Signed)
Occupational Therapy Session Note  Patient Details  Name: DEANE WATTENBARGER MRN: [id number] Date of Birth: Aug 10, 1980  Today's Date: 04/21/2017 OT Individual Time: 1320-1402 OT Individual Time Calculation (min): 42 min    Short Term Goals: Week 1:  OT Short Term Goal 1 (Week 1): STGs=LTGs secondary to estimated LOS  Skilled Therapeutic Interventions/Progress Updates:    B UE strength/coordination with wc propulsion to therapy gym. Pt needed extended rest break to recover from activity. Discussed energy conservation techniques within home environment and while participating in daily routine. Worked  On standing tolerance within iADL activity of making coffee. Pt ambulated 5 feet w/o AD and min guard A. Able to use B UEs to collect coffee cup and coffee flavor while maintaining balance with close supervision. Pt tolerate 6 mins standing at longest bout. Pt returned to room and left seated in wc with needs met.   Therapy Documentation Precautions:  Precautions Precautions: Fall Precaution Comments: Pt reports he falls 6 or 7 times a day secondary to foot drag.  Restrictions Weight Bearing Restrictions: No  See Function Navigator for Current Functional Status.   Therapy/Group: Individual Therapy  Valma Cava 04/21/2017, 2:03 PM

## 2017-04-21 NOTE — Progress Notes (Signed)
Patient consistently not using call bell this shift having been up out of bed without assistance a minimum of 4 to this point in the shift.

## 2017-04-21 NOTE — Progress Notes (Signed)
Physical Therapy Session Note  Patient Details  Name: [name] MRN: [id number] Date of Birth: [birth year]  Today's Date: 04/21/2017 PT Individual Time: 0915-1000 PT Individual Time Calculation (min): 45 min   Short Term Goals: Week 1:  PT Short Term Goal 1 (Week 1): STG=LTG due to ELOS  Skilled Therapeutic Interventions/Progress Updates:   Pt received sitting EOB and agreeable to PT. Pt transferred to [facility] with min assist from PT. Pt transported to rehab gym. Reciprocal movement training on kinetron 6 x 30sec with cues for attention to task as well as full ROM on BLE. Gait training without AD x22f with min-mod assist from PT. Additional gait training with Rollator and min assist to manage clothing [id number] noted to have improved symmetry of gait, and reduced scissoring with use of rollator. PT educated pt on benefit of use of AD when in MS flare up. Pt very resistant to use any AD. [facility] and all needs met.      Therapy Documentation Precautions:  Precautions Precautions: Fall Precaution Comments: Pt reports he falls 6 or 7 times a day secondary to foot drag.  Restrictions Weight Bearing Restrictions: No Pain: 0/10  See Function Navigator for Current Functional Status.   Therapy/Group: Individual Therapy  AusLorie Phenix/22/2018, 10:00 AM

## 2017-04-21 NOTE — Plan of Care (Signed)
  Progressing RH SAFETY RH STG ADHERE TO SAFETY PRECAUTIONS W/ASSISTANCE/DEVICE Description STG Adhere to Safety Precautions With mod I Assistance.  04/21/2017 2240 - Progressing by Sissy HoffBarber, Luella Gardenhire M, RN RH STG DECREASED RISK OF FALL WITH ASSISTANCE Description STG Decreased Risk of Fall With mod I Assistance.  04/21/2017 2240 - Progressing by Sissy HoffBarber, Marlita Keil M, RN

## 2017-04-21 NOTE — Progress Notes (Addendum)
Occupational Therapy Session Note  Patient Details  Name: [name] MRN: [id number] Date of Birth: 1980-09-14  Today's Date: 04/21/2017 OT Individual Time: 5038-8828 and 0034-9179 OT Individual Time Calculation (min): 59 min and 43 min   Short Term Goals: Week 1:  OT Short Term Goal 1 (Week 1): STGs=LTGs secondary to estimated LOS  Skilled Therapeutic Interventions/Progress Updates:    Tx focus on balance, functional transfers, and activity tolerance during meaningful activities.   Pt greeted in w/c. Declining participation in self care tasks. He self propelled w/c to gym with extra time and encouragement. Pt engaging in standing activities involving wii. Worked on balance without UE support while he was engaged in [employer] shooting task. He required multiple rest breaks due to fatigue. Encouraged him to engage in additional standing task with pt verbalizing that he needed to urinate. Pt escorted to therapy apartment bathroom. He ambulated short distance with Min A, cues provided for w/c safety and for minimizing furniture walking. Pt urinating while standing and then side stepped over to sink to wash hands. He ambulated back to w/c, and required additional rest break. When he was ready he self propelled back to room. Pt left with all needs at time of departure.   Throughout session, pt with tangential tendencies and self limiting behaviors. Continued education with pt regarding his rehab potential to increase volition and feelings of self efficacy.   2nd Session 1:1 tx (43 min) Tx focus on activity tolerance, UE strength/coordination, and active therapy participation during leisure occupational engagement.   Pt greeted in w/c, reported that he was very fatigued from previous therapies and initially declining all therapeutic activities. Pt eventually agreeable with persistence and encouragement, though declined standing activities. Therefore worked on UE strength/coordination with pt playing  baseball in therapy apartment (after he self propelled there). Pt improving accuracy with repetition and intrinsic feedback while using baseball bat. He required mod cues for redirection to task due to tangential tendencies. Afterwards pt self propelled back to room and was left with all needs within reach.   Therapy Documentation Precautions:  Precautions Precautions: Fall Precaution Comments: Pt reports he falls 6 or 7 times a day secondary to foot drag.  Restrictions Weight Bearing Restrictions: No Pain: No c/o pain during tx   ADL:      See Function Navigator for Current Functional Status.  Therapy/Group: Individual Therapy  Kailash Hinze A Cambell Stanek 04/21/2017, 12:45 PM

## 2017-04-21 NOTE — Progress Notes (Signed)
Patient refuses to have supervision to bathroom. Patient was up walking without supervision. Educated patient on fall risk precautions and policies and procedures of unit. Patient stated "I really don't care about ya'lls policies and procedures". Will continue to monitor patient.

## 2017-04-22 DIAGNOSIS — M62838 Other muscle spasm: Secondary | ICD-10-CM

## 2017-04-22 LAB — URINE CULTURE: Culture: NO GROWTH

## 2017-04-22 MED ORDER — BACLOFEN 10 MG PO TABS
10.0000 mg | ORAL_TABLET | Freq: Three times a day (TID) | ORAL | Status: DC
  Administered 2017-04-22 – 2017-04-23 (×3): 10 mg via ORAL
  Filled 2017-04-22 (×4): qty 1

## 2017-04-22 NOTE — Progress Notes (Signed)
[place] PHYSICAL MEDICINE & REHABILITATION     PROGRESS NOTE    Subjective/Complaints: Pt seen laying in bed this AM.  He slept well overnight. He notes improvement in bladder function. He notes a pressure sensation along his lateral neck bilaterally.  ROS: + Neck pressure. Denies nausea, vomiting, diarrhea, shortness of breath or chest pain   Objective: Vital Signs: Blood pressure 115/89, pulse 90, temperature 98.3 F (36.8 C), temperature source Oral, resp. rate 18, height 5\' 10"  (1.778 m), weight 80.5 kg (177 lb 6.4 oz), SpO2 97 %. No results found. No results for input(s): WBC, HGB, HCT, PLT in the last 72 hours. No results for input(s): NA, K, CL, GLUCOSE, BUN, CREATININE, CALCIUM in the last 72 hours.  Invalid input(s): CO CBG (last 3)  No results for input(s): GLUCAP in the last 72 hours.  Wt Readings from Last 3 Encounters:  04/17/17 80.5 kg (177 lb 6.4 oz)  04/16/17 78.5 kg (173 lb 1 oz)  07/26/16 79.6 kg (175 lb 8 oz)    Physical Exam:  Constitutional: He appears well-developed and well-nourished .  HENT: Normocephalic. Atraumatic Eyes: EOM are normal. No discharge.  Cardiovascular: RRR. No JVD     Respiratory: CTA Bilaterally. Normal effort  Musculoskeletal: He exhibits noedema, no tenderness  Neurological: He isalertand oriented. Motor: LUE/LLE: 4+/5 prox to distal.  RUE: 4/5. RLE 4-/5 (unchanged).  Cognitively displays reasonable insight and awareness.  . Skin. Warm and dry Psych: Pleasant and cooperative  Assessment/Plan: 1.  Functional deficits secondary to multiple sclerosis exacerbation which require 3+ hours per day of interdisciplinary therapy in a comprehensive inpatient rehab setting. Physiatrist is providing close team supervision and 24 hour management of active medical problems listed below. Physiatrist and rehab team continue to assess barriers to discharge/monitor patient progress toward functional and medical  goals.  Function:  Bathing Bathing position   Position: Shower  Bathing parts Body parts bathed by patient: Left arm, Chest, Abdomen, Right arm, Front perineal area, Buttocks, Right upper leg, Left upper leg Body parts bathed by helper: Left lower leg, Back, Right lower leg  Bathing assist Assist Level: Touching or steadying assistance(Pt > 75%)      Upper Body Dressing/Undressing Upper body dressing   What is the patient wearing?: Hospital gown                Upper body assist Assist Level: Set up, Supervision or verbal cues      Lower Body Dressing/Undressing Lower body dressing   What is the patient wearing?: Non-skid slipper socks         Non-skid slipper socks- Performed by patient: Don/doff right sock, Don/doff left sock                    Lower body assist        Toileting Toileting Toileting activity did not occur: No continent bowel/bladder event Toileting steps completed by patient: Adjust clothing prior to toileting, Performs perineal hygiene, Adjust clothing after toileting      Toileting assist Assist level: Supervision or verbal cues(patient refuses)   Transfers Chair/bed transfer   Chair/bed transfer method: Stand pivot Chair/bed transfer assist level: Touching or steadying assistance (Pt > 75%) Chair/bed transfer assistive device: Armrests     Locomotion Ambulation     Max distance: 146ft Assist level: Touching or steadying assistance (Pt > 75%)   Wheelchair   Type: Manual Max wheelchair distance: 50 Assist Level: Supervision or verbal cues  Cognition Comprehension Comprehension  assist level: Follows basic conversation/direction with no assist  Expression Expression assist level: Expresses basic needs/ideas: With no assist  Social Interaction Social Interaction assist level: Interacts appropriately with others - No medications needed.  Problem Solving Problem solving assist level: Solves basic problems with no assist  Memory  Memory assist level: Recognizes or recalls 90% of the time/requires cueing < 10% of the time   Medical Problem List and Plan: 1.  Decreased functional mobility secondary to MS exacerbation. 5 day course IV Solu-Medrol completed   Continue CIR 2.  DVT Prophylaxis/Anticoagulation: Lovenox. Monitor for any bleeding episodes 3. Pain Management: Neurontin 300 mg 3 times a day as needed   Relatively controlled at present   Baclofen 10 3 times a day scheduled on 12/23 for possible muscle spasms and neck 4. Mood: Provide emotional support 5. Neuropsych: This patient is capable of making decisions on his own behalf.   Added amantadine to help with day time energy/arousal.    Vitamin b12 level 366 on 12/12, supplementation initiated   Vitamin D 14.9 on 12/12, supplementation increased to 50,000 units every weekly 6. Skin/Wound Care: Routine skin checks 7. Fluids/Electrolytes/Nutrition: Good oral intake 8. History of tobacco marijuana use. Provide counseling 9. Constipation. Laxative assistance 10. Hypoalbuminemia   Supplement initiated on 12/22 11. Urinary frequency   UA unremarkable, urine culture pending   Improving  LOS (Days) 5 A FACE TO FACE EVALUATION WAS PERFORMED  Ankit Karis JubaAnil Patel, MD 04/22/2017 9:08 AM

## 2017-04-22 NOTE — Plan of Care (Signed)
  Not Progressing RH SAFETY RH STG ADHERE TO SAFETY PRECAUTIONS W/ASSISTANCE/DEVICE Description STG Adhere to Safety Precautions With mod I Assistance.  04/22/2017 2311 - Not Progressing by Sissy HoffBarber, Lucy Boardman M, RN Note Patient is noncompliant with safety precautions.  Patient states " I don't care about your rules and your precautions" RH STG DECREASED RISK OF FALL WITH ASSISTANCE Description STG Decreased Risk of Fall With mod I Assistance.  04/22/2017 2311 - Not Progressing by Sissy HoffBarber, Ermie Glendenning M, RN

## 2017-04-23 ENCOUNTER — Inpatient Hospital Stay (HOSPITAL_COMMUNITY): Payer: Medicare Other

## 2017-04-23 ENCOUNTER — Inpatient Hospital Stay (HOSPITAL_COMMUNITY): Payer: Medicare Other | Admitting: Occupational Therapy

## 2017-04-23 ENCOUNTER — Ambulatory Visit (HOSPITAL_COMMUNITY): Payer: Medicare Other

## 2017-04-23 DIAGNOSIS — R208 Other disturbances of skin sensation: Secondary | ICD-10-CM

## 2017-04-23 MED ORDER — BACLOFEN 20 MG PO TABS
30.0000 mg | ORAL_TABLET | Freq: Three times a day (TID) | ORAL | Status: DC
  Administered 2017-04-23 – 2017-04-24 (×3): 30 mg via ORAL
  Filled 2017-04-23 (×3): qty 1

## 2017-04-23 MED ORDER — GABAPENTIN 600 MG PO TABS
300.0000 mg | ORAL_TABLET | Freq: Three times a day (TID) | ORAL | Status: DC
  Administered 2017-04-23 – 2017-04-24 (×3): 300 mg via ORAL
  Filled 2017-04-23 (×3): qty 1

## 2017-04-23 NOTE — Plan of Care (Signed)
7/7 LTGs achieved 12/24

## 2017-04-23 NOTE — Progress Notes (Signed)
Social Work Patient ID: [name], male   DOB: 1981/01/30, 36 y.o.   MRN: [id number]   Met with pt following team conference and have informed him that team feels he is ready to d/c by Wednesday.  He is in agreement but still uncertain as to where he will go.  Have stressed to pt that team recommends 24/7 supervision for safety and if he does not have this, then he is assuming risk of injury.  Pt rambles about taking his children out of school in order to have 62 yo daughter provide the supervision vs going home with his mother.  Explained that I must have a d/c location confirmed in order to arrange [facility] therapy follow up.  Will ask covering SW to check with pt on Wednesday morning to confirm d/c location.  Henry Roza, LCSW

## 2017-04-23 NOTE — Patient Care Conference (Signed)
Inpatient RehabilitationTeam Conference and Plan of Care Update Date: 04/23/2017   Time: 10:35 AM    Patient Name: [name]      Medical Record Number: [id number]  Date of Birth: June 07, 1980 Sex: Male         Room/Bed: 4W11C/4W11C-01 Payor Info: Payor: [employer] MEDICARE / Plan: [employer] MEDICARE HMO / Product Type: *No Product type* /    Admitting Diagnosis: MS  Admit Date/Time:  04/17/2017  4:08 PM Admission Comments: No comment available   Primary Diagnosis:  <principal problem not specified> Principal Problem: <principal problem not specified>  Patient Active Problem List   Diagnosis Date Noted  . Dysesthesia   . Muscle spasms of neck   . Neuropathic pain   . Vitamin B12 deficiency   . Hypoalbuminemia   . Urinary frequency   . Tobacco abuse   . Steroid-induced hyperglycemia   . Marijuana abuse, continuous 04/12/2017  . Vitamin D deficiency 04/12/2017  . Multiple sclerosis exacerbation (HCC)   . Abnormality of gait 05/10/2016  . Weakness   . Hypertension   . Depression   . Multiple sclerosis (HCC) 03/02/2011    Expected Discharge Date: Expected Discharge Date: 04/25/17  Team Members Present: Physician leading conference: Dr. Maryla Morrow Social Worker Present: Amada Jupiter, LCSW Nurse Present: Kennon Portela, RN PT Present: Karolee Stamps, PT OT Present: Other (comment)(Michaela Mikey Bussing, OT) SLP Present: Feliberto Gottron, SLP PPS Coordinator present : Tora Duck, RN, CRRN     Current Status/Progress Goal Weekly Team Focus  Medical   Decreased functional mobility secondary to MS exacerbation.   Improve mobility, safety, neurogenic bladder, complaince  See above   Bowel/Bladder   Patient is continent of Bowel and Bladder  Patient to remain continent of bowel and bladder  Continue to void using urinal and remain contient of B/B   Swallow/Nutrition/ Hydration             ADL's   Mod A bathing at shower level, has not yet completed UB/LB dressing in OT. Can don gripper  socks himself. Min A stand pivot/ambulatory bathroom transfers. Very self limiting and resistant to OT education.  Mod I UB dressing + grooming tasks, Min A LB dressing, supervision bathing, Min A toileting + toilet transfer  Pt education, safety awareness, balance, functional transfers, ADL retrianing, d/c planning   Mobility   min assist overall; self limiting and resistant to education  min assist gait; supervision transfers  d/c planning, education on safety as able, balance training, endurance,   Communication             Safety/Cognition/ Behavioral Observations  Patient noncompliant with safety plan  Patient will call for assistance to get up out of bed   Remind patient to call staff for assistance and importance of safety measures   Pain   Patient denies pain/discomfort  Patient will remain pain free during stay  Assess patient for pain q shift and PRN.    Skin   Skin without breakdown/wounds  Skin will remain intact during stay on unit  Continue to check skin q shift and PRN for injury    Rehab Goals Patient on target to meet rehab goals: Yes *See Care Plan and progress notes for long and short-term goals.     Barriers to Discharge  Current Status/Progress Possible Resolutions Date Resolved   Physician    Medical stability;Decreased caregiver support;Lack of/limited family support     See above  Therapies, optimize bladder meds - needs to be complaint  Nursing                  PT                    OT Decreased caregiver support;Home environment access/layout;Lack of/limited family support;Behavior                SLP                SW                Discharge Planning/Teaching Needs:  Pt continues to be vague with information about where he plans to d/c.    Teaching needs TBD   Team Discussion:  Therapies feel pt is at baseline of supervision and ready for d/c this week.  Pt is resistant to any education on safety.  Recommend 24/7 supervision, however, SW  reports she cannot get pt to commit to this d/c plan or a location.  Revisions to Treatment Plan:  None    Continued Need for Acute Rehabilitation Level of Care: The patient requires daily medical management by a physician with specialized training in physical medicine and rehabilitation for the following conditions: Daily direction of a multidisciplinary physical rehabilitation program to ensure safe treatment while eliciting the highest outcome that is of practical value to the patient.: Yes Daily medical management of patient stability for increased activity during participation in an intensive rehabilitation regime.: Yes Daily analysis of laboratory values and/or radiology reports with any subsequent need for medication adjustment of medical intervention for : Neurological problems;Urological problems  [name] 04/23/2017, 11:23 AM

## 2017-04-23 NOTE — Progress Notes (Signed)
Physical Therapy Session Note  Patient Details  Name: [name] MRN: [id number] Date of Birth: Aug 09, 1980  Today's Date: 04/23/2017 PT Group Time: 1425-1525 PT Group Time Calculation (min): 60 min  Short Term Goals: Week 1:  PT Short Term Goal 1 (Week 1): STG=LTG due to ELOS  Skilled Therapeutic Interventions/Progress Updates:    Pt participated in walking and balance group with focus on functional gait to simulate home environment without AD navigating lateral sidestepping to address balance, gait, endurance, and safety in a social setting. Pt requires supervision to min assistance throughout session due to impulsivity and declining use of AD to aid with balance. Education provided on energy conservation and overall fall risk due to decreased balance. Pt engaged in dynamic standing balance activity using Wii Bowling with overall supervision for safety to address balance and standing tolerance. Rest breaks as needed. Instructed in seated heel raises/toe raises, LAQ and marches for strengthening. Administered TUG without AD with average of 11.67 sec and min assist for balance. NMR on compliant surface with eyes open and eyes closed for balance reaction training. End of session propelled himself mod I back towards RN station.    Therapy Documentation Precautions:  Precautions Precautions: Fall Precaution Comments: Pt reports he falls 6 or 7 times a day secondary to foot drag.  Restrictions Weight Bearing Restrictions: No    Pain:  No complaints.    See Function Navigator for Current Functional Status.   Therapy/Group: Group Therapy  Karolee Stamps Darrol Poke, PT, DPT  04/23/2017, 3:38 PM

## 2017-04-23 NOTE — Progress Notes (Signed)
Physical Therapy Discharge Summary  Patient Details  Name: [name] MRN: [id number] Date of Birth: 30-Jan-1981  Patient has met 7 of 9 long term goals due to improved activity tolerance, improved balance, ability to compensate for deficits and functional use of  right lower extremity and left lower extremity.  Patient to discharge at an ambulatory level Supervision.   Patient's care partner unavailable . Reasons goals not met: stairs- weakness bil LEs; community ambulation- limited endurance for distances Recommendation:  Patient will benefit from ongoing skilled PT services in home health setting to continue to advance safe functional mobility, address ongoing impairments in activty tolerance, strength, balance, spasticiity, and minimize fall risk.  Equipment: No equipment provided  Reasons for discharge:most  treatment goals met and discharge from hospital  Patient/family agrees with progress made and goals achieved: Yes  PT Discharge Precautions/Restrictions- falls; hx of many falls in home, but refuses to use AD   Vital Signs Therapy Vitals Temp: 99.7 F (37.6 C) Temp Source: Oral Pulse Rate: 91 Resp: 18 BP: (!) 120/99 Patient Position (if appropriate): Sitting Oxygen Therapy SpO2: 99 % O2 Device: Not Delivered Pain- none   Vision/Perception - wears glass, no change since baseline    Cognition A nd O x 4; rambling and disjointed conversation   Sensation Sensation Light Touch: Appears Intact Hot/Cold: Appears Intact Additional Comments: describes parasthesias with cervical flexion Coordination Gross Motor Movements are Fluid and Coordinated: No Fine Motor Movements are Fluid and Coordinated: No Coordination and Movement Description: ataxic gait Heel Shin Test: decreased coordination bilateral R worse than L Motor  Motor Motor: Ataxia;Abnormal tone Motor - Skilled Clinical Observations: R LE syngergistic movements, ataxic gait Motor - Discharge Observations:  R LE syngergistic movements, ataxic gait  Mobility Bed Mobility Bed Mobility: Rolling Right;Rolling Left;Supine to Sit;Sit to Supine;Scooting to [facility] Rolling Right: 6: Modified independent (Device/Increase time) Supine to Sit: 6: Modified independent (Device/Increase time) Sit to Supine: 6: Modified independent (Device/Increase time) Scooting to [facility]: 6: Modified independent (Device/Increase time) Transfers Transfers: Yes Sit to Stand: 6: Modified independent (Device/Increase time) Stand Pivot Transfers: 6: Modified independent (Device/Increase time) Squat Pivot Transfers: 6: Modified independent (Device/Increase time) Locomotion  Ambulation Ambulation: Yes Ambulation/Gait Assistance: 5: Supervision Ambulation Distance (Feet): 160 Feet Assistive device: Other (Comment)(w/c as AD) Ambulation/Gait Assistance Details: RLE extension synergy, narrow BOS Gait Gait: Yes Gait Pattern: Impaired Gait Pattern: Decreased hip/knee flexion - right;Right hip hike;Ataxic;Decreased dorsiflexion - right High Level Ambulation High Level Ambulation: Side stepping Side Stepping: S Stairs / Additional Locomotion Stairs: Yes(12/21) Stairs Assistance: 4: Min assist Stair Management Technique: Two rails Number of Stairs: 4 Height of Stairs: 6 Wheelchair Mobility Wheelchair Mobility: Yes Wheelchair Assistance: 5: Careers information officer: Both upper extremities Distance: 150  Trunk/Postural Assessment  Cervical Assessment Cervical Assessment: Within Functional Limits Thoracic Assessment Thoracic Assessment: Within Functional Limits Lumbar Assessment Lumbar Assessment: Within Functional Limits Postural Control Postural Control: Deficits on evaluation  Balance Patient demonstrates increased fall risk as noted by score on 12/21 of  25 /56 on Berg Balance Scale.  (<36= high risk for falls, close to 100%; 37-45 significant >80%; 46-51 moderate >50%; 52-55 lower >25%)    Extremity  Assessment not tested at d/c.  [place] for limited ambulation distances         See Function Navigator for Current Functional Status.  Tahtiana Rozier 04/23/2017, 5:43 PM

## 2017-04-23 NOTE — Progress Notes (Addendum)
Physical Therapy Note  Patient Details  Name: [name] MRN: [id number] Date of Birth: May 30, 1980 Today's Date: 04/23/2017  0900-1000, 60 min individual tx Pain: none per pt  Pt donned shoes while sitting in w/c, with 1 cue to lock brakes for safety.  W/c propulsion over level tile with supervision.  neuromuscular re-education via forced use for alternating reciprocal movement x bil UEs and bil LEs, at level 4 x 3 minutes, at level 6 x 3 minutes.  Gait training with grocery cart on level tile x 100' with RLE circumduction noted as he fatigued.  Pt stated he always uses a cart and sometimes uses a power cart at the store.  Pt resistant to use of a RW or rollator, even during MS exacerbation.  Pt has poor insight and reasoning regarding his disease.  Floor transfer without cues, safely.   Neuromuscular re-education for isolated muscle activation and eccentric control via multimodal cues for active assistieve R/L hip abduction with clam shell movement in L/R side lying, bil bridging squeezing ball between knees in supine.    Gait training on level tile to return to room; PT suggested gait without AD, and PT to roll w/c along behind pt.  As pt initiated ambulation, he immediately used w/c as AD, x 160'.  Pt left resting in w/c with all needs within reach.  Pt declined quick release belt; discussed falls risk; Misty Stanley RN informed.    Nikkie Liming 04/23/2017, 7:57 AM

## 2017-04-23 NOTE — Progress Notes (Signed)
Occupational Therapy Discharge Summary  Patient Details  Name: [name] MRN: [id number] Date of Birth: 11/28/1980   Today's Date: 04/23/2017 OT Individual Time: 1100-1156 OT Individual Time Calculation (min): 56 min   Patient has met 7 of 7 long term goals due to improved activity tolerance, improved balance, postural control and improved coordination.  Patient to discharge at overall Supervision level.  Per pt, his mother/aunt can provide the necessary physical/cognitive assistance at discharge.    All goals met.   Recommendation:  Patient will benefit from ongoing skilled OT services in home health setting to continue to advance functional skills in the area of iADL, Vocation and Reduce care partner burden.  Equipment: No equipment provided  Reasons for discharge: treatment goals met and discharge from hospital  Patient/family agrees with progress made and goals achieved: Yes   Skilled Therapeutic Intervention:  Skilled Therapeutic Interventions/Progress Updates:    Tx focus on d/c planning, balance, functional transfers, and pt education.   Pt greeted in recliner. Declining bathing/dressing participation. Initiated d/c planning, including where he would be staying. Pt verbalizing best place to stay would be at mother's home. His aunt assists her during the day, and they could provide 24/7 supervision. IADLs would be managed by aunt. When SW arrived and educated him regarding d/c this week, pt verbalizing he had no place to stay and that he would not have 24/7 supervision at his mother's home. Pt explaining his only d/c option was to pull children out of school so they would supervise. Once SW left, pt reporting he could stay at mother's again. He self propelled to tub shower room and pt practiced transfers to TTB with close supervision. Per pt, she has TTB at home, bathroom is not w/c accessible, but her home is. Educated him to bathe while seated to reduce fall risk, and also to  keep w/c very close to areas he planned to transfer to. Per pt, he required no AE/DME for home as mother already owned them. He also reports he's been completing self care on his own in the room and does not need AE to increase independence. He then self propelled back towards room. After preparing coffee while standing with close supervision, he self propelled back to room. Toilet transfer/toileting completed with close supervision at ambulatory level. Pt left with all needs within reach.   OT Discharge Precautions/Restrictions  Precautions Precautions: Fall Precaution Comments: Pt reports he falls 6 or 7 times a day secondary to foot drag.  Restrictions Weight Bearing Restrictions: No Pain No c/o pain during tx    ADL ADL ADL Comments: Please see functional navigator for ADL status Vision Patient Visual Report: No change from baseline Cognition Overall Cognitive Status: No family/caregiver present to determine baseline cognitive functioning Arousal/Alertness: Awake/alert Orientation Level: Oriented X4 Awareness: Impaired Problem Solving: Impaired Safety/Judgment: Impaired Sensation Sensation Light Touch: Appears Intact Hot/Cold: Appears Intact Coordination Gross Motor Movements are Fluid and Coordinated: No Fine Motor Movements are Fluid and Coordinated: No Coordination and Movement Description: Ataxic Motor  Motor Motor: Ataxia;Abnormal tone Mobility  Transfers Transfers: Sit to Stand;Stand to Sit Sit to Stand: 5: Supervision;From toilet Stand to Sit: To toilet;5: Supervision  Trunk/Postural Assessment  Cervical Assessment Cervical Assessment: Within Functional Limits Thoracic Assessment Thoracic Assessment: Within Functional Limits Lumbar Assessment Lumbar Assessment: Within Functional Limits Postural Control Postural Control: Deficits on evaluation  Balance Balance Balance Assessed: Yes Dynamic Standing Balance Dynamic Standing - Level of Assistance: 5: Stand  by assistance(Toileting) Extremity/Trunk Assessment RUE Assessment  RUE Assessment: Within Functional Limits LUE Assessment LUE Assessment: Within Functional Limits   See Function Navigator for Current Functional Status.  [name] 04/23/2017, 8:33 PM

## 2017-04-23 NOTE — Progress Notes (Signed)
[place] PHYSICAL MEDICINE & REHABILITATION     PROGRESS NOTE    Subjective/Complaints: Pt seen ambulating in room this AM.  When educated on safety, he states, "I'm a grown man and have been doing this for over a year". She continues to have neck dysesthesias.   ROS: + Neck dysesthesias. Denies nausea, vomiting, diarrhea, shortness of breath or chest pain   Objective: Vital Signs: Blood pressure 140/74, pulse 73, temperature 98 F (36.7 C), temperature source Oral, resp. rate 18, height 5\' 10"  (1.778 m), weight 80.5 kg (177 lb 6.4 oz), SpO2 100 %. No results found. No results for input(s): WBC, HGB, HCT, PLT in the last 72 hours. No results for input(s): NA, K, CL, GLUCOSE, BUN, CREATININE, CALCIUM in the last 72 hours.  Invalid input(s): CO CBG (last 3)  No results for input(s): GLUCAP in the last 72 hours.  Wt Readings from Last 3 Encounters:  04/17/17 80.5 kg (177 lb 6.4 oz)  04/16/17 78.5 kg (173 lb 1 oz)  07/26/16 79.6 kg (175 lb 8 oz)    Physical Exam:  Constitutional: He appears well-developed and well-nourished .  HENT: Normocephalic. Atraumatic Eyes: EOM are normal. No discharge.  Cardiovascular: RRR. No JVD     Respiratory: CTA Bilaterally. Normal effort  Musculoskeletal: He exhibits noedema, no tenderness  Neurological: He isalertand oriented. Motor: LUE/LLE: 4+/5 prox to distal.  RUE: 4-4+/5. RLE 4/5 .  Cognitively displays reasonable insight and awareness.  . Skin. Warm and dry Psych: Pleasant and cooperative  Assessment/Plan: 1.  Functional deficits secondary to multiple sclerosis exacerbation which require 3+ hours per day of interdisciplinary therapy in a comprehensive inpatient rehab setting. Physiatrist is providing close team supervision and 24 hour management of active medical problems listed below. Physiatrist and rehab team continue to assess barriers to discharge/monitor patient progress toward functional and medical  goals.  Function:  Bathing Bathing position   Position: Shower  Bathing parts Body parts bathed by patient: Left arm, Chest, Abdomen, Right arm, Front perineal area, Buttocks, Right upper leg, Left upper leg Body parts bathed by helper: Left lower leg, Back, Right lower leg  Bathing assist Assist Level: Touching or steadying assistance(Pt > 75%)      Upper Body Dressing/Undressing Upper body dressing   What is the patient wearing?: Hospital gown                Upper body assist Assist Level: Set up, Supervision or verbal cues      Lower Body Dressing/Undressing Lower body dressing   What is the patient wearing?: Non-skid slipper socks         Non-skid slipper socks- Performed by patient: Don/doff right sock, Don/doff left sock                    Lower body assist        Toileting Toileting Toileting activity did not occur: No continent bowel/bladder event Toileting steps completed by patient: Adjust clothing prior to toileting, Performs perineal hygiene, Adjust clothing after toileting      Toileting assist Assist level: No help/no cues   Transfers Chair/bed transfer   Chair/bed transfer method: Stand pivot Chair/bed transfer assist level: Touching or steadying assistance (Pt > 75%) Chair/bed transfer assistive device: Armrests     Locomotion Ambulation     Max distance: 176ft Assist level: Touching or steadying assistance (Pt > 75%)   Wheelchair   Type: Manual Max wheelchair distance: 50 Assist Level: Supervision or verbal cues  Cognition Comprehension Comprehension assist level: Follows basic conversation/direction with no assist  Expression Expression assist level: Expresses basic needs/ideas: With no assist  Social Interaction Social Interaction assist level: Interacts appropriately with others - No medications needed.  Problem Solving Problem solving assist level: Solves basic problems with no assist  Memory Memory assist level:  Recognizes or recalls 90% of the time/requires cueing < 10% of the time   Medical Problem List and Plan: 1.  Decreased functional mobility secondary to MS exacerbation. 5 day course IV Solu-Medrol completed   Continue CIR 2.  DVT Prophylaxis/Anticoagulation: Lovenox. Monitor for any bleeding episodes 3. Pain Management:    Neurontin 300 mg 3 times a day scheduled   Baclofen 10 3 times a day scheduled on 12/23, increased to 30 on 12/24 4. Mood: Provide emotional support 5. Neuropsych: This patient is capable of making decisions on his own behalf.   Added amantadine to help with day time energy/arousal.    Vitamin b12 level 366 on 12/12, supplementation initiated   Vitamin D 14.9 on 12/12, supplementation increased to 50,000 units every weekly 6. Skin/Wound Care: Routine skin checks 7. Fluids/Electrolytes/Nutrition: Good oral intake 8. History of tobacco marijuana use. Provide counseling 9. Constipation. Laxative assistance 10. Hypoalbuminemia   Supplement initiated on 12/22 11. Urinary frequency   UA unremarkable, urine culture NG   Improving  LOS (Days) 6 A FACE TO FACE EVALUATION WAS PERFORMED  Ankit Karis JubaAnil Patel, MD 04/23/2017 10:23 AM

## 2017-04-24 LAB — COMPREHENSIVE METABOLIC PANEL
ALBUMIN: 3.5 g/dL (ref 3.5–5.0)
ALT: 31 U/L (ref 17–63)
AST: 21 U/L (ref 15–41)
Alkaline Phosphatase: 56 U/L (ref 38–126)
Anion gap: 8 (ref 5–15)
BILIRUBIN TOTAL: 0.4 mg/dL (ref 0.3–1.2)
BUN: 17 mg/dL (ref 6–20)
CO2: 24 mmol/L (ref 22–32)
CREATININE: 1.03 mg/dL (ref 0.61–1.24)
Calcium: 9.2 mg/dL (ref 8.9–10.3)
Chloride: 106 mmol/L (ref 101–111)
GFR calc Af Amer: >60 mL/min (ref >60)
GLUCOSE: 118 mg/dL — AB (ref 65–99)
POTASSIUM: 4.5 mmol/L (ref 3.5–5.1)
Sodium: 138 mmol/L (ref 135–145)
TOTAL PROTEIN: 6.4 g/dL — AB (ref 6.5–8.1)

## 2017-04-24 LAB — CBC
HEMATOCRIT: 39.3 % (ref 39.0–52.0)
Hemoglobin: 13 g/dL (ref 13.0–17.0)
MCH: 29 pg (ref 26.0–34.0)
MCHC: 33.1 g/dL (ref 30.0–36.0)
MCV: 87.5 fL (ref 78.0–100.0)
PLATELETS: 219 10*3/uL (ref 150–400)
RBC: 4.49 MIL/uL (ref 4.22–5.81)
RDW: 13.3 % (ref 11.5–15.5)
WBC: 4.2 10*3/uL (ref 4.0–10.5)

## 2017-04-24 LAB — CREATININE, SERUM
Creatinine, Ser: 0.97 mg/dL (ref 0.61–1.24)
GFR calc Af Amer: >60 mL/min (ref >60)

## 2017-04-24 MED ORDER — METHOCARBAMOL 500 MG PO TABS
500.0000 mg | ORAL_TABLET | Freq: Three times a day (TID) | ORAL | Status: DC | PRN
Start: 2017-04-24 — End: 2017-04-25

## 2017-04-24 MED ORDER — GABAPENTIN 600 MG PO TABS
300.0000 mg | ORAL_TABLET | Freq: Three times a day (TID) | ORAL | Status: DC | PRN
  Administered 2017-04-25: 300 mg via ORAL
  Filled 2017-04-24: qty 1

## 2017-04-24 NOTE — Progress Notes (Signed)
Patient extremely anxious with verbal cussing conversation regarding his upcoming discharge on 04/25/17. Patient feel that his condition has not improved in the last 24 hrs because he is starting to experience increase symptoms of dizziness, not feeling well,and unsteadiness with ambulation. Continue to ambulate without staff assistance, after multiple conversation with patient regarding safety and falls., Encourage to speak with medical tam regarding is current concerns. Bed alarm and safety measures in place, monitor closely

## 2017-04-24 NOTE — Discharge Summary (Signed)
Discharge summary job # [id number]

## 2017-04-24 NOTE — Progress Notes (Signed)
Called PA, round of Solu Medrol just completed, too soon to start another round, will request  CMET, CBC, and run scan for PVR

## 2017-04-24 NOTE — Progress Notes (Signed)
[place] PHYSICAL MEDICINE & REHABILITATION     PROGRESS NOTE    Subjective/Complaints: Pt seen laying in bed this AM.  He slept well overnight.  He notes he had some unsteadiness in gait this AM.    ROS: Denies nausea, vomiting, diarrhea, shortness of breath or chest pain   Objective: Vital Signs: Blood pressure 129/68, pulse 73, temperature 98 F (36.7 C), temperature source Oral, resp. rate 12, height 5\' 10"  (1.778 m), weight 80.5 kg (177 lb 6.4 oz), SpO2 100 %. No results found. No results for input(s): WBC, HGB, HCT, PLT in the last 72 hours. Recent Labs    04/24/17 0616  CREATININE 0.97   CBG (last 3)  No results for input(s): GLUCAP in the last 72 hours.  Wt Readings from Last 3 Encounters:  04/17/17 80.5 kg (177 lb 6.4 oz)  04/16/17 78.5 kg (173 lb 1 oz)  07/26/16 79.6 kg (175 lb 8 oz)    Physical Exam:  Constitutional: He appears well-developed and well-nourished .  HENT: Normocephalic. Atraumatic Eyes: EOM are normal. No discharge.  Cardiovascular: RRR. No JVD     Respiratory: CTA Bilaterally. Normal effort  Musculoskeletal: He exhibits noedema, no tenderness  Neurological: He isalertand oriented. Motor: LUE/LLE: 4+/5 prox to distal.  RUE: 4-4+/5. RLE 4/5 (stable).  Cognitively displays reasonable insight and awareness.  . Skin. Warm and dry Psych: Pleasant and cooperative  Assessment/Plan: 1.  Functional deficits secondary to multiple sclerosis exacerbation which require 3+ hours per day of interdisciplinary therapy in a comprehensive inpatient rehab setting. Physiatrist is providing close team supervision and 24 hour management of active medical problems listed below. Physiatrist and rehab team continue to assess barriers to discharge/monitor patient progress toward functional and medical goals.  Function:  Bathing Bathing position   Position: Shower(per pt report)  Bathing parts Body parts bathed by patient: Left arm, Chest, Abdomen, Right arm,  Front perineal area, Buttocks, Right upper leg, Left upper leg, Right lower leg, Left lower leg, Back Body parts bathed by helper: Left lower leg, Back, Right lower leg  Bathing assist Assist Level: Set up      Upper Body Dressing/Undressing Upper body dressing   What is the patient wearing?: Pull over shirt/dress     Pull over shirt/dress - Perfomed by patient: Thread/unthread right sleeve, Thread/unthread left sleeve, Put head through opening, Pull shirt over trunk          Upper body assist Assist Level: More than reasonable time(per pt report)      Lower Body Dressing/Undressing Lower body dressing   What is the patient wearing?: Pants, Non-skid slipper socks, Socks, Shoes     Pants- Performed by patient: Thread/unthread right pants leg, Thread/unthread left pants leg, Pull pants up/down   Non-skid slipper socks- Performed by patient: Don/doff right sock, Don/doff left sock   Socks - Performed by patient: Don/doff right sock, Don/doff left sock   Shoes - Performed by patient: Don/doff right shoe, Don/doff left shoe            Lower body assist Assist for lower body dressing: Set up(per pt report)      Toileting Toileting Toileting activity did not occur: No continent bowel/bladder event Toileting steps completed by patient: Adjust clothing prior to toileting, Performs perineal hygiene, Adjust clothing after toileting      Toileting assist Assist level: Supervision or verbal cues   Transfers Chair/bed transfer   Chair/bed transfer method: Stand pivot Chair/bed transfer assist level: No help, no cues  Chair/bed transfer assistive device: Armrests     Locomotion Ambulation     Max distance: 160 Assist level: Supervision or verbal cues   Wheelchair   Type: Manual Max wheelchair distance: 150 Assist Level: Supervision or verbal cues  Cognition Comprehension Comprehension assist level: Follows basic conversation/direction with no assist  Expression  Expression assist level: Expresses basic needs/ideas: With no assist  Social Interaction Social Interaction assist level: Interacts appropriately with others - No medications needed.  Problem Solving Problem solving assist level: Solves basic problems with no assist  Memory Memory assist level: Recognizes or recalls 90% of the time/requires cueing < 10% of the time   Medical Problem List and Plan: 1.  Decreased functional mobility secondary to MS exacerbation. 5 day course IV Solu-Medrol completed   Continue CIR, plans for d/c tomorrow 2.  DVT Prophylaxis/Anticoagulation: Lovenox. Monitor for any bleeding episodes 3. Pain Management:    Neurontin 300 mg 3 times a day scheduled, changed to prn due to gait imbalance   Baclofen 10 d/ced due to gait imbalance on 12/25   Robaxin PRN started on 12/25 4. Mood: Provide emotional support 5. Neuropsych: This patient is capable of making decisions on his own behalf.   Added amantadine to help with day time energy/arousal.    Vitamin b12 level 366 on 12/12, supplementation initiated   Vitamin D 14.9 on 12/12, supplementation increased to 50,000 units every weekly 6. Skin/Wound Care: Routine skin checks 7. Fluids/Electrolytes/Nutrition: Good oral intake   Cr. WBL on 12/25 8. History of tobacco marijuana use. Provide counseling 9. Constipation. Laxative assistance 10. Hypoalbuminemia   Supplement initiated on 12/22 11. Urinary frequency   UA unremarkable, urine culture NG   Improving  LOS (Days) 7 A FACE TO FACE EVALUATION WAS PERFORMED  Bailey Kolbe Karis Juba, MD 04/24/2017 8:29 AM

## 2017-04-24 NOTE — Progress Notes (Addendum)
Patient remains at high risk for falls with impulsive and uncooperative behaviors with safety measure especially ambulating up to bathroom, He  states 'when I have to go I have to go then".Refuse to allow staff time to get to room before he is found up in BR. This Clinical research associate observed patientwhen  entering room holding onto the sink , his gait became unbalanced and he was assisted to his bed without a fall occurring. Continue to encourage patient to call for staff assistance and wait on staff to arrive. Bed alarm remain on with monitoring.

## 2017-04-24 NOTE — Progress Notes (Signed)
Pt disengages the bed alarm and ambulates in the room. Reviewed the safety plan with pt. Pt states, " I will tell them doctors and your boss this ain't on you, it was me. I'm fine I ain't  gonna  fall ".   Safety plan reviewed with pt.

## 2017-04-24 NOTE — Progress Notes (Signed)
Recd call from pt. Wanted me to inform MD that has increasingly difficulty speaking, increase in difficulty in walking, vision changes in left eye only blurred, Night nurse did notice that pt's gait was worse this am.

## 2017-04-25 ENCOUNTER — Emergency Department (HOSPITAL_COMMUNITY)
Admission: EM | Admit: 2017-04-25 | Discharge: 2017-04-25 | Disposition: A | Discharge status: Home / Self Care | Payer: Medicare HMO | Attending: Emergency Medicine | Admitting: Emergency Medicine

## 2017-04-25 DIAGNOSIS — G35 Multiple sclerosis: Secondary | ICD-10-CM | POA: Insufficient documentation

## 2017-04-25 DIAGNOSIS — F17210 Nicotine dependence, cigarettes, uncomplicated: Secondary | ICD-10-CM | POA: Insufficient documentation

## 2017-04-25 DIAGNOSIS — E119 Type 2 diabetes mellitus without complications: Secondary | ICD-10-CM | POA: Insufficient documentation

## 2017-04-25 DIAGNOSIS — I10 Essential (primary) hypertension: Secondary | ICD-10-CM | POA: Diagnosis not present

## 2017-04-25 DIAGNOSIS — Z79899 Other long term (current) drug therapy: Secondary | ICD-10-CM | POA: Diagnosis not present

## 2017-04-25 DIAGNOSIS — R531 Weakness: Secondary | ICD-10-CM | POA: Diagnosis not present

## 2017-04-25 MED ORDER — METHOCARBAMOL 500 MG PO TABS: 500.0000 mg | ORAL_TABLET | Freq: Three times a day (TID) | ORAL | 0 refills | Status: DC | PRN

## 2017-04-25 MED ORDER — NICOTINE 21 MG/24HR TD PT24: MEDICATED_PATCH | TRANSDERMAL | 0 refills | Status: DC

## 2017-04-25 MED ORDER — CYANOCOBALAMIN 500 MCG PO TABS: 500.0000 ug | ORAL_TABLET | Freq: Every day | ORAL | 0 refills | Status: DC

## 2017-04-25 MED ORDER — NICOTINE 21 MG/24HR TD PT24: 21.0000 mg | MEDICATED_PATCH | Freq: Every day | TRANSDERMAL | 0 refills | Status: DC

## 2017-04-25 MED ORDER — GABAPENTIN 600 MG PO TABS: 300.0000 mg | ORAL_TABLET | Freq: Three times a day (TID) | ORAL | 0 refills | Status: DC | PRN

## 2017-04-25 MED ORDER — AMANTADINE HCL 100 MG PO CAPS: 100.0000 mg | ORAL_CAPSULE | Freq: Two times a day (BID) | ORAL | 0 refills | Status: DC

## 2017-04-25 NOTE — ED Notes (Signed)
Patient wanting water- Clinical research associatewriter notified that the MD needed to see patient before anything was given to eat or drink.

## 2017-04-25 NOTE — Discharge Instructions (Signed)
Inpatient Rehab Discharge Instructions  HYRAM KNOELL Discharge date and time: No discharge date for patient encounter.   Activities/Precautions/ Functional Status: Activity: activity as tolerated Diet: regular diet Wound Care: none needed Functional status:  ___ No restrictions     ___ Walk up steps independently ___ 24/7 supervision/assistance   ___ Walk up steps with assistance ___ Intermittent supervision/assistance  ___ Bathe/dress independently ___ Walk with walker     _x__ Bathe/dress with assistance ___ Walk Independently    ___ Shower independently ___ Walk with assistance    ___ Shower with assistance ___ No alcohol     ___ Return to work/school ________  Special Instructions: No driving   COMMUNITY REFERRALS UPON DISCHARGE:    Home Health:   PT & OT    Agency:[facility]   Phone:[phone]   Date of last service:04/25/2017  Medical Equipment/Items Ordered:NONE NEEDED    My questions have been answered and I understand these instructions. I will adhere to these goals and the provided educational materials after my discharge from the hospital.  Patient/Caregiver Signature _______________________________ Date __________  Clinician Signature _______________________________________ Date __________  Please bring this form and your medication list with you to all your follow-up doctor's appointments.

## 2017-04-25 NOTE — ED Notes (Signed)
[facility] personnel attempting to de-escalate patient

## 2017-04-25 NOTE — Progress Notes (Signed)
Social Work Patient ID: [name], male   DOB: 1981-04-20, 36 y.o.   MRN: [id number] pt plans to go to [facility] home and have gotten the address and set up Home Health services. Pt set to go home today.

## 2017-04-25 NOTE — ED Provider Notes (Signed)
[facility] DEPT Provider Note   CSN: [id number] Arrival date & time: 04/25/17  1604     History   Chief Complaint Chief Complaint  Patient presents with  . Weakness    HPI [name] is a 36 y.o. male.  Patient just discharged from [facility] rehab today.  Patient was admitted to [facility] followed by neurology and internal medicine from December 12 - December 18.  Then was moved over to rehabilitation center.  Notes reviewed.  Patient was discharged home from there today after meeting discharge criteria.  Was to be discharged home to his mother's.  And home therapy was to continue they are.  Patient was apparently picked up by his brother and was taken to his ex-wife house.  There he called EMS to bring him back in seen in addition to been discharge.  Patient feels that he was not ready to go home.      Past Medical History:  Diagnosis Date  . Depression   . Diabetes mellitus without complication ([place])   . Eye abnormalities    unspecified by pt  . Hypertension   . Multiple sclerosis ([place])   . Weakness    generalized    Patient Active Problem List   Diagnosis Date Noted  . Dysesthesia   . Muscle spasms of neck   . Neuropathic pain   . Vitamin B12 deficiency   . Hypoalbuminemia   . Urinary frequency   . Tobacco abuse   . Steroid-induced hyperglycemia   . Marijuana abuse, continuous 04/12/2017  . Vitamin D deficiency 04/12/2017  . Multiple sclerosis exacerbation ([place])   . Abnormality of gait 05/10/2016  . Weakness   . Hypertension   . Depression   . Multiple sclerosis ([place]) 03/02/2011    Past Surgical History:  Procedure Laterality Date  . WISDOM TOOTH EXTRACTION         Home Medications    Prior to Admission medications   Medication Sig Start Date End Date Taking? Authorizing Provider  amantadine (SYMMETREL) 100 MG capsule Take 1 capsule (100 mg total) by mouth 2 (two) times daily. 04/25/17   Angiulli, Lavon Paganini, PA-C    cholecalciferol 2000 units TABS Take 1 tablet (2,000 Units total) by mouth daily. 04/18/17   Alphonzo Grieve, MD  gabapentin (NEURONTIN) 600 MG tablet Take 0.5 tablets (300 mg total) by mouth 3 (three) times daily as needed (neuropathic pain). 04/25/17   Angiulli, Lavon Paganini, PA-C  methocarbamol (ROBAXIN) 500 MG tablet Take 1 tablet (500 mg total) by mouth every 8 (eight) hours as needed for muscle spasms. 04/25/17   Angiulli, Lavon Paganini, PA-C  nicotine (NICODERM CQ - DOSED IN MG/24 HOURS) 21 mg/24hr patch 21 mg daily 1 week and 14 mg patch daily 3 weeks then 7 mg patch daily 3 weeks and stop 04/25/17   Angiulli, Lavon Paganini, PA-C  vitamin B-12 500 MCG tablet Take 1 tablet (500 mcg total) by mouth daily. 04/25/17   Angiulli, Lavon Paganini, PA-C    Family History Family History  Adopted: Yes  Problem Relation Age of Onset  . Multiple sclerosis Cousin   . Heart disease Mother   . Other Father        Does not know his father.    Social History Social History   Tobacco Use  . Smoking status: Current Every Day Smoker    Packs/day: 1.00    Types: Cigarettes  . Smokeless tobacco: Never Used  Substance Use Topics  .  Alcohol use: Yes    Alcohol/week: 7.2 oz    Types: 12 Cans of beer per week  . Drug use: Yes    Frequency: 30.0 times per week    Types: Marijuana    Comment: Daily     Allergies   Patient has no known allergies.   Review of Systems Review of Systems  Constitutional: Negative for fever.  HENT: Negative for congestion.   Eyes: Negative for redness.  Respiratory: Negative for shortness of breath.   Cardiovascular: Negative for chest pain.  Gastrointestinal: Negative for abdominal pain.  Musculoskeletal: Negative for myalgias.  Neurological: Positive for weakness.  Hematological: Does not bruise/bleed easily.  Psychiatric/Behavioral: Negative for confusion.     Physical Exam Updated Vital Signs BP 122/68 (BP Location: Left Arm)   Pulse (!) 59   Temp (!) 97.4 F  (36.3 C) (Oral)   Resp 14   SpO2 100%   Physical Exam  Constitutional: He is oriented to person, place, and time. He appears well-developed and well-nourished. No distress.  HENT:  Head: Normocephalic and atraumatic.  Mouth/Throat: Oropharynx is clear and moist.  Eyes: Conjunctivae and EOM are normal. Pupils are equal, round, and reactive to light.  Cardiovascular: Normal rate and regular rhythm.  Pulmonary/Chest: Effort normal and breath sounds normal.  Abdominal: Soft. Bowel sounds are normal.  Neurological: He is alert and oriented to person, place, and time. No cranial nerve deficit.  Patient with sort of global weakness.  Nursing note and vitals reviewed.    ED Treatments / Results  Labs (all labs ordered are listed, but only abnormal results are displayed) Labs Reviewed - No data to display  EKG  EKG Interpretation None       Radiology No results found.  Procedures Procedures (including critical care time)  Medications Ordered in ED Medications - No data to display   Initial Impression / Assessment and Plan / ED Course  I have reviewed the triage vital signs and the nursing notes.  Pertinent labs & imaging results that were available during my care of the patient were reviewed by me and considered in my medical decision making (see chart for details).    Discussed with social workers.  They reviewed his recent hospitalization and stay with rehab center.  Patient met all criteria for admission.  Does not qualify for readmission.  Patient was to go home to his mother's.  We contacted his mother.  She is more than welcome to have him home.  Patient will be discharged back to his mother's.  That is where further outpatient therapy will be continued through home health care.  Patient without any new or worse symptoms warranting admission.  Patient does have severe MS.  There is no question about that.  Studies done during his hospitalization showed significant  demyelination.  Final Clinical Impressions(s) / ED Diagnoses   Final diagnoses:  MS (multiple sclerosis) [place])    ED Discharge Orders    None       Fredia Sorrow, MD 04/25/17 [phone]

## 2017-04-25 NOTE — ED Notes (Signed)
[facility] called for transport.  

## 2017-04-25 NOTE — ED Notes (Signed)
Pt cussing at RN stating "I feel fucking crazy as hell. I just want a joint. I come to this hospital to seek treatment and yall tell me there aint nothing yall can do for me? That's okay I'll go to fucking [place], [place], [place], until I get admitted. I am in [place] and yall don't even care or understand." Pt continued to rant at nurse.  RN asked patient what expectations he had and pt continued to rant and cut nurse off. Pt stated "You all just are telling me I have to suffer.That's what yall are telling me." Pt would not listen to anything RN had to say. Pt was making claims that he was "worse today then when he started rehab".

## 2017-04-25 NOTE — ED Triage Notes (Signed)
Per EMS-was discharged from  [facility] today after being admitted on the 18th for same complaints-patient did not get prescriptions filled-states to weed use

## 2017-04-25 NOTE — ED Notes (Signed)
Patient stated he called out for restroom assistance but no one ever came. Patient urinated on himself and told Molly Maduro, EMT that he did not want to be cleaned up until he spoke with his wife on the phone.

## 2017-04-25 NOTE — Progress Notes (Signed)
[place] PHYSICAL MEDICINE & REHABILITATION     PROGRESS NOTE    Subjective/Complaints: Pt seen sitting up in bed this AM.  He states he slept well overnight.  He would like to stay in rehab longer, but notes this is how he was for the last year.  He notes some pain that was relieved with Gabapentin yesterday.   ROS: Denies nausea, vomiting, diarrhea, shortness of breath or chest pain   Objective: Vital Signs: Blood pressure 129/60, pulse 66, temperature 98 F (36.7 C), temperature source Oral, resp. rate 16, height 5\' 10"  (1.778 m), weight 81 kg (178 lb 9.2 oz), SpO2 100 %. No results found. Recent Labs    04/24/17 1414  WBC 4.2  HGB 13.0  HCT 39.3  PLT 219   Recent Labs    04/24/17 0616 04/24/17 1414  NA  --  138  K  --  4.5  CL  --  106  GLUCOSE  --  118*  BUN  --  17  CREATININE 0.97 1.03  CALCIUM  --  9.2   CBG (last 3)  No results for input(s): GLUCAP in the last 72 hours.  Wt Readings from Last 3 Encounters:  04/25/17 81 kg (178 lb 9.2 oz)  04/16/17 78.5 kg (173 lb 1 oz)  07/26/16 79.6 kg (175 lb 8 oz)    Physical Exam:  Constitutional: He appears well-developed and well-nourished .  HENT: Normocephalic. Atraumatic Eyes: EOM are normal. No discharge.  Cardiovascular: RRR. No JVD     Respiratory: CTA Bilaterally. Normal effort  Musculoskeletal: He exhibits noedema, no tenderness  Neurological: He isalertand oriented. Motor: LUE/LLE: 4+/5 prox to distal.  RUE: 4-4+/5. RLE 4/5 (unchanged).  Cognitively displays reasonable insight and awareness.  . Skin. Warm and dry Psych: Pleasant and cooperative  Assessment/Plan: 1.  Functional deficits secondary to multiple sclerosis exacerbation which require 3+ hours per day of interdisciplinary therapy in a comprehensive inpatient rehab setting. Physiatrist is providing close team supervision and 24 hour management of active medical problems listed below. Physiatrist and rehab team continue to assess  barriers to discharge/monitor patient progress toward functional and medical goals.  Function:  Bathing Bathing position   Position: Shower(per pt report)  Bathing parts Body parts bathed by patient: Left arm, Chest, Abdomen, Right arm, Front perineal area, Buttocks, Right upper leg, Left upper leg, Right lower leg, Left lower leg, Back Body parts bathed by helper: Left lower leg, Back, Right lower leg  Bathing assist Assist Level: Set up      Upper Body Dressing/Undressing Upper body dressing   What is the patient wearing?: Pull over shirt/dress     Pull over shirt/dress - Perfomed by patient: Thread/unthread right sleeve, Thread/unthread left sleeve, Put head through opening, Pull shirt over trunk          Upper body assist Assist Level: More than reasonable time(per pt report)      Lower Body Dressing/Undressing Lower body dressing   What is the patient wearing?: Pants, Non-skid slipper socks, Socks, Shoes     Pants- Performed by patient: Thread/unthread right pants leg, Thread/unthread left pants leg, Pull pants up/down   Non-skid slipper socks- Performed by patient: Don/doff right sock, Don/doff left sock   Socks - Performed by patient: Don/doff right sock, Don/doff left sock   Shoes - Performed by patient: Don/doff right shoe, Don/doff left shoe            Lower body assist Assist for lower body dressing: Set  up(per pt report)      Financial trader activity did not occur: No continent bowel/bladder event Toileting steps completed by patient: Adjust clothing prior to toileting, Performs perineal hygiene, Adjust clothing after toileting   Toileting Assistive Devices: Grab bar or rail  Toileting assist Assist level: Supervision or verbal cues   Transfers Chair/bed transfer   Chair/bed transfer method: Stand pivot Chair/bed transfer assist level: No help, no cues Chair/bed transfer assistive device: Armrests     Locomotion Ambulation      Max distance: 160 Assist level: Supervision or verbal cues   Wheelchair   Type: Manual Max wheelchair distance: 150 Assist Level: Supervision or verbal cues  Cognition Comprehension Comprehension assist level: Follows complex conversation/direction with no assist  Expression Expression assist level: Expresses basic needs/ideas: With no assist  Social Interaction Social Interaction assist level: Interacts appropriately with others - No medications needed.  Problem Solving Problem solving assist level: Solves basic problems with no assist  Memory Memory assist level: Recognizes or recalls 90% of the time/requires cueing < 10% of the time   Medical Problem List and Plan: 1.  Decreased functional mobility secondary to MS exacerbation. 5 day course IV Solu-Medrol completed   D/c today 2.  DVT Prophylaxis/Anticoagulation: Lovenox. Monitor for any bleeding episodes 3. Pain Management:    Neurontin 300 mg 3 times a day scheduled, changed to prn due to gait imbalance   Baclofen 10 d/ced due to gait imbalance on 12/25   Robaxin PRN started on 12/25 4. Mood: Provide emotional support 5. Neuropsych: This patient is capable of making decisions on his own behalf.   Added amantadine to help with day time energy/arousal.    Vitamin b12 level 366 on 12/12, supplementation initiated   Vitamin D 14.9 on 12/12, supplementation increased to 50,000 units every weekly 6. Skin/Wound Care: Routine skin checks 7. Fluids/Electrolytes/Nutrition: Good oral intake   BMP within acceptable range on 12/25 8. History of tobacco marijuana use. Provide counseling 9. Constipation. Laxative assistance 10. Hypoalbuminemia   Supplement initiated on 12/22 11. Urinary frequency   UA unremarkable, urine culture NG   Improving  LOS (Days) 8 A FACE TO FACE EVALUATION WAS PERFORMED  Angellynn Kimberlin Karis Juba, MD 04/25/2017 8:55 AM

## 2017-04-25 NOTE — Progress Notes (Signed)
Patient received discharge instructions from Deatra Ina, PA-C with verbal understanding. Patient discharged to mother's home with belongings.

## 2017-04-25 NOTE — ED Notes (Signed)
Pt's mother made aware pt will be coming via ambulance. Pt's mother stated she would be there

## 2017-04-25 NOTE — Discharge Instructions (Signed)
Discussed with social worker.  You met all your discharge criteria.  Rest of the physical therapy and occupational therapy will occur at home.  That is planned and set up to encourage her mother's house.  They intended you to be discharged back to her mother's house.  No indication for readmission. °

## 2017-04-25 NOTE — Progress Notes (Addendum)
CSW received phone call from pt's EDP about pt's discharge plan. From EDP, pt demanding placement and recently discharged from [facility].   CSW explained to pt's nurse that pt can not be placed from the ED. Pt has home health set up per previous notes during admission at [facility] and should return home.   Plan: Pt will return home with home health rehab.   CSW is at [employer] covering for [employer]. Pt's nurse offered to inform pt of this information.   There are no further CSW needs at this time. CSW signing off.   Montine Circle, [facility]  [phone]

## 2017-04-25 NOTE — ED Notes (Signed)
Pt now states he will take ambulance because he cannot walk

## 2017-04-25 NOTE — ED Notes (Signed)
Molly Maduro, EMT and writer went into patient's room to clean patient up after urinating on himself. Patient cussing at Clinical research associate and Molly Maduro for not "understanding" how patient feels. Writer did tell patient she understood he was frustrated and wanted to know what she could do to help patient and patient began cussing at Clinical research associate. Writer gave patient a warm blanket and told patient that she would get someone else to talk with him as Clinical research associate was not going to listen to patient cuss at her. Charge RN and RN taking care of patient made aware.

## 2017-04-25 NOTE — ED Notes (Addendum)
Pt reports that he will be going to his mother's house.  Pt states he will refuse medical transport and "will call an uber"

## 2017-04-25 NOTE — ED Notes (Signed)
Patient cursing at [facility]

## 2017-04-26 NOTE — Discharge Summary (Signed)
[name]:  [name], [name]                 ACCOUNT NO.:  [id number]  MEDICAL RECORD NO.:  [id number]  LOCATION:                                 FACILITY:  PHYSICIAN:  Ranelle OysterZachary T. Swartz, M.D.DATE OF BIRTH:  1981-04-22  DATE OF ADMISSION:  04/17/2017 DATE OF DISCHARGE:  04/25/2017                              DISCHARGE SUMMARY   DISCHARGE DIAGNOSES: 1. Multiple sclerosis exacerbation. 2. Subcutaneous Lovenox for deep vein thrombosis prophylaxis. 3. Pain management. 4. History of tobacco and marijuana use. 5. Constipation. 6. Urinary frequency.  This is a 36 year old right-handed male with history of hypertension, multiple sclerosis diagnosed in 2010, followed by Dr. Terrace ArabiaYan of Neurology Services, on no prescription medications at time of admission.  Per chart review, lives with cousins, independent prior to admission. Ambulating short household distances.  Still drives.  Family in the area, checks on him as needed.  Presented on April 11, 2017, with increasing weakness over 5 days, blurry vision.  Urine drug screen positive marijuana.  MRI of the brain showed progressive disease MRI of thoracic/cervical spine showed significant progression, focal white matter disease involving callososeptal margin compatible with multiple sclerosis, demyelination, progressive white matter changes, spinal cord, particularly at the C2 level.  Neurology consulted.  Placed on intravenous Solu-Medrol x5 days for exacerbation of multiple sclerosis. Subcutaneous Lovenox for DVT prophylaxis.  Physical and occupational therapy ongoing.  The patient was admitted for comprehensive rehab program.  PAST MEDICAL HISTORY:  See discharge diagnoses.  SOCIAL HISTORY:  Lives with cousins.  Independent household ambulator.  FUNCTIONAL STATUS UPON ADMISSION TO REHAB SERVICES:  Min to mod assist 160 feet without assistive device, minimal assist sit to stand, min to mod assist activities daily living.  PHYSICAL  EXAMINATION:  VITAL SIGNS:  Blood pressure 131/69, pulse 58, temperature 98, and respirations 18. GENERAL:  Alert male, in no acute distress. HEENT:  EOMs intact. NECK:  Supple, nontender.  No JVD. CARDIAC:  Rate controlled. ABDOMEN:  Soft, nontender.  Good bowel sounds. LUNGS:  Clear to auscultation without wheeze. NEUROLOGICAL:  Muscle strength 4- to 4/5.  Sensation intact to light touch.  REHABILITATION HOSPITAL COURSE:  The patient was admitted to [facility] with therapies initiated on a 3-hour daily basis, consisting of physical therapy, occupational therapy, and rehabilitation nursing.  The following issues were addressed during the patient's rehabilitation stay.  Pertaining to Mr. Henry CharterMason's multiple sclerosis exacerbation, he completed a 5-day course of intravenous Solu-Medrol, he would follow up with Neurology Services.  Subcutaneous Lovenox for DVT prophylaxis, no bleeding episodes.  Pain management with the use of Neurontin as needed.  Baclofen had been attempted, but discontinued due to some gait imbalance.  Blood pressures well controlled.  He did have a history of tobacco and marijuana use, receiving counseling in regard to cessation of these products.  It was questionable if he would be compliant with these requests.  He had some urinary frequency related to his multiple sclerosis.  Postvoid residuals were within normal limits. Urine culture negative.  The patient received weekly collaborative interdisciplinary team conferences to discuss estimated length of stay, family teaching, any barriers to discharge.  Working with activity tolerance.  Modified  independent sit to stand, modified independent stand pivot transfers.  Ambulating 160 feet, supervision.  He could gather belongings for activities of daily living and homemaking.  He was advised no driving.  Plan was discharged to home with family after family teaching completed.  DISCHARGE MEDICATIONS: 1.  Amantadine 100 mg p.o. b.i.d. 2. NicoDerm patch, taper as directed. 3. Vitamin B12 5000 mcg daily. 4. Vitamin D 50,000 units weekly. 5. Robaxin 500 mg p.o. every 8 hours as needed muscle spasms. 6. Neurontin 300 mg p.o. t.i.d. as needed.  DIET:  His diet was regular.  FOLLOWUP:  The patient would follow up with Dr. Faith Rogue at the Outpatient Rehab Service office as directed; Dr. Terrace Arabia, neurology Services, call for appointment; Lonie Peak, physician assistant.  SPECIAL INSTRUCTIONS:  No driving.  No smoking.  No alcohol.  No illicit drug use.     Mariam Dollar, P.A.   ______________________________ Ranelle Oyster, M.D.    DA/MEDQ  D:  04/24/2017  T:  04/24/2017  Job:  [id number]  cc:   Ranelle Oyster, M.D. Lonie Peak, PA Levert Feinstein, MD

## 2017-04-27 ENCOUNTER — Telehealth: Payer: Self-pay | Admitting: *Deleted

## 2017-04-27 NOTE — Progress Notes (Signed)
Social Work  Discharge Note  The overall goal for the admission was met for:   Discharge location: Yes - pt d/c'd home with mother  Length of Stay: Yes - 8 days  Discharge activity level: Yes - supervision to minimal assistance  Home/community participation: Yes  Services provided included: MD, RD, PT, OT, SLP, RN, TR, Pharmacy and [place] [facility]  Follow-up services arranged: Home Health: PT, OT via [facility]  Comments (or additional information): see SW note of 12/24  Patient/Family verbalized understanding of follow-up arrangements: Yes  Individual responsible for coordination of the follow-up plan: pt  Confirmed correct DME delivered: NA  [name]

## 2017-04-27 NOTE — Telephone Encounter (Signed)
  Hughes Better (or at least that is what it sounded like), RN from [facility] left a message asking if our Doctors were going to be signing home health orders.  She referenced Dr. Allena Katz, however Dr. Riley Kill is listed as discharging physician.  Please call to confirm and route order request to proper physician.

## 2017-04-30 NOTE — Telephone Encounter (Signed)
According to discharge notes written by [facility], I believe it to be Dr. Riley Kill that will see this patient.  Forwarding messages to him for disclosure.

## 2017-05-02 NOTE — Telephone Encounter (Signed)
Yes, he's my patient

## 2017-05-14 ENCOUNTER — Telehealth: Payer: Self-pay | Admitting: *Deleted

## 2017-05-14 NOTE — Telephone Encounter (Signed)
Lemtrada labs collected1/03/2018  Helper T-Lymph-CD4 -Absolute CD 4 Helper:185(L)  WBC:2.6(L) Hemoglobin: 12.9 (L) Lymphs (Absolute): 0.6 (L)  UA:WNL  Creatinine: 0.84(WNL)  TSH: 0.836(WNL)  All other values WNL.  Labs provided to Dr. Yan for review and sent for scanning. 

## 2017-05-29 ENCOUNTER — Encounter: Payer: Medicare HMO | Attending: Physical Medicine & Rehabilitation | Admitting: Physical Medicine & Rehabilitation

## 2017-06-05 ENCOUNTER — Telehealth: Payer: Self-pay | Admitting: *Deleted

## 2017-06-05 NOTE — Telephone Encounter (Signed)
Patient calling to get [facility] lab results.

## 2017-06-05 NOTE — Telephone Encounter (Addendum)
Spoke to patient - he is aware there are no concerns with his [place] labs.  He was concerned about Lemtrada causing hair loss.  Verified with Dr. Epimenio Foot that this symptom is not a side effect of this medication. He did recommend the patient start zinc and selenium to help his hair loss.  Patient was uninterested in this suggestion and stated he was unwilling to try these supplements.  He was last seen in 06/2016 and I attempted to schedule an appt for him but he declined.  He has been asked multiple times in the past to come in for an appt.  He stated "Dr. Terrace Arabia just takes his $50 copay and does not do anything".  Also, that "those people at [facility] do not know what they are talking about". The call disconnected from his end.  Per the infusion center, he has also made derogatory comments about Dr. Terrace Arabia and the care he is receiving at this office. These comments have been made in front of other patients.    Dr. Terrace Arabia does not feel comfortable continuing this patient's care.  Clearly, he is unhappy with our services as well. Due to the decline in the patient/provider relationship and his feelings toward our practice as a whole, she feels it is best he be referred to another neurologist.  This is in effort for him to also feel comfortable with his physician so he will get the care he needs for his MS.  He is being compliant with his monthly labs through the [facility] program and they are being monitored.  However, it is not ethical or medically safe for her to order his second year Lemtrada infusion (due in June 2019) due to his unwillingness to follow up with her.

## 2017-06-05 NOTE — Telephone Encounter (Signed)
Lemtrada labs collected1/03/2018  Helper T-Lymph-CD4 -Absolute CD 4 Helper:185(L)  WBC:2.6(L) Hemoglobin: 12.9 (L) Lymphs (Absolute): 0.6 (L)  UA:WNL  Creatinine: 0.84(WNL)  TSH: 0.836(WNL)  All other values WNL.  Labs provided to Dr. Terrace Arabia for review and sent for scanning.

## 2017-06-06 NOTE — Telephone Encounter (Signed)
Left message for [name] [phone]) w/ [name].  Requested her to return my call.

## 2017-06-06 NOTE — Telephone Encounter (Addendum)
Spoke to Sher[place] - she will make note that Dr. Terrace Arabia does not feel comfortable proceeding with this patient's second year dose of Lemtrada. She does not feel it is medically safe to continue prescribing this medication with his unwillingness to see her in the office and the decline in their provider/patient relationship. She is aware that we are going to offer to refer him to another neurologist.  If he accepts the referral, then we will complete a Lemtrada transfer of care.  Otherwise, Dr. Terrace Arabia will continue to responsibly monitor his monthly labs for the recommended post-infusion period.

## 2017-06-07 ENCOUNTER — Other Ambulatory Visit: Payer: Self-pay | Admitting: *Deleted

## 2017-06-07 DIAGNOSIS — G35 Multiple sclerosis: Secondary | ICD-10-CM

## 2017-06-07 NOTE — Progress Notes (Unsigned)
a 

## 2017-06-07 NOTE — Telephone Encounter (Signed)
I called the patient.  I explained that since he expressed concerned about his provider and her abilities to treat him that it may be best that we refer him to another neurologist.  I explained that it is important that he has 100% confidence in his provider and her ability to treat him.  He agrees.  He is in agreement to see another neurologist at a different practice.  I explained that we would make a referral and ensure that he has an appointment before June before his next dose of Lemtrada.  We will continue to check his lab work monthly until he is established with another neurologist.  I did explain that if he missed his appointment with a new neurologist our office would not infuse his second dose of Lemtrada.  However we will continue to check his lab work monthly as required by [employer] we would no longer see him in our office.  He does understand that he is being dismissed from our practice but because of the medication that he has been prescribed we will make a referral to another neurologist.

## 2017-06-07 NOTE — Telephone Encounter (Signed)
Referral order placed in Epic. 

## 2017-06-08 NOTE — Telephone Encounter (Signed)
Noted referral has been sent °

## 2017-06-11 ENCOUNTER — Encounter: Payer: Self-pay | Admitting: Neurology

## 2017-06-12 ENCOUNTER — Telehealth: Payer: Self-pay | Admitting: *Deleted

## 2017-06-12 ENCOUNTER — Encounter: Payer: Self-pay | Admitting: Neurology

## 2017-06-12 NOTE — Telephone Encounter (Addendum)
Lemtrada labs collected2/8/19:  Helper T-Lymph-CD4 -Absolute CD 4 Helper:183(L) -% CD 4 Pos. Lymph: 30.5 (L)  WBC:3.3(L) Lymphs (Absolute): 0.6 (L)  UA:WNL  Creatinine: 0.93(WNL)  TSH: 0.412 (L)  All other values WNL.  Labs provided to Dr. Terrace Arabia for review and sent for scanning.  She is aware that his TSH is mildly low this month.  She plans to monitor this lab closely.    Patient has a pending appointment to establish care with Dr. Everlena Cooper on 09/25/17.

## 2017-06-12 NOTE — Telephone Encounter (Signed)
Butch Penny has called the patient and discussed his labs results with him.  Reported a rash on his chest and he was instructed to seek further evaluation with his PCP.  If his PCP feels we need to see him, then he is welcome to call us back.

## 2017-07-02 ENCOUNTER — Encounter: Payer: Self-pay | Admitting: Neurology

## 2017-07-05 ENCOUNTER — Telehealth: Payer: Self-pay

## 2017-07-05 NOTE — Telephone Encounter (Signed)
Labs collected 07/02/2017  Helper T-lymph CD4 absolute CD 4 Helper-202  % CD 4 Pos. Lymph.- 28.8  WBC-2.7  Hemoglobin- 12.7  All other labs WNL. Report given to Dr. Terrace Arabia for review and scanning.

## 2017-07-25 DIAGNOSIS — G35 Multiple sclerosis: Secondary | ICD-10-CM | POA: Diagnosis not present

## 2017-07-25 DIAGNOSIS — Z87891 Personal history of nicotine dependence: Secondary | ICD-10-CM | POA: Diagnosis not present

## 2017-07-25 DIAGNOSIS — Z79899 Other long term (current) drug therapy: Secondary | ICD-10-CM | POA: Diagnosis not present

## 2017-07-25 DIAGNOSIS — Z6825 Body mass index (BMI) 25.0-25.9, adult: Secondary | ICD-10-CM | POA: Diagnosis not present

## 2017-08-08 ENCOUNTER — Ambulatory Visit: Payer: Medicare HMO | Admitting: Neurology

## 2017-08-14 ENCOUNTER — Telehealth: Payer: Self-pay | Admitting: Neurology

## 2017-08-14 ENCOUNTER — Other Ambulatory Visit: Payer: Self-pay | Admitting: *Deleted

## 2017-08-14 ENCOUNTER — Telehealth: Payer: Self-pay | Admitting: *Deleted

## 2017-08-14 DIAGNOSIS — G35 Multiple sclerosis: Secondary | ICD-10-CM

## 2017-08-14 NOTE — Telephone Encounter (Addendum)
Patient has apt with [facility] 08/21/2017 arrive at 2:45 for 3:00 apt.  [address] [place] [address]. Telephone [phone] - fax [phone]. Patient will see - Dr. Wille Celeste Azou-Zeid . Transfer of care.  I have called and spoke to patient with details of his apt . Patient understood.      Faxed all records. Labs, Mri's and office notes . Mailed MRI CD.

## 2017-08-14 NOTE — Telephone Encounter (Signed)
Called patient and he is aware. 

## 2017-08-14 NOTE — Telephone Encounter (Signed)
Referral placed to [facility] to see MS specialist.  His first year [name] was given June 11-15, 2018.  Patient needs to be made aware of this new referral and to expect a call for scheduling.

## 2017-08-16 NOTE — Telephone Encounter (Signed)
Noted  

## 2017-08-21 NOTE — Telephone Encounter (Signed)
noted 

## 2017-08-21 NOTE — Telephone Encounter (Signed)
Spoke to [facility] [phone]) - they stated patient canceled his 08/21/17 appointment and did not reschedule.

## 2017-08-21 NOTE — Telephone Encounter (Signed)
I called the patient.  He states that he was unable to make appointment today.  He was under the impression of they will call him to reschedule.  I provided him with the number to [facility] neurology.  He states that he will call now and reschedule his appointment.  I again informed the patient that we would follow lab work for the recommended time due to his infusion of Lemtrada.  However since he was dismissed from our office he would not get the second infusion here.  He voiced understanding.

## 2017-09-19 DIAGNOSIS — G35 Multiple sclerosis: Secondary | ICD-10-CM | POA: Diagnosis not present

## 2017-09-19 DIAGNOSIS — Z9181 History of falling: Secondary | ICD-10-CM | POA: Diagnosis not present

## 2017-09-19 DIAGNOSIS — Z820 Family history of epilepsy and other diseases of the nervous system: Secondary | ICD-10-CM | POA: Diagnosis not present

## 2017-09-19 DIAGNOSIS — R69 Illness, unspecified: Secondary | ICD-10-CM | POA: Diagnosis not present

## 2017-09-19 DIAGNOSIS — Z720 Tobacco use: Secondary | ICD-10-CM | POA: Diagnosis not present

## 2017-09-25 ENCOUNTER — Ambulatory Visit: Payer: Medicare Other | Admitting: Neurology

## 2017-09-25 ENCOUNTER — Encounter

## 2017-10-25 DIAGNOSIS — R7989 Other specified abnormal findings of blood chemistry: Secondary | ICD-10-CM | POA: Diagnosis not present

## 2017-10-25 DIAGNOSIS — G35 Multiple sclerosis: Secondary | ICD-10-CM | POA: Diagnosis not present

## 2017-10-25 DIAGNOSIS — R69 Illness, unspecified: Secondary | ICD-10-CM | POA: Diagnosis not present

## 2017-10-25 DIAGNOSIS — Z6825 Body mass index (BMI) 25.0-25.9, adult: Secondary | ICD-10-CM | POA: Diagnosis not present

## 2017-10-25 DIAGNOSIS — Z1331 Encounter for screening for depression: Secondary | ICD-10-CM | POA: Diagnosis not present

## 2017-10-25 DIAGNOSIS — Z7689 Persons encountering health services in other specified circumstances: Secondary | ICD-10-CM | POA: Diagnosis not present

## 2017-10-25 DIAGNOSIS — R531 Weakness: Secondary | ICD-10-CM | POA: Diagnosis not present

## 2017-11-11 ENCOUNTER — Inpatient Hospital Stay (HOSPITAL_COMMUNITY)
Admission: EM | Admit: 2017-11-11 | Discharge: 2017-11-15 | DRG: 060 | Disposition: A | Discharge status: Home Health | Payer: Medicare HMO | Attending: Internal Medicine | Admitting: Internal Medicine

## 2017-11-11 ENCOUNTER — Encounter (HOSPITAL_COMMUNITY): Payer: Self-pay | Admitting: *Deleted

## 2017-11-11 ENCOUNTER — Other Ambulatory Visit: Payer: Self-pay

## 2017-11-11 DIAGNOSIS — R739 Hyperglycemia, unspecified: Secondary | ICD-10-CM | POA: Diagnosis not present

## 2017-11-11 DIAGNOSIS — R531 Weakness: Secondary | ICD-10-CM | POA: Diagnosis not present

## 2017-11-11 DIAGNOSIS — I10 Essential (primary) hypertension: Secondary | ICD-10-CM | POA: Diagnosis present

## 2017-11-11 DIAGNOSIS — F17200 Nicotine dependence, unspecified, uncomplicated: Secondary | ICD-10-CM | POA: Diagnosis present

## 2017-11-11 DIAGNOSIS — R69 Illness, unspecified: Secondary | ICD-10-CM | POA: Diagnosis not present

## 2017-11-11 DIAGNOSIS — T380X5A Adverse effect of glucocorticoids and synthetic analogues, initial encounter: Secondary | ICD-10-CM | POA: Diagnosis not present

## 2017-11-11 DIAGNOSIS — M542 Cervicalgia: Secondary | ICD-10-CM | POA: Diagnosis not present

## 2017-11-11 DIAGNOSIS — G35 Multiple sclerosis: Secondary | ICD-10-CM | POA: Diagnosis not present

## 2017-11-11 LAB — URINALYSIS, ROUTINE W REFLEX MICROSCOPIC
Bacteria, UA: NONE SEEN
Glucose, UA: NEGATIVE mg/dL
Hgb urine dipstick: NEGATIVE
Ketones, ur: NEGATIVE mg/dL
Leukocytes, UA: NEGATIVE
Nitrite: NEGATIVE
Protein, ur: NEGATIVE mg/dL
Specific Gravity, Urine: 1.027 (ref 1.005–1.030)
pH: 7 (ref 5.0–8.0)

## 2017-11-11 LAB — CBC
HEMATOCRIT: 41 % (ref 39.0–52.0)
Hemoglobin: 13.1 g/dL (ref 13.0–17.0)
MCH: 28.3 pg (ref 26.0–34.0)
MCHC: 32 g/dL (ref 30.0–36.0)
MCV: 88.6 fL (ref 78.0–100.0)
Platelets: 215 10*3/uL (ref 150–400)
RBC: 4.63 MIL/uL (ref 4.22–5.81)
RDW: 13.4 % (ref 11.5–15.5)
WBC: 4.1 10*3/uL (ref 4.0–10.5)

## 2017-11-11 LAB — COMPREHENSIVE METABOLIC PANEL
ALBUMIN: 3.7 g/dL (ref 3.5–5.0)
ALK PHOS: 46 U/L (ref 38–126)
ALT: 21 U/L (ref 0–44)
AST: 18 U/L (ref 15–41)
Anion gap: 5 (ref 5–15)
BILIRUBIN TOTAL: 0.4 mg/dL (ref 0.3–1.2)
BUN: 10 mg/dL (ref 6–20)
CALCIUM: 8.9 mg/dL (ref 8.9–10.3)
CO2: 27 mmol/L (ref 22–32)
CREATININE: 0.91 mg/dL (ref 0.61–1.24)
Chloride: 108 mmol/L (ref 98–111)
GFR calc Af Amer: >60 mL/min (ref >60)
GFR calc non Af Amer: >60 mL/min (ref >60)
GLUCOSE: 101 mg/dL — AB (ref 70–99)
Potassium: 4.3 mmol/L (ref 3.5–5.1)
Sodium: 140 mmol/L (ref 135–145)
TOTAL PROTEIN: 6.9 g/dL (ref 6.5–8.1)

## 2017-11-11 LAB — LIPASE, BLOOD: Lipase: 37 U/L (ref 11–51)

## 2017-11-11 MED ORDER — ENOXAPARIN SODIUM 40 MG/0.4ML ~~LOC~~ SOLN
40.0000 mg | SUBCUTANEOUS | Status: DC
  Administered 2017-11-11 – 2017-11-14 (×4): 40 mg via SUBCUTANEOUS
  Filled 2017-11-11 (×4): qty 0.4

## 2017-11-11 MED ORDER — ACETAMINOPHEN 500 MG PO TABS
1000.0000 mg | ORAL_TABLET | Freq: Four times a day (QID) | ORAL | Status: DC | PRN
  Administered 2017-11-13: 1000 mg via ORAL
  Filled 2017-11-11: qty 2

## 2017-11-11 MED ORDER — SODIUM CHLORIDE 0.9 % IV SOLN
1000.0000 mg | Freq: Once | INTRAVENOUS | Status: AC
  Administered 2017-11-11: 1000 mg via INTRAVENOUS
  Filled 2017-11-11: qty 8

## 2017-11-11 MED ORDER — SODIUM CHLORIDE 0.9 % IV SOLN
1000.0000 mg | Freq: Every day | INTRAVENOUS | Status: DC
  Administered 2017-11-12 – 2017-11-15 (×4): 1000 mg via INTRAVENOUS
  Filled 2017-11-11 (×4): qty 8

## 2017-11-11 NOTE — ED Triage Notes (Signed)
The pt reports that he has multiple sclerosis  And for 1-2 weeks he has multiple body aches and he has he has rt sided weakness and pain more than usual

## 2017-11-11 NOTE — Consult Note (Signed)
Referring Physician: Julianne Rice, MD    Chief Complaint: Generalized Body Aches and Multiple Sclerosis   HPI: [name] is an 37 y.o. male with a past medical history of depression, diabetes mellitus, multiple sclerosis diagnosed in August 2010 currently on IV infusion Lemtrada who presents to the ED with complaints of Right upper and lower extremity pain with right leg weakness over the last month but worsened over the last week.  He describes pain as aching with impairment in mobility and admits to falling 3 times a day.  He denies difficulty swallowing, shortness of breath, fevers, chills, recent trauma, change in vision-at baseline he has blurred vision and intermittent double vision.  At baseline he has mild gait instability.  He complains of mild neck pain and sharp shooting pain with head nod.  Patient states he has not been to his neurologist lately as he does not like his current neurologist and is supposed to follow-up with neurologist at [facility] to resume his [place] but he states he is unable to sit in the car due to the sunlight shining in the car.  Assessment patient is awake alert and oriented x3, has some spasticity in his right lower extremity with brisk reflexes, he has clear speech and in  no distress   Past Medical History:  Diagnosis Date  . Depression   . Diabetes mellitus without complication ([place])   . Eye abnormalities    unspecified by pt  . Hypertension   . Multiple sclerosis ([place])   . Weakness    generalized    Past Surgical History:  Procedure Laterality Date  . WISDOM TOOTH EXTRACTION      Family History  Adopted: Yes  Problem Relation Age of Onset  . Multiple sclerosis Cousin   . Heart disease Mother   . Other Father        Does not know his father.   Social History:  reports that he has been smoking cigarettes.  He has been smoking about 1.00 pack per day. He has never used smokeless tobacco. He reports that he drinks about 7.2 oz of  alcohol per week. He reports that he has current or past drug history. Drug: Marijuana. Frequency: 30.00 times per week.  Allergies: No Known Allergies  Medications:   ROS: General: negative for - chills, fatigue, fever, night sweats, weight gain or weight loss Psychological  negative for - behavioral disorder, Ophthalmic : admits to  blurry vision, double vision,  denies occular pain or loss of vision ENT: negative for - epistaxis, nasal discharge, vertigo, tinnitus Respiratory: negative for - cough, hemoptysis, shortness of breath or wheezing Cardiovascular: negative for - chest pain, dyspnea on exertion, edema or irregular heartbeat Gastrointestinal: negative for - abdominal pain, diarrhea, hematemesis, nausea/vomiting or stool incontinence Genito-Urinary: negative for - dysuria, hematuria, incontinence or urinary frequency/urgency Musculoskeletal: negative for - joint swelling or muscular weakness Neurological: as noted in HPI Dermatological: skin dryness and itching   Physical Examination: Blood pressure (!) 172/70, pulse 80, temperature 99 F (37.2 C), temperature source Oral, resp. rate 18, height 5' 10"  (1.778 m), weight 77.1 kg (170 lb), SpO2 100 %. HEENT-  Normocephalic, no lesions, without obvious abnormality.  Normal external eye and conjunctiva.   Cardiovascular- S1-S2 audible, pulses palpable throughout   Lungs-no rhonchi or wheezing noted, no excessive working breathing.   Abdomen- All 4 quadrants palpated and nontender Musculoskeletal-no joint tenderness, deformity or swelling Skin-warm very dry  Neurological Examination Mental Status: Alert, oriented, thought content appropriate.  Speech fluent without evidence of aphasia.  Able to follow 3 step commands without difficulty. Cranial Nerves: II: Visual fields grossly normal with blurred vision III,IV, VI: ptosis not present, extra-ocular motions intact bilaterally pupils equal, round, reactive to light  V,VII: smile  symmetric, facial light touch sensation normal bilaterally VIII: Hearing intact to voice IX,X: uvula rises symmetrically XI: bilateral shoulder shrug XII: midline tongue extension Motor: Right : Upper extremity   4/5    Left:     Upper extremity   5/5  Lower extremity   3/5     Lower extremity   5/5 Tone and bulk:normal tone throughout; no atrophy noted Sensory: Pinprick and light touch intact throughout, bilaterally Deep Tendon Reflexes: 3+ and symmetric in BLE Plantars: Right: downgoing   Left: downgoing Cerebellar: normal finger-to-nose,unable to bend knees to perform heel to shin test on RLE Gait: Not tested    Results for orders placed or performed during the hospital encounter of 11/11/17 (from the past 48 hour(s))  Lipase, blood     Status: None   Collection Time: 11/11/17  3:29 PM  Result Value Ref Range   Lipase 37 11 - 51 U/L    Comment: Performed at [facility], [place] [address], [place], [place] [address]  Comprehensive metabolic panel     Status: Abnormal   Collection Time: 11/11/17  3:29 PM  Result Value Ref Range   Sodium 140 135 - 145 mmol/L   Potassium 4.3 3.5 - 5.1 mmol/L   Chloride 108 98 - 111 mmol/L    Comment: Please note change in reference range.   CO2 27 22 - 32 mmol/L   Glucose, Bld 101 (H) 70 - 99 mg/dL    Comment: Please note change in reference range.   BUN 10 6 - 20 mg/dL    Comment: Please note change in reference range.   Creatinine, Ser 0.91 0.61 - 1.24 mg/dL   Calcium 8.9 8.9 - 10.3 mg/dL   Total Protein 6.9 6.5 - 8.1 g/dL   Albumin 3.7 3.5 - 5.0 g/dL   AST 18 15 - 41 U/L   ALT 21 0 - 44 U/L    Comment: Please note change in reference range.   Alkaline Phosphatase 46 38 - 126 U/L   Total Bilirubin 0.4 0.3 - 1.2 mg/dL   GFR calc non Af Amer >60 >60 mL/min   GFR calc Af Amer >60 >60 mL/min    Comment: (NOTE) The eGFR has been calculated using the CKD EPI equation. This calculation has not been validated in all clinical  situations. eGFR's persistently <60 mL/min signify possible Chronic Kidney Disease.    Anion gap 5 5 - 15    Comment: Performed at [facility] [address], [place] [address]  CBC     Status: None   Collection Time: 11/11/17  3:29 PM  Result Value Ref Range   WBC 4.1 4.0 - 10.5 K/uL   RBC 4.63 4.22 - 5.81 MIL/uL   Hemoglobin 13.1 13.0 - 17.0 g/dL   HCT 41.0 39.0 - 52.0 %   MCV 88.6 78.0 - 100.0 fL   MCH 28.3 26.0 - 34.0 pg   MCHC 32.0 30.0 - 36.0 g/dL   RDW 13.4 11.5 - 15.5 %   Platelets 215 150 - 400 K/uL    Comment: Performed at [facility], [place] [address] [place],  [address]   No results found.  Assessment: 38 y.o. male  with a past medical history of depression, diabetes mellitus, multiple sclerosis diagnosed in August 2010 currently on IV infusion Lemtrada who presents to the ED with complaints of Right upper and lower extremity pain with right leg weakness over the last month but worsened over the last week.    1. Possible MS exacerbation -patient with known long history of multiple sclerosis, was following outpatient with Dr. Lyman Speller at [facility], currently needs IV infusion of Lemtrada.  Patient has progressing symptoms with right thigh right shoulder body pain with mild LHermittes pain, continued blurred and double vision which has been chronic for over a period of time.  Pending MRI brain and MRI cervical spine.  2.  Tobacco abuse 3.  Diabetes mellitus   Plan: -MRI brain and cervical spine with and without contrast pending -Give high-dose IV steroid -  Frequent neuro checks -Follow-up with new neurologist at [facility] for continued infusion of Henry Righter DNP Neuro-hospitalist Team [phone] 11/11/2017, 6:06 PM

## 2017-11-11 NOTE — ED Provider Notes (Signed)
[facility] EMERGENCY DEPARTMENT Provider Note   CSN: [id number] Arrival date & time: 11/11/17  1509     History   Chief Complaint Chief Complaint  Patient presents with  . Generalized Body Aches  . Multiple Sclerosis    HPI [name] is a 37 y.o. male.  HPI   [name] is a 37 y.o. male, with a history of MS, DM, and HTN, presenting to the ED with right upper and lower extremity discomfort "for a while."  Patient states it is been more than a week, but "may be less than a month."  Pain is aching, moderate to severe.  Also endorses right sided weakness.  States he does not like his most recent neurologist, Dr. Terrace Arabia, of [facility]. He has been referred to [facility], but has not yet established care with them yet.   Denies falls/trauma, fever/chills, difficulty breathing, difficulty swallowing, changes in vision, or any other complaints.    Past Medical History:  Diagnosis Date  . Depression   . Diabetes mellitus without complication ([facility])   . Eye abnormalities    unspecified by pt  . Hypertension   . Multiple sclerosis ([facility])   . Weakness    generalized    Patient Active Problem List   Diagnosis Date Noted  . Dysesthesia   . Muscle spasms of neck   . Neuropathic pain   . Vitamin B12 deficiency   . Hypoalbuminemia   . Urinary frequency   . Tobacco abuse   . Steroid-induced hyperglycemia   . Marijuana abuse, continuous 04/12/2017  . Vitamin D deficiency 04/12/2017  . Multiple sclerosis exacerbation (H[facility])   . Abnormality of gait 05/10/2016  . Weakness   . Hypertension   . Depression   . Multiple sclerosis (H[facility]) 03/02/2011    Past Surgical History:  Procedure Laterality Date  . WISDOM TOOTH EXTRACTION          Home Medications    Prior to Admission medications   Medication Sig Start Date End Date Taking? Authorizing Provider  amantadine (SYMMETREL) 100 MG capsule Take 1 capsule (100 mg total) by mouth 2  (two) times daily. Patient not taking: Reported on 11/11/2017 04/25/17   Angiulli, Mcarthur Rossetti, PA-C  cholecalciferol 2000 units TABS Take 1 tablet (2,000 Units total) by mouth daily. Patient not taking: Reported on 11/11/2017 04/18/17   Nyra Market, MD  gabapentin (NEURONTIN) 600 MG tablet Take 0.5 tablets (300 mg total) by mouth 3 (three) times daily as needed (neuropathic pain). Patient not taking: Reported on 11/11/2017 04/25/17   Angiulli, Mcarthur Rossetti, PA-C  methocarbamol (ROBAXIN) 500 MG tablet Take 1 tablet (500 mg total) by mouth every 8 (eight) hours as needed for muscle spasms. Patient not taking: Reported on 11/11/2017 04/25/17   Angiulli, Mcarthur Rossetti, PA-C  nicotine (NICODERM CQ - DOSED IN MG/24 HOURS) 21 mg/24hr patch 21 mg daily 1 week and 14 mg patch daily 3 weeks then 7 mg patch daily 3 weeks and stop Patient not taking: Reported on 11/11/2017 04/25/17   Angiulli, Mcarthur Rossetti, PA-C  vitamin B-12 500 MCG tablet Take 1 tablet (500 mcg total) by mouth daily. Patient not taking: Reported on 11/11/2017 04/25/17   Angiulli, Mcarthur Rossetti, PA-C    Family History Family History  Adopted: Yes  Problem Relation Age of Onset  . Multiple sclerosis Cousin   . Heart disease Mother   . Other Father        Does not know  his father.    Social History Social History   Tobacco Use  . Smoking status: Current Every Day Smoker    Packs/day: 1.00    Types: Cigarettes  . Smokeless tobacco: Never Used  Substance Use Topics  . Alcohol use: Yes    Alcohol/week: 7.2 oz    Types: 12 Cans of beer per week  . Drug use: Yes    Frequency: 30.0 times per week    Types: Marijuana    Comment: Daily     Allergies   Patient has no known allergies.   Review of Systems Review of Systems  Constitutional: Negative for chills and fever.  Respiratory: Negative for shortness of breath.   Cardiovascular: Negative for chest pain.  Gastrointestinal: Negative for abdominal pain, diarrhea, nausea and vomiting.    Musculoskeletal: Positive for myalgias.  Neurological: Positive for weakness. Negative for dizziness, seizures, syncope, light-headedness and headaches.     Physical Exam Updated Vital Signs BP (!) 172/70 (BP Location: Left Arm)   Pulse 80   Temp 99 F (37.2 C) (Oral)   Resp 18   Ht 5\' 10"  (1.778 m)   Wt 77.1 kg (170 lb)   SpO2 100%   BMI 24.39 kg/m   Physical Exam  Constitutional: He is oriented to person, place, and time. He appears well-developed and well-nourished. No distress.  HENT:  Head: Normocephalic and atraumatic.  Mouth/Throat: Oropharynx is clear and moist.  Eyes: Pupils are equal, round, and reactive to light. Conjunctivae and EOM are normal.  Neck: Neck supple.  Cardiovascular: Normal rate, regular rhythm, normal heart sounds and intact distal pulses.  Pulmonary/Chest: Effort normal and breath sounds normal. No respiratory distress.  Abdominal: Soft. There is no tenderness. There is no guarding.  Musculoskeletal: He exhibits no edema.  Lymphadenopathy:    He has no cervical adenopathy.  Neurological: He is alert and oriented to person, place, and time. He exhibits abnormal muscle tone.  Sensation grossly intact in the extremities. No noted speech deficits. No aphasia. Patient handles oral secretions without difficulty. No noted swallowing defects.  Grip strength weaker on the right. Strength strength 4/5 in the right bicep/tricep, 5/5 in the left. Strength 3/5 in the right hip, knee, and ankle. Strength 5/5 in the left lower extremity. Patellar DTRs 2+ bilaterally.  No myoclonus. Patient has difficulty supporting his weight with the right lower extremity.  He does not appear to be stable with ambulation. Cranial nerves III-XII grossly intact.  No facial droop.   Skin: Skin is warm and dry. He is not diaphoretic.  Psychiatric: He has a normal mood and affect. His behavior is normal.  Nursing note and vitals reviewed.    ED Treatments / Results   Labs (all labs ordered are listed, but only abnormal results are displayed) Labs Reviewed  COMPREHENSIVE METABOLIC PANEL - Abnormal; Notable for the following components:      Result Value   Glucose, Bld 101 (*)    All other components within normal limits  URINALYSIS, ROUTINE W REFLEX MICROSCOPIC - Abnormal; Notable for the following components:   Bilirubin Urine SMALL (*)    All other components within normal limits  LIPASE, BLOOD  CBC    EKG None  Radiology No results found.  Procedures .Critical Care Performed by: Anselm Pancoast, PA-C Authorized by: Anselm Pancoast, PA-C   Critical care provider statement:    Critical care time (minutes):  35   Critical care time was exclusive of:  Separately billable procedures  and treating other patients   Critical care was necessary to treat or prevent imminent or life-threatening deterioration of the following conditions: MS flare.   Critical care was time spent personally by me on the following activities:  Development of treatment plan with patient or surrogate, discussions with consultants, examination of patient, obtaining history from patient or surrogate, review of old charts, re-evaluation of patient's condition, pulse oximetry, ordering and review of radiographic studies, ordering and review of laboratory studies and ordering and performing treatments and interventions   I assumed direction of critical care for this patient from another provider in my specialty: no     (including critical care time)  Medications Ordered in ED Medications  methylPREDNISolone sodium succinate (SOLU-MEDROL) 1,000 mg in sodium chloride 0.9 % 50 mL IVPB (1,000 mg Intravenous New Bag/Given 11/11/17 1805)  methylPREDNISolone sodium succinate (SOLU-MEDROL) 1,000 mg in sodium chloride 0.9 % 50 mL IVPB (has no administration in time range)     Initial Impression / Assessment and Plan / ED Course  I have reviewed the triage vital signs and the nursing  notes.  Pertinent labs & imaging results that were available during my care of the patient were reviewed by me and considered in my medical decision making (see chart for details).  Clinical Course as of Nov 11 1833  Wynelle Link Nov 11, 2017  1625 Spoke with Dr. Amada Jupiter, neurologist. States he will come see the patient. Requests MRI brain and c-spine with and without contrast as well as 1g Solu-Medrol.    [SJ]  1800 Dr. Amada Jupiter recommends admission.    [SJ]  1832 Spoke with IM resident. States they will come see and admit the patient.   [SJ]    Clinical Course User Index [SJ] Davinder Haff C, PA-C    Patient presents with right-sided pain and weakness.  Suspicion for MS exacerbation.  Treatment with high-dose steroids and admission with neurology consult. MRIs pending at admission.   Findings and plan of care discussed with Loren Racer, MD.   Final Clinical Impressions(s) / ED Diagnoses   Final diagnoses:  Multiple sclerosis exacerbation [facility])    ED Discharge Orders    None       Concepcion Living 11/11/17 1835    Loren Racer, MD 11/11/17 2316

## 2017-11-11 NOTE — H&P (Addendum)
Date: 11/11/2017               Patient Name:  [name] MRN: [id number]  DOB: [birth year] Age / Sex: 37 y.o., male   PCP: Lonie Peak, PA-C         Medical Service: Internal Medicine Teaching Service         Attending Physician: Dr. Burns Spain, MD    First Contact: Dr. Dortha Schwalbe Pager: [phone]  Second Contact: Dr. Samuella Cota Pager: [phone]       After Hours (After 5p/  First Contact Pager: [phone]  weekends / holidays): Second Contact Pager: [phone]   Chief Complaint: MS  History of Present Illness:  This is a 37 year old male with a history of multiple sclerosis diagnosed in September 2010 presenting with right upper and lower extremity discomfort. He states that he has MS and that it gets bad in the summer due to the heat and humidity. His symptoms include blurry vision, generalized weakness that is worse on the right side, and pain in hips and knees. He also complains of a sharp shoot pain down his back when he bends his neck forward, states that it only occurs when he is standing. This has been going on since summer began but has gotten worse in the past 1.5 weeks. He was trying to wait until his sons birthday on the 19th but he says it just became a "crazy hell". He gets lemtrada infusions for the MS, last one was last year, and he says that he should be due for this years infusions anytime now. He says that he fully recovers between exacerbations, with no weakness or eye symptoms. He denies any double vision but states that it does occur when he gets too hot. Patient denies any fever, headache, n/v, diarrhea, constipation, chest pain or SOB.  The patient was diagnosed with multiple sclerosis in Sept 2010. See's neurologist Dr. Terrace Arabia of [facility] however has been referred to [facility]. Is on IV infusion [name] which he states he last received last year. Per chart review, patient has tried multiple MS medications including Betaseron, Tysabri, and  Rebif over the course of 3 years but they did not improve his symptoms and caused him to feel depressed and suicidal, as well as experience some flushing. He was last hospitalized Dec 12-Apr 17 2017 for generalized weakness and blurry vision. Given Solumedrol x 5 days and got inpatient rehab.   In the ED, patient was afebrile, hypertensive to 172/70, HR 80 and RR of 18. Labs showed normal CBC and CMP. U/A showed no abnormalities. Neurology was consulted and recommended starting high dose steroids and obtaining an MRI. Patient was admitted to internal medicine.   Meds:  No current facility-administered medications on file prior to encounter.    Current Outpatient Medications on File Prior to Encounter  Medication Sig Dispense Refill  . amantadine (SYMMETREL) 100 MG capsule Take 1 capsule (100 mg total) by mouth 2 (two) times daily. (Patient not taking: Reported on 11/11/2017) 60 capsule 0  . cholecalciferol 2000 units TABS Take 1 tablet (2,000 Units total) by mouth daily. (Patient not taking: Reported on 11/11/2017)    . gabapentin (NEURONTIN) 600 MG tablet Take 0.5 tablets (300 mg total) by mouth 3 (three) times daily as needed (neuropathic pain). (Patient not taking: Reported on 11/11/2017) 30 tablet 0  . methocarbamol (ROBAXIN) 500 MG tablet Take 1 tablet (500 mg total) by mouth every 8 (eight) hours as  needed for muscle spasms. (Patient not taking: Reported on 11/11/2017) 30 tablet 0  . nicotine (NICODERM CQ - DOSED IN MG/24 HOURS) 21 mg/24hr patch 21 mg daily 1 week and 14 mg patch daily 3 weeks then 7 mg patch daily 3 weeks and stop (Patient not taking: Reported on 11/11/2017) 28 patch 0  . vitamin B-12 500 MCG tablet Take 1 tablet (500 mcg total) by mouth daily. (Patient not taking: Reported on 11/11/2017) 30 tablet 0    No outpatient medications have been marked as taking for the 11/11/17 encounter [facility] Encounter).     Allergies: Allergies as of 11/11/2017  . (No Known Allergies)    Past Medical History:  Diagnosis Date  . Depression   . Diabetes mellitus without complication ([facility])   . Eye abnormalities    unspecified by pt  . Hypertension   . Multiple sclerosis ([facility])   . Weakness    generalized    Family History: Maternal 1st cousin has MS. Denies any other medical conditions.   Social History: Smokes marijuana whenever he can, estimates 1/4 lb a week. Smokes a little less than 1/2ppd. Denies EtOH or other drug use. Lives with his mom at the moment, separated from wife.   Review of Systems: A complete ROS was negative except as per HPI.   Physical Exam: Blood pressure (!) 172/70, pulse 80, temperature 99 F (37.2 C), temperature source Oral, resp. rate 18, height 5\' 10"  (1.778 m), weight 170 lb (77.1 kg), SpO2 100 %. Physical Exam  Constitutional: He is oriented to person, place, and time and well-developed, well-nourished, and in no distress.  HENT:  Head: Normocephalic and atraumatic.  Eyes: Pupils are equal, round, and reactive to light. Conjunctivae, EOM and lids are normal.  No nystagmus.  Neck: Trachea normal.  Cardiovascular: Normal rate, regular rhythm, normal heart sounds and normal pulses.  Pulmonary/Chest: Effort normal and breath sounds normal.  Abdominal: Soft. Normal appearance and bowel sounds are normal. There is no tenderness.  Musculoskeletal:  Moving all extremities.  Neurological: He is alert and oriented to person, place, and time.  CN: 2-12 grossly intact except for decreased vision bilaterally Motor: 4/5 RUE, 4/5 RLE, 5/5 LUE, 5/5 LLE.  Sensation: Decreased sensation to light touch on, down going toe reflex, RUE and RLE.  Reflexes: 3+ in left patellar reflex, 2+ in right patellar and bilateral biceps. Cerebellar: FTN intact.     Assessment & Plan by Problem: This is a 37 year old male with a history of multiple sclerosis diagnosed in September 2010 presenting with right upper and lower extremity discomfort and admitted for  multiple sclerosis exacerbation.   Multiple sclerosis exacerbation, relapsing-remitting: Patient has been experiencing blurry vision, generalized weakness that is worse on the right, and pain in lower extremities. This has been worse in the summer heat. He has a history of MS and has had similar episodes before in the summer. He is not on any oral medications, and gets Lamtreda infusions. He has tried multiple oral medications before but states that they didn't help and caused him multiple side effects. On exam he was afebrile, hypertensive, had right sided weakness, and decreased sensation to light touch on the right side. Labs were unremarkable. Given his history, symptoms, worsening of symptoms with increased temperature, and exam this is likely a MS exacerbation. In the ED patient seen by neurology who recommended MRI, high dose steroids, and follow up with [facility] for [place].  -MRI brain and cervical spine -Methylprednisolone 1,000  mg daily -Acetaminophen PRN -PT while in hospital  Hypertension: Patient is hypertensive to 172/70 on arrival. Denies taking any medications.  -Continue to monitor vitals -Consider labetalol if SBP >180  Diabetes mellitus: Patient reports that he does not have DM. He does not take any medications at home. And his glucose on admission was 101.  - Will continue to monitor glucose  Tobacco use: Patient reports smoking 1/2 ppd.  -Nicotine patch  FEN: No fluids, replete lytes prn, Carb modified diet  VTE ppx: Lovenox  Code Status: FULL    Dispo: Admit patient to Inpatient with expected length of stay greater than 2 midnights.  Signed: Claudean Severance, MD 11/11/2017, 7:00 PM  Pager: [phone]

## 2017-11-12 ENCOUNTER — Inpatient Hospital Stay (HOSPITAL_COMMUNITY): Payer: Medicare HMO

## 2017-11-12 DIAGNOSIS — G35 Multiple sclerosis: Principal | ICD-10-CM

## 2017-11-12 DIAGNOSIS — T380X5A Adverse effect of glucocorticoids and synthetic analogues, initial encounter: Secondary | ICD-10-CM

## 2017-11-12 DIAGNOSIS — I10 Essential (primary) hypertension: Secondary | ICD-10-CM

## 2017-11-12 DIAGNOSIS — R739 Hyperglycemia, unspecified: Secondary | ICD-10-CM

## 2017-11-12 LAB — HIV ANTIBODY (ROUTINE TESTING W REFLEX): HIV Screen 4th Generation wRfx: NONREACTIVE

## 2017-11-12 LAB — GLUCOSE, CAPILLARY
Glucose-Capillary: 280 mg/dL — ABNORMAL HIGH (ref 70–99)
Glucose-Capillary: 183 mg/dL — ABNORMAL HIGH (ref 70–99)
Glucose-Capillary: 164 mg/dL — ABNORMAL HIGH (ref 70–99)

## 2017-11-12 MED ORDER — SODIUM CHLORIDE 0.9 % IV SOLN
INTRAVENOUS | Status: DC
  Administered 2017-11-12: 10:00:00 via INTRAVENOUS

## 2017-11-12 MED ORDER — RAMELTEON 8 MG PO TABS
8.0000 mg | ORAL_TABLET | Freq: Every day | ORAL | Status: AC
  Administered 2017-11-12: 8 mg via ORAL
  Filled 2017-11-12: qty 1

## 2017-11-12 MED ORDER — NICOTINE 21 MG/24HR TD PT24
21.0000 mg | MEDICATED_PATCH | Freq: Every day | TRANSDERMAL | Status: DC
  Administered 2017-11-12 – 2017-11-13 (×2): 21 mg via TRANSDERMAL
  Filled 2017-11-12 (×3): qty 1

## 2017-11-12 MED ORDER — GADOBENATE DIMEGLUMINE 529 MG/ML IV SOLN
15.0000 mL | Freq: Once | INTRAVENOUS | Status: AC | PRN
  Administered 2017-11-12: 15 mL via INTRAVENOUS

## 2017-11-12 MED ORDER — INSULIN ASPART 100 UNIT/ML ~~LOC~~ SOLN
0.0000 [IU] | Freq: Three times a day (TID) | SUBCUTANEOUS | Status: DC
Start: 2017-11-12 — End: 2017-11-15

## 2017-11-12 MED ORDER — NICOTINE 21 MG/24HR TD PT24
21.0000 mg | MEDICATED_PATCH | Freq: Every day | TRANSDERMAL | Status: DC
  Administered 2017-11-12 – 2017-11-13 (×2): 21 mg via TRANSDERMAL
  Filled 2017-11-12: qty 1

## 2017-11-12 NOTE — Discharge Summary (Signed)
Name: [name] MRN: [id number] DOB: 1980/10/10 37 y.o. PCP: Lonie Peak, PA-C  Date of Admission: 11/11/2017  3:19 PM Date of Discharge: 11/15/17 Attending Physician: Burns Spain, MD  Discharge Diagnosis: 1. Multiple Sclerosis Exacerbation  2. Hypertension 3. Steroid Induced Hyperglycemia  4. Tobacco Use Disorder, Cannabis Use   Discharge Medications: Allergies as of 11/15/2017   No Known Allergies     Medication List    TAKE these medications   amantadine 100 MG capsule Commonly known as:  SYMMETREL Take 1 capsule (100 mg total) by mouth 2 (two) times daily.   Cholecalciferol 2000 units Tabs Take 1 tablet (2,000 Units total) by mouth daily.   gabapentin 600 MG tablet Commonly known as:  NEURONTIN Take 0.5 tablets (300 mg total) by mouth 3 (three) times daily as needed (neuropathic pain).   methocarbamol 500 MG tablet Commonly known as:  ROBAXIN Take 1 tablet (500 mg total) by mouth every 8 (eight) hours as needed for muscle spasms.   nicotine 21 mg/24hr patch Commonly known as:  NICODERM CQ - dosed in mg/24 hours 21 mg daily 1 week and 14 mg patch daily 3 weeks then 7 mg patch daily 3 weeks and stop   vitamin B-12 500 MCG tablet Commonly known as:  CYANOCOBALAMIN Take 1 tablet (500 mcg total) by mouth daily.       Disposition and follow-up:   HenryHenry Grant was discharged from [facility] in [place] condition.  At the hospital follow up visit please address:  1.  Multiple sclerosis exacerbation - Presented with right upper and lower extremity weakness, double vision and LHermittes pain. Received IV Solu-Medrol 1g for 5 days   Requires outpatient follow-up with neurology at [facility]  Arrangements was made for home health and outpatient physical therapy.   2.  Labs / imaging needed at time of follow-up: Glucose monitoring  3.  Pending labs/ test needing follow-up: None   Follow-up Appointments: Follow-up Information     [facility] Follow up.   Specialty:  Home Health Services Why:  Home health services arranged Contact information: [address] [place] [address] [phone]           Hospital Course by problem list: 1. Multiple sclerosis exacerbation: [name] is a 37 year old gentleman with past history significant for multiple sclerosis diagnosed in September 2010 who presented to the emergency department on 11/11/2017 with right upper and lower extremity weakness, LHermittespain, blurry and double vision. He reported of 1 to 2-week history of generalized weakness worse on the right upper and right lower extremity, blurry vision, paresthesia and shooting pain down his spine and extremity with neck flexion.  Symptoms exacerbated with exposure to heat and humidity .  MRI brain and cervical spine was negative for any new active white matter lesions. Neurology was consulted and recommended high dose IV Solu-Medrol for a total of 5 days. He is status post 5 days of of IV Solu-Medrol 1000mg  and reports of resolving LHermittespain and blurry vision with objective improvement with weakness.  During this admission, he was evaluated by PT/OT who recommended Home health with PT and rolling walker with 5" wheels.   [name], received one round of Lemtrada infusion in June 2018 and is due for another round of infusion. He requires follow up with [facility] Neurology for [facility] infusion.  He has missed 3 appointments with [facility] and states he is reluctant to following up.  He was  given an option to follow-up with [facility] neurology and agreed.  A consult was placed for follow-up with [facility] adult neurology.  He is also to have follow-up with outpatient physical therapy and home health.   Of note, [name] was admitted to [facility] in December 2018 with MS flare and received a 5 day course of IV Solu-Medrol with 19 days of inpatient rehab.  2. Hypertension [name] was found  to be hypertensive on admission, however his blood pressure remained stable on subsequent hospital days.   3. Steroid-induced hyperglycemia Mr. [name] had elevated capillary blood glucose and was started on moderate NovoLog sliding scale insulin.  CBG corrected appropriately.  4. Tobacco Use disorder      THC use disorder [name] reported of smoking half a pack of cigarettes per day and 1/4 pound of THC a week. We ordered a nicotine patch 21 mg. He will requires smoking cessation counseling.   Discharge Vitals:   BP (!) 140/97 (BP Location: Left Arm)   Pulse 90   Temp 98.4 F (36.9 C) (Oral)   Resp 16   Ht 5\' 10"  (1.778 m)   Wt 169 lb 15.6 oz (77.1 kg)   SpO2 98%   BMI 24.39 kg/m   Pertinent Labs, Studies, and Procedures:   MRI Brain IMPRESSION: 1. Findings of severe multiple sclerosis with numerous brain and cervical spine white matter lesions in unchanged distribution compared to 04/11/2017. 2. No contrast-enhancing lesions to indicate active demyelination. 3. Advanced brain atrophy for age.  MRI Cervical Spine  IMPRESSION: 1. Findings of severe multiple sclerosis with numerous brain and cervical spine white matter lesions in unchanged distribution compared to 04/11/2017. 2. No contrast-enhancing lesions to indicate active demyelination. 3. Advanced brain atrophy for age.  CBC Latest Ref Rng & Units 11/11/2017 04/24/2017 04/18/2017  WBC 4.0 - 10.5 K/uL 4.1 4.2 3.5(L)  Hemoglobin 13.0 - 17.0 g/dL 40.9 81.1 91.4  Hematocrit 39.0 - 52.0 % 41.0 39.3 41.2  Platelets 150 - 400 K/uL 215 219 209   BMP Latest Ref Rng & Units 11/11/2017 04/24/2017 04/24/2017  Glucose 70 - 99 mg/dL 782(N) 562(Z) -  BUN 6 - 20 mg/dL 10 17 -  Creatinine 3.08 - 1.24 mg/dL 6.57 8.46 9.62  BUN/Creat Ratio 9 - 20 - - -  Sodium 135 - 145 mmol/L 140 138 -  Potassium 3.5 - 5.1 mmol/L 4.3 4.5 -  Chloride 98 - 111 mmol/L 108 106 -  CO2 22 - 32 mmol/L 27 24 -  Calcium 8.9 - 10.3 mg/dL 8.9 9.2 -     Discharge Instructions: Discharge Instructions    Ambulatory referral to Neurology   Complete by:  As directed    To [facility] Neurology. An appointment is requested in approximately: 4   Diet - low sodium heart healthy   Complete by:  As directed    Discharge instructions   Complete by:  As directed    Mr. Alvidrez,  It was a pleasure taking care of you. You were seen because of weakness on your right side. You were treated with steroid for an MS flair. Please follow up with neurology, you will be contacted to schedule this appointment. If you do not hear from them by next week, please give their office a call. Please follow up with your primary care doctor as well, in the next 1-2 weeks for hospital follow up. Thank you!  - Dr. Dortha Schwalbe   Face-to-face encounter (required for Medicare/Medicaid patients)  Complete by:  As directed    I Nyra Market certify that this patient is under my care and that I, or a nurse practitioner or physician's assistant working with me, had a face-to-face encounter that meets the physician face-to-face encounter requirements with this patient on 11/15/2017. The encounter with the patient was in whole, or in part for the following medical condition(s) which is the primary reason for home health care (List medical condition): multiple sclerosis   The encounter with the patient was in whole, or in part, for the following medical condition, which is the primary reason for home health care:  MS   I certify that, based on my findings, the following services are medically necessary home health services:  Physical therapy   Reason for Medically Necessary Home Health Services:   Therapy- Investment banker, operational, Patent examiner Therapy- Instruction on use of Assistive Device for Ambulation on all Surfaces Therapy- Instruction on Safe use of Assistive Devices for ADLs Therapy- Home Adaptation to Facilitate Safety Therapy- Therapeutic Exercises to Increase Strength  and Endurance     My clinical findings support the need for the above services:  Unable to leave home safely without assistance and/or assistive device   Further, I certify that my clinical findings support that this patient is homebound due to:   Unsafe ambulation due to balance issues Unable to leave home safely without assistance     Home Health   Complete by:  As directed    To provide the following care/treatments:   PT OT     Increase activity slowly   Complete by:  As directed       Signed: Yvette Rack, MD 11/15/2017, 4:12 PM   Pager: [phone]

## 2017-11-12 NOTE — Progress Notes (Signed)
Subjective: Hospital day 1  Today, [name] was examined at bedside and continues to endorse right upper and lower extremity weakness.  He denies blurry vision or any other vision changes since admission, headaches, chest pain. He does however repots of neck tightness bilaterally but denies dysphagia.  He indicated to the team of experiencing shooting pain down his extremities and back with flexion of his neck after returning from [place] last year. He reports of intolerance to heat which causes dizziness, lightheadedness and weakness.  He also ruminates on not being treated fairly during his last admission in December 2018.  He remarks such as physicians not knowing how to properly diagnose multiple sclerosis etc.    Objective:  Vital signs in last 24 hours: Vitals:   11/11/17 2050 11/11/17 2357 11/12/17 0954 11/12/17 1317  BP: (!) 162/65 (!) 159/63 129/75 126/71  Pulse: 63 66 64 (!) 57  Resp: 18 18 18    Temp: 98.2 F (36.8 C) 98 F (36.7 C) 98.5 F (36.9 C) 98.3 F (36.8 C)  TempSrc: Oral Oral Oral Oral  SpO2: 100% 100% 100% 98%  Weight: 169 lb 15.6 oz (77.1 kg)     Height: 5\' 10"  (1.778 m)       Physical exams. Constitutional: In no acute distress, sitting in chair by the window HEENT: Atraumatic, normocephalic Cardiovascular: RRR, no murmurs, gallops, rubs Respiratory: CTA BL, no wheezes, crackles, rhonchi Abdomen: Soft, nontender, nondistended Neuro: -Decreased sensation to light touch on the left upper part of his forehead (CN V1) -Strength: RUE (3/5) , RLE (3/5)  -Negative  LHermittes sign  MRI Brain IMPRESSION: 1. Findings of severe multiple sclerosis with numerous brain and cervical spine white matter lesions in unchanged distribution compared to 04/11/2017. 2. No contrast-enhancing lesions to indicate active demyelination. 3. Advanced brain atrophy for age.  MRI Cervical Spine  IMPRESSION: 1. Findings of severe multiple sclerosis with numerous brain  and cervical spine white matter lesions in unchanged distribution compared to 04/11/2017. 2. No contrast-enhancing lesions to indicate active demyelination. 3. Advanced brain atrophy for age.  Assessment/Plan:  Active Problems:   Multiple sclerosis exacerbation [facility])   [name] is a 37 year old gentleman with past history significant for multiple sclerosis diagnosed in September 2010 who presented to the emergency department on 11/11/2017 with right upper and lower extremity weakness, LHermittes pain, blurry and double vision for 1-2 weeks. He seems to be responding appropriately to IV Solu-Medrol.     Multiple sclerosis exacerbation 1 to 2-week history of generalized weakness worse on the right upper and right lower extremity, blurry vision, paresthesia and shooting pain down his spine and extremity with neck flexion.  Symptoms exacerbated with exposure to heat.  MRI brain and cervical spine negative for any new active white matter lesions. He is status post 1 dose of IV Solu-Medrol 1000mg  and reports of resolving LHermittes pain and blurry vision but no improvement with weakness. -Solu-Medrol 1000mg  Marcell.Ricker 1/5] -Pain: Tylenol 1000 mg Q6 prn mild to moderate pain -PT/OT Evaluation: Home health with PT // DME: Rolling walker with 5" wheels  -Requires infusion of Lemtrada after discharge at [facility].  Hypertension Hypertensive on admission, however recent stable BP.  -Continue to monitor  Steroid-induced hyperglycemia CBG today is 280.  Started on moderate NovoLog sliding scale insulin. -Will continue to monitor   Tobacco Use disorder THC use disorder Admits to smoking half a pack of cigarettes per day and 1/4 pound of THC a week.  Requires smoking cessation counseling.  On nicotine patch 21 mg    FEN: Replace electrolytes as needed, carb modified diet DVT prophylaxis: Lovenox 40 mg SQ CODE STATUS: Full code   Dispo: Anticipated discharge in approximately 5  days  Yvette Rack, MD 11/12/2017, 2:07 PM Pager: [phone]

## 2017-11-12 NOTE — Progress Notes (Signed)
Mr. Selmer is getting restless, he has asked to leave the unit twice today with a wheelchair; a nicotine patch was applied earlier today per his request from the MD. Patient is now anxious to get to the bathroom. Assisted to the bathroom with the walker; Mr.Binford proceeds to the shower; fall safety reviewed and MD paged for shower order. Staff remained outside the door while patient showered. Patient returned to bedside.

## 2017-11-12 NOTE — Progress Notes (Signed)
Patient threatening to leave AMA, because he is a heavy daily cannabis smoker and cannot go outside or smoke in his room.

## 2017-11-12 NOTE — Evaluation (Signed)
Physical Therapy Evaluation Patient Details Name: CLIF SERIO MRN: [id number] DOB: 10/15/1980 Today's Date: 11/12/2017   History of Present Illness  37 yo male with exacerbation of MS was admitted and was referred to PT for progressing mobility.  Pt has R side weakness and vision changes at times.  Per imaging his MS is severe with advanced brain atrophy.  PMHx:  DM, double vision in heat, HTN, depression,   Clinical Impression  Pt is getting up to walk with PT after asking to go a long trip down the hall with no AD.  Discouraged this plan, but also got agreement from pt to use RW in his room to increase his focus and safety.  Pt has some ataxic steps on RLE but are well controlled in that confined space.  He did once early on lift the walker to go around a chair unsafely, then wanted to continue to carry it unsafely.  Impulsive behavior will make family assistance necessary, but will ask for SNF if he cannot get that assistance.  Follow acutely for balance and LE strengthening with gentle protocol.      Follow Up Recommendations Home health PT;Supervision for mobility/OOB    Equipment Recommendations  Rolling walker with 5" wheels    Recommendations for Other Services       Precautions / Restrictions Precautions Precautions: Fall Restrictions Weight Bearing Restrictions: No      Mobility  Bed Mobility Overal bed mobility: Needs Assistance Bed Mobility: Supine to Sit     Supine to sit: Supervision        Transfers Overall transfer level: Needs assistance Equipment used: Rolling walker (2 wheeled);1 person hand held assist Transfers: Sit to/from Stand Sit to Stand: Min guard         General transfer comment: pt is unsteady on his initial stand, but can gain control with RW  Ambulation/Gait Ambulation/Gait assistance: Min guard Gait Distance (Feet): 70 Feet Assistive device: Rolling walker (2 wheeled);1 person hand held assist Gait Pattern/deviations: Step-to  pattern;Step-through pattern;Decreased stride length;Wide base of support;Ataxic(tends to overstep on RLE then works in standing to control ) Gait velocity: reduc Gait velocity interpretation: <1.8 ft/sec, indicate of risk for recurrent falls    Stairs            Wheelchair Mobility    Modified Rankin (Stroke Patients Only)       Balance Overall balance assessment: History of Falls;Needs assistance Sitting-balance support: Feet supported;Bilateral upper extremity supported Sitting balance-Leahy Scale: Fair   Postural control: Left lateral lean;Right lateral lean Standing balance support: Bilateral upper extremity supported;During functional activity Standing balance-Leahy Scale: Poor                               Pertinent Vitals/Pain Pain Assessment: No/denies pain    Home Living Family/patient expects to be discharged to:: Private residence Living Arrangements: Children;Parent Available Help at Discharge: Friend(s);Family;Available 24 hours/day Type of Home: House Home Access: Ramped entrance     Home Layout: One level Home Equipment: None Additional Comments: states he is able to get some assistance from his older son     Prior Function Level of Independence: Independent(has fallen mult times in last few weeks)         Comments: Difficult to get accurate baseline     Hand Dominance   Dominant Hand: Right    Extremity/Trunk Assessment   Upper Extremity Assessment Upper Extremity Assessment: Generalized weakness  Lower Extremity Assessment Lower Extremity Assessment: Generalized weakness;RLE deficits/detail RLE Deficits / Details: RLE 3/5 to 3+/5, LLE 4- to 4+ RLE Coordination: decreased fine motor;decreased gross motor    Cervical / Trunk Assessment Cervical / Trunk Assessment: Normal  Communication   Communication: No difficulties  Cognition Arousal/Alertness: Awake/alert Behavior During Therapy: Impulsive;Anxious Overall  Cognitive Status: No family/caregiver present to determine baseline cognitive functioning                                 General Comments: pt has mult requests that are conflicting with one another, poor historian      General Comments      Exercises     Assessment/Plan    PT Assessment Patient needs continued PT services  PT Problem List Decreased strength;Decreased range of motion;Decreased activity tolerance;Decreased balance;Decreased mobility;Decreased coordination;Decreased knowledge of use of DME;Decreased safety awareness;Decreased knowledge of precautions;Decreased skin integrity       PT Treatment Interventions DME instruction;Gait training;Functional mobility training;Therapeutic activities;Therapeutic exercise;Balance training;Neuromuscular re-education;Patient/family education    PT Goals (Current goals can be found in the Care Plan section)  Acute Rehab PT Goals Patient Stated Goal: to go home and not need an AD PT Goal Formulation: With patient Time For Goal Achievement: 11/26/17 Potential to Achieve Goals: Fair    Frequency Min 3X/week   Barriers to discharge Decreased caregiver support home with family to assist him    Co-evaluation               AM-PAC PT "6 Clicks" Daily Activity  Outcome Measure Difficulty turning over in bed (including adjusting bedclothes, sheets and blankets)?: None Difficulty moving from lying on back to sitting on the side of the bed? : A Little Difficulty sitting down on and standing up from a chair with arms (e.g., wheelchair, bedside commode, etc,.)?: Unable Help needed moving to and from a bed to chair (including a wheelchair)?: A Little Help needed walking in hospital room?: A Little Help needed climbing 3-5 steps with a railing? : A Lot 6 Click Score: 16    End of Session Equipment Utilized During Treatment: Gait belt Activity Tolerance: Patient limited by fatigue;Treatment limited secondary to  agitation Patient left: in chair;with call bell/phone within reach;with chair alarm set Nurse Communication: Mobility status PT Visit Diagnosis: Unsteadiness on feet (R26.81);Other abnormalities of gait and mobility (R26.89);Repeated falls (R29.6);Muscle weakness (generalized) (M62.81);History of falling (Z91.81);Ataxic gait (R26.0);Difficulty in walking, not elsewhere classified (R26.2);Hemiplegia and hemiparesis Hemiplegia - Right/Left: Right Hemiplegia - dominant/non-dominant: Dominant Hemiplegia - caused by: (MS symptoms)    Time: 5929-2446 PT Time Calculation (min) (ACUTE ONLY): 28 min   Charges:   PT Evaluation $PT Eval Moderate Complexity: 1 Mod PT Treatments $Gait Training: 8-22 mins   PT G Codes:   PT G-Codes **NOT FOR INPATIENT CLASS** Functional Assessment Tool Used: AM-PAC 6 Clicks Basic Mobility    Ivar Drape 11/12/2017, 12:03 PM   Samul Dada, PT MS Acute Rehab Dept. Number: [facility] [id number] and [facility] [phone]

## 2017-11-12 NOTE — Progress Notes (Signed)
NEURO HOSPITALIST PROGRESS NOTE   Subjective: Initially, Patient awake, alert, up walking with PT using a walker. NAD. Patient up in chair awake and alert during my exam. Patient stated that he feels a lot better today than he did when he first arrived. Weakness has not improved. Patient also stated his frustration without patient neurology and medications, but does understand that he needs treatment in order to stop MS exacerbations so that he can  Be present for his children.   Exam: Vitals:   11/11/17 2050 11/11/17 2357  BP: (!) 162/65 (!) 159/63  Pulse: 63 66  Resp: 18 18  Temp: 98.2 F (36.8 C) 98 F (36.7 C)  SpO2: 100% 100%    Physical Exam   HEENT-  Normocephalic, no lesions, without obvious abnormality.  Normal external eye and conjunctiva.   Cardiovascular-  pulses palpable throughout   Lungs-no excessive working breathing.  Saturations within normal limits Extremities- Warm, dry and intact Musculoskeletal-no joint tenderness, deformity or swelling, right side weakness noted. Skin-warm and dry, no hyperpigmentation, vitiligo, or suspicious lesions   Neuro:  Mental Status: Alert, oriented, thought content appropriate.  Speech fluent without evidence of aphasia.  Able to follow commands without difficulty. Cranial Nerves: II: Visual fields grossly normal,  III,IV, VI: ptosis not present, extra-ocular motions intact bilaterally pupils equal, round, reactive to light and accommodation V,VII: smile symmetric, facial light touch sensation normal bilaterally VIII: hearing normal bilaterally IX,X: uvula rises symmetrically XI: bilateral shoulder shrug XII: midline tongue extension Motor: Right : Upper extremity   4/5    Left:     Upper extremity   5/5  Lower extremity   3/5     Lower extremity   5/5 Tone and bulk:normal tone throughout; no atrophy noted Sensory:  light touch intact throughout, bilaterally Deep Tendon Reflexes: 3+ and symmetric   Patella, 2+ biceps Plantars: Right: downgoing   Left: downgoing Cerebellar: normal finger-to-nose, normal rapid alternating movements and normal heel-to-shin test Gait:  Abnormal gait and station with walker present    Medications:  Scheduled: . enoxaparin (LOVENOX) injection  40 mg Subcutaneous Q24H  . nicotine  21 mg Transdermal Daily   Continuous: . sodium chloride 10 mL/hr at 11/12/17 1012  . methylPREDNISolone (SOLU-MEDROL) injection 1,000 mg (11/12/17 1008)   ZOX:[id number]  Pertinent Labs/Diagnostics:   [name] Contrast  Result Date: 11/12/2017 CLINICAL DATA:  Multiple sclerosis. EXAM: MRI HEAD WITHOUT AND WITH CONTRAST MRI CERVICAL SPINE WITHOUT AND WITH CONTRAST TECHNIQUE: Multiplanar, multiecho pulse sequences of the brain and surrounding structures, and cervical spine, to include the craniocervical junction and cervicothoracic junction, were obtained without and with intravenous contrast. CONTRAST:  15mL MULTIHANCE GADOBENATE DIMEGLUMINE 529 MG/ML IV SOLN COMPARISON:  None. FINDINGS: MRI HEAD FINDINGS BRAIN: There is no acute infarct, acute hemorrhage or mass effect. The midline structures are normal. There are numerous (20 or more) hyperintense T2-weighted signal lesions within the periventricular and juxtacortical white matter, 10-20 of which show corresponding low T1-weighted signal. There are multiple infratentorial lesions, including 7-10 lesions of the brainstem and middle cerebellar peduncles. The number, size and distribution of lesions is unchanged from the most recent MRI. There are no contrast-enhancing lesions to indicate active demyelination. There is age advanced atrophy. Susceptibility-sensitive sequences show no chronic microhemorrhage or superficial siderosis. VASCULAR: Major intracranial arterial and venous sinus flow voids are preserved. SKULL: The visualized  skull base, calvarium, and extracranial soft tissues are normal. SINUSES/ORBITS: No fluid  levels or advanced mucosal thickening. No mastoid or middle ear effusion. The orbits are normal. MRI CERVICAL SPINE FINDINGS Alignment: Normal. Vertebrae: No focal marrow lesion. No compression fracture or evidence of discitis osteomyelitis. Cord: Multiple hyperintense T2-weighted signal lesions of the spinal cord redemonstrated. Dorsal lesion at the C2-3 level is unchanged. Right eccentric lesion at the C4-5 level is also unchanged. Left eccentric lesions at C5 and C6 are also unchanged. No contrast-enhancing lesions. Posterior Fossa, vertebral arteries, paraspinal tissues: No paraspinal abnormality or prevertebral effusion. Disc levels: The C1-2 articulation is normal. The C2-T2 levels are normal. T2-T3 is imaged in the sagittal plane only, with no visible disc herniation or stenosis. IMPRESSION: 1. Findings of severe multiple sclerosis with numerous brain and cervical spine white matter lesions in unchanged distribution compared to 04/11/2017. 2. No contrast-enhancing lesions to indicate active demyelination. 3. Advanced brain atrophy for age. Electronically Signed   By: Deatra Robinson M.D.   On: 11/12/2017 05:09   Mr Cervical Spine W Wo Contrast  Result Date: 11/12/2017  IMPRESSION: 1. Findings of severe multiple sclerosis with numerous brain and cervical spine white matter lesions in unchanged distribution compared to 04/11/2017. 2. No contrast-enhancing lesions to indicate active demyelination. 3. Advanced brain atrophy for age.    Impression: 37 y.o. male with a past medical history of depression, diabetes mellitus, multiple sclerosis diagnosed in August 2010 currently on IV infusion Lemtrada who presents to the ED with complaints of Right upper and lower extremity pain with right leg weakness over the last month but worsened over the last week.   MRI of the brain and C-spine shows extensive lesions consistent with MS but no enhancement. Thoracic spine imaging was not obtained, and could have  enhancing lesions that can explain his symptoms.  1. Possible MS exacerbation -patient with known long history of multiple sclerosis, was following outpatient with Dr. Tacey Heap at [facility], currently needs IV infusion of Lemtrada.  Patient has progressing symptoms with right thigh right shoulder body pain with mild LHermittes pain, continued blurred and double vision which has been chronic for over a period of time.    2.  Tobacco abuse 3.  Diabetes mellitus   Plan: -Give high-dose IV Solu-Medrol for a total of 5 days. - Frequent neuro checks -continue working with PT/ OT  -Follow-up with new neurologist at [facility] for continued infusion of [facility]  Neurology will be available as needed.  Please call with questions.  Valentina Lucks, MSN, NP-C [facility] [phone]  Attending neurologist's note to follow Attending Neurohospitalist Addendum Patient seen and examined with APP/Resident. Agree with the history and physical as documented above. Agree with the plan as documented, which I helped formulate. I have independently reviewed the chart, obtained history, review of systems and examined the patient.I have personally reviewed pertinent head/neck/spine imaging (CT/MRI). Please feel free to call with any questions. --- Milon Dikes, MD [facility] Pager: [phone]  If 7pm to 7am, please call on call as listed on AMION.      11/12/2017, 9:04 AM

## 2017-11-13 LAB — GLUCOSE, CAPILLARY
Glucose-Capillary: 123 mg/dL — ABNORMAL HIGH (ref 70–99)
Glucose-Capillary: 120 mg/dL — ABNORMAL HIGH (ref 70–99)
Glucose-Capillary: 208 mg/dL — ABNORMAL HIGH (ref 70–99)
Glucose-Capillary: 143 mg/dL — ABNORMAL HIGH (ref 70–99)

## 2017-11-13 MED ORDER — RAMELTEON 8 MG PO TABS
8.0000 mg | ORAL_TABLET | Freq: Every day | ORAL | Status: DC
  Administered 2017-11-13 – 2017-11-14 (×2): 8 mg via ORAL
  Filled 2017-11-13 (×3): qty 1

## 2017-11-13 NOTE — Progress Notes (Addendum)
Patient appears anxious and verbalized to RN that he is "ready to get out of here and smoke weed". He stated that "I wish one of my friend would bring me some". RN applied nicotine patch at this time. RN explained s/e of nicotine patch and correct usage. Patient noted honest and open about his usage of "weed" and stated "I don't plan to quit". Emotional support given.   Sim Boast, RN

## 2017-11-13 NOTE — Care Management Note (Signed)
Case Management Note  Patient Details  Name: AYON GIPPLE MRN: [id number] Date of Birth: 1980/11/19  Subjective/Objective:             Spoke w patient. He declines HH services based on previous experiences. He would like to have outpatient therapies. States that he has all DME needed at home already.        Action/Plan:  Will continue to follow for OP therapies.   Expected Discharge Date:                  Expected Discharge Plan:  Home/Self Care  In-House Referral:     Discharge planning Services  CM Consult  Post Acute Care Choice:    Choice offered to:     DME Arranged:    DME Agency:     HH Arranged:    HH Agency:     Status of Service:  In process, will continue to follow  If discussed at Long Length of Stay Meetings, dates discussed:    Additional Comments:  Lawerance Sabal, RN 11/13/2017, 9:27 AM

## 2017-11-13 NOTE — Progress Notes (Signed)
   Subjective: Hospital day 2  Overnight: Patient was refusing nursing orders and to leave AMA.  Today, [name] was evaluated at bedside and denied any complaints.  He reports of improvement of his right lower extremity and right upper extremity weakness since admission.  He denies headaches, blurry vision or any vision changes, chest pain, palpitation, nausea, vomiting.  He endorses good appetite.  On further discussion, he was warned of receiving his full dose of Solu-Medrol.  However, on discussion with the nurse, she assured me that he had not received his full dose of IV Solu-Medrol.  Objective:  Vital signs in last 24 hours: Vitals:   11/12/17 1936 11/13/17 0400 11/13/17 0842 11/13/17 1300  BP: 138/87 130/68 109/81 125/79  Pulse: 77 (!) 55 67 71  Resp: 20 18 16 16   Temp: 98.5 F (36.9 C) 98.2 F (36.8 C) 98.3 F (36.8 C) 98.6 F (37 C)  TempSrc: Oral Oral Oral Oral  SpO2: 100% 100% 100% 100%  Weight:      Height:       Physical exam. Constitutional: No acute distress, continues to regurgitate about multiple sclerosis and not requiring physical therapy. HEENT: Atraumatic normocephalic Cardiovascular.  Normal S1, S2.  No murmurs, gallops, rubs Respiratory: Clear to auscultation bilaterally Abdomen: Soft, nontender, nondistended Neuro:  Right upper extremity strength -4/5 Right lower extremity strength -4/5  Assessment/Plan:  Active Problems:   Multiple sclerosis exacerbation [facility])  [name] is a 37 year old gentleman with past history significant for multiple sclerosis diagnosed in September 2010 who presented to the emergency department on 11/11/2017 with right upper and lower extremity weakness, LHermittespain, blurry and double vision for 1-2 weeks. He seems to be responding appropriately to IV Solu-Medrol 1g with noticeable improvement to weakness.   Multiple sclerosis exacerbation Seems to be responding appropriately to IV Solu-Medrol 1g. Physical exam today  was notable for improved right upper and lower strength (4/5 today from 3/5 yesterday).  -Solu-Medrol 1000mg  Henry.Grant 3/5] -Pain: Tylenol 1000 mg Q6 prn mild to moderate pain -Denies home health but agree to outpatient physical therapy. -Infusion of Lemtrada after discharge at [facility]  Hypertension Blood pressure stable.   Steroid-induced hyperglycemia Started on moderate level sliding scale insulin.  CBG this morning of 120.  Responding appropriately -Continue to monitor  Tobacco Use disorder THC use disorder Nicotine patch 21 mg applied.  Per discussion with nurse, he has made several attempts to leave AMA to smoke outside of the hospital since [facility] is a tobacco free campus.     FEN: Replace electrolytes as needed, carb modified diet DVT prophylaxis: Lovenox 40 mg SQ CODE STATUS: Full code  Dispo: Anticipated discharge in approximately 2 day(s).   Henry Rack, MD 11/13/2017, 3:28 PM Pager: [phone]

## 2017-11-13 NOTE — Progress Notes (Signed)
RN returned to saline lock patient's IV after solumedrol dose signal completed. Patient point to primary NS IV bag stating 'I don't think I get all my medication" RN then referred to his secondary IVP of solumedrol and show pt that it is completed. He then voice concern that he did not get all his med due to medication still remains in his IV tubing. RN attempted to clear the IV tubing so patient can get all his medication as requested. RN also explained the nature of IV pump. Patient still appears anxious and wanting to know why this is the process and go on about his other visit with his PCP. Charge nurse aware of situation. Rounding team aware and MD spoke to patient at this time.   Sim Boast, RN

## 2017-11-13 NOTE — Progress Notes (Signed)
Patient noncompliant with bed alarm....redirected with safety measures pt not willing to listen to nurse.  Patient still getting up unattended with bed alarming and walking unassisted around room to get things out of his bag, which pt stated earlier, "I can vape my liquid marijuana and you people would never even now."  Patient refusing to use steady or walker despite being high fall risk and falling over 3 times the day of admission.  Patient still not following unit rules and showing poor safety judgement.  Bed alarm turned on.

## 2017-11-13 NOTE — Progress Notes (Signed)
  Date: 11/13/2017  Patient name: [name]  Medical record number: [id number]  Date of birth: 1981/03/07   I have seen and evaluated this patient and I have discussed the plan of care with the house staff. Please see their note for complete details. I concur with their findings with the following additions/corrections: RISHIK TUBBY was seen on AM rounds with team. He was sitting in bed and is concerned that he did not get full dose of steroids today. Dr Dortha Schwalbe is talking with his RN. He notes a good appetite and improve strength in his R leg. Today is day #3 of steroids for total of 5 days. He will need to sch F/U at [facility], MD 11/13/2017, 3:35 PM

## 2017-11-14 LAB — GLUCOSE, CAPILLARY
GLUCOSE-CAPILLARY: 110 mg/dL — AB (ref 70–99)
GLUCOSE-CAPILLARY: 113 mg/dL — AB (ref 70–99)
GLUCOSE-CAPILLARY: 185 mg/dL — AB (ref 70–99)
Glucose-Capillary: 183 mg/dL — ABNORMAL HIGH (ref 70–99)

## 2017-11-14 MED ORDER — NICOTINE 21 MG/24HR TD PT24
21.0000 mg | MEDICATED_PATCH | Freq: Every day | TRANSDERMAL | Status: DC
  Administered 2017-11-14 – 2017-11-15 (×2): 21 mg via TRANSDERMAL
  Filled 2017-11-14 (×2): qty 1

## 2017-11-14 NOTE — Progress Notes (Signed)
Patient request to go outside at this time. RN paged MD per request. Awaiting for call back.   Sim Boast, RN

## 2017-11-14 NOTE — Progress Notes (Signed)
   Subjective: Hospital day 3  Today, Mr. Bunton was evaluated at bedside.  His only complaint was experiencing hiccups last night.  Today he reports that he is doing well.  His strength is improving and has a really good appetite.  He denies any vision changes, headaches, chest pain, palpitation.  Per patient he has missed 3 appointments at [facility] and states that he would not like to get further treatment at [facility].  We discussed the option of finding a neurologist at [facility] and he agreed.    Objective:  Vital signs in last 24 hours: Vitals:   11/13/17 2011 11/13/17 2318 11/14/17 0309 11/14/17 0743  BP: 132/74 (!) 144/86 137/69 (!) 147/77  Pulse: 76 (!) 53 (!) 51 (!) 57  Resp: 20 20 20 20   Temp: 98.8 F (37.1 C) 98.1 F (36.7 C) 98.2 F (36.8 C) 98.3 F (36.8 C)  TempSrc: Oral Oral Oral Oral  SpO2: 98% 99% 100% 100%  Weight:      Height:       Physical exam Constitutional:  In no acute distress, lying in bed pleasantly Neurological: -Alert -Strength: Able to bend knee and flex hip against gravity -RUE: 4/5 -LUE: 4/5  Assessment/Plan:  Active Problems:   Multiple sclerosis exacerbation [facility])  Mr. Masur is a 37 year old gentleman with past history significant for multiple sclerosis diagnosed in September 2010 who presented to the emergency department on 11/11/2017 with right upper and lower extremity weakness,LHermittespain, blurry and double visionfor 1-2 weeks. He is responding appropriately to IV Solu-Medrol 1g with noticeable improvement to weakness.  His strength on the right upper and lower extremity is at 4/5 today which seems to be his baseline.   Multiple sclerosis exacerbation He is responded really well to IV Solu-Medrol 1 g. On assessment, he was able to bend his knee and flex his hip against gravity indicating significant improvement since admission will receive his 4th dose today.  He would like to have a neurology follow-up at [facility].  Referral to [facility]  adult neurology has been placed and upon speaking directly with the clinic receptionist, she indicated the closest appointment is in September/October with Dr. Janeann Merl -Solu-Medrol 1000mg  [ZOX 3/5] -Pain: Tylenol 1000 mgQ6 prn mild to moderate pain  Hypertension -BP stable  -Hold meds  Steroid-induced hyperglycemia CBG this morning 120.  We will continue moderate SSI.    FEN: Replace electrolytes as needed, carb modified diet DVT prophylaxis: Lovenox 40 mg SQ CODE STATUS:Full code  Dispo: Anticipated discharge in approximately 1 day.    Yvette Rack, MD 11/14/2017, 11:19 AM Pager: [phone]

## 2017-11-14 NOTE — Care Management Important Message (Signed)
Important Message  Patient Details  Name: [name] MRN: [id number] Date of Birth: [birth year]   Medicare Important Message Given:  Yes    Lonny Eisen Stefan Church 11/14/2017, 2:53 PM

## 2017-11-14 NOTE — Progress Notes (Signed)
Physical Therapy Treatment Patient Details Name: [name] MRN: [id number] DOB: 1980/08/11 Today's Date: 11/14/2017    History of Present Illness 37 yo male with exacerbation of MS was admitted and was referred to PT for progressing mobility.  Pt has R side weakness and vision changes at times.  Per imaging his MS is severe with advanced brain atrophy.  PMHx:  DM, double vision in heat, HTN, depression,     PT Comments    Patient is making progress toward PT goals. Pt tangential throughout session and easily distracted by environment. Pt requires at least single UE support for dynamic activities. Continue to progress as tolerated.    Follow Up Recommendations  Home health PT;Supervision for mobility/OOB     Equipment Recommendations  Rolling walker with 5" wheels    Recommendations for Other Services       Precautions / Restrictions Precautions Precautions: Fall Restrictions Weight Bearing Restrictions: No    Mobility  Bed Mobility               General bed mobility comments: pt sitting EOB upon arrival   Transfers Overall transfer level: Needs assistance Equipment used: 1 person hand held assist Transfers: Sit to/from Stand Sit to Stand: Min guard         General transfer comment: pt reliant on single UE support upon standing   Ambulation/Gait Ambulation/Gait assistance: Min assist Gait Distance (Feet): 80 Feet Assistive device: 1 person hand held assist(rail in hallway ) Gait Pattern/deviations: Step-through pattern;Decreased stride length;Wide base of support;Ataxic;Staggering left;Staggering right Gait velocity: decreased   General Gait Details: pt requires assistance for balance and at least single UE support; pt declined use of AD    Stairs             Wheelchair Mobility    Modified Rankin (Stroke Patients Only)       Balance Overall balance assessment: History of Falls;Needs assistance Sitting-balance support: Feet supported;No  upper extremity supported Sitting balance-Leahy Scale: Good     Standing balance support: During functional activity;Single extremity supported Standing balance-Leahy Scale: Poor Standing balance comment: pt able to static stand at sink for hand hygiene without UE support but reliant on UE support for dynamic activity                            Cognition Arousal/Alertness: Awake/alert Behavior During Therapy: WFL for tasks assessed/performed Overall Cognitive Status: No family/caregiver present to determine baseline cognitive functioning                                 General Comments: pt tangential throughout session and very much focused on previous hospital admissions       Exercises      General Comments        Pertinent Vitals/Pain Pain Assessment: Faces Faces Pain Scale: Hurts a little bit Pain Location: R LE Pain Descriptors / Indicators: Sore Pain Intervention(s): Monitored during session    Home Living                      Prior Function            PT Goals (current goals can now be found in the care plan section) Acute Rehab PT Goals Patient Stated Goal: to go home and not need an AD PT Goal Formulation: With patient Time For Goal Achievement: 11/26/17  Potential to Achieve Goals: Fair Progress towards PT goals: Progressing toward goals    Frequency    Min 3X/week      PT Plan Current plan remains appropriate    Co-evaluation              AM-PAC PT "6 Clicks" Daily Activity  Outcome Measure  Difficulty turning over in bed (including adjusting bedclothes, sheets and blankets)?: None Difficulty moving from lying on back to sitting on the side of the bed? : A Little Difficulty sitting down on and standing up from a chair with arms (e.g., wheelchair, bedside commode, etc,.)?: Unable Help needed moving to and from a bed to chair (including a wheelchair)?: A Little Help needed walking in hospital room?: A  Little Help needed climbing 3-5 steps with a railing? : A Lot 6 Click Score: 16    End of Session Equipment Utilized During Treatment: Gait belt Activity Tolerance: Patient tolerated treatment well Patient left: with nursing/sitter in room;Other (comment)(pt in transport w/c and RN took over care of pt) Nurse Communication: Mobility status PT Visit Diagnosis: Unsteadiness on feet (R26.81);Other abnormalities of gait and mobility (R26.89);Repeated falls (R29.6);Muscle weakness (generalized) (M62.81);History of falling (Z91.81);Ataxic gait (R26.0);Difficulty in walking, not elsewhere classified (R26.2);Hemiplegia and hemiparesis Hemiplegia - Right/Left: Right Hemiplegia - dominant/non-dominant: Dominant Hemiplegia - caused by: (MS symptoms)     Time: 6045-4098 PT Time Calculation (min) (ACUTE ONLY): 23 min  Charges:  $Gait Training: 8-22 mins $Therapeutic Activity: 8-22 mins                    G Codes:       Erline Levine, PTA Pager: [phone]     Carolynne Edouard 11/14/2017, 3:25 PM

## 2017-11-15 LAB — GLUCOSE, CAPILLARY
GLUCOSE-CAPILLARY: 110 mg/dL — AB (ref 70–99)
GLUCOSE-CAPILLARY: 107 mg/dL — AB (ref 70–99)
Glucose-Capillary: 205 mg/dL — ABNORMAL HIGH (ref 70–99)

## 2017-11-15 NOTE — Progress Notes (Signed)
Discharge instructions reviewed with patient. All questions answered. RN advise pt to keep his appointments with outpatient service. Pt is waiting for mom to transport home.  Sim Boast, RN

## 2017-11-15 NOTE — Progress Notes (Signed)
   Subjective: Hospital day 4  Today, [name] was examined at bedside.  He denies any complaints.  He reports good appetite and working with physical therapy.  He denies chest pain, shortness of breath, palpitation, vision changes, abdominal pain.   Objective:  Vital signs in last 24 hours: Vitals:   11/14/17 2031 11/15/17 0006 11/15/17 0344 11/15/17 0836  BP: 137/84 129/73 122/71 114/82  Pulse: 66 65 66 89  Resp: 18 18 18 18   Temp: 98.1 F (36.7 C) 98 F (36.7 C) 98.3 F (36.8 C) 98.4 F (36.9 C)  TempSrc: Oral Oral Oral Oral  SpO2: 98% 98% 98% 99%  Weight:      Height:       Physical exams Constitutional: In no distress Neurological: Strength has improved since admission.  His right upper extremity and lower extremity strength measures at a 4 out of 5 today  Assessment/Plan:  Active Problems:   Multiple sclerosis exacerbation [facility])   [name] is a 37 year old gentleman with past history significant for multiple sclerosis diagnosed in September 2010 currently being treated for multiple sclerosis exacerbation.  He has received a total of 5 days of IV Solu-Medrol 1 g with noticeable improvement to weakness.  Multiple sclerosis exacerbation [name] has received a total of 5 days of IV Solu-Medrol 1 g with significant improvement with  strength.  He has had close follow-up and assessment with physical therapy who recommended  home health with physical therapy.  Patient opted for home health with physical therapy.  In addition, a consult has been placed for patient to see a neurologist at [facility] and she would receive a call in the next 1 to 2 weeks. -Safe to discharge home with follow-up at [facility] -Outpatient physical therapy   Steroid-induced hyperglycemia As per history, CBGs have been regularly monitored and have been stable.   Dispo: Anticipated discharge today.  Yvette Rack, MD 11/15/2017, 11:17 AM Pager: [phone]

## 2017-11-15 NOTE — Progress Notes (Signed)
Patient with discharge orders at this time and appears very upset wanting to know why he is being discharge. PT continue to refers back to his old admission and length of stay. RN paged MD, both I and Dr. Dortha Schwalbe at bedside at this time. Pt is now agreeable with MD explanation and discharge instruction.  CM paged for [facility] service set up.   Sim Boast, RN

## 2017-11-15 NOTE — Care Management Note (Signed)
Case Management Note  Patient Details  Name: [name] MRN: [id number] Date of Birth: Sep 08, 1980  Subjective/Objective:              MS exacerbation      Action/Plan: Transition to home with home health services. Pt will d/c to mom's home : [address] [place], [place] [address]. # [id number]. Pt states will have transportation to home.  Expected Discharge Date:  11/14/17               Expected Discharge Plan:  Home w Home Health Services  In-House Referral:     Discharge planning Services  CM Consult  Post Acute Care Choice:    Choice offered to:  Patient  DME Arranged:   N/A DME Agency:   N/A  HH Arranged:  PT, OT HH Agency:  [facility], referral made with Enrique Sack,  fax # [phone].  Status of Service:  Completed, signed off  If discussed at Long Length of Stay Meetings, dates discussed:    Additional Comments:  Epifanio Lesches, RN 11/15/2017, 11:41 AM

## 2017-11-16 DIAGNOSIS — M25561 Pain in right knee: Secondary | ICD-10-CM | POA: Diagnosis not present

## 2017-11-16 DIAGNOSIS — M25551 Pain in right hip: Secondary | ICD-10-CM | POA: Diagnosis not present

## 2017-11-16 DIAGNOSIS — M25562 Pain in left knee: Secondary | ICD-10-CM | POA: Diagnosis not present

## 2017-11-16 DIAGNOSIS — H538 Other visual disturbances: Secondary | ICD-10-CM | POA: Diagnosis not present

## 2017-11-16 DIAGNOSIS — I10 Essential (primary) hypertension: Secondary | ICD-10-CM | POA: Diagnosis not present

## 2017-11-16 DIAGNOSIS — R45850 Homicidal ideations: Secondary | ICD-10-CM | POA: Diagnosis not present

## 2017-11-16 DIAGNOSIS — T380X5D Adverse effect of glucocorticoids and synthetic analogues, subsequent encounter: Secondary | ICD-10-CM | POA: Diagnosis not present

## 2017-11-16 DIAGNOSIS — M25552 Pain in left hip: Secondary | ICD-10-CM | POA: Diagnosis not present

## 2017-11-16 DIAGNOSIS — R69 Illness, unspecified: Secondary | ICD-10-CM | POA: Diagnosis not present

## 2017-11-16 DIAGNOSIS — R739 Hyperglycemia, unspecified: Secondary | ICD-10-CM | POA: Diagnosis not present

## 2017-11-16 DIAGNOSIS — G35 Multiple sclerosis: Secondary | ICD-10-CM | POA: Diagnosis not present

## 2017-11-17 ENCOUNTER — Encounter (HOSPITAL_COMMUNITY): Payer: Self-pay | Admitting: Emergency Medicine

## 2017-11-17 ENCOUNTER — Emergency Department (HOSPITAL_COMMUNITY)
Admission: EM | Admit: 2017-11-17 | Discharge: 2017-11-17 | Disposition: A | Discharge status: Home / Self Care | Payer: Medicare HMO | Attending: Emergency Medicine | Admitting: Emergency Medicine

## 2017-11-17 ENCOUNTER — Emergency Department (HOSPITAL_COMMUNITY): Payer: Medicare HMO

## 2017-11-17 DIAGNOSIS — I10 Essential (primary) hypertension: Secondary | ICD-10-CM | POA: Insufficient documentation

## 2017-11-17 DIAGNOSIS — R918 Other nonspecific abnormal finding of lung field: Secondary | ICD-10-CM | POA: Diagnosis not present

## 2017-11-17 DIAGNOSIS — E119 Type 2 diabetes mellitus without complications: Secondary | ICD-10-CM | POA: Insufficient documentation

## 2017-11-17 DIAGNOSIS — F17210 Nicotine dependence, cigarettes, uncomplicated: Secondary | ICD-10-CM | POA: Insufficient documentation

## 2017-11-17 DIAGNOSIS — R42 Dizziness and giddiness: Secondary | ICD-10-CM | POA: Diagnosis not present

## 2017-11-17 DIAGNOSIS — M7918 Myalgia, other site: Secondary | ICD-10-CM | POA: Diagnosis present

## 2017-11-17 DIAGNOSIS — G35 Multiple sclerosis: Secondary | ICD-10-CM | POA: Diagnosis not present

## 2017-11-17 DIAGNOSIS — M62838 Other muscle spasm: Secondary | ICD-10-CM

## 2017-11-17 DIAGNOSIS — R45850 Homicidal ideations: Secondary | ICD-10-CM

## 2017-11-17 DIAGNOSIS — Z79899 Other long term (current) drug therapy: Secondary | ICD-10-CM | POA: Diagnosis not present

## 2017-11-17 DIAGNOSIS — R69 Illness, unspecified: Secondary | ICD-10-CM | POA: Diagnosis not present

## 2017-11-17 DIAGNOSIS — R531 Weakness: Secondary | ICD-10-CM | POA: Diagnosis not present

## 2017-11-17 LAB — COMPREHENSIVE METABOLIC PANEL
ALBUMIN: 3.3 g/dL — AB (ref 3.5–5.0)
ALT: 25 U/L (ref 0–44)
AST: 16 U/L (ref 15–41)
Alkaline Phosphatase: 35 U/L — ABNORMAL LOW (ref 38–126)
Anion gap: 8 (ref 5–15)
BUN: 19 mg/dL (ref 6–20)
CHLORIDE: 108 mmol/L (ref 98–111)
CO2: 26 mmol/L (ref 22–32)
CREATININE: 0.86 mg/dL (ref 0.61–1.24)
Calcium: 8.6 mg/dL — ABNORMAL LOW (ref 8.9–10.3)
GFR calc Af Amer: >60 mL/min (ref >60)
GFR calc non Af Amer: >60 mL/min (ref >60)
GLUCOSE: 88 mg/dL (ref 70–99)
Potassium: 3.7 mmol/L (ref 3.5–5.1)
SODIUM: 142 mmol/L (ref 135–145)
Total Bilirubin: 0.7 mg/dL (ref 0.3–1.2)
Total Protein: 5.9 g/dL — ABNORMAL LOW (ref 6.5–8.1)

## 2017-11-17 LAB — RAPID URINE DRUG SCREEN, HOSP PERFORMED
AMPHETAMINES: NOT DETECTED
BENZODIAZEPINES: NOT DETECTED
Barbiturates: NOT DETECTED
Cocaine: NOT DETECTED
Opiates: NOT DETECTED
TETRAHYDROCANNABINOL: POSITIVE — AB

## 2017-11-17 LAB — URINALYSIS, ROUTINE W REFLEX MICROSCOPIC
BILIRUBIN URINE: NEGATIVE
Glucose, UA: NEGATIVE mg/dL
HGB URINE DIPSTICK: NEGATIVE
KETONES UR: NEGATIVE mg/dL
Leukocytes, UA: NEGATIVE
NITRITE: NEGATIVE
PH: 5 (ref 5.0–8.0)
Protein, ur: NEGATIVE mg/dL
Specific Gravity, Urine: 1.029 (ref 1.005–1.030)

## 2017-11-17 LAB — CK: Total CK: 50 U/L (ref 49–397)

## 2017-11-17 LAB — ETHANOL: Alcohol, Ethyl (B): <10 mg/dL (ref <10)

## 2017-11-17 LAB — CBC
HCT: 38.5 % — ABNORMAL LOW (ref 39.0–52.0)
HEMOGLOBIN: 12.6 g/dL — AB (ref 13.0–17.0)
MCH: 28.5 pg (ref 26.0–34.0)
MCHC: 32.7 g/dL (ref 30.0–36.0)
MCV: 87.1 fL (ref 78.0–100.0)
Platelets: 187 10*3/uL (ref 150–400)
RBC: 4.42 MIL/uL (ref 4.22–5.81)
RDW: 13.2 % (ref 11.5–15.5)
WBC: 4 10*3/uL (ref 4.0–10.5)

## 2017-11-17 LAB — ACETAMINOPHEN LEVEL: Acetaminophen (Tylenol), Serum: <10 ug/mL — ABNORMAL LOW (ref 10–30)

## 2017-11-17 LAB — SALICYLATE LEVEL: Salicylate Lvl: <7 mg/dL (ref 2.8–30.0)

## 2017-11-17 MED ORDER — IBUPROFEN 800 MG PO TABS
800.0000 mg | ORAL_TABLET | Freq: Once | ORAL | Status: AC
  Administered 2017-11-17: 800 mg via ORAL
  Filled 2017-11-17: qty 1

## 2017-11-17 MED ORDER — ACETAMINOPHEN 325 MG PO TABS
650.0000 mg | ORAL_TABLET | Freq: Once | ORAL | Status: AC
  Administered 2017-11-17: 650 mg via ORAL
  Filled 2017-11-17: qty 2

## 2017-11-17 MED ORDER — DIAZEPAM 5 MG/ML IJ SOLN
2.5000 mg | Freq: Once | INTRAMUSCULAR | Status: AC
  Administered 2017-11-17: 2.5 mg via INTRAVENOUS
  Filled 2017-11-17: qty 2

## 2017-11-17 MED ORDER — CYCLOBENZAPRINE HCL 10 MG PO TABS: 10.0000 mg | ORAL_TABLET | Freq: Three times a day (TID) | ORAL | 0 refills | Status: DC | PRN

## 2017-11-17 NOTE — ED Notes (Signed)
  Spoke with [facility] about sitter coverage.  Will try to find me a sitter.

## 2017-11-17 NOTE — ED Triage Notes (Signed)
  Patient BIB [facility] EMS called out for generalized body pain due to MS.  While in transport patient started getting upset and saying he wanted to kill some of his family.  Patient states that he will "cut off someones head if they try to hurt him or his kids" including hospital staff.  Patient states he has pocket knife in belongings and those have been removed from the room.  Patient is rambling about family issues and hard to settle down.

## 2017-11-17 NOTE — ED Notes (Signed)
Admitting providers at bedside

## 2017-11-17 NOTE — ED Notes (Signed)
  Patient has been wanded, placed in purple scrubs, and belongings removed from room.    Went in with Dr. Manus Gunning to assess and patient was apologetic for his anger earlier towards staff.  Stated that he did not want to hurt anyone but was using that as a method to express his pain level.  MD wanted no TTS at this time and will follow up with neurology.

## 2017-11-17 NOTE — ED Notes (Signed)
  Patient unable to ambulate.  Attempted to use urinal standing up but could not.

## 2017-11-17 NOTE — ED Provider Notes (Signed)
[facility] EMERGENCY DEPARTMENT Provider Note   CSN: [id number] Arrival date & time: 11/17/17  0035     History   Chief Complaint Chief Complaint  Patient presents with  . Generalized Body Aches  . Homicidal    HPI [name] is a 37 y.o. male.  [name] is an 37 y.o. male with a past medical history of depression, diabetes mellitus, multiple sclerosis diagnosed in August 2010 currently on IV infusion [place].  Patient recently admitted to the hospital from July 14 to the 18th with MS exacerbation and completed 5 days of steroids.  He called EMS today because he felt he is not any better and getting worse with right-sided weakness.  He is thinking that he needed to be in the hospital longer.  He complains of pain everywhere as well as weakness on his right side.  He apparently told EMS that he was having thoughts of wanting to hurt someone which she denies to me.  He denies any suicidal thoughts.  He states the amount of pain he is and is causing him to make statements about hurting other people which are not true.  He denies any fevers, chills, nausea or vomiting.  No chest pain or shortness of breath.  The history is provided by the patient and the EMS personnel.    Past Medical History:  Diagnosis Date  . Depression   . Diabetes mellitus without complication (HCC)   . Eye abnormalities    unspecified by pt  . Hypertension   . Multiple sclerosis (HCC)   . Weakness    generalized    Patient Active Problem List   Diagnosis Date Noted  . Dysesthesia   . Muscle spasms of neck   . Neuropathic pain   . Vitamin B12 deficiency   . Hypoalbuminemia   . Urinary frequency   . Tobacco abuse   . Steroid-induced hyperglycemia   . Marijuana abuse, continuous 04/12/2017  . Vitamin D deficiency 04/12/2017  . Multiple sclerosis exacerbation (HCC)   . Abnormality of gait 05/10/2016  . Weakness   . Hypertension   . Depression   . Multiple sclerosis (HCC)  03/02/2011    Past Surgical History:  Procedure Laterality Date  . WISDOM TOOTH EXTRACTION          Home Medications    Prior to Admission medications   Medication Sig Start Date End Date Taking? Authorizing Provider  amantadine (SYMMETREL) 100 MG capsule Take 1 capsule (100 mg total) by mouth 2 (two) times daily. Patient not taking: Reported on 11/11/2017 04/25/17   Angiulli, Mcarthur Rossetti, PA-C  cholecalciferol 2000 units TABS Take 1 tablet (2,000 Units total) by mouth daily. Patient not taking: Reported on 11/11/2017 04/18/17   Nyra Market, MD  gabapentin (NEURONTIN) 600 MG tablet Take 0.5 tablets (300 mg total) by mouth 3 (three) times daily as needed (neuropathic pain). Patient not taking: Reported on 11/11/2017 04/25/17   Angiulli, Mcarthur Rossetti, PA-C  methocarbamol (ROBAXIN) 500 MG tablet Take 1 tablet (500 mg total) by mouth every 8 (eight) hours as needed for muscle spasms. Patient not taking: Reported on 11/11/2017 04/25/17   Angiulli, Mcarthur Rossetti, PA-C  nicotine (NICODERM CQ - DOSED IN MG/24 HOURS) 21 mg/24hr patch 21 mg daily 1 week and 14 mg patch daily 3 weeks then 7 mg patch daily 3 weeks and stop Patient not taking: Reported on 11/11/2017 04/25/17   Angiulli, Mcarthur Rossetti, PA-C  vitamin B-12 500 MCG tablet Take 1  tablet (500 mcg total) by mouth daily. Patient not taking: Reported on 11/11/2017 04/25/17   Angiulli, Mcarthur Rossetti, PA-C    Family History Family History  Adopted: Yes  Problem Relation Age of Onset  . Multiple sclerosis Cousin   . Heart disease Mother   . Other Father        Does not know his father.    Social History Social History   Tobacco Use  . Smoking status: Current Every Day Smoker    Packs/day: 1.00    Types: Cigarettes  . Smokeless tobacco: Never Used  Substance Use Topics  . Alcohol use: Yes    Alcohol/week: 7.2 oz    Types: 12 Cans of beer per week  . Drug use: Yes    Frequency: 30.0 times per week    Types: Marijuana    Comment: Daily      Allergies   Patient has no known allergies.   Review of Systems Review of Systems  Constitutional: Negative for activity change, appetite change and fever.  Eyes: Negative for visual disturbance.  Respiratory: Negative for cough, chest tightness and shortness of breath.   Cardiovascular: Negative for chest pain.  Gastrointestinal: Negative for abdominal pain, nausea and vomiting.  Genitourinary: Negative for testicular pain.  Musculoskeletal: Negative for arthralgias, gait problem and myalgias.  Skin: Negative for rash.  Neurological: Positive for dizziness, weakness and numbness. Negative for facial asymmetry and headaches.  Psychiatric/Behavioral: Negative for confusion.   all other systems are negative except as noted in the HPI and PMH.     Physical Exam Updated Vital Signs BP 135/66 (BP Location: Right Arm)   Pulse 89   Temp 98 F (36.7 C) (Oral)   Resp 16   SpO2 98%   Physical Exam  Constitutional: He is oriented to person, place, and time. He appears well-developed and well-nourished. No distress.  Pressured speech, flight of ideas  HENT:  Head: Normocephalic and atraumatic.  Mouth/Throat: Oropharynx is clear and moist. No oropharyngeal exudate.  Eyes: Pupils are equal, round, and reactive to light. Conjunctivae and EOM are normal.  Neck: Normal range of motion. Neck supple.  No meningismus.  Cardiovascular: Normal rate, regular rhythm, normal heart sounds and intact distal pulses.  No murmur heard. Intact DP and PT pulses.  Pulmonary/Chest: Effort normal and breath sounds normal. No respiratory distress.  Abdominal: Soft. There is no tenderness. There is no rebound and no guarding.  Musculoskeletal: Normal range of motion. He exhibits no edema or tenderness.  Neurological: He is alert and oriented to person, place, and time. No cranial nerve deficit. He exhibits normal muscle tone. Coordination normal.  Patient moving his left side well.  There is 4/5  strength of right upper extremity and right lower extremity No facial droop. Decreased grip strength on the right.  Patient unable to stand independently.   Skin: Skin is warm.  Psychiatric: He has a normal mood and affect. His behavior is normal.  Nursing note and vitals reviewed.    ED Treatments / Results  Labs (all labs ordered are listed, but only abnormal results are displayed) Labs Reviewed  COMPREHENSIVE METABOLIC PANEL - Abnormal; Notable for the following components:      Result Value   Calcium 8.6 (*)    Total Protein 5.9 (*)    Albumin 3.3 (*)    Alkaline Phosphatase 35 (*)    All other components within normal limits  ACETAMINOPHEN LEVEL - Abnormal; Notable for the following components:   Acetaminophen (  Tylenol), Serum <10 (*)    All other components within normal limits  CBC - Abnormal; Notable for the following components:   Hemoglobin 12.6 (*)    HCT 38.5 (*)    All other components within normal limits  ETHANOL  SALICYLATE LEVEL  CK  RAPID URINE DRUG SCREEN, HOSP PERFORMED  URINALYSIS, ROUTINE W REFLEX MICROSCOPIC    EKG None  Radiology Dg Chest Portable 1 View  Result Date: 11/17/2017 CLINICAL DATA:  Initial evaluation for acute weakness. EXAM: PORTABLE CHEST 1 VIEW COMPARISON:  Prior radiograph from 02/27/2011. FINDINGS: Patient rotated to the right. Allowing for rotation, cardiac and mediastinal silhouettes are stable in size and contour, and remain within normal limits. Lungs hypoinflated. Secondary mild diffuse bronchovascular crowding. No focal infiltrates. No pulmonary edema or pleural effusion. No pneumothorax. No acute osseus abnormality. Remotely healed right clavicular fracture noted. IMPRESSION: Shallow lung inflation with no other active cardiopulmonary disease identified. Electronically Signed   By: Rise Mu M.D.   On: 11/17/2017 01:28    Procedures Procedures (including critical care time)  Medications Ordered in  ED Medications - No data to display   Initial Impression / Assessment and Plan / ED Course  I have reviewed the triage vital signs and the nursing notes.  Pertinent labs & imaging results that were available during my care of the patient were reviewed by me and considered in my medical decision making (see chart for details).    Patient with history of multiple sclerosis, discharged 2 days ago returning with feeling he is getting worse with diffuse body pain and right-sided weakness.  Did make some comment to EMS about wanting to hurt other people. the patient is apologetic about this and states he was just trying to express his degree of frustration with his pain level. MRI from July 15 of brain and C-spine showed severe MS but no active demyelinating lesions.  Patient does have right-sided weakness on exam which she states is worse than his recent admission.  He complains of pain and spasm to his right side.  Discussed with Dr. Otelia Limes of neurology on arrival. Dr. Otelia Limes reviewed recent admission and consult note as well as MRIs of brain and C-spine.  He states he would not offer patient steroids again.  Patient may benefit from muscle relaxer for his spasms.  Patient adamantly denies any suicidal or homicidal thoughts.  States he was just frustrated with his amount of pain.  Patient has been seen by neurology Dr. Otelia Limes.  Remains unable to walk after receiving muscle relaxers.  Dr. Otelia Limes does not feel he would benefit from any more steroids. As he is unable to walk, will discuss with internal medicine resident clinic who will evaluate him.  Final Clinical Impressions(s) / ED Diagnoses   Final diagnoses:  Muscle spasm  Multiple sclerosis [facility])    ED Discharge Orders    None       Glynn Octave, MD 11/17/17 [phone]

## 2017-11-17 NOTE — ED Notes (Signed)
Pt verbalizes understanding of d/c instructions.    [name] assisted pt out via wheelchair.

## 2017-11-17 NOTE — Consult Note (Signed)
NEURO HOSPITALIST CONSULT NOTE   Requestig physician: Dr. Manus Gunning  Reason for Consult: Diffuse pain in the setting of MS  History obtained from:   Patient and Chart     HPI:                                                                                                                                          [name] is a 37 y.o. male with MS recently treated with pulsed dose steroids for a diagnosed MS exacerbation, who returns with c/c of diffuse pain.   Per ED Triage nurse note: "Patient BIB [facility] EMS called out for generalized body pain due to MS.  While in transport patient started getting upset and saying he wanted to kill some of his family.  Patient states that he will "cut off someones head if they try to hurt him or his kids" including hospital staff.  Patient states he has pocket knife in belongings and those have been removed from the room.  Patient is rambling about family issues and hard to settle down."  The patient is poorly cooperative with providing history when Neurologist attempts to interview. He states that the Ativan injection given earlier has helped his pain significantly, but that pain is still present.   Past Medical History:  Diagnosis Date  . Depression   . Diabetes mellitus without complication (HCC)   . Eye abnormalities    unspecified by pt  . Hypertension   . Multiple sclerosis (HCC)   . Weakness    generalized    Past Surgical History:  Procedure Laterality Date  . WISDOM TOOTH EXTRACTION      Family History  Adopted: Yes  Problem Relation Age of Onset  . Multiple sclerosis Cousin   . Heart disease Mother   . Other Father        Does not know his father.              Social History:  reports that he has been smoking cigarettes.  He has been smoking about 1.00 pack per day. He has never used smokeless tobacco. He reports that he drinks about 7.2 oz of alcohol per week. He reports that he has current or past drug  history. Drug: Marijuana. Frequency: 30.00 times per week.  No Known Allergies  HOME MEDICATIONS:  ROS:                                                                                                                                       The patient is non-cooperative and agitated, limiting ability to obtain a reliable ROS.    Blood pressure 121/84, pulse (!) 53, temperature 98 F (36.7 C), temperature source Oral, resp. rate 16, SpO2 100 %.   General Examination:                                                                                                       Physical Exam  HEENT-  [place]/AT    Lungs- Respirations unlabored Extremities- No edema  Neurological Examination Mental Status: Awake and alert. Agitated. Able to follow all commands. No evidence for expressive or receptive aphasia. Answers some questions about his history of symptoms. Poorly cooperative. Cranial Nerves: II: Tracks examiner and fixates on visual stimuli. PERRL.  III,IV, VI: No ptosis. EOMI.  VII: No facial droop.  VIII: Hearing intact to voice IX,X: No hypophonia XI: Symmetric XII: Noncooperative Motor: Right : Upper extremity   4/5    Left:     Upper extremity   4/5  Lower extremity   4/5     Lower extremity   4/5 Spasticity noted x 4, worse in lower extremities than upper extremities.  Sensory: FT intact x 4.  Deep Tendon Reflexes: 3+ in upper and lower extremities.  Negative Hoffman sign bilaterally.  Toes equivocal bilaterally  Cerebellar: Ataxic FNF bilaterally.  Gait: Deferred due to agitation.    Lab Results: Basic Metabolic Panel: Recent Labs  Lab 11/11/17 1529 11/17/17 0040  NA 140 142  K 4.3 3.7  CL 108 108  CO2 27 26  GLUCOSE 101* 88  BUN 10 19  CREATININE 0.91 0.86  CALCIUM 8.9 8.6*    CBC: Recent Labs  Lab 11/11/17 1529 11/17/17 0040  WBC 4.1 4.0    HGB 13.1 12.6*  HCT 41.0 38.5*  MCV 88.6 87.1  PLT 215 187    Cardiac Enzymes: Recent Labs  Lab 11/17/17 0040  CKTOTAL 50    Lipid Panel: No results for input(s): CHOL, TRIG, HDL, CHOLHDL, VLDL, LDLCALC in the last 168 hours.  Imaging: Dg Chest Portable 1 View  Result Date: 11/17/2017 CLINICAL DATA:  Initial evaluation for acute weakness. EXAM: PORTABLE CHEST 1 VIEW COMPARISON:  Prior radiograph from 02/27/2011. FINDINGS: Patient rotated to the right. Allowing for rotation, cardiac and mediastinal silhouettes  are stable in size and contour, and remain within normal limits. Lungs hypoinflated. Secondary mild diffuse bronchovascular crowding. No focal infiltrates. No pulmonary edema or pleural effusion. No pneumothorax. No acute osseus abnormality. Remotely healed right clavicular fracture noted. IMPRESSION: Shallow lung inflation with no other active cardiopulmonary disease identified. Electronically Signed   By: Rise Mu M.D.   On: 11/17/2017 01:28    Assessment: 37 year old male with severe diffuse muscle pain in the setting of MS with spasticity 1. Ativan IV has helped his pain significantly, confirming muscle spasm as the etiology.  2. Recently treated with pulsed dose steroids for MS exacerbation.  3. Overall impression is that current presentation is due to muscle spasm with pain, and is not consistent with an MS exacerbation.   Recommendations: 1. Manage pain with muscle relaxants.  2. Consider PT and massage at home for long term improvement/amelioration of muscle spasm 3. Continue Robaxin at home 4. Consider addition of Flexeril   Electronically signed: Dr. Caryl Pina 11/17/2017, 6:11 AM

## 2017-11-17 NOTE — Consult Note (Signed)
Date: 11/17/2017               Patient Name:  [name] MRN: [id number]  DOB: Mar 31, 1981 Age / Sex: 37 y.o., male   PCP: Lonie Peak, PA-C         Requesting Physician: Dr. Glynn Octave, MD    Consulting Reason:  Inability to ambulate     Chief Complaint: pain, HI  History of Present Illness:  [name] is a 37 yo male with PMH of MS with recent flare (w/o new MRI changes) discharged 7/18 after 5 days of high dose steroids presenting to the ED via EMS for generalized body pain as well as homicidal ideation towards family and hospital staff.   Patient endorses endorses generalized pain but worse on the right side, and worse with walking and taking the stairs. He cannot state whether the pain is sharp, dull, radiating; it seems that the pain is intermittent. He continues to endorse right sided weakness which he states has not improved since December 2018. He states that he felt different and sick since he came out of the MRI machine a few days ago but is unable to specify how exactly. He endorses feeling that no one has done anything for him for 9 years since his MS onset and wonders why neurologists don't help prevent his particular triggers which are heat, weather changes, direct sunlight, and stress.  When he fell asleep after ativan that helped the pain; he states that when he wakes up he knows his pain will return but did not have further pain during our evaluation.  Patient denies chest pain, shortness of breath, abd pain, nausea, vomiting, decreased sensation, falls, rash.   When asked about the homicidal statements he made to EMS, he states that if someone were to threaten his kids he wouldn't think twice about killing that person, however he states no-one has made this threat and he has no plans to hurt anybody.   In the ED he has stable VS, unremarkable CBC and Cmet. CK was wnl. He was given IV ativan which helped his pain. Neurology was consulted and concluded that he was  not having another MS flare, rather a muscle spasm.   Meds: No current facility-administered medications for this encounter.    Current Outpatient Medications  Medication Sig Dispense Refill  . amantadine (SYMMETREL) 100 MG capsule Take 1 capsule (100 mg total) by mouth 2 (two) times daily. (Patient not taking: Reported on 11/11/2017) 60 capsule 0  . cholecalciferol 2000 units TABS Take 1 tablet (2,000 Units total) by mouth daily. (Patient not taking: Reported on 11/11/2017)    . gabapentin (NEURONTIN) 600 MG tablet Take 0.5 tablets (300 mg total) by mouth 3 (three) times daily as needed (neuropathic pain). (Patient not taking: Reported on 11/11/2017) 30 tablet 0  . methocarbamol (ROBAXIN) 500 MG tablet Take 1 tablet (500 mg total) by mouth every 8 (eight) hours as needed for muscle spasms. (Patient not taking: Reported on 11/11/2017) 30 tablet 0  . nicotine (NICODERM CQ - DOSED IN MG/24 HOURS) 21 mg/24hr patch 21 mg daily 1 week and 14 mg patch daily 3 weeks then 7 mg patch daily 3 weeks and stop (Patient not taking: Reported on 11/11/2017) 28 patch 0  . vitamin B-12 500 MCG tablet Take 1 tablet (500 mcg total) by mouth daily. (Patient not taking: Reported on 11/11/2017) 30 tablet 0    Allergies: Allergies as of 11/17/2017  . (No Known Allergies)   Past  Medical History:  Diagnosis Date  . Depression   . Diabetes mellitus without complication (HCC)   . Eye abnormalities    unspecified by pt  . Hypertension   . Multiple sclerosis (HCC)   . Weakness    generalized   Past Surgical History:  Procedure Laterality Date  . WISDOM TOOTH EXTRACTION     Family History  Adopted: Yes  Problem Relation Age of Onset  . Multiple sclerosis Cousin   . Heart disease Mother   . Other Father        Does not know his father.   Social History   Socioeconomic History  . Marital status: Married    Spouse name: Not on file  . Number of children: 2  . Years of education: 51  . Highest education  level: Not on file  Occupational History  . Not on file  Social Needs  . Financial resource strain: Not on file  . Food insecurity:    Worry: Not on file    Inability: Not on file  . Transportation needs:    Medical: Not on file    Non-medical: Not on file  Tobacco Use  . Smoking status: Current Every Day Smoker    Packs/day: 1.00    Types: Cigarettes  . Smokeless tobacco: Never Used  Substance and Sexual Activity  . Alcohol use: Yes    Alcohol/week: 7.2 oz    Types: 12 Cans of beer per week  . Drug use: Yes    Frequency: 30.0 times per week    Types: Marijuana    Comment: Daily  . Sexual activity: Yes  Lifestyle  . Physical activity:    Days per week: Not on file    Minutes per session: Not on file  . Stress: Not on file  Relationships  . Social connections:    Talks on phone: Not on file    Gets together: Not on file    Attends religious service: Not on file    Active member of club or organization: Not on file    Attends meetings of clubs or organizations: Not on file    Relationship status: Not on file  . Intimate partner violence:    Fear of current or ex partner: Not on file    Emotionally abused: Not on file    Physically abused: Not on file    Forced sexual activity: Not on file  Other Topics Concern  . Not on file  Social History Narrative   Patient lives at home with his wife and children.    Patient is presently not working. Patient has a high school education.   Adopted.   Right-handed.   At least 8 cups caffeine daily.    Review of Systems: Negative other than noted in HPI.  Physical Exam: Blood pressure 122/79, pulse (!) 52, temperature 98 F (36.7 C), temperature source Oral, resp. rate 16, SpO2 99 %. GENERAL- alert, co-operative, appears as stated age, not in any distress. HEENT- Atraumatic, normocephalic, PERRL, EOMI, oral mucosa appears moist CARDIAC- RRR, no murmurs, rubs or gallops. RESP- Moving equal volumes of air, and clear to  auscultation bilaterally, no wheezes or crackles. ABDOMEN- Soft, nontender, bowel sounds present. NEURO- Mildly asymmetric eye brow raise otherwise CN 2-12 intact; RU/LE 4/5; subjective mild decreased sensation in RUE otherwise intact.  EXTREMITIES- pulse 2+, symmetric, no pedal edema. SKIN- Warm, dry, no rash or lesion. PSYCH- tangential speech  Imaging results:  CXR personally reviewed - shallow lung inflation;  no effusion, infiltrate, pneumothorax or consolidation  Assessment, Plan, & Recommendations by Problem: Principal Problem:   Muscle spasm Active Problems:   Multiple sclerosis (HCC)   Homicidal ideation  Multiple sclerosis Muscle spasm: Patient presents with mainly right sided pain in setting of recent MS flare which involved his right extremities. Strength is the same as on discharge a couple of days ago. Neurology evaluated patient and agree pain is likely from muscle spasm. Patient states he does not take any medicines and will not take any medicines other than smoke marijuana. He already has outpatient PT referral from recent admission as he declined home health and SNF. We will still prescribe flexeril at discharge from ED. Urged him to follow up with [facility] neurology; he does not want to follow up in [place] and was dismissed from [facility] due to no-shows. His last treatment regimen was [place] which he received one year ago.  --d/c from ED, Dr. Criselda Peaches to evaluate patient prior to discharge; will coordinate with ED doc --flexeril 5mg  TID PRN --f/u with [facility] neuro --continue referral for outpt PT; patient stated last admission he had all medical equipment he needed.  Report of homicidal ideation: Unable to elucidate what brought on his comments, but he denies active HI or plan. No indication for psych on these grounds currently.  Signed: Nyra Market, MD 11/17/2017, 7:42 AM

## 2017-11-17 NOTE — ED Notes (Signed)
Safety sitter at the bedside. °

## 2017-11-20 DIAGNOSIS — R69 Illness, unspecified: Secondary | ICD-10-CM | POA: Diagnosis not present

## 2017-11-20 DIAGNOSIS — R739 Hyperglycemia, unspecified: Secondary | ICD-10-CM | POA: Diagnosis not present

## 2017-11-20 DIAGNOSIS — M25561 Pain in right knee: Secondary | ICD-10-CM | POA: Diagnosis not present

## 2017-11-20 DIAGNOSIS — T380X5D Adverse effect of glucocorticoids and synthetic analogues, subsequent encounter: Secondary | ICD-10-CM | POA: Diagnosis not present

## 2017-11-20 DIAGNOSIS — G35 Multiple sclerosis: Secondary | ICD-10-CM | POA: Diagnosis not present

## 2017-11-20 DIAGNOSIS — H538 Other visual disturbances: Secondary | ICD-10-CM | POA: Diagnosis not present

## 2017-11-20 DIAGNOSIS — M25562 Pain in left knee: Secondary | ICD-10-CM | POA: Diagnosis not present

## 2017-11-20 DIAGNOSIS — M25552 Pain in left hip: Secondary | ICD-10-CM | POA: Diagnosis not present

## 2017-11-20 DIAGNOSIS — I10 Essential (primary) hypertension: Secondary | ICD-10-CM | POA: Diagnosis not present

## 2017-11-20 DIAGNOSIS — M25551 Pain in right hip: Secondary | ICD-10-CM | POA: Diagnosis not present

## 2017-11-22 DIAGNOSIS — M25551 Pain in right hip: Secondary | ICD-10-CM | POA: Diagnosis not present

## 2017-11-22 DIAGNOSIS — R739 Hyperglycemia, unspecified: Secondary | ICD-10-CM | POA: Diagnosis not present

## 2017-11-22 DIAGNOSIS — M25552 Pain in left hip: Secondary | ICD-10-CM | POA: Diagnosis not present

## 2017-11-22 DIAGNOSIS — M25562 Pain in left knee: Secondary | ICD-10-CM | POA: Diagnosis not present

## 2017-11-22 DIAGNOSIS — I10 Essential (primary) hypertension: Secondary | ICD-10-CM | POA: Diagnosis not present

## 2017-11-22 DIAGNOSIS — G35 Multiple sclerosis: Secondary | ICD-10-CM | POA: Diagnosis not present

## 2017-11-22 DIAGNOSIS — R69 Illness, unspecified: Secondary | ICD-10-CM | POA: Diagnosis not present

## 2017-11-22 DIAGNOSIS — H538 Other visual disturbances: Secondary | ICD-10-CM | POA: Diagnosis not present

## 2017-11-22 DIAGNOSIS — M25561 Pain in right knee: Secondary | ICD-10-CM | POA: Diagnosis not present

## 2017-11-22 DIAGNOSIS — T380X5D Adverse effect of glucocorticoids and synthetic analogues, subsequent encounter: Secondary | ICD-10-CM | POA: Diagnosis not present

## 2017-11-27 DIAGNOSIS — R739 Hyperglycemia, unspecified: Secondary | ICD-10-CM | POA: Diagnosis not present

## 2017-11-27 DIAGNOSIS — M25552 Pain in left hip: Secondary | ICD-10-CM | POA: Diagnosis not present

## 2017-11-27 DIAGNOSIS — I10 Essential (primary) hypertension: Secondary | ICD-10-CM | POA: Diagnosis not present

## 2017-11-27 DIAGNOSIS — R69 Illness, unspecified: Secondary | ICD-10-CM | POA: Diagnosis not present

## 2017-11-27 DIAGNOSIS — M25562 Pain in left knee: Secondary | ICD-10-CM | POA: Diagnosis not present

## 2017-11-27 DIAGNOSIS — H538 Other visual disturbances: Secondary | ICD-10-CM | POA: Diagnosis not present

## 2017-11-27 DIAGNOSIS — M25551 Pain in right hip: Secondary | ICD-10-CM | POA: Diagnosis not present

## 2017-11-27 DIAGNOSIS — T380X5D Adverse effect of glucocorticoids and synthetic analogues, subsequent encounter: Secondary | ICD-10-CM | POA: Diagnosis not present

## 2017-11-27 DIAGNOSIS — G35 Multiple sclerosis: Secondary | ICD-10-CM | POA: Diagnosis not present

## 2017-11-27 DIAGNOSIS — M25561 Pain in right knee: Secondary | ICD-10-CM | POA: Diagnosis not present

## 2017-11-29 DIAGNOSIS — H538 Other visual disturbances: Secondary | ICD-10-CM | POA: Diagnosis not present

## 2017-11-29 DIAGNOSIS — G35 Multiple sclerosis: Secondary | ICD-10-CM | POA: Diagnosis not present

## 2017-11-29 DIAGNOSIS — T380X5D Adverse effect of glucocorticoids and synthetic analogues, subsequent encounter: Secondary | ICD-10-CM | POA: Diagnosis not present

## 2017-11-29 DIAGNOSIS — M25561 Pain in right knee: Secondary | ICD-10-CM | POA: Diagnosis not present

## 2017-11-29 DIAGNOSIS — R69 Illness, unspecified: Secondary | ICD-10-CM | POA: Diagnosis not present

## 2017-11-29 DIAGNOSIS — R739 Hyperglycemia, unspecified: Secondary | ICD-10-CM | POA: Diagnosis not present

## 2017-11-29 DIAGNOSIS — M25552 Pain in left hip: Secondary | ICD-10-CM | POA: Diagnosis not present

## 2017-11-29 DIAGNOSIS — I10 Essential (primary) hypertension: Secondary | ICD-10-CM | POA: Diagnosis not present

## 2017-11-29 DIAGNOSIS — M25551 Pain in right hip: Secondary | ICD-10-CM | POA: Diagnosis not present

## 2017-11-29 DIAGNOSIS — M25562 Pain in left knee: Secondary | ICD-10-CM | POA: Diagnosis not present

## 2017-12-03 DIAGNOSIS — T380X5D Adverse effect of glucocorticoids and synthetic analogues, subsequent encounter: Secondary | ICD-10-CM | POA: Diagnosis not present

## 2017-12-03 DIAGNOSIS — R69 Illness, unspecified: Secondary | ICD-10-CM | POA: Diagnosis not present

## 2017-12-03 DIAGNOSIS — I10 Essential (primary) hypertension: Secondary | ICD-10-CM | POA: Diagnosis not present

## 2017-12-03 DIAGNOSIS — M25551 Pain in right hip: Secondary | ICD-10-CM | POA: Diagnosis not present

## 2017-12-03 DIAGNOSIS — H538 Other visual disturbances: Secondary | ICD-10-CM | POA: Diagnosis not present

## 2017-12-03 DIAGNOSIS — G35 Multiple sclerosis: Secondary | ICD-10-CM | POA: Diagnosis not present

## 2017-12-03 DIAGNOSIS — M25552 Pain in left hip: Secondary | ICD-10-CM | POA: Diagnosis not present

## 2017-12-03 DIAGNOSIS — M25561 Pain in right knee: Secondary | ICD-10-CM | POA: Diagnosis not present

## 2017-12-03 DIAGNOSIS — M25562 Pain in left knee: Secondary | ICD-10-CM | POA: Diagnosis not present

## 2017-12-03 DIAGNOSIS — R739 Hyperglycemia, unspecified: Secondary | ICD-10-CM | POA: Diagnosis not present

## 2017-12-04 DIAGNOSIS — R739 Hyperglycemia, unspecified: Secondary | ICD-10-CM | POA: Diagnosis not present

## 2017-12-04 DIAGNOSIS — H538 Other visual disturbances: Secondary | ICD-10-CM | POA: Diagnosis not present

## 2017-12-04 DIAGNOSIS — M25562 Pain in left knee: Secondary | ICD-10-CM | POA: Diagnosis not present

## 2017-12-04 DIAGNOSIS — M25552 Pain in left hip: Secondary | ICD-10-CM | POA: Diagnosis not present

## 2017-12-04 DIAGNOSIS — I10 Essential (primary) hypertension: Secondary | ICD-10-CM | POA: Diagnosis not present

## 2017-12-04 DIAGNOSIS — M25551 Pain in right hip: Secondary | ICD-10-CM | POA: Diagnosis not present

## 2017-12-04 DIAGNOSIS — G35 Multiple sclerosis: Secondary | ICD-10-CM | POA: Diagnosis not present

## 2017-12-04 DIAGNOSIS — T380X5D Adverse effect of glucocorticoids and synthetic analogues, subsequent encounter: Secondary | ICD-10-CM | POA: Diagnosis not present

## 2017-12-04 DIAGNOSIS — R69 Illness, unspecified: Secondary | ICD-10-CM | POA: Diagnosis not present

## 2017-12-04 DIAGNOSIS — M25561 Pain in right knee: Secondary | ICD-10-CM | POA: Diagnosis not present

## 2017-12-06 DIAGNOSIS — M25562 Pain in left knee: Secondary | ICD-10-CM | POA: Diagnosis not present

## 2017-12-06 DIAGNOSIS — T380X5D Adverse effect of glucocorticoids and synthetic analogues, subsequent encounter: Secondary | ICD-10-CM | POA: Diagnosis not present

## 2017-12-06 DIAGNOSIS — M25561 Pain in right knee: Secondary | ICD-10-CM | POA: Diagnosis not present

## 2017-12-06 DIAGNOSIS — R739 Hyperglycemia, unspecified: Secondary | ICD-10-CM | POA: Diagnosis not present

## 2017-12-06 DIAGNOSIS — R69 Illness, unspecified: Secondary | ICD-10-CM | POA: Diagnosis not present

## 2017-12-06 DIAGNOSIS — M25551 Pain in right hip: Secondary | ICD-10-CM | POA: Diagnosis not present

## 2017-12-06 DIAGNOSIS — M25552 Pain in left hip: Secondary | ICD-10-CM | POA: Diagnosis not present

## 2017-12-06 DIAGNOSIS — I10 Essential (primary) hypertension: Secondary | ICD-10-CM | POA: Diagnosis not present

## 2017-12-06 DIAGNOSIS — G35 Multiple sclerosis: Secondary | ICD-10-CM | POA: Diagnosis not present

## 2017-12-06 DIAGNOSIS — H538 Other visual disturbances: Secondary | ICD-10-CM | POA: Diagnosis not present

## 2017-12-11 DIAGNOSIS — M25561 Pain in right knee: Secondary | ICD-10-CM | POA: Diagnosis not present

## 2017-12-11 DIAGNOSIS — M25562 Pain in left knee: Secondary | ICD-10-CM | POA: Diagnosis not present

## 2017-12-11 DIAGNOSIS — T380X5D Adverse effect of glucocorticoids and synthetic analogues, subsequent encounter: Secondary | ICD-10-CM | POA: Diagnosis not present

## 2017-12-11 DIAGNOSIS — I10 Essential (primary) hypertension: Secondary | ICD-10-CM | POA: Diagnosis not present

## 2017-12-11 DIAGNOSIS — R739 Hyperglycemia, unspecified: Secondary | ICD-10-CM | POA: Diagnosis not present

## 2017-12-11 DIAGNOSIS — M25551 Pain in right hip: Secondary | ICD-10-CM | POA: Diagnosis not present

## 2017-12-11 DIAGNOSIS — M25552 Pain in left hip: Secondary | ICD-10-CM | POA: Diagnosis not present

## 2017-12-11 DIAGNOSIS — G35 Multiple sclerosis: Secondary | ICD-10-CM | POA: Diagnosis not present

## 2017-12-11 DIAGNOSIS — R69 Illness, unspecified: Secondary | ICD-10-CM | POA: Diagnosis not present

## 2017-12-11 DIAGNOSIS — H538 Other visual disturbances: Secondary | ICD-10-CM | POA: Diagnosis not present

## 2017-12-14 DIAGNOSIS — R69 Illness, unspecified: Secondary | ICD-10-CM | POA: Diagnosis not present

## 2017-12-14 DIAGNOSIS — G35 Multiple sclerosis: Secondary | ICD-10-CM | POA: Diagnosis not present

## 2017-12-14 DIAGNOSIS — Z6826 Body mass index (BMI) 26.0-26.9, adult: Secondary | ICD-10-CM | POA: Diagnosis not present

## 2017-12-18 DIAGNOSIS — M25562 Pain in left knee: Secondary | ICD-10-CM | POA: Diagnosis not present

## 2017-12-18 DIAGNOSIS — R69 Illness, unspecified: Secondary | ICD-10-CM | POA: Diagnosis not present

## 2017-12-18 DIAGNOSIS — I10 Essential (primary) hypertension: Secondary | ICD-10-CM | POA: Diagnosis not present

## 2017-12-18 DIAGNOSIS — H538 Other visual disturbances: Secondary | ICD-10-CM | POA: Diagnosis not present

## 2017-12-18 DIAGNOSIS — M25552 Pain in left hip: Secondary | ICD-10-CM | POA: Diagnosis not present

## 2017-12-18 DIAGNOSIS — R739 Hyperglycemia, unspecified: Secondary | ICD-10-CM | POA: Diagnosis not present

## 2017-12-18 DIAGNOSIS — M25561 Pain in right knee: Secondary | ICD-10-CM | POA: Diagnosis not present

## 2017-12-18 DIAGNOSIS — T380X5D Adverse effect of glucocorticoids and synthetic analogues, subsequent encounter: Secondary | ICD-10-CM | POA: Diagnosis not present

## 2017-12-18 DIAGNOSIS — G35 Multiple sclerosis: Secondary | ICD-10-CM | POA: Diagnosis not present

## 2017-12-18 DIAGNOSIS — M25551 Pain in right hip: Secondary | ICD-10-CM | POA: Diagnosis not present

## 2017-12-21 DIAGNOSIS — H538 Other visual disturbances: Secondary | ICD-10-CM | POA: Diagnosis not present

## 2017-12-21 DIAGNOSIS — I10 Essential (primary) hypertension: Secondary | ICD-10-CM | POA: Diagnosis not present

## 2017-12-21 DIAGNOSIS — R739 Hyperglycemia, unspecified: Secondary | ICD-10-CM | POA: Diagnosis not present

## 2017-12-21 DIAGNOSIS — M25561 Pain in right knee: Secondary | ICD-10-CM | POA: Diagnosis not present

## 2017-12-21 DIAGNOSIS — R69 Illness, unspecified: Secondary | ICD-10-CM | POA: Diagnosis not present

## 2017-12-21 DIAGNOSIS — G35 Multiple sclerosis: Secondary | ICD-10-CM | POA: Diagnosis not present

## 2017-12-21 DIAGNOSIS — T380X5D Adverse effect of glucocorticoids and synthetic analogues, subsequent encounter: Secondary | ICD-10-CM | POA: Diagnosis not present

## 2017-12-21 DIAGNOSIS — M25551 Pain in right hip: Secondary | ICD-10-CM | POA: Diagnosis not present

## 2017-12-21 DIAGNOSIS — M25562 Pain in left knee: Secondary | ICD-10-CM | POA: Diagnosis not present

## 2017-12-21 DIAGNOSIS — M25552 Pain in left hip: Secondary | ICD-10-CM | POA: Diagnosis not present

## 2017-12-25 DIAGNOSIS — M25552 Pain in left hip: Secondary | ICD-10-CM | POA: Diagnosis not present

## 2017-12-25 DIAGNOSIS — R739 Hyperglycemia, unspecified: Secondary | ICD-10-CM | POA: Diagnosis not present

## 2017-12-25 DIAGNOSIS — M25551 Pain in right hip: Secondary | ICD-10-CM | POA: Diagnosis not present

## 2017-12-25 DIAGNOSIS — T380X5D Adverse effect of glucocorticoids and synthetic analogues, subsequent encounter: Secondary | ICD-10-CM | POA: Diagnosis not present

## 2017-12-25 DIAGNOSIS — H538 Other visual disturbances: Secondary | ICD-10-CM | POA: Diagnosis not present

## 2017-12-25 DIAGNOSIS — M25562 Pain in left knee: Secondary | ICD-10-CM | POA: Diagnosis not present

## 2017-12-25 DIAGNOSIS — I10 Essential (primary) hypertension: Secondary | ICD-10-CM | POA: Diagnosis not present

## 2017-12-25 DIAGNOSIS — G35 Multiple sclerosis: Secondary | ICD-10-CM | POA: Diagnosis not present

## 2017-12-25 DIAGNOSIS — M25561 Pain in right knee: Secondary | ICD-10-CM | POA: Diagnosis not present

## 2017-12-25 DIAGNOSIS — R69 Illness, unspecified: Secondary | ICD-10-CM | POA: Diagnosis not present

## 2017-12-27 DIAGNOSIS — R69 Illness, unspecified: Secondary | ICD-10-CM | POA: Diagnosis not present

## 2017-12-27 DIAGNOSIS — M25551 Pain in right hip: Secondary | ICD-10-CM | POA: Diagnosis not present

## 2017-12-27 DIAGNOSIS — M25552 Pain in left hip: Secondary | ICD-10-CM | POA: Diagnosis not present

## 2017-12-27 DIAGNOSIS — M25562 Pain in left knee: Secondary | ICD-10-CM | POA: Diagnosis not present

## 2017-12-27 DIAGNOSIS — H538 Other visual disturbances: Secondary | ICD-10-CM | POA: Diagnosis not present

## 2017-12-27 DIAGNOSIS — I10 Essential (primary) hypertension: Secondary | ICD-10-CM | POA: Diagnosis not present

## 2017-12-27 DIAGNOSIS — G35 Multiple sclerosis: Secondary | ICD-10-CM | POA: Diagnosis not present

## 2017-12-27 DIAGNOSIS — M25561 Pain in right knee: Secondary | ICD-10-CM | POA: Diagnosis not present

## 2017-12-27 DIAGNOSIS — T380X5D Adverse effect of glucocorticoids and synthetic analogues, subsequent encounter: Secondary | ICD-10-CM | POA: Diagnosis not present

## 2017-12-27 DIAGNOSIS — R739 Hyperglycemia, unspecified: Secondary | ICD-10-CM | POA: Diagnosis not present

## 2018-01-01 DIAGNOSIS — G35 Multiple sclerosis: Secondary | ICD-10-CM | POA: Diagnosis not present

## 2018-01-01 DIAGNOSIS — M25551 Pain in right hip: Secondary | ICD-10-CM | POA: Diagnosis not present

## 2018-01-01 DIAGNOSIS — M25552 Pain in left hip: Secondary | ICD-10-CM | POA: Diagnosis not present

## 2018-01-01 DIAGNOSIS — R739 Hyperglycemia, unspecified: Secondary | ICD-10-CM | POA: Diagnosis not present

## 2018-01-01 DIAGNOSIS — T380X5D Adverse effect of glucocorticoids and synthetic analogues, subsequent encounter: Secondary | ICD-10-CM | POA: Diagnosis not present

## 2018-01-01 DIAGNOSIS — I10 Essential (primary) hypertension: Secondary | ICD-10-CM | POA: Diagnosis not present

## 2018-01-01 DIAGNOSIS — H538 Other visual disturbances: Secondary | ICD-10-CM | POA: Diagnosis not present

## 2018-01-01 DIAGNOSIS — M25562 Pain in left knee: Secondary | ICD-10-CM | POA: Diagnosis not present

## 2018-01-01 DIAGNOSIS — M25561 Pain in right knee: Secondary | ICD-10-CM | POA: Diagnosis not present

## 2018-01-01 DIAGNOSIS — R69 Illness, unspecified: Secondary | ICD-10-CM | POA: Diagnosis not present

## 2018-01-03 DIAGNOSIS — I10 Essential (primary) hypertension: Secondary | ICD-10-CM | POA: Diagnosis not present

## 2018-01-03 DIAGNOSIS — R69 Illness, unspecified: Secondary | ICD-10-CM | POA: Diagnosis not present

## 2018-01-03 DIAGNOSIS — M25561 Pain in right knee: Secondary | ICD-10-CM | POA: Diagnosis not present

## 2018-01-03 DIAGNOSIS — M25551 Pain in right hip: Secondary | ICD-10-CM | POA: Diagnosis not present

## 2018-01-03 DIAGNOSIS — R739 Hyperglycemia, unspecified: Secondary | ICD-10-CM | POA: Diagnosis not present

## 2018-01-03 DIAGNOSIS — M25552 Pain in left hip: Secondary | ICD-10-CM | POA: Diagnosis not present

## 2018-01-03 DIAGNOSIS — M25562 Pain in left knee: Secondary | ICD-10-CM | POA: Diagnosis not present

## 2018-01-03 DIAGNOSIS — T380X5D Adverse effect of glucocorticoids and synthetic analogues, subsequent encounter: Secondary | ICD-10-CM | POA: Diagnosis not present

## 2018-01-03 DIAGNOSIS — H538 Other visual disturbances: Secondary | ICD-10-CM | POA: Diagnosis not present

## 2018-01-03 DIAGNOSIS — G35 Multiple sclerosis: Secondary | ICD-10-CM | POA: Diagnosis not present

## 2018-01-08 DIAGNOSIS — G35 Multiple sclerosis: Secondary | ICD-10-CM | POA: Diagnosis not present

## 2018-01-08 DIAGNOSIS — R739 Hyperglycemia, unspecified: Secondary | ICD-10-CM | POA: Diagnosis not present

## 2018-01-08 DIAGNOSIS — M25561 Pain in right knee: Secondary | ICD-10-CM | POA: Diagnosis not present

## 2018-01-08 DIAGNOSIS — I10 Essential (primary) hypertension: Secondary | ICD-10-CM | POA: Diagnosis not present

## 2018-01-08 DIAGNOSIS — T380X5D Adverse effect of glucocorticoids and synthetic analogues, subsequent encounter: Secondary | ICD-10-CM | POA: Diagnosis not present

## 2018-01-08 DIAGNOSIS — M25552 Pain in left hip: Secondary | ICD-10-CM | POA: Diagnosis not present

## 2018-01-08 DIAGNOSIS — M25562 Pain in left knee: Secondary | ICD-10-CM | POA: Diagnosis not present

## 2018-01-08 DIAGNOSIS — H538 Other visual disturbances: Secondary | ICD-10-CM | POA: Diagnosis not present

## 2018-01-08 DIAGNOSIS — R69 Illness, unspecified: Secondary | ICD-10-CM | POA: Diagnosis not present

## 2018-01-08 DIAGNOSIS — M25551 Pain in right hip: Secondary | ICD-10-CM | POA: Diagnosis not present

## 2018-01-10 DIAGNOSIS — I10 Essential (primary) hypertension: Secondary | ICD-10-CM | POA: Diagnosis not present

## 2018-01-10 DIAGNOSIS — M25561 Pain in right knee: Secondary | ICD-10-CM | POA: Diagnosis not present

## 2018-01-10 DIAGNOSIS — R739 Hyperglycemia, unspecified: Secondary | ICD-10-CM | POA: Diagnosis not present

## 2018-01-10 DIAGNOSIS — G35 Multiple sclerosis: Secondary | ICD-10-CM | POA: Diagnosis not present

## 2018-01-10 DIAGNOSIS — H538 Other visual disturbances: Secondary | ICD-10-CM | POA: Diagnosis not present

## 2018-01-10 DIAGNOSIS — M25562 Pain in left knee: Secondary | ICD-10-CM | POA: Diagnosis not present

## 2018-01-10 DIAGNOSIS — M25552 Pain in left hip: Secondary | ICD-10-CM | POA: Diagnosis not present

## 2018-01-10 DIAGNOSIS — R69 Illness, unspecified: Secondary | ICD-10-CM | POA: Diagnosis not present

## 2018-01-10 DIAGNOSIS — M25551 Pain in right hip: Secondary | ICD-10-CM | POA: Diagnosis not present

## 2018-01-10 DIAGNOSIS — T380X5D Adverse effect of glucocorticoids and synthetic analogues, subsequent encounter: Secondary | ICD-10-CM | POA: Diagnosis not present

## 2018-02-04 DIAGNOSIS — Z136 Encounter for screening for cardiovascular disorders: Secondary | ICD-10-CM | POA: Diagnosis not present

## 2018-02-04 DIAGNOSIS — E785 Hyperlipidemia, unspecified: Secondary | ICD-10-CM | POA: Diagnosis not present

## 2018-02-04 DIAGNOSIS — Z1339 Encounter for screening examination for other mental health and behavioral disorders: Secondary | ICD-10-CM | POA: Diagnosis not present

## 2018-02-04 DIAGNOSIS — Z0000 Encounter for general adult medical examination without abnormal findings: Secondary | ICD-10-CM | POA: Diagnosis not present

## 2018-02-04 DIAGNOSIS — Z125 Encounter for screening for malignant neoplasm of prostate: Secondary | ICD-10-CM | POA: Diagnosis not present

## 2018-02-21 ENCOUNTER — Telehealth: Payer: Self-pay | Admitting: *Deleted

## 2018-02-21 DIAGNOSIS — G35 Multiple sclerosis: Secondary | ICD-10-CM | POA: Diagnosis not present

## 2018-02-21 DIAGNOSIS — R0781 Pleurodynia: Secondary | ICD-10-CM | POA: Diagnosis not present

## 2018-02-21 NOTE — Telephone Encounter (Signed)
Received a call from PCP office Lonie Peak, [place]) inquiring to see if Dr. Terrace Arabia would be willing to just prescribe this patient's second year [place].  He was dismissed, in February 2019, for non-compliance.  He has failed to get his monthly labs after his first year Lemtrada, no-showed multiple office visits and verbally expressed his wish to seek care elsewhere.  He has been offered referrals to other neurologists.  Dr. Terrace Arabia is unwilling to resume his care or accept the risk that comes with prescribing Lemtrada to a non-compliant patient.

## 2018-04-15 DIAGNOSIS — Z0289 Encounter for other administrative examinations: Secondary | ICD-10-CM

## 2018-05-28 ENCOUNTER — Other Ambulatory Visit: Payer: Self-pay

## 2018-05-28 ENCOUNTER — Emergency Department (HOSPITAL_COMMUNITY): Payer: Medicare HMO

## 2018-05-28 ENCOUNTER — Encounter (HOSPITAL_COMMUNITY): Payer: Self-pay | Admitting: Emergency Medicine

## 2018-05-28 ENCOUNTER — Inpatient Hospital Stay (HOSPITAL_COMMUNITY)
Admission: EM | Admit: 2018-05-28 | Discharge: 2018-05-31 | DRG: 060 | Disposition: A | Discharge status: Skilled Nursing Facility | Payer: Medicare HMO | Attending: Family Medicine | Admitting: Family Medicine

## 2018-05-28 DIAGNOSIS — R52 Pain, unspecified: Secondary | ICD-10-CM | POA: Diagnosis not present

## 2018-05-28 DIAGNOSIS — R296 Repeated falls: Secondary | ICD-10-CM | POA: Diagnosis present

## 2018-05-28 DIAGNOSIS — W19XXXA Unspecified fall, initial encounter: Secondary | ICD-10-CM | POA: Diagnosis not present

## 2018-05-28 DIAGNOSIS — R402252 Coma scale, best verbal response, oriented, at arrival to emergency department: Secondary | ICD-10-CM | POA: Diagnosis present

## 2018-05-28 DIAGNOSIS — E538 Deficiency of other specified B group vitamins: Secondary | ICD-10-CM | POA: Diagnosis present

## 2018-05-28 DIAGNOSIS — R270 Ataxia, unspecified: Secondary | ICD-10-CM | POA: Diagnosis not present

## 2018-05-28 DIAGNOSIS — I10 Essential (primary) hypertension: Secondary | ICD-10-CM | POA: Diagnosis present

## 2018-05-28 DIAGNOSIS — Z820 Family history of epilepsy and other diseases of the nervous system: Secondary | ICD-10-CM | POA: Diagnosis not present

## 2018-05-28 DIAGNOSIS — F329 Major depressive disorder, single episode, unspecified: Secondary | ICD-10-CM | POA: Diagnosis present

## 2018-05-28 DIAGNOSIS — F17210 Nicotine dependence, cigarettes, uncomplicated: Secondary | ICD-10-CM | POA: Diagnosis present

## 2018-05-28 DIAGNOSIS — R402142 Coma scale, eyes open, spontaneous, at arrival to emergency department: Secondary | ICD-10-CM | POA: Diagnosis not present

## 2018-05-28 DIAGNOSIS — Z9119 Patient's noncompliance with other medical treatment and regimen: Secondary | ICD-10-CM | POA: Diagnosis not present

## 2018-05-28 DIAGNOSIS — Z8249 Family history of ischemic heart disease and other diseases of the circulatory system: Secondary | ICD-10-CM

## 2018-05-28 DIAGNOSIS — Z888 Allergy status to other drugs, medicaments and biological substances status: Secondary | ICD-10-CM | POA: Diagnosis not present

## 2018-05-28 DIAGNOSIS — R69 Illness, unspecified: Secondary | ICD-10-CM | POA: Diagnosis not present

## 2018-05-28 DIAGNOSIS — R402362 Coma scale, best motor response, obeys commands, at arrival to emergency department: Secondary | ICD-10-CM | POA: Diagnosis present

## 2018-05-28 DIAGNOSIS — G35 Multiple sclerosis: Secondary | ICD-10-CM | POA: Diagnosis not present

## 2018-05-28 DIAGNOSIS — E559 Vitamin D deficiency, unspecified: Secondary | ICD-10-CM | POA: Diagnosis not present

## 2018-05-28 DIAGNOSIS — R4189 Other symptoms and signs involving cognitive functions and awareness: Secondary | ICD-10-CM | POA: Diagnosis not present

## 2018-05-28 DIAGNOSIS — Z8709 Personal history of other diseases of the respiratory system: Secondary | ICD-10-CM | POA: Diagnosis not present

## 2018-05-28 DIAGNOSIS — E119 Type 2 diabetes mellitus without complications: Secondary | ICD-10-CM | POA: Diagnosis present

## 2018-05-28 DIAGNOSIS — R269 Unspecified abnormalities of gait and mobility: Secondary | ICD-10-CM | POA: Diagnosis not present

## 2018-05-28 DIAGNOSIS — J101 Influenza due to other identified influenza virus with other respiratory manifestations: Secondary | ICD-10-CM | POA: Diagnosis not present

## 2018-05-28 DIAGNOSIS — G8191 Hemiplegia, unspecified affecting right dominant side: Secondary | ICD-10-CM | POA: Diagnosis not present

## 2018-05-28 DIAGNOSIS — R4689 Other symptoms and signs involving appearance and behavior: Secondary | ICD-10-CM | POA: Diagnosis not present

## 2018-05-28 DIAGNOSIS — R531 Weakness: Secondary | ICD-10-CM | POA: Diagnosis not present

## 2018-05-28 DIAGNOSIS — R29810 Facial weakness: Secondary | ICD-10-CM | POA: Diagnosis present

## 2018-05-28 LAB — CBC WITH DIFFERENTIAL/PLATELET
Abs Immature Granulocytes: 0.03 10*3/uL (ref 0.00–0.07)
Basophils Absolute: 0 10*3/uL (ref 0.0–0.1)
Basophils Relative: 1 %
Eosinophils Absolute: 0 10*3/uL (ref 0.0–0.5)
Eosinophils Relative: 0 %
HCT: 38.9 % — ABNORMAL LOW (ref 39.0–52.0)
Hemoglobin: 12.3 g/dL — ABNORMAL LOW (ref 13.0–17.0)
Immature Granulocytes: 1 %
Lymphocytes Relative: 8 %
Lymphs Abs: 0.5 10*3/uL — ABNORMAL LOW (ref 0.7–4.0)
MCH: 27.6 pg (ref 26.0–34.0)
MCHC: 31.6 g/dL (ref 30.0–36.0)
MCV: 87.2 fL (ref 80.0–100.0)
Monocytes Absolute: 0.4 10*3/uL (ref 0.1–1.0)
Monocytes Relative: 7 %
Neutro Abs: 4.7 10*3/uL (ref 1.7–7.7)
Neutrophils Relative %: 83 %
Platelets: 237 10*3/uL (ref 150–400)
RBC: 4.46 MIL/uL (ref 4.22–5.81)
RDW: 13.5 % (ref 11.5–15.5)
WBC: 5.6 10*3/uL (ref 4.0–10.5)
nRBC: 0 % (ref 0.0–0.2)

## 2018-05-28 LAB — BASIC METABOLIC PANEL
Anion gap: 9 (ref 5–15)
BUN: 8 mg/dL (ref 6–20)
CO2: 24 mmol/L (ref 22–32)
Calcium: 9.2 mg/dL (ref 8.9–10.3)
Chloride: 107 mmol/L (ref 98–111)
Creatinine, Ser: 0.93 mg/dL (ref 0.61–1.24)
GFR calc Af Amer: >60 mL/min (ref >60)
GFR calc non Af Amer: >60 mL/min (ref >60)
Glucose, Bld: 87 mg/dL (ref 70–99)
Potassium: 4 mmol/L (ref 3.5–5.1)
Sodium: 140 mmol/L (ref 135–145)

## 2018-05-28 LAB — URINALYSIS, ROUTINE W REFLEX MICROSCOPIC
Bilirubin Urine: NEGATIVE
Glucose, UA: NEGATIVE mg/dL
Hgb urine dipstick: NEGATIVE
Ketones, ur: NEGATIVE mg/dL
Leukocytes, UA: NEGATIVE
Nitrite: NEGATIVE
Protein, ur: NEGATIVE mg/dL
Specific Gravity, Urine: 1.023 (ref 1.005–1.030)
pH: 5 (ref 5.0–8.0)

## 2018-05-28 MED ORDER — SODIUM CHLORIDE 0.9 % IV SOLN: 250.0000 mL | INTRAVENOUS | Status: DC | PRN

## 2018-05-28 MED ORDER — SODIUM CHLORIDE 0.9 % IV SOLN
1000.0000 mg | INTRAVENOUS | Status: AC
  Administered 2018-05-29: 1000 mg via INTRAVENOUS
  Filled 2018-05-28: qty 8

## 2018-05-28 MED ORDER — GADOBUTROL 1 MMOL/ML IV SOLN
8.0000 mL | Freq: Once | INTRAVENOUS | Status: AC | PRN
  Administered 2018-05-28: 8 mL via INTRAVENOUS

## 2018-05-28 MED ORDER — ACETAMINOPHEN 325 MG PO TABS
650.0000 mg | ORAL_TABLET | Freq: Four times a day (QID) | ORAL | Status: DC | PRN
  Administered 2018-05-29: 650 mg via ORAL
  Filled 2018-05-28: qty 2

## 2018-05-28 MED ORDER — SODIUM CHLORIDE 0.9% FLUSH: 3.0000 mL | INTRAVENOUS | Status: DC | PRN

## 2018-05-28 MED ORDER — SODIUM CHLORIDE 0.9% FLUSH
3.0000 mL | Freq: Two times a day (BID) | INTRAVENOUS | Status: DC
  Administered 2018-05-29 – 2018-05-31 (×5): 3 mL via INTRAVENOUS

## 2018-05-28 NOTE — ED Notes (Signed)
Patient agitated. States "I've been here for over 2 hours sitting in the hallway and no one else is in the hallway". Explained to patient we do have other people in the hallway. This is [facility]'s new policy. Once you have been seen by a provider in a room and deemed appropriative you are moved to a progression bed while you wait for results. Again, asked patient if he needed anything and all he will say is "I have MS and I want to talk to the doctor".

## 2018-05-28 NOTE — ED Triage Notes (Signed)
Per [facility] ems pt has  MS and has had multiple falls today, pt is on meds but does smoke a lot of pot, last used right before getting on ambulance, pt denies loc or hitting head, jst states that he cannot get his legs under him

## 2018-05-28 NOTE — ED Notes (Addendum)
When asked patient if he has pain patient states "I have MS". Asked him to clarify if anything specifically hurts and he again sates "I have MS".

## 2018-05-28 NOTE — H&P (Signed)
History and Physical    [name] MSX:[id number] DOB: 1980-10-05 DOA: 05/28/2018  PCP: Lonie Peak, PA-C  Patient coming from: Home  Chief Complaint: Right-sided weakness and falling a lot  HPI: [name] is a 38 y.o. male with medical history significant of multiple sclerosis, hypertension comes in with 2 days of progressive worsening right-sided weakness with right facial drooping that is been causing him to fall several times.  Patient denies any fevers.  He reports he has been having some vision changes.  He has a longstanding history of multiple sclerosis but it is never been this bad before.  Last time he was hospitalized for MS flare was over a year ago.  Patient found to have multiple sclerosis flare and neurological deficits.  Neurology has been called recommending IV Solu-Medrol and hospitalization.  Review of Systems: As per HPI otherwise 10 point review of systems negative.   Past Medical History:  Diagnosis Date  . Depression   . Diabetes mellitus without complication (HCC)   . Eye abnormalities    unspecified by pt  . Hypertension   . Multiple sclerosis (HCC)   . Weakness    generalized    Past Surgical History:  Procedure Laterality Date  . WISDOM TOOTH EXTRACTION       reports that he has been smoking cigarettes. He has been smoking about 1.00 pack per day. He has never used smokeless tobacco. He reports current alcohol use of about 12.0 standard drinks of alcohol per week. He reports current drug use. Frequency: 30.00 times per week. Drug: Marijuana.  No Known Allergies  Family History  Adopted: Yes  Problem Relation Age of Onset  . Multiple sclerosis Cousin   . Heart disease Mother   . Other Father        Does not know his father.    Prior to Admission medications   Medication Sig Start Date End Date Taking? Authorizing Provider  cyclobenzaprine (FLEXERIL) 10 MG tablet Take 1 tablet (10 mg total) by mouth 3 (three) times daily as needed for  muscle spasms. Patient not taking: Reported on 05/28/2018 11/17/17   Marily Memos, MD    Physical Exam: Vitals:   05/28/18 1715 05/28/18 1730 05/28/18 1815 05/28/18 2023  BP: (!) 150/95 (!) 152/84 (!) 154/87 (!) 157/99  Pulse: (!) 103 (!) 105 (!) 102 (!) 114  Resp:   16 15  Temp:      TempSrc:      SpO2: 96% 96% 95% 94%      Constitutional: NAD, calm, comfortable Vitals:   05/28/18 1715 05/28/18 1730 05/28/18 1815 05/28/18 2023  BP: (!) 150/95 (!) 152/84 (!) 154/87 (!) 157/99  Pulse: (!) 103 (!) 105 (!) 102 (!) 114  Resp:   16 15  Temp:      TempSrc:      SpO2: 96% 96% 95% 94%   Eyes: PERRL, lids and conjunctivae normal ENMT: Mucous membranes are moist. Posterior pharynx clear of any exudate or lesions.Normal dentition.  Neck: normal, supple, no masses, no thyromegaly Respiratory: clear to auscultation bilaterally, no wheezing, no crackles. Normal respiratory effort. No accessory muscle use.  Cardiovascular: Regular rate and rhythm, no murmurs / rubs / gallops. No extremity edema. 2+ pedal pulses. No carotid bruits.  Abdomen: no tenderness, no masses palpated. No hepatosplenomegaly. Bowel sounds positive.  Musculoskeletal: no clubbing / cyanosis. No joint deformity upper and lower extremities. Good ROM, no contractures. Normal muscle tone.  Skin: no rashes, lesions, ulcers. No induration  Neurologic: Right-sided facial droop sensation intact, DTR normal. Strength 5/5 in left 4-5 on the right upper and lower extremity Psychiatric: Normal judgment and insight. Alert and oriented x 3. Normal mood.    Labs on Admission: I have personally reviewed following labs and imaging studies  CBC: Recent Labs  Lab 05/28/18 1724  WBC 5.6  NEUTROABS 4.7  HGB 12.3*  HCT 38.9*  MCV 87.2  PLT 237   Basic Metabolic Panel: Recent Labs  Lab 05/28/18 1724  NA 140  K 4.0  CL 107  CO2 24  GLUCOSE 87  BUN 8  CREATININE 0.93  CALCIUM 9.2   GFR: CrCl cannot be calculated (Unknown  ideal weight.). Liver Function Tests: No results for input(s): AST, ALT, ALKPHOS, BILITOT, PROT, ALBUMIN in the last 168 hours. No results for input(s): LIPASE, AMYLASE in the last 168 hours. No results for input(s): AMMONIA in the last 168 hours. Coagulation Profile: No results for input(s): INR, PROTIME in the last 168 hours. Cardiac Enzymes: No results for input(s): CKTOTAL, CKMB, CKMBINDEX, TROPONINI in the last 168 hours. BNP (last 3 results) No results for input(s): PROBNP in the last 8760 hours. HbA1C: No results for input(s): HGBA1C in the last 72 hours. CBG: No results for input(s): GLUCAP in the last 168 hours. Lipid Profile: No results for input(s): CHOL, HDL, LDLCALC, TRIG, CHOLHDL, LDLDIRECT in the last 72 hours. Thyroid Function Tests: No results for input(s): TSH, T4TOTAL, FREET4, T3FREE, THYROIDAB in the last 72 hours. Anemia Panel: No results for input(s): VITAMINB12, FOLATE, FERRITIN, TIBC, IRON, RETICCTPCT in the last 72 hours. Urine analysis:    Component Value Date/Time   COLORURINE YELLOW 05/28/2018 1800   APPEARANCEUR HAZY (A) 05/28/2018 1800   APPEARANCEUR Clear 07/26/2016 1015   LABSPEC 1.023 05/28/2018 1800   PHURINE 5.0 05/28/2018 1800   GLUCOSEU NEGATIVE 05/28/2018 1800   HGBUR NEGATIVE 05/28/2018 1800   BILIRUBINUR NEGATIVE 05/28/2018 1800   BILIRUBINUR Negative 07/26/2016 1015   KETONESUR NEGATIVE 05/28/2018 1800   PROTEINUR NEGATIVE 05/28/2018 1800   UROBILINOGEN 1.0 02/26/2011 1750   NITRITE NEGATIVE 05/28/2018 1800   LEUKOCYTESUR NEGATIVE 05/28/2018 1800   LEUKOCYTESUR Trace (A) 07/26/2016 1015   Sepsis Labs: !!!!!!!!!!!!!!!!!!!!!!!!!!!!!!!!!!!!!!!!!!!! @LABRCNTIP (procalcitonin:4,lacticidven:4) )No results found for this or any previous visit (from the past 240 hour(s)).   Radiological Exams on Admission: [name] Contrast  Result Date: 05/28/2018 CLINICAL DATA:  Initial evaluation for multiple sclerosis, new neurologic event.  EXAM: MRI HEAD WITHOUT AND WITH CONTRAST TECHNIQUE: Multiplanar, multiecho pulse sequences of the brain and surrounding structures were obtained without and with intravenous contrast. CONTRAST:  80 cc of Gadavist. COMPARISON:  Prior MRI from 11/12/2017 FINDINGS: Brain: Advanced cerebral and cerebellar atrophy for age, stable. Extensive T2/FLAIR hyperintensities seen involving the periventricular, deep, and subcortical white matter both cerebral hemispheres in a distribution consistent with provided history of multiple sclerosis. Patchy involvement of the brainstem and cerebellum as well. Numerous T1 hypointense black holes. Overall, appearance is grossly stable from previous without evidence for significant disease progression. No diffusion abnormality or abnormal enhancement to suggest active demyelination. No mass lesion, midline shift or mass effect. No hydrocephalus. No extra-axial fluid collection. No evidence for acute or subacute ischemia. No other abnormal enhancement. Pituitary gland within normal limits. Midline structures intact. Vascular: Major intracranial vascular flow voids maintained. Skull and upper cervical spine: Craniocervical junction normal. Bone marrow signal intensity normal. No scalp soft tissue abnormality. Sinuses/Orbits: Globes and orbital soft tissues within normal limits. Paranasal sinuses are  largely clear. No significant mastoid effusion. Inner ear structures grossly normal. Other: None. IMPRESSION: 1. Findings consistent with advanced multiple sclerosis, relatively stable in appearance as compared to most recent MRI from 11/12/2017. No evidence for significant disease progression or active demyelination. 2. No other acute intracranial abnormality. Advanced atrophy for age. Electronically Signed   By: Rise Mu M.D.   On: 05/28/2018 20:12   Old chart reviewed Case discussed with Dr. Clayborne Dana in the ED  Assessment/Plan 38 year old male with right sided weakness comes in  with multiple sclerosis flare Principal Problem:   Multiple sclerosis exacerbation (HCC)-patient given IV Solu-Medrol emergency department.  Neurology has been called will be seeing the patient decide on further steroid regimen.  Obtain frequent neurological checks.  Obtain physical therapy and speech therapy evaluations.  Hopefully his neurological symptoms will improve with IV steroids.  Active Problems:   Hypertension-on no chronic meds      DVT prophylaxis: SCDs Code Status: Full Family Communication: None Disposition Plan: Several days Consults called: Neurology Admission status: Admission   Ercilia Bettinger A MD [facility]  If 7PM-7AM, please contact night-coverage [email or web address] Password [facility]  05/28/2018, 10:38 PM

## 2018-05-28 NOTE — ED Notes (Signed)
Patient transported to MRI 

## 2018-05-29 LAB — INFLUENZA PANEL BY PCR (TYPE A & B)
Influenza A By PCR: POSITIVE — AB
Influenza B By PCR: NEGATIVE

## 2018-05-29 LAB — CBC
HCT: 39.6 % (ref 39.0–52.0)
Hemoglobin: 12.8 g/dL — ABNORMAL LOW (ref 13.0–17.0)
MCH: 27.9 pg (ref 26.0–34.0)
MCHC: 32.3 g/dL (ref 30.0–36.0)
MCV: 86.3 fL (ref 80.0–100.0)
Platelets: 243 10*3/uL (ref 150–400)
RBC: 4.59 MIL/uL (ref 4.22–5.81)
RDW: 13.3 % (ref 11.5–15.5)
WBC: 4.2 10*3/uL (ref 4.0–10.5)
nRBC: 0 % (ref 0.0–0.2)

## 2018-05-29 LAB — BASIC METABOLIC PANEL
Anion gap: 12 (ref 5–15)
BUN: 9 mg/dL (ref 6–20)
CO2: 23 mmol/L (ref 22–32)
Calcium: 9 mg/dL (ref 8.9–10.3)
Chloride: 103 mmol/L (ref 98–111)
Creatinine, Ser: 0.99 mg/dL (ref 0.61–1.24)
Glucose, Bld: 181 mg/dL — ABNORMAL HIGH (ref 70–99)
Potassium: 4.1 mmol/L (ref 3.5–5.1)
Sodium: 138 mmol/L (ref 135–145)

## 2018-05-29 LAB — TROPONIN I

## 2018-05-29 MED ORDER — PANTOPRAZOLE SODIUM 40 MG PO TBEC
40.0000 mg | DELAYED_RELEASE_TABLET | Freq: Every day | ORAL | Status: DC
  Administered 2018-05-29 – 2018-05-31 (×3): 40 mg via ORAL
  Filled 2018-05-29 (×3): qty 1

## 2018-05-29 MED ORDER — SODIUM CHLORIDE 0.9 % IV SOLN
1000.0000 mg | Freq: Every day | INTRAVENOUS | Status: AC
  Administered 2018-05-29 – 2018-05-30 (×2): 1000 mg via INTRAVENOUS
  Filled 2018-05-29 (×2): qty 8

## 2018-05-29 MED ORDER — IBUPROFEN 200 MG PO TABS
600.0000 mg | ORAL_TABLET | Freq: Four times a day (QID) | ORAL | Status: DC | PRN
  Administered 2018-05-29: 600 mg via ORAL
  Filled 2018-05-29: qty 3

## 2018-05-29 MED ORDER — SODIUM CHLORIDE 0.9 % IV SOLN
INTRAVENOUS | Status: AC
  Administered 2018-05-29: 05:00:00 via INTRAVENOUS

## 2018-05-29 MED ORDER — OSELTAMIVIR PHOSPHATE 75 MG PO CAPS
75.0000 mg | ORAL_CAPSULE | Freq: Two times a day (BID) | ORAL | Status: DC
  Administered 2018-05-29 – 2018-05-31 (×5): 75 mg via ORAL
  Filled 2018-05-29 (×6): qty 1

## 2018-05-29 MED ORDER — BACLOFEN 10 MG PO TABS
5.0000 mg | ORAL_TABLET | Freq: Two times a day (BID) | ORAL | Status: DC
  Administered 2018-05-29 – 2018-05-31 (×6): 5 mg via ORAL
  Filled 2018-05-29 (×6): qty 1

## 2018-05-29 MED ORDER — IPRATROPIUM-ALBUTEROL 0.5-2.5 (3) MG/3ML IN SOLN
3.0000 mL | Freq: Four times a day (QID) | RESPIRATORY_TRACT | Status: DC
  Administered 2018-05-29 – 2018-05-30 (×5): 3 mL via RESPIRATORY_TRACT
  Filled 2018-05-29 (×5): qty 3

## 2018-05-29 MED ORDER — SODIUM CHLORIDE 0.9 % IV BOLUS
1000.0000 mL | Freq: Once | INTRAVENOUS | Status: AC
  Administered 2018-05-29: 1000 mL via INTRAVENOUS

## 2018-05-29 NOTE — Evaluation (Addendum)
Physical Therapy Evaluation Patient Details Name: [name] S Arcidiacono MRN: [id number] DOB: 10-09-1980 Today's Date: 05/29/2018   History of Present Illness  Pt is a 38 y/o male presenting with worsening R sided weakness. Patient was admitted for IV steroids for MS exacerbation.  Also found to be positive for influenza. PMH including but not limited to MS, DM and HTN.    Clinical Impression  Pt presented supine in bed with HOB elevated, awake and willing to participate in therapy session. Prior to admission, pt reported that he was independent with all functional mobility and ADLs. Pt lives with his mother in a single level home with a few steps to enter. He currently requires min guard for safety with bed mobility, min A for stability with transfers and min-mod A to ambulate a short distance within his room. Pt admitted to several falls over the past six months, "too many to count". Pt would greatly benefit from further intensive therapy services at [facility] to maximize his independence with functional mobility prior to returning home with family support. Pt would continue to benefit from skilled physical therapy services at this time while admitted and after d/c to address the below listed limitations in order to improve overall safety and independence with functional mobility.     Follow Up Recommendations CIR    Equipment Recommendations  None recommended by PT    Recommendations for Other Services Rehab consult     Precautions / Restrictions Precautions Precautions: Fall Restrictions Weight Bearing Restrictions: No      Mobility  Bed Mobility Overal bed mobility: Needs Assistance Bed Mobility: Supine to Sit     Supine to sit: Min guard     General bed mobility comments: for safety  Transfers Overall transfer level: Needs assistance Equipment used: None Transfers: Sit to/from Stand Sit to Stand: Min assist         General transfer comment: assist for stability with transition  into standing from EOB  Ambulation/Gait Ambulation/Gait assistance: Mod assist;Min assist Gait Distance (Feet): 20 Feet Assistive device: None Gait Pattern/deviations: Step-through pattern;Decreased step length - left;Decreased step length - right;Decreased stride length Gait velocity: decreased   General Gait Details: pt with modest instability requiring constant physical assistance without use of an AD; however, pt requesting to have one UE support on therapist's shoulder  Stairs            Wheelchair Mobility    Modified Rankin (Stroke Patients Only)       Balance Overall balance assessment: Needs assistance Sitting-balance support: Feet supported Sitting balance-Leahy Scale: Good     Standing balance support: During functional activity;Single extremity supported;Bilateral upper extremity supported Standing balance-Leahy Scale: Poor                               Pertinent Vitals/Pain Pain Assessment: No/denies pain    Home Living Family/patient expects to be discharged to:: Private residence Living Arrangements: Parent Available Help at Discharge: Friend(s);Family;Available 24 hours/day Type of Home: House Home Access: Stairs to enter Entrance Stairs-Rails: Right;Left;Can reach both Entrance Stairs-Number of Steps: 3 Home Layout: One level Home Equipment: None      Prior Function Level of Independence: Independent               Hand Dominance        Extremity/Trunk Assessment   Upper Extremity Assessment Upper Extremity Assessment: Overall WFL for tasks assessed    Lower Extremity Assessment Lower  Extremity Assessment: RLE deficits/detail RLE Deficits / Details: pt with 2/5 strength with hip flexion, 3/5 for knee flexion/extension    Cervical / Trunk Assessment Cervical / Trunk Assessment: Normal  Communication   Communication: No difficulties  Cognition Arousal/Alertness: Awake/alert Behavior During Therapy: WFL for tasks  assessed/performed Overall Cognitive Status: Impaired/Different from baseline Area of Impairment: Safety/judgement                         Safety/Judgement: Decreased awareness of deficits;Decreased awareness of safety            General Comments      Exercises     Assessment/Plan    PT Assessment Patient needs continued PT services  PT Problem List Decreased strength;Decreased balance;Decreased mobility;Decreased coordination;Decreased knowledge of use of DME;Decreased safety awareness;Decreased knowledge of precautions       PT Treatment Interventions DME instruction;Gait training;Stair training;Functional mobility training;Therapeutic activities;Therapeutic exercise;Balance training;Neuromuscular re-education;Patient/family education    PT Goals (Current goals can be found in the Care Plan section)  Acute Rehab PT Goals Patient Stated Goal: to get stronger PT Goal Formulation: With patient Time For Goal Achievement: 06/12/18 Potential to Achieve Goals: Good    Frequency Min 3X/week   Barriers to discharge        Co-evaluation               AM-PAC PT "6 Clicks" Mobility  Outcome Measure Help needed turning from your back to your side while in a flat bed without using bedrails?: None Help needed moving from lying on your back to sitting on the side of a flat bed without using bedrails?: None Help needed moving to and from a bed to a chair (including a wheelchair)?: A Little Help needed standing up from a chair using your arms (e.g., wheelchair or bedside chair)?: A Little Help needed to walk in hospital room?: A Lot Help needed climbing 3-5 steps with a railing? : Total 6 Click Score: 17    End of Session   Activity Tolerance: Patient tolerated treatment well Patient left: in bed;with call bell/phone within reach;with bed alarm set;Other (comment)(sitting EOB) Nurse Communication: Mobility status PT Visit Diagnosis: Other abnormalities of gait  and mobility (R26.89);Muscle weakness (generalized) (M62.81)    Time: 1610-9604 PT Time Calculation (min) (ACUTE ONLY): 35 min   Charges:   PT Evaluation $PT Eval Moderate Complexity: 1 Mod PT Treatments $Gait Training: 8-22 mins        Deborah Chalk, [place], DPT  Acute Rehabilitation Services Pager [phone] Office [phone]    [name] Claudis Giovanelli 05/29/2018, 4:39 PM

## 2018-05-29 NOTE — Progress Notes (Signed)
MEWS score 2, TRH MD notified of elevated temp and tachycardia, orders received. Jorge Ny

## 2018-05-29 NOTE — Progress Notes (Signed)
PROGRESS NOTE    [name]  FYB:[id number] DOB: [birth year] DOA: 05/28/2018 PCP: Lonie Peak, PA-C   Brief Narrative:  [name] is a 38 y.o. male with medical history significant of multiple sclerosis, hypertension comes in with 2 days of progressive worsening right-sided weakness with right facial drooping that is been causing him to fall several times.  Patient denies any fevers.  He reports he has been having some vision changes.  He has a longstanding history of multiple sclerosis but it is never been this bad before.  Last time he was hospitalized for MS flare was over a year ago.  Patient found to have multiple sclerosis flare and neurological deficits.  Neurology has been called recommending IV Solu-Medrol and hospitalization.  Assessment & Plan:   Principal Problem:   Multiple sclerosis exacerbation (HCC) Active Problems:   Hypertension   MS Exacerbation: Appreciate neuro recs 1 gram solumedrol daily x 3 days PT recommending CIR, will place consult  Influenza A Continue tamiflu  Hypertension Stable, continue to monitor off meds  DVT prophylaxis: SCD Code Status: full  Family Communication: none at bedside Disposition Plan: pending sign off by neurology, last dose of solumedrol   Consultants:   neurology  Procedures:   none  Antimicrobials:  Anti-infectives (From admission, onward)   Start     Dose/Rate Route Frequency Ordered Stop   05/29/18 0545  oseltamivir (TAMIFLU) capsule 75 mg     75 mg Oral 2 times daily 05/29/18 0530 06/03/18 0959         Subjective: Able to move around a bit more today Still not to baseline  Objective: Vitals:   05/29/18 0405 05/29/18 0757 05/29/18 1147 05/29/18 1627  BP: 135/78 113/81 (!) 137/92 (!) 149/81  Pulse: 91 84 67 62  Resp: 19 18 18 18   Temp: 98.8 F (37.1 C) 98 F (36.7 C) 98.4 F (36.9 C) 98.9 F (37.2 C)  TempSrc: Oral Oral Oral Oral  SpO2: 97% 100% 99% 100%  Weight:      Height:         Intake/Output Summary (Last 24 hours) at 05/29/2018 1833 Last data filed at 05/29/2018 1300 Gross per 24 hour  Intake 2120.74 ml  Output 700 ml  Net 1420.74 ml   Filed Weights   05/28/18 2300  Weight: 78.1 kg    Examination:  General exam: Appears calm and comfortable  Respiratory system: Clear to auscultation. Respiratory effort normal. Cardiovascular system: S1 & S2 heard, RRR. Gastrointestinal system: Abdomen is nondistended, soft and nontender.  Central nervous system: Alert and oriented. R>L sided weakness Extremities: no LEE Skin: No rashes, lesions or ulcers Psychiatry: Judgement and insight appear normal. Mood & affect appropriate.     Data Reviewed: I have personally reviewed following labs and imaging studies  CBC: Recent Labs  Lab 05/28/18 1724 05/29/18 0820  WBC 5.6 4.2  NEUTROABS 4.7  --   HGB 12.3* 12.8*  HCT 38.9* 39.6  MCV 87.2 86.3  PLT 237 243   Basic Metabolic Panel: Recent Labs  Lab 05/28/18 1724 05/29/18 0820  NA 140 138  K 4.0 4.1  CL 107 103  CO2 24 23  GLUCOSE 87 181*  BUN 8 9  CREATININE 0.93 0.99  CALCIUM 9.2 9.0   GFR: Estimated Creatinine Clearance: 105.5 mL/min (by C-G formula based on SCr of 0.99 mg/dL). Liver Function Tests: No results for input(s): AST, ALT, ALKPHOS, BILITOT, PROT, ALBUMIN in the last 168 hours. No results for input(s):  LIPASE, AMYLASE in the last 168 hours. No results for input(s): AMMONIA in the last 168 hours. Coagulation Profile: No results for input(s): INR, PROTIME in the last 168 hours. Cardiac Enzymes: Recent Labs  Lab 05/29/18 0015  TROPONINI <0.03   BNP (last 3 results) No results for input(s): PROBNP in the last 8760 hours. HbA1C: No results for input(s): HGBA1C in the last 72 hours. CBG: No results for input(s): GLUCAP in the last 168 hours. Lipid Profile: No results for input(s): CHOL, HDL, LDLCALC, TRIG, CHOLHDL, LDLDIRECT in the last 72 hours. Thyroid Function Tests: No  results for input(s): TSH, T4TOTAL, FREET4, T3FREE, THYROIDAB in the last 72 hours. Anemia Panel: No results for input(s): VITAMINB12, FOLATE, FERRITIN, TIBC, IRON, RETICCTPCT in the last 72 hours. Sepsis Labs: No results for input(s): PROCALCITON, LATICACIDVEN in the last 168 hours.  No results found for this or any previous visit (from the past 240 hour(s)).       Radiology Studies: [name] Contrast  Result Date: 05/28/2018 CLINICAL DATA:  Initial evaluation for multiple sclerosis, new neurologic event. EXAM: MRI HEAD WITHOUT AND WITH CONTRAST TECHNIQUE: Multiplanar, multiecho pulse sequences of the brain and surrounding structures were obtained without and with intravenous contrast. CONTRAST:  80 cc of Gadavist. COMPARISON:  Prior MRI from 11/12/2017 FINDINGS: Brain: Advanced cerebral and cerebellar atrophy for age, stable. Extensive T2/FLAIR hyperintensities seen involving the periventricular, deep, and subcortical white matter both cerebral hemispheres in a distribution consistent with provided history of multiple sclerosis. Patchy involvement of the brainstem and cerebellum as well. Numerous T1 hypointense black holes. Overall, appearance is grossly stable from previous without evidence for significant disease progression. No diffusion abnormality or abnormal enhancement to suggest active demyelination. No mass lesion, midline shift or mass effect. No hydrocephalus. No extra-axial fluid collection. No evidence for acute or subacute ischemia. No other abnormal enhancement. Pituitary gland within normal limits. Midline structures intact. Vascular: Major intracranial vascular flow voids maintained. Skull and upper cervical spine: Craniocervical junction normal. Bone marrow signal intensity normal. No scalp soft tissue abnormality. Sinuses/Orbits: Globes and orbital soft tissues within normal limits. Paranasal sinuses are largely clear. No significant mastoid effusion. Inner ear structures  grossly normal. Other: None. IMPRESSION: 1. Findings consistent with advanced multiple sclerosis, relatively stable in appearance as compared to most recent MRI from 11/12/2017. No evidence for significant disease progression or active demyelination. 2. No other acute intracranial abnormality. Advanced atrophy for age. Electronically Signed   By: Rise Mu M.D.   On: 05/28/2018 20:12        Scheduled Meds: . baclofen  5 mg Oral BID  . oseltamivir  75 mg Oral BID  . pantoprazole  40 mg Oral Daily  . sodium chloride flush  3 mL Intravenous Q12H   Continuous Infusions: . sodium chloride    . methylPREDNISolone (SOLU-MEDROL) injection 1,000 mg (05/29/18 1448)     LOS: 1 day    Time spent: over 30 min    Lacretia Nicks, MD [facility] Pager AMION  If 7PM-7AM, please contact night-coverage [email or web address] Password [facility] 05/29/2018, 6:33 PM

## 2018-05-29 NOTE — Evaluation (Signed)
Clinical/Bedside Swallow Evaluation Patient Details  Name: JESSY REDHOUSE MRN: [id number] Date of Birth: December 06, 1980  Today's Date: 05/29/2018 Time: SLP Start Time (ACUTE ONLY): 5400 SLP Stop Time (ACUTE ONLY): 0955 SLP Time Calculation (min) (ACUTE ONLY): 30 min  Past Medical History:  Past Medical History:  Diagnosis Date  . Depression   . Diabetes mellitus without complication (HCC)   . Eye abnormalities    unspecified by pt  . Hypertension   . Multiple sclerosis (HCC)   . Weakness    generalized   Past Surgical History:  Past Surgical History:  Procedure Laterality Date  . WISDOM TOOTH EXTRACTION     HPI:  38 year old male admitted 05/28/2018 with right weakness and frequent falls. PMH: MS, HTN.     Assessment / Plan / Recommendation Clinical Impression  Pt presents with adequate oral motor strength and function, with good dentition. Pt reports no history of difficulty swallowing. Upon admit, pt reported right weakness, including right anterior leakage and altered speech. Pt speech is currently intelligible, however, minimal dysarthria persists, which is baseline per pt. No obvious orofacial asymmetry noted. Pt accepted trials of thin liquid, puree, and solid. No obvious oral issues or overt s/s aspiration observed on any consistency. Recommend continuing with current diet (regular/thin), with adherence to routine safe swalow precautions. No further ST intervention recommended at this time. Please reconsult if needs arise.    SLP Visit Diagnosis: Dysphagia, unspecified (R13.10)    Aspiration Risk  Mild aspiration risk    Diet Recommendation Thin liquid;Regular   Liquid Administration via: Cup;Straw Medication Administration: Whole meds with liquid Supervision: Patient able to self feed Compensations: Minimize environmental distractions;Slow rate;Small sips/bites Postural Changes: Seated upright at 90 degrees    Other  Recommendations Oral Care Recommendations: Oral care  BID   Follow up Recommendations None          Prognosis Prognosis for Safe Diet Advancement: Good      Swallow Study   General Date of Onset: 05/28/18 HPI: 38 year old male admitted 05/28/2018 with right weakness and frequent falls. PMH: MS, HTN.   Type of Study: Bedside Swallow Evaluation Previous Swallow Assessment: none Diet Prior to this Study: Regular;Thin liquids Temperature Spikes Noted: No Respiratory Status: Room air History of Recent Intubation: No Behavior/Cognition: Alert;Cooperative;Pleasant mood Oral Cavity Assessment: Within Functional Limits Oral Care Completed by SLP: No Oral Cavity - Dentition: Adequate natural dentition Vision: Functional for self-feeding Self-Feeding Abilities: Able to feed self Patient Positioning: Upright in bed Baseline Vocal Quality: Normal Volitional Cough: Strong Volitional Swallow: Able to elicit    Oral/Motor/Sensory Function Overall Oral Motor/Sensory Function: Within functional limits   Ice Chips Ice chips: Not tested   Thin Liquid Thin Liquid: Within functional limits Presentation: Straw;Self Fed    Nectar Thick Nectar Thick Liquid: Not tested   Honey Thick Honey Thick Liquid: Not tested   Puree Puree: Within functional limits Presentation: Spoon;Self Fed   Solid     Solid: Within functional limits Presentation: Self Fed     Harmani Neto B. Murvin Natal [facility], CCC-SLP Speech Language Pathologist [phone]  Leigh Aurora 05/29/2018,10:06 AM

## 2018-05-29 NOTE — Consult Note (Signed)
Requesting Physician: Dr. Eldridge Daceachel David    Chief Complaint:   History obtained from: Patient and Chart     HPI:                                                                                                                                       [name] is an 38 y.o. male with past medical history of relapsing remitting MS on Tysabri being followed by Dr. Terrace ArabiaYan with poor compliance comes to the ED with worsening right-sided weakness and multiple falls since last 2 days.  Patient states that 2 days ago he was walking has been able to walk for the last 2 days.   Past Medical History:  Diagnosis Date  . Depression   . Diabetes mellitus without complication (HCC)   . Eye abnormalities    unspecified by pt  . Hypertension   . Multiple sclerosis (HCC)   . Weakness    generalized    Past Surgical History:  Procedure Laterality Date  . WISDOM TOOTH EXTRACTION      Family History  Adopted: Yes  Problem Relation Age of Onset  . Multiple sclerosis Cousin   . Heart disease Mother   . Other Father        Does not know his father.   Social History:  reports that he has been smoking cigarettes. He has been smoking about 1.00 pack per day. He has never used smokeless tobacco. He reports current alcohol use of about 12.0 standard drinks of alcohol per week. He reports current drug use. Frequency: 30.00 times per week. Drug: Marijuana.  Allergies: No Known Allergies  Medications:                                                                                                                        I reviewed home medications   ROS:  14 systems reviewed and negative except above    Examination:                                                                                                      General: Appears well-developed and well-nourished.  Psych:  Affect appropriate to situation Eyes: No scleral injection HENT: No OP obstrucion Head: Normocephalic.  Cardiovascular: Normal rate and regular rhythm.  Respiratory: Effort normal and breath sounds normal to anterior ascultation GI: Soft.  No distension. There is no tenderness.  Skin: WDI    Neurological Examination Mental Status: Alert, oriented, thought content appropriate.  Speech dysarthric and slow but no evidence of aphasia. Able to follow 3 step commands without difficulty. Cranial Nerves: II: Visual fields grossly normal, states that both eyes were blurry III,IV, VI: ptosis not present, extra-ocular motions intact bilaterally, pupils equal, round, reactive to light and accommodation V,VII:No Facial droop VIII: hearing normal bilaterally IX,X: uvula rises symmetrically XI: bilateral shoulder shrug XII: midline tongue extension Motor: Right : Upper extremity   4+/5    Left:     Upper extremity   4+/5  Lower extremity   5/5     Lower extremity   4+/5 Tone and bulk: Spasticity particularly in the right lower extremity Sensory: Pinprick and light touch intact throughout, bilaterally Deep Tendon Reflexes: bilateral patellar reflexes 3+ ghout Plantars: Right: downgoing   Left: downgoing Cerebellar: Ataxia with finger-to-nose as well as heel-to-shin Gait: Not assessed     Lab Results: Basic Metabolic Panel: Recent Labs  Lab 05/28/18 1724  NA 140  K 4.0  CL 107  CO2 24  GLUCOSE 87  BUN 8  CREATININE 0.93  CALCIUM 9.2    CBC: Recent Labs  Lab 05/28/18 1724  WBC 5.6  NEUTROABS 4.7  HGB 12.3*  HCT 38.9*  MCV 87.2  PLT 237    Coagulation Studies: No results for input(s): LABPROT, INR in the last 72 hours.  Imaging: [name] Contrast  Result Date: 05/28/2018 CLINICAL DATA:  Initial evaluation for multiple sclerosis, new neurologic event. EXAM: MRI HEAD WITHOUT AND WITH CONTRAST TECHNIQUE: Multiplanar, multiecho pulse sequences of the brain and  surrounding structures were obtained without and with intravenous contrast. CONTRAST:  80 cc of Gadavist. COMPARISON:  Prior MRI from 11/12/2017 FINDINGS: Brain: Advanced cerebral and cerebellar atrophy for age, stable. Extensive T2/FLAIR hyperintensities seen involving the periventricular, deep, and subcortical white matter both cerebral hemispheres in a distribution consistent with provided history of multiple sclerosis. Patchy involvement of the brainstem and cerebellum as well. Numerous T1 hypointense black holes. Overall, appearance is grossly stable from previous without evidence for significant disease progression. No diffusion abnormality or abnormal enhancement to suggest active demyelination. No mass lesion, midline shift or mass effect. No hydrocephalus. No extra-axial fluid collection. No evidence for acute or subacute ischemia. No other abnormal enhancement. Pituitary gland within normal limits. Midline structures intact. Vascular: Major intracranial vascular flow voids maintained. Skull and upper cervical spine: Craniocervical junction normal. Bone marrow signal intensity normal. No scalp soft tissue abnormality. Sinuses/Orbits: Globes and  orbital soft tissues within normal limits. Paranasal sinuses are largely clear. No significant mastoid effusion. Inner ear structures grossly normal. Other: None. IMPRESSION: 1. Findings consistent with advanced multiple sclerosis, relatively stable in appearance as compared to most recent MRI from 11/12/2017. No evidence for significant disease progression or active demyelination. 2. No other acute intracranial abnormality. Advanced atrophy for age. Electronically Signed   By: Rise MuBenjamin  McClintock M.D.   On: 05/28/2018 20:12     I have reviewed the above imaging : MRI brain with and without contrast    ASSESSMENT AND PLAN   [age] y Male with chronic relapsing remitting MS, spasticity presents for inability to walk, worsening MS symptoms.  MRI brain with and  without contrast shows stable lesions.  MRI C-spine was not performed, however recently done on July 2019 showed no acute lesions.  . Patient was admitted for IV steroids for MS exacerbation.  Also found to be positive for influenza.  Currently on Tysabri infusion and follows Dr Bluford KaufmannYan-however it appears that he has not followed up for over a year.  There was a plan for starting lentrada  looking at care everywhere notes, but [facility]  MS exacerbation versus pseudo-exacerbation Influenza  -If okay by medicine service to give steroids while being treated for influenza, then will continue 1 g Solu-Medrol for 2 more doses.  Spoke to Dr. Onalee Huaavid who is in agreement to start steroids. -Continue to treat influenza -PT/OT -Baclofen 5 mg twice daily for spasticity -Watch for hyperglycemia and hypertension while receiving steroids -We will start PPI     [name] [facility] Pager Number [phone]

## 2018-05-29 NOTE — Progress Notes (Signed)
MEWS/VS Documentation      05/28/2018 1815 05/28/2018 2023 05/28/2018 2331 05/29/2018 0130   MEWS Score:  1  2  2  3    MEWS Score Color:  Green  Yellow  Yellow  Yellow   Resp:  16  15  15  17    Pulse:  (!) 102  (!) 114  (!) 109  (!) 120   BP:  (!) 154/87  (!) 157/99  (!) 152/86  (!) 147/82   Temp:  -  -  (!) 101.2 F (38.4 C)  (!) 101.4 F (38.6 C)   O2 Device:  -  Room Armed forces technical officer   Level of Consciousness:  -  -  Alert  Alert     TRH provider notified of MEWS 3, Provider on floor to see patient. Jorge Ny

## 2018-05-29 NOTE — Progress Notes (Signed)
Rehab Admissions Coordinator Note:  Per PT recommendation, this patient was screened by Nanine Means for appropriateness for an Inpatient Acute Rehab Consult.  At this time, we are recommending Inpatient Rehab consult. AC will contact MD regarding request for IP Rehab Consult Order.   Nanine Means 05/29/2018, 5:07 PM  I can be reached at [phone].

## 2018-05-29 NOTE — ED Provider Notes (Signed)
[facility]ESS[facility]ARE Provider Note   CSN: [id number] Arrival date & time: 05/28/18  1507     History   Chief Complaint Chief Complaint  Patient presents with  . Fall    HPI [name] is a 38 y.o. male.  HPI Patient presents to the emergency department with MS that he feels like is been exacerbated over the last week.  Patient states he is having weakness on the right side especially and trouble talking.  Patient states that he was supposed to be taking medications but has not been because he is in between neurologist.  Patient states that nothing seems make the condition better or worse.  Patient states that he has not seen a neurologist in quite a while.  The patient denies chest pain, shortness of breath, headache,blurred vision, neck pain, fever, cough, weakness, numbness, dizziness, anorexia, edema, abdominal pain, nausea, vomiting, diarrhea, rash, back pain, dysuria, hematemesis, bloody stool, near syncope, or syncope. Past Medical History:  Diagnosis Date  . Depression   . Diabetes mellitus without complication (HCC)   . Eye abnormalities    unspecified by pt  . Hypertension   . Multiple sclerosis (HCC)   . Weakness    generalized    Patient Active Problem List   Diagnosis Date Noted  . Muscle spasm 11/17/2017  . Homicidal ideation 11/17/2017  . Dysesthesia   . Muscle spasms of neck   . Neuropathic pain   . Vitamin B12 deficiency   . Hypoalbuminemia   . Urinary frequency   . Tobacco abuse   . Steroid-induced hyperglycemia   . Marijuana abuse, continuous 04/12/2017  . Vitamin D deficiency 04/12/2017  . Multiple sclerosis exacerbation (HCC)   . Abnormality of gait 05/10/2016  . Weakness   . Hypertension   . Depression   . Multiple sclerosis (HCC) 03/02/2011    Past Surgical History:  Procedure Laterality Date  . WISDOM TOOTH EXTRACTION          Home Medications    Prior to Admission medications   Medication Sig Start Date End Date  Taking? Authorizing Provider  cyclobenzaprine (FLEXERIL) 10 MG tablet Take 1 tablet (10 mg total) by mouth 3 (three) times daily as needed for muscle spasms. Patient not taking: Reported on 05/28/2018 11/17/17   Mesner, Barbara Cower, MD    Family History Family History  Adopted: Yes  Problem Relation Age of Onset  . Multiple sclerosis Cousin   . Heart disease Mother   . Other Father        Does not know his father.    Social History Social History   Tobacco Use  . Smoking status: Current Every Day Smoker    Packs/day: 1.00    Types: Cigarettes  . Smokeless tobacco: Never Used  Substance Use Topics  . Alcohol use: Yes    Alcohol/week: 12.0 standard drinks    Types: 12 Cans of beer per week  . Drug use: Yes    Frequency: 30.0 times per week    Types: Marijuana    Comment: Daily     Allergies   Patient has no known allergies.   Review of Systems Review of Systems All other systems negative except as documented in the HPI. All pertinent positives and negatives as reviewed in the HPI.  Physical Exam Updated Vital Signs BP 136/79 (BP Location: Left Arm)   Pulse (!) 55   Temp 98.5 F (36.9 C) (Oral)   Resp 18   Ht 5\' 10"  (  1.778 m)   Wt 78.1 kg   SpO2 98%   BMI 24.71 kg/m   Physical Exam Vitals signs and nursing note reviewed.  Constitutional:      General: He is not in acute distress.    Appearance: He is well-developed.  HENT:     Head: Normocephalic and atraumatic.  Eyes:     Pupils: Pupils are equal, round, and reactive to light.  Neck:     Musculoskeletal: Normal range of motion and neck supple.  Cardiovascular:     Rate and Rhythm: Normal rate and regular rhythm.     Heart sounds: Normal heart sounds. No murmur. No friction rub. No gallop.   Pulmonary:     Effort: Pulmonary effort is normal. No respiratory distress.     Breath sounds: Normal breath sounds. No wheezing.  Abdominal:     General: Bowel sounds are normal. There is no distension.      Palpations: Abdomen is soft.     Tenderness: There is no abdominal tenderness.  Skin:    General: Skin is warm and dry.     Capillary Refill: Capillary refill takes less than 2 seconds.     Findings: No erythema or rash.  Neurological:     Mental Status: He is alert and oriented to person, place, and time.     GCS: GCS eye subscore is 4. GCS verbal subscore is 5. GCS motor subscore is 6.     Sensory: Sensation is intact.     Motor: Weakness present. No abnormal muscle tone.     Coordination: Coordination normal.  Psychiatric:        Behavior: Behavior normal.      ED Treatments / Results  Labs (all labs ordered are listed, but only abnormal results are displayed) Labs Reviewed  CBC WITH DIFFERENTIAL/PLATELET - Abnormal; Notable for the following components:      Result Value   Hemoglobin 12.3 (*)    HCT 38.9 (*)    Lymphs Abs 0.5 (*)    All other components within normal limits  URINALYSIS, ROUTINE W REFLEX MICROSCOPIC - Abnormal; Notable for the following components:   APPearance HAZY (*)    All other components within normal limits  INFLUENZA PANEL BY PCR (TYPE A & B) - Abnormal; Notable for the following components:   Influenza A By PCR POSITIVE (*)    All other components within normal limits  CBC - Abnormal; Notable for the following components:   Hemoglobin 12.8 (*)    All other components within normal limits  BASIC METABOLIC PANEL - Abnormal; Notable for the following components:   Glucose, Bld 181 (*)    All other components within normal limits  CULTURE, BLOOD (ROUTINE X 2)  CULTURE, BLOOD (ROUTINE X 2)  BASIC METABOLIC PANEL  TROPONIN I  CBC  COMPREHENSIVE METABOLIC PANEL  MAGNESIUM    EKG None  Radiology [name] Contrast  Result Date: 05/28/2018 CLINICAL DATA:  Initial evaluation for multiple sclerosis, new neurologic event. EXAM: MRI HEAD WITHOUT AND WITH CONTRAST TECHNIQUE: Multiplanar, multiecho pulse sequences of the brain and  surrounding structures were obtained without and with intravenous contrast. CONTRAST:  80 cc of Gadavist. COMPARISON:  Prior MRI from 11/12/2017 FINDINGS: Brain: Advanced cerebral and cerebellar atrophy for age, stable. Extensive T2/FLAIR hyperintensities seen involving the periventricular, deep, and subcortical white matter both cerebral hemispheres in a distribution consistent with provided history of multiple sclerosis. Patchy involvement of the brainstem and cerebellum as well.  Numerous T1 hypointense black holes. Overall, appearance is grossly stable from previous without evidence for significant disease progression. No diffusion abnormality or abnormal enhancement to suggest active demyelination. No mass lesion, midline shift or mass effect. No hydrocephalus. No extra-axial fluid collection. No evidence for acute or subacute ischemia. No other abnormal enhancement. Pituitary gland within normal limits. Midline structures intact. Vascular: Major intracranial vascular flow voids maintained. Skull and upper cervical spine: Craniocervical junction normal. Bone marrow signal intensity normal. No scalp soft tissue abnormality. Sinuses/Orbits: Globes and orbital soft tissues within normal limits. Paranasal sinuses are largely clear. No significant mastoid effusion. Inner ear structures grossly normal. Other: None. IMPRESSION: 1. Findings consistent with advanced multiple sclerosis, relatively stable in appearance as compared to most recent MRI from 11/12/2017. No evidence for significant disease progression or active demyelination. 2. No other acute intracranial abnormality. Advanced atrophy for age. Electronically Signed   By: Rise MuBenjamin  McClintock M.D.   On: 05/28/2018 20:12    Procedures Procedures (including critical care time)  Medications Ordered in ED Medications  sodium chloride flush (NS) 0.9 % injection 3 mL (3 mLs Intravenous Given 05/29/18 2028)  sodium chloride flush (NS) 0.9 % injection 3 mL (has  no administration in time range)  0.9 %  sodium chloride infusion (has no administration in time range)  acetaminophen (TYLENOL) tablet 650 mg (650 mg Oral Given 05/29/18 0034)  ibuprofen (ADVIL,MOTRIN) tablet 600 mg (600 mg Oral Given 05/29/18 0251)  0.9 %  sodium chloride infusion ( Intravenous New Bag/Given 05/29/18 0500)  baclofen (LIORESAL) tablet 5 mg (5 mg Oral Given 05/29/18 2024)  pantoprazole (PROTONIX) EC tablet 40 mg (40 mg Oral Given 05/29/18 1021)  oseltamivir (TAMIFLU) capsule 75 mg (75 mg Oral Given 05/29/18 2025)  methylPREDNISolone sodium succinate (SOLU-MEDROL) 1,000 mg in sodium chloride 0.9 % 50 mL IVPB (1,000 mg Intravenous New Bag/Given 05/29/18 1448)  ipratropium-albuterol (DUONEB) 0.5-2.5 (3) MG/3ML nebulizer solution 3 mL (3 mLs Nebulization Given 05/29/18 2026)  gadobutrol (GADAVIST) 1 MMOL/ML injection 8 mL (8 mLs Intravenous Contrast Given 05/28/18 1930)  methylPREDNISolone sodium succinate (SOLU-MEDROL) 1,000 mg in sodium chloride 0.9 % 50 mL IVPB ( Intravenous Stopped 05/29/18 0135)  sodium chloride 0.9 % bolus 1,000 mL ( Intravenous Rate/Dose Verify 05/29/18 0505)     Initial Impression / Assessment and Plan / ED Course  I have reviewed the triage vital signs and the nursing notes.  Pertinent labs & imaging results that were available during my care of the patient were reviewed by me and considered in my medical decision making (see chart for details).     Patient will be admitted to the hospital for IV steroids.  I spoke with neurology who will see the patient.  Also spoke with [facility] hospitalist who will admit the patient for further evaluation and care.  Final Clinical Impressions(s) / ED Diagnoses   Final diagnoses:  None    ED Discharge Orders    None       Charlestine NightLawyer, Bettie Swavely, PA-C 05/29/18 2346    Arby BarrettePfeiffer, Marcy, MD 06/02/18 [phone]

## 2018-05-30 DIAGNOSIS — I10 Essential (primary) hypertension: Secondary | ICD-10-CM

## 2018-05-30 DIAGNOSIS — G35 Multiple sclerosis: Principal | ICD-10-CM

## 2018-05-30 LAB — CBC
HCT: 39.9 % (ref 39.0–52.0)
HEMOGLOBIN: 12.8 g/dL — AB (ref 13.0–17.0)
MCH: 27.5 pg (ref 26.0–34.0)
MCHC: 32.1 g/dL (ref 30.0–36.0)
MCV: 85.8 fL (ref 80.0–100.0)
Platelets: 236 10*3/uL (ref 150–400)
RBC: 4.65 MIL/uL (ref 4.22–5.81)
RDW: 13.4 % (ref 11.5–15.5)
WBC: 10.2 10*3/uL (ref 4.0–10.5)
nRBC: 0 % (ref 0.0–0.2)

## 2018-05-30 LAB — COMPREHENSIVE METABOLIC PANEL
ALK PHOS: 44 U/L (ref 38–126)
ALT: 18 U/L (ref 0–44)
AST: 30 U/L (ref 15–41)
Albumin: 3.4 g/dL — ABNORMAL LOW (ref 3.5–5.0)
Anion gap: 11 (ref 5–15)
BUN: 12 mg/dL (ref 6–20)
CALCIUM: 9 mg/dL (ref 8.9–10.3)
CO2: 25 mmol/L (ref 22–32)
Chloride: 104 mmol/L (ref 98–111)
Creatinine, Ser: 0.96 mg/dL (ref 0.61–1.24)
GFR calc Af Amer: >60 mL/min (ref >60)
GFR calc non Af Amer: >60 mL/min (ref >60)
Glucose, Bld: 165 mg/dL — ABNORMAL HIGH (ref 70–99)
Potassium: 4.1 mmol/L (ref 3.5–5.1)
Sodium: 140 mmol/L (ref 135–145)
Total Bilirubin: 0.4 mg/dL (ref 0.3–1.2)
Total Protein: 6.9 g/dL (ref 6.5–8.1)

## 2018-05-30 LAB — MAGNESIUM: Magnesium: 1.8 mg/dL (ref 1.7–2.4)

## 2018-05-30 MED ORDER — GUAIFENESIN 100 MG/5ML PO SOLN
10.0000 mL | ORAL | Status: DC | PRN
  Administered 2018-05-30 – 2018-05-31 (×2): 200 mg via ORAL
  Filled 2018-05-30: qty 10
  Filled 2018-05-30: qty 50

## 2018-05-30 NOTE — Progress Notes (Signed)
PROGRESS NOTE    EESHAN GINTY  MGQ:[id number] DOB: 1981/01/18 DOA: 05/28/2018 PCP: Lonie Peak, PA-C   Brief Narrative:  [name] is a 38 y.o. male with medical history significant of multiple sclerosis, hypertension comes in with 2 days of progressive worsening right-sided weakness with right facial drooping that is been causing him to fall several times.  Patient denies any fevers.  He reports he has been having some vision changes.  He has a longstanding history of multiple sclerosis but it is never been this bad before.  Last time he was hospitalized for MS flare was over a year ago.  Patient found to have multiple sclerosis flare and neurological deficits.  Neurology has been called recommending IV Solu-Medrol and hospitalization.  Assessment & Plan:   Principal Problem:   Multiple sclerosis exacerbation (HCC) Active Problems:   Hypertension   MS Exacerbation: Appreciate neuro recs - recommending outpatient neurology follow up Pt has some distrust in general when it comes to his care for his MS, will need to continue to encourage follow up outpatient for long term disease modifying therapy 1 gram solumedrol daily x 3 days Pending CIR placement  Influenza A Continue tamiflu  Hypertension Stable, continue to monitor off meds  DVT prophylaxis: SCD Code Status: full  Family Communication: none at bedside Disposition Plan: awaiting CIR placement   Consultants:   neurology  Procedures:   none  Antimicrobials:  Anti-infectives (From admission, onward)   Start     Dose/Rate Route Frequency Ordered Stop   05/29/18 0545  oseltamivir (TAMIFLU) capsule 75 mg     75 mg Oral 2 times daily 05/29/18 0530 06/03/18 0959         Subjective: Feels a bit better.  Objective: Vitals:   05/30/18 0820 05/30/18 1221 05/30/18 1536 05/30/18 1626  BP:  137/68  129/65  Pulse:  (!) 58  63  Resp:  18  18  Temp:  98.3 F (36.8 C)  98.4 F (36.9 C)  TempSrc:  Oral  Oral    SpO2: 100% 99% 99% 95%  Weight:      Height:        Intake/Output Summary (Last 24 hours) at 05/30/2018 1733 Last data filed at 05/30/2018 1600 Gross per 24 hour  Intake 1020 ml  Output 2370 ml  Net -1350 ml   Filed Weights   05/28/18 2300  Weight: 78.1 kg    Examination:  General: No acute distress. Cardiovascular: Heart sounds show a regular rate, and rhythm. Lungs: Clear to auscultation bilaterally with good air movement.  Abdomen: Soft, nontender, nondistended  Neurological: Alert and oriented 3. R sided weakness Skin: Warm and dry. No rashes or lesions. Extremities: No clubbing or cyanosis. No edema.  Psychiatric: Mood and affect are normal. Insight and judgment are appropriate.   Data Reviewed: I have personally reviewed following labs and imaging studies  CBC: Recent Labs  Lab 05/28/18 1724 05/29/18 0820 05/30/18 0716  WBC 5.6 4.2 10.2  NEUTROABS 4.7  --   --   HGB 12.3* 12.8* 12.8*  HCT 38.9* 39.6 39.9  MCV 87.2 86.3 85.8  PLT 237 243 236   Basic Metabolic Panel: Recent Labs  Lab 05/28/18 1724 05/29/18 0820 05/30/18 0716  NA 140 138 140  K 4.0 4.1 4.1  CL 107 103 104  CO2 24 23 25   GLUCOSE 87 181* 165*  BUN 8 9 12   CREATININE 0.93 0.99 0.96  CALCIUM 9.2 9.0 9.0  MG  --   --  1.8   GFR: Estimated Creatinine Clearance: 108.8 mL/min (by C-G formula based on SCr of 0.96 mg/dL). Liver Function Tests: Recent Labs  Lab 05/30/18 0716  AST 30  ALT 18  ALKPHOS 44  BILITOT 0.4  PROT 6.9  ALBUMIN 3.4*   No results for input(s): LIPASE, AMYLASE in the last 168 hours. No results for input(s): AMMONIA in the last 168 hours. Coagulation Profile: No results for input(s): INR, PROTIME in the last 168 hours. Cardiac Enzymes: Recent Labs  Lab 05/29/18 0015  TROPONINI <0.03   BNP (last 3 results) No results for input(s): PROBNP in the last 8760 hours. HbA1C: No results for input(s): HGBA1C in the last 72 hours. CBG: No results for input(s):  GLUCAP in the last 168 hours. Lipid Profile: No results for input(s): CHOL, HDL, LDLCALC, TRIG, CHOLHDL, LDLDIRECT in the last 72 hours. Thyroid Function Tests: No results for input(s): TSH, T4TOTAL, FREET4, T3FREE, THYROIDAB in the last 72 hours. Anemia Panel: No results for input(s): VITAMINB12, FOLATE, FERRITIN, TIBC, IRON, RETICCTPCT in the last 72 hours. Sepsis Labs: No results for input(s): PROCALCITON, LATICACIDVEN in the last 168 hours.  Recent Results (from the past 240 hour(s))  Culture, blood (routine x 2)     Status: None (Preliminary result)   Collection Time: 05/29/18 12:15 AM  Result Value Ref Range Status   Specimen Description BLOOD LEFT ARM  Final   Special Requests   Final    BOTTLES DRAWN AEROBIC AND ANAEROBIC Blood Culture adequate volume   Culture   Final    NO GROWTH 1 DAY Performed at [facility], [address] [place], [place] [address]    Report Status PENDING  Incomplete  Culture, blood (routine x 2)     Status: None (Preliminary result)   Collection Time: 05/29/18 12:20 AM  Result Value Ref Range Status   Specimen Description BLOOD RIGHT ARM  Final   Special Requests   Final    BOTTLES DRAWN AEROBIC AND ANAEROBIC Blood Culture adequate volume   Culture   Final    NO GROWTH 1 DAY Performed at [facility], [address] [place], [place] [address]    Report Status PENDING  Incomplete         Radiology Studies: [name] Contrast  Result Date: 05/28/2018 CLINICAL DATA:  Initial evaluation for multiple sclerosis, new neurologic event. EXAM: MRI HEAD WITHOUT AND WITH CONTRAST TECHNIQUE: Multiplanar, multiecho pulse sequences of the brain and surrounding structures were obtained without and with intravenous contrast. CONTRAST:  80 cc of Gadavist. COMPARISON:  Prior MRI from 11/12/2017 FINDINGS: Brain: Advanced cerebral and cerebellar atrophy for age, stable. Extensive T2/FLAIR hyperintensities seen involving the periventricular,  deep, and subcortical white matter both cerebral hemispheres in a distribution consistent with provided history of multiple sclerosis. Patchy involvement of the brainstem and cerebellum as well. Numerous T1 hypointense black holes. Overall, appearance is grossly stable from previous without evidence for significant disease progression. No diffusion abnormality or abnormal enhancement to suggest active demyelination. No mass lesion, midline shift or mass effect. No hydrocephalus. No extra-axial fluid collection. No evidence for acute or subacute ischemia. No other abnormal enhancement. Pituitary gland within normal limits. Midline structures intact. Vascular: Major intracranial vascular flow voids maintained. Skull and upper cervical spine: Craniocervical junction normal. Bone marrow signal intensity normal. No scalp soft tissue abnormality. Sinuses/Orbits: Globes and orbital soft tissues within normal limits. Paranasal sinuses are largely clear. No significant mastoid effusion. Inner ear  structures grossly normal. Other: None. IMPRESSION: 1. Findings consistent with advanced multiple sclerosis, relatively stable in appearance as compared to most recent MRI from 11/12/2017. No evidence for significant disease progression or active demyelination. 2. No other acute intracranial abnormality. Advanced atrophy for age. Electronically Signed   By: Rise MuBenjamin  McClintock M.D.   On: 05/28/2018 20:12        Scheduled Meds: . baclofen  5 mg Oral BID  . ipratropium-albuterol  3 mL Nebulization Q6H  . oseltamivir  75 mg Oral BID  . pantoprazole  40 mg Oral Daily  . sodium chloride flush  3 mL Intravenous Q12H   Continuous Infusions: . sodium chloride       LOS: 2 days    Time spent: over 30 min    Lacretia Nicksaldwell Powell, MD [facility] Pager AMION  If 7PM-7AM, please contact night-coverage [email or web address] Password [email or web address] 05/30/2018, 5:33 PM

## 2018-05-30 NOTE — Consult Note (Signed)
Physical Medicine and Rehabilitation Consult Reason for Consult:  Decreased functional mobility Referring Physician: [facility]   HPI: [name] is a 38 y.o.right handed male with history of hypertension, diabetes mellitus, multiple sclerosis diagnosed 2010 maintained on Tysabri and  followed by neurology services Dr.Yan with poor compliance and most recent admission for MS exacerbation and received inpatient rehabilitation services 04/17/2017 04/25/2017. Per chart review patient lives with his mother. Reportedly independent prior to admission. One level home with 3 steps to entry. Mother does receive hemodialysis. Presented 05/28/2018 with increasing right sided weakness and multiple falls. MRI of the brain findings consistent with advanced multiple sclerosis relatively stable in appearance as compared to most recent MRI from 11/12/2017. No evidence for significant disease progression or active demyelination. Neurology consulted placed on intravenous Solu-Medrol for MS exacerbation. Influenza panel positive and placed on Tamiflu. Therapy evaluations completed with recommendations of physical medicine rehabilitation consult.   Review of Systems  Constitutional: Negative for fever.  HENT: Negative for hearing loss.   Eyes: Negative for blurred vision and double vision.  Cardiovascular: Positive for leg swelling. Negative for chest pain and claudication.  Gastrointestinal: Positive for constipation. Negative for nausea.  Genitourinary: Positive for urgency. Negative for dysuria, flank pain and hematuria.  Musculoskeletal: Positive for falls.  Skin: Negative for rash.  Neurological: Positive for weakness.  Psychiatric/Behavioral: Positive for depression.       Anxiety  All other systems reviewed and are negative.  Past Medical History:  Diagnosis Date  . Depression   . Diabetes mellitus without complication (HCC)   . Eye abnormalities    unspecified by pt  . Hypertension   .  Multiple sclerosis (HCC)   . Weakness    generalized   Past Surgical History:  Procedure Laterality Date  . WISDOM TOOTH EXTRACTION     Family History  Adopted: Yes  Problem Relation Age of Onset  . Multiple sclerosis Cousin   . Heart disease Mother   . Other Father        Does not know his father.   Social History:  reports that he has been smoking cigarettes. He has been smoking about 1.00 pack per day. He has never used smokeless tobacco. He reports current alcohol use of about 12.0 standard drinks of alcohol per week. He reports current drug use. Frequency: 30.00 times per week. Drug: Marijuana. Allergies: No Known Allergies Medications Prior to Admission  Medication Sig Dispense Refill  . cyclobenzaprine (FLEXERIL) 10 MG tablet Take 1 tablet (10 mg total) by mouth 3 (three) times daily as needed for muscle spasms. (Patient not taking: Reported on 05/28/2018) 20 tablet 0    Home: Home Living Family/patient expects to be discharged to:: Private residence Living Arrangements: Parent Available Help at Discharge: Friend(s), Family, Available 24 hours/day Type of Home: House Home Access: Stairs to enter Secretary/administrator of Steps: 3 Entrance Stairs-Rails: Right, Left, Can reach both Home Layout: One level Bathroom Shower/Tub: Tub/shower unit Home Equipment: None  Functional History: Prior Function Level of Independence: Independent Functional Status:  Mobility: Bed Mobility Overal bed mobility: Needs Assistance Bed Mobility: Supine to Sit Supine to sit: Min guard General bed mobility comments: for safety Transfers Overall transfer level: Needs assistance Equipment used: None Transfers: Sit to/from Stand Sit to Stand: Min assist General transfer comment: assist for stability with transition into standing from EOB Ambulation/Gait Ambulation/Gait assistance: Mod assist, Min assist Gait Distance (Feet): 20 Feet Assistive device: None Gait Pattern/deviations:  Step-through pattern,  Decreased step length - left, Decreased step length - right, Decreased stride length General Gait Details: pt with modest instability requiring constant physical assistance without use of an AD; however, pt requesting to have one UE support on therapist's shoulder Gait velocity: decreased    ADL:    Cognition: Cognition Overall Cognitive Status: Impaired/Different from baseline Orientation Level: Oriented X4 Cognition Arousal/Alertness: Awake/alert Behavior During Therapy: WFL for tasks assessed/performed Overall Cognitive Status: Impaired/Different from baseline Area of Impairment: Safety/judgement Safety/Judgement: Decreased awareness of deficits, Decreased awareness of safety  Blood pressure (!) 155/64, pulse (!) 53, temperature 98.1 F (36.7 C), temperature source Oral, resp. rate 18, height 5\' 10"  (1.778 m), weight 78.1 kg, SpO2 100 %. Physical Exam  Constitutional: He is oriented to person, place, and time. He appears well-developed.  HENT:  Head: Normocephalic.  Eyes: Pupils are equal, round, and reactive to light.  Neck: Normal range of motion.  Cardiovascular: Normal rate.  Respiratory: Effort normal.  GI: Soft.  Musculoskeletal:        General: Tenderness present. No edema.  Neurological: He is alert and oriented to person, place, and time.  Patient is alert and very anxious. Follows commands. He is oriented to person place and time. RUE 3+ to 4-/5. LUE 4/5. RLE 3-4/5, LLE 4/5. Decreased sensation to LT R>L. DTR's 3++ right, 2+ left.   Psychiatric:  Impulsive, anxious    Results for orders placed or performed during the hospital encounter of 05/28/18 (from the past 24 hour(s))  CBC     Status: Abnormal   Collection Time: 05/29/18  8:20 AM  Result Value Ref Range   WBC 4.2 4.0 - 10.5 K/uL   RBC 4.59 4.22 - 5.81 MIL/uL   Hemoglobin 12.8 (L) 13.0 - 17.0 g/dL   HCT 82.5 05.3 - 97.6 %   MCV 86.3 80.0 - 100.0 fL   MCH 27.9 26.0 - 34.0 pg    MCHC 32.3 30.0 - 36.0 g/dL   RDW 73.4 19.3 - 79.0 %   Platelets 243 150 - 400 K/uL   nRBC 0.0 0.0 - 0.2 %  Basic metabolic panel     Status: Abnormal   Collection Time: 05/29/18  8:20 AM  Result Value Ref Range   Sodium 138 135 - 145 mmol/L   Potassium 4.1 3.5 - 5.1 mmol/L   Chloride 103 98 - 111 mmol/L   CO2 23 22 - 32 mmol/L   Glucose, Bld 181 (H) 70 - 99 mg/dL   BUN 9 6 - 20 mg/dL   Creatinine, Ser 2.40 0.61 - 1.24 mg/dL   Calcium 9.0 8.9 - 97.3 mg/dL   GFR calc non Af Amer >60 >60 mL/min   GFR calc Af Amer >60 >60 mL/min   Anion gap 12 5 - 15   Mr Brain W And Wo Contrast  Result Date: 05/28/2018 CLINICAL DATA:  Initial evaluation for multiple sclerosis, new neurologic event. EXAM: MRI HEAD WITHOUT AND WITH CONTRAST TECHNIQUE: Multiplanar, multiecho pulse sequences of the brain and surrounding structures were obtained without and with intravenous contrast. CONTRAST:  80 cc of Gadavist. COMPARISON:  Prior MRI from 11/12/2017 FINDINGS: Brain: Advanced cerebral and cerebellar atrophy for age, stable. Extensive T2/FLAIR hyperintensities seen involving the periventricular, deep, and subcortical white matter both cerebral hemispheres in a distribution consistent with provided history of multiple sclerosis. Patchy involvement of the brainstem and cerebellum as well. Numerous T1 hypointense black holes. Overall, appearance is grossly stable from previous without evidence for significant disease progression. No  diffusion abnormality or abnormal enhancement to suggest active demyelination. No mass lesion, midline shift or mass effect. No hydrocephalus. No extra-axial fluid collection. No evidence for acute or subacute ischemia. No other abnormal enhancement. Pituitary gland within normal limits. Midline structures intact. Vascular: Major intracranial vascular flow voids maintained. Skull and upper cervical spine: Craniocervical junction normal. Bone marrow signal intensity normal. No scalp soft tissue  abnormality. Sinuses/Orbits: Globes and orbital soft tissues within normal limits. Paranasal sinuses are largely clear. No significant mastoid effusion. Inner ear structures grossly normal. Other: None. IMPRESSION: 1. Findings consistent with advanced multiple sclerosis, relatively stable in appearance as compared to most recent MRI from 11/12/2017. No evidence for significant disease progression or active demyelination. 2. No other acute intracranial abnormality. Advanced atrophy for age. Electronically Signed   By: Rise Mu M.D.   On: 05/28/2018 20:12     Assessment/Plan: Diagnosis: MS exacerbation 1. Does the need for close, 24 hr/day medical supervision in concert with the patient's rehab needs make it unreasonable for this patient to be served in a less intensive setting? Yes 2. Co-Morbidities requiring supervision/potential complications: HTN, DM, Depression 3. Due to bladder management, bowel management, safety, skin/wound care, disease management, medication administration, pain management and patient education, does the patient require 24 hr/day rehab nursing? Yes 4. Does the patient require coordinated care of a physician, rehab nurse, PT (1-2 hrs/day, 5 days/week), OT (1-2 hrs/day, 5 days/week) and SLP (1-2 hrs/day, 5 days/week) to address physical and functional deficits in the context of the above medical diagnosis(es)? Yes Addressing deficits in the following areas: balance, endurance, locomotion, strength, transferring, bowel/bladder control, bathing, dressing, feeding, grooming, toileting, cognition and psychosocial support 5. Can the patient actively participate in an intensive therapy program of at least 3 hrs of therapy per day at least 5 days per week? Yes 6. The potential for patient to make measurable gains while on inpatient rehab is good 7. Anticipated functional outcomes upon discharge from inpatient rehab are modified independent  with PT, modified independent with  OT, modified independent with SLP. 8. Estimated rehab length of stay to reach the above functional goals is: 7-10 days 9. Anticipated D/C setting: Home 10. Anticipated post D/C treatments: HH therapy and Outpatient therapy 11. Overall Rehab/Functional Prognosis: good  RECOMMENDATIONS: This patient's condition is appropriate for continued rehabilitative care in the following setting: CIR Patient has agreed to participate in recommended program. Yes Note that insurance prior authorization may be required for reimbursement for recommended care.  Comment: Spent extensive time discussing goals of rehab and expectations moving forward from a safety and mobility standpoint. He wants to maintain his independence and mobility but hasn't always been able to demonstrate good insight and awareness. This same problem carries over to his MS treatment regarding maintaining his follow up. He is very anxious and somewhat suspicious of his prior medical care, but some of that is born out of the limitations in his insight. Rehab Admissions Coordinator to follow up.  Thanks,  Ranelle Oyster, MD, [place] [facility]  I have personally performed a face to face diagnostic evaluation of this patient. Additionally, I have reviewed and concur with the physician assistant's documentation above.    Mcarthur Rossetti Angiulli, PA-C 05/30/2018

## 2018-05-30 NOTE — Progress Notes (Signed)
Physical Therapy Treatment Patient Details Name: [name] MRN: [id number] DOB: 1980-08-16 Today's Date: 05/30/2018    History of Present Illness Pt is a 38 y/o male presenting with worsening R sided weakness. Patient was admitted for IV steroids for MS exacerbation.  Also found to be positive for influenza. PMH including but not limited to MS, DM and HTN.    PT Comments    Pt seated edge of bed.  Pt appears depressed and expressed concerns regarding the disease process of his MS with relapsing and remitting symptoms.  Pt mobilize with moderate assistance and multiple LOB.  He reports pain with active flexion of R shoulder.  Pt refusing to use RW but did agree to unilateral cructh use.  Pt continues to benefit from aggressive CIR therapies.     Follow Up Recommendations  CIR     Equipment Recommendations  None recommended by PT    Recommendations for Other Services Rehab consult     Precautions / Restrictions Precautions Precautions: Fall Restrictions Weight Bearing Restrictions: No    Mobility  Bed Mobility               General bed mobility comments: Pt seated edge of bed on arrival  Transfers Overall transfer level: Needs assistance Equipment used: None;Crutches(crutch on L side only) Transfers: Sit to/from Stand Sit to Stand: Min assist         General transfer comment: assist for stability with transition into standing from EOB  Ambulation/Gait Ambulation/Gait assistance: Mod assist Gait Distance (Feet): 100 Feet Assistive device: Crutches(Crutch on L side.  ) Gait Pattern/deviations: Step-through pattern;Decreased step length - left;Decreased step length - right;Decreased stride length;Wide base of support Gait velocity: decreased   General Gait Details: Pt with Multiple LOB and R circumduction.  Pt required assistance to maintain balance.  He did require HHA on R and use of crutch in the L side.  Pt refusing to use RW for support.     Stairs              Wheelchair Mobility    Modified Rankin (Stroke Patients Only)       Balance Overall balance assessment: Needs assistance   Sitting balance-Leahy Scale: Good       Standing balance-Leahy Scale: Poor                              Cognition Arousal/Alertness: Awake/alert Behavior During Therapy: WFL for tasks assessed/performed Overall Cognitive Status: Impaired/Different from baseline Area of Impairment: Safety/judgement                         Safety/Judgement: Decreased awareness of deficits;Decreased awareness of safety            Exercises General Exercises - Lower Extremity Hip Flexion/Marching: AROM;Right;10 reps;Supine    General Comments        Pertinent Vitals/Pain Pain Assessment: No/denies pain    Home Living                      Prior Function            PT Goals (current goals can now be found in the care plan section) Acute Rehab PT Goals Patient Stated Goal: to get stronger Potential to Achieve Goals: Good Progress towards PT goals: Progressing toward goals    Frequency    Min 3X/week      PT  Plan Current plan remains appropriate    Co-evaluation              AM-PAC PT "6 Clicks" Mobility   Outcome Measure  Help needed turning from your back to your side while in a flat bed without using bedrails?: None Help needed moving from lying on your back to sitting on the side of a flat bed without using bedrails?: None Help needed moving to and from a bed to a chair (including a wheelchair)?: A Lot Help needed standing up from a chair using your arms (e.g., wheelchair or bedside chair)?: A Lot Help needed to walk in hospital room?: A Lot Help needed climbing 3-5 steps with a railing? : Total 6 Click Score: 15    End of Session Equipment Utilized During Treatment: Gait belt Activity Tolerance: Patient tolerated treatment well Patient left: in bed;with call bell/phone within  reach Nurse Communication: Mobility status PT Visit Diagnosis: Other abnormalities of gait and mobility (R26.89);Muscle weakness (generalized) (M62.81)     Time: 7340-3709 PT Time Calculation (min) (ACUTE ONLY): 32 min  Charges:  $Gait Training: 8-22 mins $Self Care/Home Management: 8-22                     Joycelyn Rua, PTA Acute Rehabilitation Services Pager [phone] Office [phone]     Leor Whyte Artis Delay 05/30/2018, 5:23 PM

## 2018-05-30 NOTE — Progress Notes (Signed)
IP rehab admissions - I have called and opened the case with [facility] requesting acute inpatient rehab admission.  We are awaiting an OT consult.  I will follow up again tomorrow.  Call me for questions.  [phone]

## 2018-05-30 NOTE — Progress Notes (Signed)
Pt refused all lab draws this AM, educated on importance of lab monitoring, continues to refuse. [name]

## 2018-05-30 NOTE — Progress Notes (Addendum)
NEUROLOGY PROGRESS NOTE  Subjective: Seen and examined. He says that he would like to try his cocktail of medications which is low-dose oral steroids and salt water for 2 years before trying any other therapy. He is tried [place] and gotten 1 infusion but cannot find a doctor who will give him another infusion. He is very reluctant to see any of the local doctors as he feels that he has not received a good level of care and would like to go to [place] to find a neurologist we can work with.  Exam: Vitals:   05/30/18 0731 05/30/18 0820  BP: (!) 155/64   Pulse: (!) 53   Resp: 18   Temp: 98.1 F (36.7 C)   SpO2: 100% 100%    Physical Exam   HEENT-  Normocephalic, no lesions, without obvious abnormality.  Normal external eye and conjunctiva.  Extremities- Warm, dry and intact Musculoskeletal-no joint tenderness, deformity or swelling Skin-warm and dry, no hyperpigmentation, vitiligo, or suspicious lesions Neuro:  Mental Status: Alert, oriented, thought very tangential, anxious, pressured, and has a frustrated tone.  Speech dysarthric with multiple profanities without evidence of aphasia.  Able to follow 3 step commands without difficulty. Cranial Nerves: II:  Visual fields grossly normal,  III,IV, VI: ptosis not present, extra-ocular motions intact bilaterally pupils equal, round, reactive to light and accommodation V,VII: smile symmetric, facial light touch sensation normal bilaterally VIII: hearing normal bilaterally IX,X: uvula rises midline XI: bilateral shoulder shrug XII: midline tongue extension Motor: Right : Upper extremity   5/5    Left:     Upper extremity   5/5  Lower extremity   3/5     Lower extremity   5/5 -give way weakness with the upper and lower extremity on the right. - At one point when distracted had positive Hoover's when lifting his right leg Tone and bulk:normal tone throughout; no atrophy noted Sensory: Pinprick and light touch intact throughout,  bilaterally Deep Tendon Reflexes: 2+ and symmetric throughout with brisk Achilles Plantars: Right: downgoing   Left: downgoing Cerebellar: normal finger-to-nose,   Medications:  Prior to Admission:  Medications Prior to Admission  Medication Sig Dispense Refill Last Dose  . cyclobenzaprine (FLEXERIL) 10 MG tablet Take 1 tablet (10 mg total) by mouth 3 (three) times daily as needed for muscle spasms. (Patient not taking: Reported on 05/28/2018) 20 tablet 0 Not Taking at Unknown time   Scheduled: . baclofen  5 mg Oral BID  . ipratropium-albuterol  3 mL Nebulization Q6H  . oseltamivir  75 mg Oral BID  . pantoprazole  40 mg Oral Daily  . sodium chloride flush  3 mL Intravenous Q12H   Continuous: . sodium chloride      Pertinent Labs/Diagnostics:   [name] Contrast  Result Date: 05/28/2018 CLINICAL DATA:  Initial evaluation for multiple sclerosis, new neurologic event. EXAM: MRI HEAD WITHOUT AND WITH CONTRAST TECHNIQUE: Multiplanar, multiecho pulse sequences of the brain and surrounding structures were obtained without and with intravenous contrast. CONTRAST:  80 cc of Gadavist. COMPARISON:  Prior MRI from 11/12/2017 FINDINGS: Brain: Advanced cerebral and cerebellar atrophy for age, stable. Extensive T2/FLAIR hyperintensities seen involving the periventricular, deep, and subcortical white matter both cerebral hemispheres in a distribution consistent with provided history of multiple sclerosis. Patchy involvement of the brainstem and cerebellum as well. Numerous T1 hypointense black holes. Overall, appearance is grossly stable from previous without evidence for significant disease progression. No diffusion abnormality or abnormal enhancement to  suggest active demyelination. No mass lesion, midline shift or mass effect. No hydrocephalus. No extra-axial fluid collection. No evidence for acute or subacute ischemia. No other abnormal enhancement. Pituitary gland within normal limits.  Midline structures intact. Vascular: Major intracranial vascular flow voids maintained. Skull and upper cervical spine: Craniocervical junction normal. Bone marrow signal intensity normal. No scalp soft tissue abnormality. Sinuses/Orbits: Globes and orbital soft tissues within normal limits. Paranasal sinuses are largely clear. No significant mastoid effusion. Inner ear structures grossly normal. Other: None. IMPRESSION: 1. Findings consistent with advanced multiple sclerosis, relatively stable in appearance as compared to most recent MRI from 11/12/2017. No evidence for significant disease progression or active demyelination. 2. No other acute intracranial abnormality. Advanced atrophy for age. Electronically Signed   By: Rise Mu M.D.   On: 05/28/2018 20:12     Felicie Morn PA-C [facility] [phone]   Attending addendum Patient seen and examined. Imaging reviewed personally.  MRI does not show any evidence of enhancing lesions.  Assessment: 34 y Male with chronic relapsing remitting MS, spasticity presents for inability to walk, worsening MS symptoms.  MRI brain with and without contrast shows stable lesions.  MRI C-spine was not performed, however recently done on July 2019 showed no acute lesions.    Patient has received 3 IV doses of Solu-Medrol with no significant improvement.  Also found to be positive for influenza.  Is supposed to be currently on Tysabri infusion and follows Dr Bluford Kaufmann it appears that he has not followed up for over a year.    States that no other neurologist will accept him.  There was a plan for starting [place] and  looking at care everywhere notes patient has seen Dr. Malvin Johns back in 2015 but has not had a follow-up.  Patient appears reluctant to follow-up with any of the local neurologist tremor specialist and would like to continue oxygen for the follow-up.  I do not think that treating him with steroids anymore would be beneficial  unless he has evidence of enhancing lesions.  He does need to be in a long-term disease modifying therapy and I would encourage him to see whichever neurologist he thinks might be able to come up with a plan which works best for him.   It is also worth noting the patient appeared to be very suspicious and dismissive of the neurological consultations and recommendations that have been provided to him till date.  Impression: As for the current presentation, I think this is pseudo-exacerbation in the setting of influenza.   Recommendations: - No further steroid treatment at this time - Referred to the neurologist was charged for outpatient care. -Management of influenza per primary team as you are Neurology will sign off at this time.    05/30/2018, 11:42 AM  -- Milon Dikes, MD [facility] Pager: [phone] If 7pm to 7am, please call on call as listed on AMION.

## 2018-05-31 ENCOUNTER — Inpatient Hospital Stay (HOSPITAL_COMMUNITY)
Admission: RE | Admit: 2018-05-31 | Discharge: 2018-06-07 | DRG: 945 | Disposition: A | Discharge status: Home Health | Payer: Medicare HMO | Source: Intra-hospital | Attending: Physical Medicine & Rehabilitation | Admitting: Physical Medicine & Rehabilitation

## 2018-05-31 DIAGNOSIS — Z8709 Personal history of other diseases of the respiratory system: Secondary | ICD-10-CM | POA: Diagnosis not present

## 2018-05-31 DIAGNOSIS — R270 Ataxia, unspecified: Secondary | ICD-10-CM | POA: Diagnosis not present

## 2018-05-31 DIAGNOSIS — Z8249 Family history of ischemic heart disease and other diseases of the circulatory system: Secondary | ICD-10-CM

## 2018-05-31 DIAGNOSIS — R4189 Other symptoms and signs involving cognitive functions and awareness: Secondary | ICD-10-CM

## 2018-05-31 DIAGNOSIS — R001 Bradycardia, unspecified: Secondary | ICD-10-CM | POA: Diagnosis present

## 2018-05-31 DIAGNOSIS — E1165 Type 2 diabetes mellitus with hyperglycemia: Secondary | ICD-10-CM | POA: Diagnosis present

## 2018-05-31 DIAGNOSIS — R4689 Other symptoms and signs involving appearance and behavior: Secondary | ICD-10-CM

## 2018-05-31 DIAGNOSIS — Z820 Family history of epilepsy and other diseases of the nervous system: Secondary | ICD-10-CM

## 2018-05-31 DIAGNOSIS — T380X5A Adverse effect of glucocorticoids and synthetic analogues, initial encounter: Secondary | ICD-10-CM | POA: Diagnosis not present

## 2018-05-31 DIAGNOSIS — G35 Multiple sclerosis: Secondary | ICD-10-CM | POA: Diagnosis not present

## 2018-05-31 DIAGNOSIS — G8191 Hemiplegia, unspecified affecting right dominant side: Secondary | ICD-10-CM | POA: Diagnosis not present

## 2018-05-31 DIAGNOSIS — F17210 Nicotine dependence, cigarettes, uncomplicated: Secondary | ICD-10-CM | POA: Diagnosis present

## 2018-05-31 DIAGNOSIS — R5381 Other malaise: Secondary | ICD-10-CM | POA: Diagnosis not present

## 2018-05-31 DIAGNOSIS — R269 Unspecified abnormalities of gait and mobility: Secondary | ICD-10-CM | POA: Diagnosis not present

## 2018-05-31 DIAGNOSIS — I10 Essential (primary) hypertension: Secondary | ICD-10-CM | POA: Diagnosis present

## 2018-05-31 DIAGNOSIS — R531 Weakness: Secondary | ICD-10-CM

## 2018-05-31 DIAGNOSIS — R69 Illness, unspecified: Secondary | ICD-10-CM | POA: Diagnosis not present

## 2018-05-31 LAB — BASIC METABOLIC PANEL
Anion gap: 11 (ref 5–15)
BUN: 13 mg/dL (ref 6–20)
CO2: 28 mmol/L (ref 22–32)
Calcium: 9.3 mg/dL (ref 8.9–10.3)
Chloride: 102 mmol/L (ref 98–111)
Creatinine, Ser: 0.94 mg/dL (ref 0.61–1.24)
GFR calc Af Amer: >60 mL/min (ref >60)
GFR calc non Af Amer: >60 mL/min (ref >60)
Glucose, Bld: 110 mg/dL — ABNORMAL HIGH (ref 70–99)
Potassium: 4.1 mmol/L (ref 3.5–5.1)
Sodium: 141 mmol/L (ref 135–145)

## 2018-05-31 LAB — CBC
HEMATOCRIT: 38.3 % — AB (ref 39.0–52.0)
Hemoglobin: 12.5 g/dL — ABNORMAL LOW (ref 13.0–17.0)
MCH: 28.1 pg (ref 26.0–34.0)
MCHC: 32.6 g/dL (ref 30.0–36.0)
MCV: 86.1 fL (ref 80.0–100.0)
Platelets: 241 10*3/uL (ref 150–400)
RBC: 4.45 MIL/uL (ref 4.22–5.81)
RDW: 13.4 % (ref 11.5–15.5)
WBC: 13.5 10*3/uL — ABNORMAL HIGH (ref 4.0–10.5)
nRBC: 0 % (ref 0.0–0.2)

## 2018-05-31 LAB — MAGNESIUM: Magnesium: 1.9 mg/dL (ref 1.7–2.4)

## 2018-05-31 MED ORDER — PROCHLORPERAZINE 25 MG RE SUPP: 12.5000 mg | Freq: Four times a day (QID) | RECTAL | Status: DC | PRN

## 2018-05-31 MED ORDER — BACLOFEN 5 MG PO TABS: 5.0000 mg | ORAL_TABLET | Freq: Two times a day (BID) | ORAL | 0 refills | Status: DC

## 2018-05-31 MED ORDER — DIPHENHYDRAMINE HCL 12.5 MG/5ML PO ELIX: 12.5000 mg | ORAL_SOLUTION | Freq: Four times a day (QID) | ORAL | Status: DC | PRN

## 2018-05-31 MED ORDER — PROCHLORPERAZINE EDISYLATE 10 MG/2ML IJ SOLN: 5.0000 mg | Freq: Four times a day (QID) | INTRAMUSCULAR | Status: DC | PRN

## 2018-05-31 MED ORDER — IBUPROFEN 400 MG PO TABS
600.0000 mg | ORAL_TABLET | Freq: Four times a day (QID) | ORAL | Status: AC | PRN
  Administered 2018-06-03: 600 mg via ORAL
  Filled 2018-05-31: qty 2

## 2018-05-31 MED ORDER — TRAZODONE HCL 50 MG PO TABS
25.0000 mg | ORAL_TABLET | Freq: Every evening | ORAL | Status: DC | PRN
  Filled 2018-05-31: qty 1

## 2018-05-31 MED ORDER — POLYETHYLENE GLYCOL 3350 17 G PO PACK: 17.0000 g | PACK | Freq: Every day | ORAL | Status: DC | PRN

## 2018-05-31 MED ORDER — IPRATROPIUM-ALBUTEROL 0.5-2.5 (3) MG/3ML IN SOLN: 3.0000 mL | Freq: Four times a day (QID) | RESPIRATORY_TRACT | Status: DC | PRN

## 2018-05-31 MED ORDER — IPRATROPIUM-ALBUTEROL 0.5-2.5 (3) MG/3ML IN SOLN
3.0000 mL | Freq: Two times a day (BID) | RESPIRATORY_TRACT | Status: DC
  Administered 2018-05-31: 3 mL via RESPIRATORY_TRACT
  Filled 2018-05-31: qty 3

## 2018-05-31 MED ORDER — PANTOPRAZOLE SODIUM 40 MG PO TBEC
40.0000 mg | DELAYED_RELEASE_TABLET | Freq: Every day | ORAL | Status: DC
  Administered 2018-06-01 – 2018-06-07 (×7): 40 mg via ORAL
  Filled 2018-05-31 (×7): qty 1

## 2018-05-31 MED ORDER — ALUM & MAG HYDROXIDE-SIMETH 200-200-20 MG/5ML PO SUSP: 30.0000 mL | ORAL | Status: DC | PRN

## 2018-05-31 MED ORDER — PROCHLORPERAZINE MALEATE 5 MG PO TABS: 5.0000 mg | ORAL_TABLET | Freq: Four times a day (QID) | ORAL | Status: DC | PRN

## 2018-05-31 MED ORDER — FLEET ENEMA 7-19 GM/118ML RE ENEM: 1.0000 | ENEMA | Freq: Once | RECTAL | Status: DC | PRN

## 2018-05-31 MED ORDER — OSELTAMIVIR PHOSPHATE 75 MG PO CAPS
75.0000 mg | ORAL_CAPSULE | Freq: Two times a day (BID) | ORAL | Status: AC
  Administered 2018-05-31 – 2018-06-02 (×5): 75 mg via ORAL
  Filled 2018-05-31 (×6): qty 1

## 2018-05-31 MED ORDER — GUAIFENESIN 100 MG/5ML PO SOLN: 10.0000 mL | ORAL | Status: DC | PRN

## 2018-05-31 MED ORDER — ACETAMINOPHEN 325 MG PO TABS
325.0000 mg | ORAL_TABLET | ORAL | Status: DC | PRN
  Filled 2018-05-31: qty 2

## 2018-05-31 MED ORDER — OSELTAMIVIR PHOSPHATE 75 MG PO CAPS: 75.0000 mg | ORAL_CAPSULE | Freq: Two times a day (BID) | ORAL | 0 refills | Status: DC

## 2018-05-31 MED ORDER — BISACODYL 10 MG RE SUPP: 10.0000 mg | Freq: Every day | RECTAL | Status: DC | PRN

## 2018-05-31 MED ORDER — BACLOFEN 5 MG HALF TABLET
5.0000 mg | ORAL_TABLET | Freq: Two times a day (BID) | ORAL | Status: DC
  Administered 2018-05-31 – 2018-06-07 (×14): 5 mg via ORAL
  Filled 2018-05-31 (×14): qty 1

## 2018-05-31 MED ORDER — ENOXAPARIN SODIUM 40 MG/0.4ML ~~LOC~~ SOLN
40.0000 mg | SUBCUTANEOUS | Status: DC
  Filled 2018-05-31 (×5): qty 0.4

## 2018-05-31 MED ORDER — GUAIFENESIN-DM 100-10 MG/5ML PO SYRP: 5.0000 mL | ORAL_SOLUTION | Freq: Four times a day (QID) | ORAL | Status: DC | PRN

## 2018-05-31 NOTE — Evaluation (Signed)
Occupational Therapy Evaluation Patient Details Name: [name] MRN: [id number] DOB: 12-Apr-1981 Today's Date: 05/31/2018    History of Present Illness Pt is a 38 y/o male presenting with worsening R sided weakness. Patient was admitted for IV steroids for MS exacerbation.  Also found to be positive for influenza. PMH including but not limited to MS, DM and HTN.   Clinical Impression   Pt presents to OT with the above diagnosis, and demonstrates the below listed deficits.  He currently requires min A for UB ADLs and mod A for LB ADLs.  He demonstrates decreased safety awareness, and impaired memory as well well impaired attention (unsure of his baseline).  He lives with his mom and was mod I with ADLs PTA.  Recommend CIR.  All further needs can be addressed by CIR, acute OT will sign off.     Follow Up Recommendations  CIR    Equipment Recommendations  None recommended by OT    Recommendations for Other Services       Precautions / Restrictions Precautions Precautions: Fall      Mobility Bed Mobility Overal bed mobility: Needs Assistance Bed Mobility: Supine to Sit     Supine to sit: Supervision        Transfers Overall transfer level: Needs assistance Equipment used: None Transfers: Sit to/from Stand;Stand Pivot Transfers Sit to Stand: Min assist Stand pivot transfers: Min assist       General transfer comment: assist for balance     Balance Overall balance assessment: Needs assistance Sitting-balance support: Feet supported Sitting balance-Leahy Scale: Good Sitting balance - Comments: able to don/doff socks    Standing balance support: During functional activity;No upper extremity supported Standing balance-Leahy Scale: Poor Standing balance comment: Pt requires min - mod A for balance                            ADL either performed or assessed with clinical judgement   ADL Overall ADL's : Needs assistance/impaired Eating/Feeding: Modified  independent;Sitting   Grooming: Wash/dry hands;Wash/dry face;Oral care;Brushing hair;Moderate assistance;Standing   Upper Body Bathing: Set up;Sitting   Lower Body Bathing: Moderate assistance;Sit to/from stand   Upper Body Dressing : Set up;Supervision/safety;Sitting   Lower Body Dressing: Moderate assistance;Sit to/from stand   Toilet Transfer: Moderate assistance;Stand-pivot;BSC   Toileting- Clothing Manipulation and Hygiene: Moderate assistance;Sit to/from stand       Functional mobility during ADLs: Moderate assistance General ADL Comments: assist for balance      Vision Baseline Vision/History: Wears glasses Patient Visual Report: Blurring of vision Additional Comments: Pt reports he has lost his glasses multiple times.  He reports blurriness of vision, but unable to clarify if vision is the same or worse as he frequently self distracts      Perception     Praxis      Pertinent Vitals/Pain Pain Assessment: No/denies pain     Hand Dominance Right   Extremity/Trunk Assessment Upper Extremity Assessment Upper Extremity Assessment: RUE deficits/detail;LUE deficits/detail RUE Deficits / Details: 4-/5 - 4/5; dysmetria noted  RUE Coordination: decreased gross motor;decreased fine motor LUE Deficits / Details: mild dysmetria  LUE Coordination: decreased fine motor;decreased gross motor   Lower Extremity Assessment Lower Extremity Assessment: Defer to PT evaluation       Communication Communication Communication: No difficulties   Cognition Arousal/Alertness: Awake/alert Behavior During Therapy: WFL for tasks assessed/performed;Impulsive Overall Cognitive Status: Impaired/Different from baseline Area of Impairment: Attention;Memory;Safety/judgement  Current Attention Level: Selective Memory: Decreased short-term memory   Safety/Judgement: Decreased awareness of safety     General Comments: Pt is impulsive.  He has no recollection  of ambulation with PT earlier.  unsure of his baseline as he infers that he has had memory deficits    General Comments       Exercises     Shoulder Instructions      Home Living Family/patient expects to be discharged to:: Inpatient rehab Living Arrangements: Parent Available Help at Discharge: Friend(s);Family;Available 24 hours/day Type of Home: House Home Access: Stairs to enter [employer] of Steps: 3 Entrance Stairs-Rails: Right;Left;Can reach both Home Layout: One level     Bathroom Shower/Tub: Tub/shower unit         Home Equipment: Tub bench   Additional Comments: Pt states his mother will assist       Prior Functioning/Environment Level of Independence: Independent        Comments: Pt reports he is independent with ADLs, and driving.  recent falls.  He voices he frequently has fluctuations in function due to MS        OT Problem List: Decreased strength;Decreased activity tolerance;Impaired balance (sitting and/or standing);Decreased cognition;Decreased coordination;Decreased safety awareness;Impaired UE functional use      OT Treatment/Interventions:      OT Goals(Current goals can be found in the care plan section) Acute Rehab OT Goals Patient Stated Goal: to go to rehab and get stronger  OT Goal Formulation: All assessment and education complete, DC therapy  OT Frequency:     Barriers to D/C:            Co-evaluation              AM-PAC OT "6 Clicks" Daily Activity     Outcome Measure Help from another person eating meals?: None Help from another person taking care of personal grooming?: A Little Help from another person toileting, which includes using toliet, bedpan, or urinal?: A Lot Help from another person bathing (including washing, rinsing, drying)?: A Lot Help from another person to put on and taking off regular upper body clothing?: A Little Help from another person to put on and taking off regular lower body  clothing?: A Lot 6 Click Score: 16   End of Session Equipment Utilized During Treatment: Gait belt Nurse Communication: Mobility status  Activity Tolerance: Patient tolerated treatment well Patient left: in chair;with call bell/phone within reach;with bed alarm set  OT Visit Diagnosis: Unsteadiness on feet (R26.81)                Time: 0321-2248 OT Time Calculation (min): 44 min Charges:  OT General Charges $OT Visit: 1 Visit OT Evaluation $OT Eval Moderate Complexity: 1 Mod OT Treatments $Neuromuscular Re-education: 23-37 mins  Jeani Hawking, OTR/L Acute Rehabilitation Services Pager [phone] Office [phone]   Jeani Hawking M 05/31/2018, 5:08 PM

## 2018-05-31 NOTE — Progress Notes (Addendum)
At 1648 report given to RN Tonia on CIR/Rehab [facility]. Waiting for medical MD to finalize discharge and place order to d/c pt to Rehab.   At 1717 MD has placed order to discharge pt to Rehab, Dorisann Frames, RN on [address] notified and will move pt shortly to [phone].

## 2018-05-31 NOTE — Discharge Summary (Signed)
Physician Discharge Summary  Val EagleKeith S Swenor ZOX:[id number] DOB: [birth year] DOA: 05/28/2018  PCP: Lonie Peakonroy, Nathan, PA-C  Admit date: 05/28/2018 Discharge date: 05/31/2018  Time spent: 40 minutes  Recommendations for Outpatient Follow-up:  1. Follow up outpatient CBC/CMP 2. Follow up with neurology as an outpatient for disease modifying therapy. 3. Complete course of tamiflu    Discharge Diagnoses:  Principal Problem:   Multiple sclerosis exacerbation (HCC) Active Problems:   Hypertension   Discharge Condition: stable  Diet recommendation: heart healthy  Filed Weights   05/28/18 2300  Weight: 78.1 kg    History of present illness:  [name] 38 y.o.malewith medical history significant ofmultiple sclerosis, hypertension comes in with 2 days of progressive worsening right-sided weakness with right facial drooping that is been causing him to fall several times. Patient denies any fevers. He reports he has been having some vision changes. He has [name] longstanding history of multiple sclerosis but it is never been this bad before. Last time he was hospitalized for MS flare was over [place] year ago. Patient found to have multiple sclerosis flare and neurological deficits. Neurology has been called recommending IV Solu-Medrol and hospitalization.  He was admitted for influenza and an MS flare.  Neurology was c/s and recommended 3 days of solumedrol.  He improved on day of discharge and was discharged to CIR.  Follow up with outpatient providers encouraged.  Hospital Course:  MS Exacerbation: Appreciate neuro recs - recommending outpatient neurology follow up Maybe some mild improvement from presentation, c/o some blurry vision today (discussed with neurology who recommended outpatient neurology follow up) - discussed also follow up with ophtho as he notes he thinks he needs glasses. Pt has some distrust in general when it comes to his care for his MS.  Both I and neurology have  encouraged him to follow up with neurology as an outpatient for disease modifying therapy.  Continue to encourage.  S/p 1 gram solumedrol daily x 3 days To CIR today  Influenza Melana Hingle Continue tamiflu  Hypertension Stable, continue to monitor off meds  Procedures:  none   Consultations:  neurology  Discharge Exam: Vitals:   05/31/18 1201 05/31/18 1600  BP: 130/79 (!) 146/98  Pulse: (!) 58 (!) 56  Resp: 18 16  Temp: 98.9 F (37.2 C) 99 F (37.2 C)  SpO2: 95% 100%   Notes he may have noticed some minimal improvement since presentation, not much Noted some blurry vision this AM.  On further discussion, notes this comes and goes with sx.  Thinks he needs glasses.  General: No acute distress. Cardiovascular: Heart sounds show Shawnita Krizek regular rate, and rhythm Lungs: Clear to auscultation bilaterally  Abdomen: Soft, nontender, nondistended  Neurological: Alert and oriented 3. R sided weakness. Cranial nerves II through XII grossly intact.  Visual fields intact.  Skin: Warm and dry. No rashes or lesions. Extremities: No clubbing or cyanosis. No edema.  Psychiatric: Mood and affect are normal. Insight and judgment are appropriate.  Discharge Instructions   Discharge Instructions    Call MD for:  difficulty breathing, headache or visual disturbances   Complete by:  As directed    Call MD for:  extreme fatigue   Complete by:  As directed    Call MD for:  hives   Complete by:  As directed    Call MD for:  persistant dizziness or light-headedness   Complete by:  As directed    Call MD for:  persistant nausea and vomiting  Complete by:  As directed    Call MD for:  redness, tenderness, or signs of infection (pain, swelling, redness, odor or green/yellow discharge around incision site)   Complete by:  As directed    Call MD for:  severe uncontrolled pain   Complete by:  As directed    Call MD for:  temperature >100.4   Complete by:  As directed    Diet - low sodium heart  healthy   Complete by:  As directed    Discharge instructions   Complete by:  As directed    You were seen for the flu and an exacerbation of your MS.  We treated you with tamiflu and steroids.  Your course of steroids is complete.  You'll discharge with [name] few more days of tamiflu.  It's extremely important that you follow up with neurology as an outpatient for continued care of your MS.  Please follow up with your provider as an outpatient.  Return for new, recurrent, or worsening symptoms.  Please ask your PCP to request records from this hospitalization so they know what was done and what the next steps will be.   Increase activity slowly   Complete by:  As directed      Allergies as of 05/31/2018   No Known Allergies     Medication List    STOP taking these medications   cyclobenzaprine 10 MG tablet Commonly known as:  FLEXERIL     TAKE these medications   Baclofen 5 MG Tabs Take 5 mg by mouth 2 (two) times daily for 30 days.   oseltamivir 75 MG capsule Commonly known as:  TAMIFLU Take 1 capsule (75 mg total) by mouth 2 (two) times daily for 5 doses.      No Known Allergies    The results of significant diagnostics from this hospitalization (including imaging, microbiology, ancillary and laboratory) are listed below for reference.    Significant Diagnostic Studies: [name] Contrast  Result Date: 05/28/2018 CLINICAL DATA:  Initial evaluation for multiple sclerosis, new neurologic event. EXAM: MRI HEAD WITHOUT AND WITH CONTRAST TECHNIQUE: Multiplanar, multiecho pulse sequences of the brain and surrounding structures were obtained without and with intravenous contrast. CONTRAST:  80 cc of Gadavist. COMPARISON:  Prior MRI from 11/12/2017 FINDINGS: Brain: Advanced cerebral and cerebellar atrophy for age, stable. Extensive T2/FLAIR hyperintensities seen involving the periventricular, deep, and subcortical white matter both cerebral hemispheres in [name] distribution  consistent with provided history of multiple sclerosis. Patchy involvement of the brainstem and cerebellum as well. Numerous T1 hypointense black holes. Overall, appearance is grossly stable from previous without evidence for significant disease progression. No diffusion abnormality or abnormal enhancement to suggest active demyelination. No mass lesion, midline shift or mass effect. No hydrocephalus. No extra-axial fluid collection. No evidence for acute or subacute ischemia. No other abnormal enhancement. Pituitary gland within normal limits. Midline structures intact. Vascular: Major intracranial vascular flow voids maintained. Skull and upper cervical spine: Craniocervical junction normal. Bone marrow signal intensity normal. No scalp soft tissue abnormality. Sinuses/Orbits: Globes and orbital soft tissues within normal limits. Paranasal sinuses are largely clear. No significant mastoid effusion. Inner ear structures grossly normal. Other: None. IMPRESSION: 1. Findings consistent with advanced multiple sclerosis, relatively stable in appearance as compared to most recent MRI from 11/12/2017. No evidence for significant disease progression or active demyelination. 2. No other acute intracranial abnormality. Advanced atrophy for age. Electronically Signed   By: Rise Mu M.D.   On:  05/28/2018 20:12    Microbiology: Recent Results (from the past 240 hour(s))  Culture, blood (routine x 2)     Status: None (Preliminary result)   Collection Time: 05/29/18 12:15 AM  Result Value Ref Range Status   Specimen Description BLOOD LEFT ARM  Final   Special Requests   Final    BOTTLES DRAWN AEROBIC AND ANAEROBIC Blood Culture adequate volume   Culture   Final    NO GROWTH 2 DAYS Performed at [facility], [address] [place], [place] [address]    Report Status PENDING  Incomplete  Culture, blood (routine x 2)     Status: None (Preliminary result)   Collection Time: 05/29/18 12:20 AM   Result Value Ref Range Status   Specimen Description BLOOD RIGHT ARM  Final   Special Requests   Final    BOTTLES DRAWN AEROBIC AND ANAEROBIC Blood Culture adequate volume   Culture   Final    NO GROWTH 2 DAYS Performed at [facility], [address], [place], [place] [address]    Report Status PENDING  Incomplete     Labs: Basic Metabolic Panel: Recent Labs  Lab 05/28/18 1724 05/29/18 0820 05/30/18 0716 05/31/18 0639  NA 140 138 140 141  K 4.0 4.1 4.1 4.1  CL 107 103 104 102  CO2 24 23 25 28   GLUCOSE 87 181* 165* 110*  BUN 8 9 12 13   CREATININE 0.93 0.99 0.96 0.94  CALCIUM 9.2 9.0 9.0 9.3  MG  --   --  1.8 1.9   Liver Function Tests: Recent Labs  Lab 05/30/18 0716  AST 30  ALT 18  ALKPHOS 44  BILITOT 0.4  PROT 6.9  ALBUMIN 3.4*   No results for input(s): LIPASE, AMYLASE in the last 168 hours. No results for input(s): AMMONIA in the last 168 hours. CBC: Recent Labs  Lab 05/28/18 1724 05/29/18 0820 05/30/18 0716 05/31/18 0639  WBC 5.6 4.2 10.2 13.5*  NEUTROABS 4.7  --   --   --   HGB 12.3* 12.8* 12.8* 12.5*  HCT 38.9* 39.6 39.9 38.3*  MCV 87.2 86.3 85.8 86.1  PLT 237 243 236 241   Cardiac Enzymes: Recent Labs  Lab 05/29/18 0015  TROPONINI <0.03   BNP: BNP (last 3 results) No results for input(s): BNP in the last 8760 hours.  ProBNP (last 3 results) No results for input(s): PROBNP in the last 8760 hours.  CBG: No results for input(s): GLUCAP in the last 168 hours.     Signed:  Lacretia Nicks MD.  [facility] 05/31/2018, 5:10 PM

## 2018-05-31 NOTE — PMR Pre-admission (Signed)
PMR Admission Coordinator Pre-Admission Assessment  Patient: [name] is an 38 y.o., male MRN: [id number] DOB: Jul 02, 1980 Height: 5\' 10"  (177.8 cm) Weight: 78.1 kg              Insurance Information HMO:      PPO: yES     PCP:       IPA:       80/20:       OTHER:   PRIMARY: [employer] Medicare      Policy#: [id number]      Subscriber: patient CM Name: Billey GoslingCharlie      Phone#: [phone]     Fax#: [phone] Pre-Cert#: [id number] from 05/31/18 to 06/06/18 with update due on 06/06/18      Employer: [employer] Benefits:  Phone #: [phone]     Name: Online Eff. Date: 05/01/18     Deduct: $0      Out of Pocket Max: $4200      Life Max: N/A CIR: $250 days 1-6 max $1500 per admission      SNF: $0 days 1-20; $178 days 21-100 Outpatient: medical necessity     Co-Pay: $35/visit Home Health: 100%      Co-Pay: none DME: 80%     Co-Pay: 20% Providers: in network  Medicaid Application Date:      Case Manager:   Disability Application Date:       Case Worker:   Emergency Conservator, museum/galleryContact Information Contact Information    Name Relation Home Work Mobile   StocktonSherrill,Katherine Spouse [phone]  [phone]   Lyla SonMason, Audrey Daughter   [phone]     Current Medical History  Patient Admitting Diagnosis:  MS exacerbation; Influenza  History of Present Illness: A 37 y.o.right handed male with history of hypertension, diabetes mellitus, multiple sclerosis diagnosed 2010 maintained on Tysabri and  followed by neurology services Dr.Yan with poor compliance and most recent admission for MS exacerbation and received inpatient rehabilitation services 04/17/2017 04/25/2017. Per chart review patient lives with his mother. Reportedly independent prior to admission. One level home with 3 steps to entry. Mother does receive hemodialysis. Presented 05/28/2018 with increasing right sided weakness and multiple falls. MRI of the brain findings consistent with advanced multiple sclerosis relatively stable in appearance as  compared to most recent MRI from 11/12/2017. No evidence for significant disease progression or active demyelination. Neurology consulted placed on intravenous Solu-Medrol for MS exacerbation. Influenza panel positive and placed on Tamiflu. Therapy evaluations completed with recommendations of physical medicine rehabilitation consult.    Complete NIHSS TOTAL: 3  Past Medical History  Past Medical History:  Diagnosis Date  . Depression   . Diabetes mellitus without complication (HCC)   . Eye abnormalities    unspecified by pt  . Hypertension   . Multiple sclerosis (HCC)   . Weakness    generalized    Family History  family history includes Heart disease in his mother; Multiple sclerosis in his cousin; Other in his father. He was adopted.  Prior Rehab/Hospitalizations: Has been to CIR previously and was getting HH therapies in the past year.  Has the patient had major surgery during 100 days prior to admission? No  Current Medications   Current Facility-Administered Medications:  .  0.9 %  sodium chloride infusion, 250 mL, Intravenous, PRN, Tarry Kosavid, Rachal A, MD .  acetaminophen (TYLENOL) tablet 650 mg, 650 mg, Oral, Q6H PRN, Opyd, Lavone Neriimothy S, MD, 650 mg at 05/29/18 0034 .  baclofen (LIORESAL) tablet 5 mg, 5 mg, Oral, BID, Aroor, Sushanth R,  MD, 5 mg at 05/31/18 0928 .  guaiFENesin (ROBITUSSIN) 100 MG/5ML solution 200 mg, 10 mL, Oral, Q4H PRN, Zigmund Daniel., MD, 200 mg at 05/31/18 0600 .  ibuprofen (ADVIL,MOTRIN) tablet 600 mg, 600 mg, Oral, Q6H PRN, Opyd, Lavone Neri, MD, 600 mg at 05/29/18 0251 .  ipratropium-albuterol (DUONEB) 0.5-2.5 (3) MG/3ML nebulizer solution 3 mL, 3 mL, Nebulization, Q6H PRN, Zigmund Daniel., MD .  oseltamivir (TAMIFLU) capsule 75 mg, 75 mg, Oral, BID, Haydee Monica, MD, 75 mg at 05/31/18 9432 .  pantoprazole (PROTONIX) EC tablet 40 mg, 40 mg, Oral, Daily, Aroor, Dara Lords, MD, 40 mg at 05/31/18 0929 .  sodium chloride flush (NS) 0.9 % injection  3 mL, 3 mL, Intravenous, Q12H, Tarry Kos A, MD, 3 mL at 05/31/18 0933 .  sodium chloride flush (NS) 0.9 % injection 3 mL, 3 mL, Intravenous, PRN, Haydee Monica, MD  Patients Current Diet:  Diet Order            Diet Heart Room service appropriate? Yes; Fluid consistency: Thin  Diet effective now              Precautions / Restrictions Precautions Precautions: Fall Restrictions Weight Bearing Restrictions: No   Has the patient had 2 or more falls or a fall with injury in the past year?Yes.  Patient reports multiple falls this past year.  Prior Activity Level Community (5-7x/wk): Went out at least 4 X a week.  Home Assistive Devices / Equipment Home Assistive Devices/Equipment: None Home Equipment: None  Prior Device Use: Indicate devices/aids used by the patient prior to current illness, exacerbation or injury? None  Prior Functional Level Prior Function Level of Independence: Independent  Self Care: Did the patient need help bathing, dressing, using the toilet or eating?  Independent  Indoor Mobility: Did the patient need assistance with walking from room to room (with or without device)? Independent  Stairs: Did the patient need assistance with internal or external stairs (with or without device)? Independent  Functional Cognition: Did the patient need help planning regular tasks such as shopping or remembering to take medications? Independent  Current Functional Level Cognition  Overall Cognitive Status: Impaired/Different from baseline Orientation Level: Oriented X4 Safety/Judgement: Decreased awareness of deficits, Decreased awareness of safety    Extremity Assessment (includes Sensation/Coordination)  Upper Extremity Assessment: Overall WFL for tasks assessed  Lower Extremity Assessment: RLE deficits/detail RLE Deficits / Details: pt with 2/5 strength with hip flexion, 3/5 for knee flexion/extension    ADLs       Mobility  Overal bed mobility:  Needs Assistance Bed Mobility: Supine to Sit Supine to sit: Min guard General bed mobility comments: Pt seated edge of bed on arrival    Transfers  Overall transfer level: Needs assistance Equipment used: None, Crutches(crutch on L side only) Transfers: Sit to/from Stand Sit to Stand: Min assist General transfer comment: assist for stability with transition into standing from EOB    Ambulation / Gait / Stairs / Wheelchair Mobility  Ambulation/Gait Ambulation/Gait assistance: Mod assist Gait Distance (Feet): 100 Feet Assistive device: Crutches(Crutch on L side.  ) Gait Pattern/deviations: Step-through pattern, Decreased step length - left, Decreased step length - right, Decreased stride length, Wide base of support General Gait Details: Pt with Multiple LOB and R circumduction.  Pt required assistance to maintain balance.  He did require HHA on R and use of crutch in the L side.  Pt refusing to use RW for support.  Gait velocity: decreased    Posture / Balance Balance Overall balance assessment: Needs assistance Sitting-balance support: Feet supported Sitting balance-Leahy Scale: Good Standing balance support: During functional activity, Single extremity supported, Bilateral upper extremity supported Standing balance-Leahy Scale: Poor    Special needs/care consideration BiPAP/CPAP No CPM No Continuous Drip IV No Dialysis No         Life Vest No Oxygen No Special Bed No Trach Size No Wound Vac (area) No     Skin: Reports a skin rash                           Bowel mgmt: Last BM 05/30/18 Bladder mgmt: Voiding in urinal WDL Diabetic mgmt No Droplet Precautions: Yes, mask required    Previous Home Environment Living Arrangements: Parent Available Help at Discharge: Friend(s), Family, Available 24 hours/day Type of Home: House Home Layout: One level Home Access: Stairs to enter Entrance Stairs-Rails: Right, Left, Can reach both Entrance Stairs-Number of Steps: 3 Bathroom  Shower/Tub: Tub/shower unit Home Care Services: No  Discharge Living Setting Plans for Discharge Living Setting: House, Lives with (comment)(Lives with his mom.) Type of Home at Discharge: House Discharge Home Layout: One level Discharge Home Access: Stairs to enter Secretary/administrator of Steps: 3 Discharge Bathroom Shower/Tub: Tub/shower unit, Curtain Discharge Bathroom Toilet: Standard Discharge Bathroom Accessibility: Yes How Accessible: Accessible via walker Does the patient have any problems obtaining your medications?: No  Social/Family/Support Systems Patient Roles: Spouse, Parent, Other (Comment)(Separated from his wife, has 2 children, and a mom.) Contact Information: Chanda Busing - spouse - [phone] Anticipated Caregiver: self and 26 yo daughter Ability/Limitations of Caregiver: Mom goes to HD and mom works; 28 yo child is over a lot; has other friends/family to assist Caregiver Availability: Intermittent Discharge Plan Discussed with Primary Caregiver: Yes Is Caregiver In Agreement with Plan?: Yes Does Caregiver/Family have Issues with Lodging/Transportation while Pt is in Rehab?: No  Goals/Additional Needs Patient/Family Goal for Rehab: PT/OT/SLP mod I goals Expected length of stay: 7-10 days Cultural Considerations: None Dietary Needs: Regular diet, thin liquids Equipment Needs: TBD Pt/Family Agrees to Admission and willing to participate: Yes Program Orientation Provided & Reviewed with Pt/Caregiver Including Roles  & Responsibilities: Yes  Decrease burden of Care through IP rehab admission: N/A  Possible need for SNF placement upon discharge: Not planned  Patient Condition: This patient's condition remains as documented in the consult dated 05/30/18, in which the Rehabilitation Physician determined and documented that the patient's condition is appropriate for intensive rehabilitative care in an inpatient rehabilitation facility. Will admit to  inpatient rehab today.  Preadmission Screen Completed By:  Trish Mage, 05/31/2018 4:05 PM ______________________________________________________________________   Discussed status with Dr. Riley Kill on 05/31/18 at 1603 and received telephone approval for admission today.  Admission Coordinator:  Trish Mage, time 1603/Date 05/31/18

## 2018-06-01 ENCOUNTER — Inpatient Hospital Stay (HOSPITAL_COMMUNITY): Payer: Medicare HMO | Admitting: Occupational Therapy

## 2018-06-01 ENCOUNTER — Inpatient Hospital Stay (HOSPITAL_COMMUNITY): Payer: Medicare HMO | Admitting: Physical Therapy

## 2018-06-01 ENCOUNTER — Inpatient Hospital Stay (HOSPITAL_COMMUNITY): Payer: Medicare HMO | Admitting: Speech Pathology

## 2018-06-01 DIAGNOSIS — G8191 Hemiplegia, unspecified affecting right dominant side: Secondary | ICD-10-CM

## 2018-06-01 DIAGNOSIS — R270 Ataxia, unspecified: Secondary | ICD-10-CM

## 2018-06-01 DIAGNOSIS — Z8709 Personal history of other diseases of the respiratory system: Secondary | ICD-10-CM

## 2018-06-01 DIAGNOSIS — G35 Multiple sclerosis: Secondary | ICD-10-CM

## 2018-06-01 LAB — CBC WITH DIFFERENTIAL/PLATELET
Abs Immature Granulocytes: 0.04 10*3/uL (ref 0.00–0.07)
Basophils Absolute: 0 10*3/uL (ref 0.0–0.1)
Basophils Relative: 0 %
Eosinophils Absolute: 0 10*3/uL (ref 0.0–0.5)
Eosinophils Relative: 0 %
HCT: 39.9 % (ref 39.0–52.0)
Hemoglobin: 12.6 g/dL — ABNORMAL LOW (ref 13.0–17.0)
Immature Granulocytes: 1 %
LYMPHS ABS: 0.8 10*3/uL (ref 0.7–4.0)
Lymphocytes Relative: 13 %
MCH: 27.2 pg (ref 26.0–34.0)
MCHC: 31.6 g/dL (ref 30.0–36.0)
MCV: 86 fL (ref 80.0–100.0)
Monocytes Absolute: 0.4 10*3/uL (ref 0.1–1.0)
Monocytes Relative: 7 %
Neutro Abs: 4.7 10*3/uL (ref 1.7–7.7)
Neutrophils Relative %: 79 %
Platelets: 225 10*3/uL (ref 150–400)
RBC: 4.64 MIL/uL (ref 4.22–5.81)
RDW: 13.2 % (ref 11.5–15.5)
WBC: 5.9 10*3/uL (ref 4.0–10.5)
nRBC: 0 % (ref 0.0–0.2)

## 2018-06-01 LAB — COMPREHENSIVE METABOLIC PANEL
ALBUMIN: 2.8 g/dL — AB (ref 3.5–5.0)
ALT: 50 U/L — ABNORMAL HIGH (ref 0–44)
AST: 40 U/L (ref 15–41)
Alkaline Phosphatase: 39 U/L (ref 38–126)
Anion gap: 8 (ref 5–15)
BUN: 17 mg/dL (ref 6–20)
CHLORIDE: 106 mmol/L (ref 98–111)
CO2: 27 mmol/L (ref 22–32)
Calcium: 8.7 mg/dL — ABNORMAL LOW (ref 8.9–10.3)
Creatinine, Ser: 0.94 mg/dL (ref 0.61–1.24)
GFR calc Af Amer: >60 mL/min (ref >60)
GFR calc non Af Amer: >60 mL/min (ref >60)
Glucose, Bld: 89 mg/dL (ref 70–99)
Potassium: 4.3 mmol/L (ref 3.5–5.1)
Sodium: 141 mmol/L (ref 135–145)
Total Bilirubin: 0.4 mg/dL (ref 0.3–1.2)
Total Protein: 6 g/dL — ABNORMAL LOW (ref 6.5–8.1)

## 2018-06-01 NOTE — H&P (Addendum)
Physical Medicine and Rehabilitation Admission H&P        Chief Complaint  Patient presents with  . Debility due to MS pseudoexacerbation.       HPI: [name] is a 38 year old male with history of HTN, T2DM, MS diagnosed 2010 ( CIR stay 03/2017) treated with tysarbri till approximately a year ago.  He was admitted on 05/28/18 with reports of worsening of right sided weakness, malaise and falls for 48 hours PTA. MRI brain showed advanced MS--relatively stable without active demyelination. He was febrile at admission with T 101.4 and was positive for H flu therefore started on Oseltamivir for treatment.  He was treated with solumedrol X 3 days for pseudoexacerbation of MS per neurology input. He has defervesced but continues to be limited by weakness, poor safety awareness and cognitive deficits affecting functional status. CIR recommended for follow up therapy.      Review of Systems  Constitutional: Negative for chills and fever.  HENT: Negative for hearing loss and tinnitus.   Eyes: Positive for blurred vision. Negative for double vision.  Respiratory: Negative for cough, sputum production, shortness of breath and wheezing.   Cardiovascular: Negative for chest pain, palpitations and leg swelling.  Gastrointestinal: Negative for constipation, heartburn and nausea.  Genitourinary: Negative for dysuria and urgency.  Musculoskeletal: Negative for joint pain and myalgias.  Skin: Negative for rash.  Neurological: Positive for sensory change and focal weakness. Negative for dizziness and headaches.            Past Medical History:  Diagnosis Date  . Depression    . Diabetes mellitus without complication (HCC)    . Eye abnormalities      unspecified by pt  . Hypertension    . Multiple sclerosis (HCC)    . Weakness      generalized           Past Surgical History:  Procedure Laterality Date  . WISDOM TOOTH EXTRACTION               Family History  Adopted: Yes  Problem  Relation Age of Onset  . Multiple sclerosis Cousin    . Heart disease Mother    . Other Father          Does not know his father.      Social History:  Lives with family. Per reports that he has been smoking cigarettes. He has been smoking about 1.00 pack per day. He has never used smokeless tobacco. He reports current alcohol use of about 12.0 standard drinks of alcohol per week. He reports current drug use. Frequency: 30.00 times per week. Drug: Marijuana.      Allergies: No Known Allergies            Medications Prior to Admission  Medication Sig Dispense Refill  . cyclobenzaprine (FLEXERIL) 10 MG tablet Take 1 tablet (10 mg total) by mouth 3 (three) times daily as needed for muscle spasms. (Patient not taking: Reported on 05/28/2018) 20 tablet 0      Drug Regimen Review  Drug regimen was reviewed and remains appropriate with no significant issues identified   Home: Home Living Family/patient expects to be discharged to:: Private residence Living Arrangements: Parent Available Help at Discharge: Friend(s), Family, Available 24 hours/day Type of Home: House Home Access: Stairs to enter [employer] of Steps: 3 Entrance Stairs-Rails: Right, Left, Can reach both Home Layout: One level Bathroom Shower/Tub: Tub/shower unit Home Equipment: None   Functional History:  Prior Function Level of Independence: Independent   Functional Status:  Mobility: Bed Mobility Overal bed mobility: Needs Assistance Bed Mobility: Supine to Sit Supine to sit: Min guard General bed mobility comments: Pt seated edge of bed on arrival Transfers Overall transfer level: Needs assistance Equipment used: None, Crutches(crutch on L side only) Transfers: Sit to/from Stand Sit to Stand: Min assist General transfer comment: assist for stability with transition into standing from EOB Ambulation/Gait Ambulation/Gait assistance: Mod assist Gait Distance (Feet): 100 Feet Assistive device:  Crutches(Crutch on L side.  ) Gait Pattern/deviations: Step-through pattern, Decreased step length - left, Decreased step length - right, Decreased stride length, Wide base of support General Gait Details: Pt with Multiple LOB and R circumduction.  Pt required assistance to maintain balance.  He did require HHA on R and use of crutch in the L side.  Pt refusing to use RW for support.   Gait velocity: decreased   ADL:   Cognition: Cognition Overall Cognitive Status: Impaired/Different from baseline Orientation Level: Oriented X4 Cognition Arousal/Alertness: Awake/alert Behavior During Therapy: WFL for tasks assessed/performed Overall Cognitive Status: Impaired/Different from baseline Area of Impairment: Safety/judgement Safety/Judgement: Decreased awareness of deficits, Decreased awareness of safety       Blood pressure 130/79, pulse (!) 58, temperature 98.9 F (37.2 C), temperature source Oral, resp. rate 18, height 5\' 10"  (1.778 m), weight 78.1 kg, SpO2 95 %. Physical Exam  Nursing note and vitals reviewed. Constitutional: He is oriented to person, place, and time. He appears well-developed and well-nourished. No distress.  Neurological: He is alert and oriented to person, place, and time.  Speech ataxic at times. Has poor insight regarding his disease. Able to follow basic commands without difficulty.   Skin: He is not diaphoretic.   Motor strength is 4/5 in the right deltoid bicep tricep grip hip flexor knee extensor ankle dorsiflexor There is moderate dysmetria on the right side Motor strength is 5/5 in the left deltoid bicep tricep grip hip flexor knee extensor ankle dorsiflexor mild dysmetria on the left side   Lab Results Last 48 Hours  Results for orders placed or performed during the hospital encounter of 05/28/18 (from the past 48 hour(s))  CBC     Status: Abnormal    Collection Time: 05/30/18  7:16 AM  Result Value Ref Range    WBC 10.2 4.0 - 10.5 K/uL    RBC 4.65  4.22 - 5.81 MIL/uL    Hemoglobin 12.8 (L) 13.0 - 17.0 g/dL    HCT 16.1 09.6 - 04.5 %    MCV 85.8 80.0 - 100.0 fL    MCH 27.5 26.0 - 34.0 pg    MCHC 32.1 30.0 - 36.0 g/dL    RDW 40.9 81.1 - 91.4 %    Platelets 236 150 - 400 K/uL    nRBC 0.0 0.0 - 0.2 %      Comment: Performed at [facility], [address] [place], [place] [address]  Comprehensive metabolic panel     Status: Abnormal    Collection Time: 05/30/18  7:16 AM  Result Value Ref Range    Sodium 140 135 - 145 mmol/L    Potassium 4.1 3.5 - 5.1 mmol/L    Chloride 104 98 - 111 mmol/L    CO2 25 22 - 32 mmol/L    Glucose, Bld 165 (H) 70 - 99 mg/dL    BUN 12 6 - 20 mg/dL    Creatinine, Ser 6.21 0.61 - 1.24 mg/dL  Calcium 9.0 8.9 - 10.3 mg/dL    Total Protein 6.9 6.5 - 8.1 g/dL    Albumin 3.4 (L) 3.5 - 5.0 g/dL    AST 30 15 - 41 U/L    ALT 18 0 - 44 U/L    Alkaline Phosphatase 44 38 - 126 U/L    Total Bilirubin 0.4 0.3 - 1.2 mg/dL    GFR calc non Af Amer >60 >60 mL/min    GFR calc Af Amer >60 >60 mL/min    Anion gap 11 5 - 15      Comment: Performed at [facility], [address] [place], [place] [address]  Magnesium     Status: None    Collection Time: 05/30/18  7:16 AM  Result Value Ref Range    Magnesium 1.8 1.7 - 2.4 mg/dL      Comment: Performed at [facility], [address] [place], [place] [address]  CBC     Status: Abnormal    Collection Time: 05/31/18  6:39 AM  Result Value Ref Range    WBC 13.5 (H) 4.0 - 10.5 K/uL    RBC 4.45 4.22 - 5.81 MIL/uL    Hemoglobin 12.5 (L) 13.0 - 17.0 g/dL    HCT 09.838.3 (L) 11.939.0 - 52.0 %    MCV 86.1 80.0 - 100.0 fL    MCH 28.1 26.0 - 34.0 pg    MCHC 32.6 30.0 - 36.0 g/dL    RDW 14.713.4 82.911.5 - 56.215.5 %    Platelets 241 150 - 400 K/uL    nRBC 0.0 0.0 - 0.2 %      Comment: Performed at [facility], [address] [place], [place] [address]  Basic metabolic panel     Status: Abnormal    Collection Time: 05/31/18  6:39 AM  Result Value Ref Range     Sodium 141 135 - 145 mmol/L    Potassium 4.1 3.5 - 5.1 mmol/L    Chloride 102 98 - 111 mmol/L    CO2 28 22 - 32 mmol/L    Glucose, Bld 110 (H) 70 - 99 mg/dL    BUN 13 6 - 20 mg/dL    Creatinine, Ser 5.780.94 0.61 - 1.24 mg/dL    Calcium 9.3 8.9 - 46.910.3 mg/dL    GFR calc non Af Amer >60 >60 mL/min    GFR calc Af Amer >60 >60 mL/min    Anion gap 11 5 - 15      Comment: Performed at [facility], [address] [place], [place] [address]  Magnesium     Status: None    Collection Time: 05/31/18  6:39 AM  Result Value Ref Range    Magnesium 1.9 1.7 - 2.4 mg/dL      Comment: Performed at [facility], [address] [place], [place] [address]      Imaging Results (Last 48 hours)  No results found.           Medical Problem List and Plan: 1. MS pseudoexacerbation  secondary to Influenza 2.  DVT Prophylaxis/Anticoagulation: Pharmaceutical: Lovenox 3. Pain Management: tylenol prn 4. Mood: LCSW to follow for evaluation and support.  5. Neuropsych: This patient is not fully capable of making decisions on his own behalf. 6. Skin/Wound Care: Routine pressure relief measures.  7. Fluids/Electrolytes/Nutrition: Monitor I/O. Check lytes in am.  8. Flu: started on Tamiflu--D # 3/5. Continue respiratory precautions. 9. HTN: Likely due to  steroids. Monitor BP bid for trend--add agent if it continues to be elevated.  10. Leucocytosis: Likely due to steroids and infection. Monitor for trend.  11. Asymptomatic bradycardia: Intermittent lows with HR in 50's. Monitor for symptoms.       "I have personally performed a face to face diagnostic evaluation of this patient.  Additionally, I have reviewed and concur with the physician assistant's documentation above."  Erick Colace M.D. [facility] FAAPM&R (Sports Med, Neuromuscular Med) Diplomate Am Board of Electrodiagnostic Med    Jerene Pitch 05/31/2018  Post Admission Physician Evaluation: 1. Functional  deficits secondary  to MS exacerbation. 2. Patient is admitted to receive collaborative, interdisciplinary care between the physiatrist, rehab nursing staff, and therapy team. 3. Patient's level of medical complexity and substantial therapy needs in context of that medical necessity cannot be provided at a lesser intensity of care such as a SNF. 4. Patient has experienced substantial functional loss from his/her baseline which was documented above under the "Functional History" and "Functional Status" headings.  Judging by the patient's diagnosis, physical exam, and functional history, the patient has potential for functional progress which will result in measurable gains while on inpatient rehab.  These gains will be of substantial and practical use upon discharge  in facilitating mobility and self-care at the household level. 5. Physiatrist will provide 24 hour management of medical needs as well as oversight of the therapy plan/treatment and provide guidance as appropriate regarding the interaction of the two. 6. The Preadmission Screening has been reviewed and patient status is unchanged unless otherwise stated above. 7. 24 hour rehab nursing will assist with bladder management, bowel management, safety, skin/wound care, disease management, medication administration and patient education  and help integrate therapy concepts, techniques,education, etc. 8. PT will assess and treat for/with: Lower extremity strength, range of motion, stamina, balance, functional mobility, safety, adaptive techniques and equipment, NMR, community reentry.   Goals are: mod I. 9. OT will assess and treat for/with: ADL's, functional mobility, safety, upper extremity strength, adaptive techniques and equipment, NMR, community reentry.   Goals are: mod I. Therapy may proceed with showering this patient. 10. SLP will assess and treat for/with: cognition, communication.  Goals are: mod I. 11. Case Management and Social Worker  will assess and treat for psychological issues and discharge planning. 12. Team conference will be held weekly to assess progress toward goals and to determine barriers to discharge. 13. Patient will receive at least 3 hours of therapy per day at least 5 days per week. 14. ELOS: 7-10 days       15. Prognosis:  excellent     Ranelle Oyster, MD, [place]

## 2018-06-01 NOTE — Evaluation (Signed)
Speech Language Pathology Assessment and Plan  Patient Details  Name: [name] MRN: [id number] Date of Birth: [birth year]  Evaluation Only  Today's Date: 06/01/2018 SLP Individual Time: 1200-1300 SLP Individual Time Calculation (min): 60 min   Problem List:  Patient Active Problem List   Diagnosis Date Noted  . Muscle spasm 11/17/2017  . Homicidal ideation 11/17/2017  . Dysesthesia   . Muscle spasms of neck   . Neuropathic pain   . Vitamin B12 deficiency   . Hypoalbuminemia   . Urinary frequency   . Tobacco abuse   . Steroid-induced hyperglycemia   . Marijuana abuse, continuous 04/12/2017  . Vitamin D deficiency 04/12/2017  . Multiple sclerosis exacerbation ([place])   . Abnormality of gait 05/10/2016  . Weakness   . Hypertension   . Depression   . Multiple sclerosis ([place]) 03/02/2011   Past Medical History:  Past Medical History:  Diagnosis Date  . Depression   . Diabetes mellitus without complication ([place])   . Eye abnormalities    unspecified by pt  . Hypertension   . Multiple sclerosis ([place])   . Weakness    generalized   Past Surgical History:  Past Surgical History:  Procedure Laterality Date  . WISDOM TOOTH EXTRACTION      Assessment / Plan / Recommendation Clinical Impression [name] is a 38 year old male with history of HTN, T2DM, MS diagnosed 2010 ( CIR stay 03/2017) treated with tysarbri till approximately a year ago. He was admitted on 05/28/18 with reports of worsening of right sided weakness, malaiseand falls for 48 hours PTA. MRI brain showed advanced MS--relatively stable without active demyelination. He was febrile at admission with T 101.4 and was positive for H flu therefore started on Oseltamivir for treatment. He was treated with solumedrol X 3 days for pseudoexacerbation of MS per neurology input. He has defervesced but continues to be limited by weakness, poor safety awareness and cognitive deficits affecting functional status.   CIR  recommended for follow up therapy with admission on 05/31/18. Cognitive linguistic evaluation provided on 06/01/18. Pt is oriented x 4, demonstrates functional speech intelligibility and good selective attention within distracting environment. Of note, pt consuming regular lunch tray without any issues of oropharyngeal dysphagia or aspiration. Pt is verbose about MS and becomes insulted when redirected. He demonstrates poor insight into his disease process and is very self-directed with medical management. During this evaluation pt was very detailed in his thought process towards self-medicating with "pot" instead of medically driven treatments. At discharge, pt advises that he will have family "waiting on him, so it will be like here (i.e., hospital), I won't have to raise a finger to do anything." Pt doesn't present with any acute neurological impairment and as a result he doesn't demonstrate any acute cognitive linguistic deficits. No further services are indicated at this time.    Skilled Therapeutic Interventions          Skilled treatment session focused on completion of above mentioned assessments. Education provided to pt on stable cognitive linguistic function. Pt agreeable to no further need for ST services.    SLP Assessment  Patient does not need any further Speech Lanaguage Pathology Services    Recommendations  Recommendations for Other Services: Neuropsych consult Patient destination: Home Follow up Recommendations: None Equipment Recommended: None recommended by SLP    SLP Frequency N/A  SLP Duration  SLP Intensity  SLP Treatment/Interventions N/A  N/A  N/A   Pain Pain Assessment Pain  Scale: 0-10 Pain Score: 0-No pain  Prior Functioning Cognitive/Linguistic Baseline: Information not available Type of Home: House  Lives With: Family(with his mother and several other relatives) Available Help at Discharge: Family;Other (Comment) Vocation: On disability  Short Term  Goals: No short term goals set  Refer to Care Plan for Long Term Goals  Recommendations for other services: Neuropsych  Discharge Criteria: Patient will be discharged from SLP if patient refuses treatment 3 consecutive times without medical reason, if treatment goals not met, if there is a change in medical status, if patient makes no progress towards goals or if patient is discharged from hospital.  The above assessment, treatment plan, treatment alternatives and goals were discussed and mutually agreed upon: by patient  Yarima Penman 06/01/2018, 1:11 PM

## 2018-06-01 NOTE — Progress Notes (Signed)
Pt noted up ambulating in room unsupervised. Pt educated on fall risks and safety and instructed to call staff when needing or wanting to ambulate in room due to weakness and unsteady gait. Pt verbalized that he needed to use restroom and when he activated call system "they told me to hold on". Pt describes urinary urgency due to his multiple sclerosis, stating "when I got to go I got to go". Pt able to remove himself from chair with safety belt remaining intact and without activating alarm, pt states "you guys are kidding me, I know how to get out of this thing". Pt informed that the safety chair alarm was for sole purpose to serve as a reminder to not get up unassisted and to alert staff when he is attempting to get up unassisted for safety reasons. Pt is agreeable to not getting up without supervision or assistance from staff and to alert staff when needing any type of assistance. Pt informed that staff will be seated in hallway tonight to decrease response time to call system. Pt accepting of terms.

## 2018-06-01 NOTE — Evaluation (Signed)
Occupational Therapy Assessment and Plan  Patient Details  Name: [name] MRN: [id number] Date of Birth: [birth year]  OT Diagnosis: acute pain, ataxia, hemiplegia affecting dominant side, muscle weakness (generalized) and pain in joint Rehab Potential: Rehab Potential (ACUTE ONLY): Good ELOS: 12-14 days   Today's Date: 06/01/2018 OT Individual Time: 9450-3888 OT Individual Time Calculation (min): 90 min     Problem List:  Patient Active Problem List   Diagnosis Date Noted  . Muscle spasm 11/17/2017  . Homicidal ideation 11/17/2017  . Dysesthesia   . Muscle spasms of neck   . Neuropathic pain   . Vitamin B12 deficiency   . Hypoalbuminemia   . Urinary frequency   . Tobacco abuse   . Steroid-induced hyperglycemia   . Marijuana abuse, continuous 04/12/2017  . Vitamin D deficiency 04/12/2017  . Multiple sclerosis exacerbation ([place])   . Abnormality of gait 05/10/2016  . Weakness   . Hypertension   . Depression   . Multiple sclerosis ([place]) 03/02/2011    Past Medical History:  Past Medical History:  Diagnosis Date  . Depression   . Diabetes mellitus without complication ([place])   . Eye abnormalities    unspecified by pt  . Hypertension   . Multiple sclerosis ([place])   . Weakness    generalized   Past Surgical History:  Past Surgical History:  Procedure Laterality Date  . WISDOM TOOTH EXTRACTION      Assessment & Plan Clinical Impression: Patient is a 38 y.o. year old male  with history of HTN, T2DM, MS diagnosed 2010 ( CIR stay 03/2017) treated with tysarbri till approximately a year ago. He was admitted on 05/28/18 with reports of worsening of right sided weakness, malaiseand falls for 48 hours PTA. MRI brain showed advanced MS--relatively stable without active demyelination. He was febrile at admission with T 101.4 and was positive for H flu therefore started on Oseltamivir for treatment. He was treated with solumedrol X 3 days for pseudoexacerbation of MS per  neurology input. He has defervesced but continues to be limited by weakness, poor safety awareness and cognitive deficits affecting functional status.    Patient transferred to CIR on 05/31/2018 .     Patient currently requires min with basic self-care skills secondary to muscle weakness, muscle joint tightness and muscle paralysis, impaired timing and sequencing, abnormal tone, unbalanced muscle activation, motor apraxia, ataxia and decreased coordination and decreased attention and decreased safety awareness.  Prior to hospitalization, patient could complete BADl with modified independent .  Patient will benefit from skilled intervention to increase independence with basic self-care skills prior to discharge home with care partner.  Anticipate patient will require intermittent supervision and follow up home health.  OT - End of Session Activity Tolerance: Tolerates 30+ min activity with multiple rests Endurance Deficit: Yes Endurance Deficit Description: takes sit rest breaks, and states that temperture affects his performance OT Assessment Rehab Potential (ACUTE ONLY): Good OT Patient demonstrates impairments in the following area(s): Balance;Endurance;Motor;Safety;Sensory OT Basic ADL's Functional Problem(s): Grooming;Bathing;Dressing;Toileting OT Transfers Functional Problem(s): Tub/Shower OT Plan OT Intensity: Minimum of 1-2 x/day, 45 to 90 minutes OT Frequency: 5 out of 7 days OT Duration/Estimated Length of Stay: 12-14 days OT Treatment/Interventions: Balance/vestibular training;Self Care/advanced ADL retraining;Therapeutic Exercise;UE/LE Strength taining/ROM;Pain management;Patient/family education;UE/LE Coordination activities;Functional mobility training;Therapeutic Activities;Discharge planning;DME/adaptive equipment instruction;Neuromuscular re-education OT Self Feeding Anticipated Outcome(s): independent OT Basic Self-Care Anticipated Outcome(s): supervision OT Toileting  Anticipated Outcome(s): supervision OT Bathroom Transfers Anticipated Outcome(s): supervision with toilet/ and shower OT Recommendation Recommendations  for Other Services: Neuropsych consult Patient destination: Home Follow Up Recommendations: Home health OT Equipment Details: Pt has shower seat   Skilled Therapeutic Intervention Focus of treatment was bed mobility, transfers,  Neuro-muscular reeducation, sitting balance, standing balance, attention, therapeutic activities, sustained attention, postural control .  Pt verbose during the initial assessment.  He stated he was not going to engage in therapy that he did not see a purpose.  Allowed pt to vent.  He agreed to shower.  Ambulated with no AD to bathroom and min assist for balance and mod assist for attention to body as he moved.  Pt transferred to toilet and then to shower stall.  Did standing with min assist and holding to grab bar.  Pt completed grooming at sink and returned to the recliner.  Left with all needs in place.     OT Evaluation Precautions/Restrictions  Precautions Precautions: Fall Precaution Comments: droplet Restrictions Weight Bearing Restrictions: No General   Vital Signs   Pain Pain Assessment Pain Scale: 0-10 Pain Score: 0-No pain Home Living/Prior Functioning Home Living Family/patient expects to be discharged to:: Private residence Living Arrangements: Parent Available Help at Discharge: Family, Other (Comment) Type of Home: House Home Access: Stairs to enter [employer] of Steps: 3 Entrance Stairs-Rails: Right, Left, Can reach both Home Layout: One level Bathroom Shower/Tub: Tub/shower unit, Curtain, Other (comment)(Has HH shower) Bathroom Toilet: Standard Bathroom Accessibility: Yes Additional Comments: Mother takes dialysis 3 days  a week  Lives With: Family(with his mother and several other relatives) IADL History Current License: Yes Mode of Transportation: Car Occupation: On  disability Leisure and Hobbies: guns (hunting), Community education officer, Designer, fashion/clothing, fishing, baseball Prior Function Level of Independence: Independent with basic ADLs, Independent with gait, Needs assistance with homemaking  Able to Take Stairs?: No Driving: Yes Vocation: On disability Leisure: Hobbies-yes (Comment) Comments: pt multiple falls over the last few months.  ADL ADL Eating: Independent Where Assessed-Eating: Bed level Grooming: Minimal assistance Where Assessed-Grooming: Standing at sink Upper Body Bathing: Minimal assistance Where Assessed-Upper Body Bathing: Shower Lower Body Bathing: Minimal assistance Where Assessed-Lower Body Bathing: Shower Upper Body Dressing: Minimal assistance Where Assessed-Upper Body Dressing: Sitting at sink Lower Body Dressing: Minimal assistance Where Assessed-Lower Body Dressing: Sitting at sink Toileting: Minimal assistance Where Assessed-Toileting: Glass blower/designer: Psychiatric nurse Method: Arts development officer: Energy manager: Moderate assistance Social research officer, government Method: Radiographer, therapeutic: Gaffer Baseline Vision/History: No visual deficits Patient Visual Report: Blurring of vision Vision Assessment?: Vision impaired- to be further tested in functional context Additional Comments: pt reports losing glasses roughly a year ago and has not been able to replace  Perception  Perception: Within Functional Limits Praxis Praxis: Intact Cognition Overall Cognitive Status: Difficult to assess Arousal/Alertness: Awake/alert Orientation Level: Person;Place;Situation Person: Oriented Situation: Oriented Year: 2020 Day of Week: Correct Memory: Appears intact Memory Recall: Sock;Blue Memory Recall Sock: Without Cue Memory Recall Blue: Without Cue Attention: Sustained Sustained Attention: Appears intact Awareness: Impaired Problem Solving:  Impaired Problem Solving Impairment: Functional complex Executive Function: Reasoning Reasoning: Appears intact Sequencing: Impaired Sequencing Impairment: Verbal complex;Functional complex Safety/Judgment: Impaired Comments: baseline poor awareness, safety, and problem solving.  Sensation Sensation Light Touch: Impaired by gross assessment(reports parathesia in the RLE) Proprioception: Impaired by gross assessment Coordination Gross Motor Movements are Fluid and Coordinated: No Fine Motor Movements are Fluid and Coordinated: No Coordination and Movement Description: Right UE weaker in ROM and strength.  Right shoulder 0-90  active shoulder flexion and abduction;  pain at end range;  stength 3/5 Motor  Motor Motor: Abnormal postural alignment and control;Abnormal tone;Motor impersistence Motor - Skilled Clinical Observations: R sided coordination deficits with extensor tone in the LE and ataxia/apraxia in the UE/LE Mobility  Bed Mobility Bed Mobility: Sit to Supine;Supine to Sit Supine to Sit: Supervision/Verbal cueing Sit to Supine: Supervision/Verbal cueing Transfers Sit to Stand: Contact Guard/Touching assist Stand to Sit: Contact Guard/Touching assist  Trunk/Postural Assessment  Cervical Assessment Cervical Assessment: Within Functional Limits Thoracic Assessment Thoracic Assessment: Within Functional Limits Lumbar Assessment Lumbar Assessment: Exceptions to [facility] Postural Control Postural Control: Deficits on evaluation Righting Reactions: delayed in standing  Protective Responses: delayed in standing   Balance Dynamic Sitting Balance Dynamic Sitting - Level of Assistance: 5: Stand by assistance Sitting balance - Comments: able to don /doff socks and wash feet in sitting position Static Standing Balance Static Standing - Level of Assistance: 4: Min assist Dynamic Standing Balance Dynamic Standing - Level of Assistance: 2: Max assist Extremity/Trunk Assessment RUE  Assessment Passive Range of Motion (PROM) Comments: shoulder flexion and abduction 0-100 degrees with PROM Active Range of Motion (AROM) Comments: shoulder flexion and abduction 0-90 degrees with AROM General Strength Comments: RUE= 3/5       Refer to Care Plan for Long Term Goals  Recommendations for other services: Neuropsych   Discharge Criteria: Patient will be discharged from OT if patient refuses treatment 3 consecutive times without medical reason, if treatment goals not met, if there is a change in medical status, if patient makes no progress towards goals or if patient is discharged from hospital.  The above assessment, treatment plan, treatment alternatives and goals were discussed and mutually agreed upon: by patient  Lisa Roca 06/01/2018, 5:12 PM

## 2018-06-01 NOTE — Plan of Care (Signed)
  Problem: Consults Goal: RH GENERAL PATIENT EDUCATION Description See Patient Education module for education specifics. Outcome: Progressing   Problem: RH SKIN INTEGRITY Goal: RH STG SKIN FREE OF INFECTION/BREAKDOWN Description No new breakdown with mod I  assist   Outcome: Progressing   Problem: RH SAFETY Goal: RH STG ADHERE TO SAFETY PRECAUTIONS W/ASSISTANCE/DEVICE Description STG Adhere to Safety Precautions With Min Assistance/Device.  Outcome: Progressing

## 2018-06-01 NOTE — Evaluation (Signed)
Physical Therapy Assessment and Plan  Patient Details  Name: [name] MRN: [id number] Date of Birth: 17-Sep-1980  PT Diagnosis: Abnormal posture, Abnormality of gait, Impaired sensation and Muscle weakness Rehab Potential: Fair ELOS: 9-12 days    Today's Date: 06/01/2018 PT Individual Time: 1405-1505 PT Individual Time Calculation (min): 60 min    Problem List:  Patient Active Problem List   Diagnosis Date Noted  . Muscle spasm 11/17/2017  . Homicidal ideation 11/17/2017  . Dysesthesia   . Muscle spasms of neck   . Neuropathic pain   . Vitamin B12 deficiency   . Hypoalbuminemia   . Urinary frequency   . Tobacco abuse   . Steroid-induced hyperglycemia   . Marijuana abuse, continuous 04/12/2017  . Vitamin D deficiency 04/12/2017  . Multiple sclerosis exacerbation ([place])   . Abnormality of gait 05/10/2016  . Weakness   . Hypertension   . Depression   . Multiple sclerosis ([place]) 03/02/2011    Past Medical History:  Past Medical History:  Diagnosis Date  . Depression   . Diabetes mellitus without complication ([place])   . Eye abnormalities    unspecified by pt  . Hypertension   . Multiple sclerosis ([place])   . Weakness    generalized   Past Surgical History:  Past Surgical History:  Procedure Laterality Date  . WISDOM TOOTH EXTRACTION      Assessment & Plan Clinical Impression: Patient is a 38 year old male with history of HTN, T2DM, MS diagnosed 2010 ( CIR stay 03/2017) treated with tysarbri till approximately a year ago. He was admitted on 05/28/18 with reports of worsening of right sided weakness, malaiseand falls for 48 hours PTA. MRI brain showed advanced MS--relatively stable without active demyelination. He was febrile at admission with T 101.4 and was positive for H flu therefore started on Oseltamivir for treatment. He was treated with solumedrol X 3 days for pseudoexacerbation of MS per neurology input. He has defervesced but continues to be limited by  weakness, poor safety awareness and cognitive deficits affecting functional status.Patient transferred to CIR on 05/31/2018 .   Patient currently requires max with mobility secondary to muscle weakness and muscle joint tightness, decreased cardiorespiratoy endurance, impaired timing and sequencing, unbalanced muscle activation, motor apraxia, ataxia and decreased coordination, decreased awareness, decreased problem solving, decreased safety awareness and delayed processing and decreased standing balance, decreased postural control, hemiplegia, decreased balance strategies and difficulty maintaining precautions.  Prior to hospitalization, patient was modified independent  with mobility, constant falls and lived with Family(with his mother and several other relatives) in a House home.  Home access is 3Stairs to enter.  Patient will benefit from skilled PT intervention to maximize safe functional mobility, minimize fall risk and decrease caregiver burden for planned discharge home with 24 hour supervision.  Anticipate patient will benefit from follow up [place] at discharge.  PT - End of Session Activity Tolerance: Tolerates 10 - 20 min activity with multiple rests Endurance Deficit: Yes PT Assessment Rehab Potential (ACUTE/IP ONLY): Fair PT Barriers to Discharge: [place] home environment;Decreased caregiver support;Medical stability;Home environment access/layout;Lack of/limited family support;Medication compliance;Behavior PT Patient demonstrates impairments in the following area(s): Balance;Behavior;Endurance;Motor;Pain;Perception;Safety;Sensory;Skin Integrity PT Transfers Functional Problem(s): Bed Mobility;Bed to Chair;Car;Furniture;Floor PT Locomotion Functional Problem(s): Ambulation;Wheelchair Mobility;Stairs PT Plan PT Intensity: Minimum of 1-2 x/day ,45 to 90 minutes PT Frequency: 5 out of 7 days PT Duration Estimated Length of Stay: 9-12 days  PT Treatment/Interventions: Ambulation/gait  training;Cognitive remediation/compensation;Discharge planning;DME/adaptive equipment instruction;Functional mobility training;Pain management;Psychosocial support;Splinting/orthotics;Therapeutic Activities;UE/LE  Strength taining/ROM;Visual/perceptual remediation/compensation;Wheelchair propulsion/positioning;UE/LE Coordination activities;Stair training;Skin care/wound management;Therapeutic Exercise;Patient/family education;Neuromuscular re-education;Functional electrical stimulation;Disease management/prevention;Community reintegration;Balance/vestibular training PT Transfers Anticipated Outcome(s): supervision assist  PT Locomotion Anticipated Outcome(s): ambulatory with min assist with LRAD. supervision assist WC mobility   PT Recommendation Follow Up Recommendations: Home health PT Patient destination: Home Equipment Recommended: Rolling walker with 5" wheels;Wheelchair cushion (measurements);Wheelchair (measurements)  Skilled Therapeutic Intervention Pt received sitting in [facility] and agreeable to PT. PT instructed patient in PT Evaluation and initiated treatment intervention; see below for results. PT educated patient in [place], rehab potential, rehab goals, and discharge recommendations. Car transfer with min assist from PT for safety. Education provided to pt on importance of proper use of AD to improve safety upon d/c. Gait without AD x 17f with max assist from PT to prevent multiple LOB to the R and L. [facility] returned to [facility] and [facility] and all needs met.     PT Evaluation Precautions/Restrictions Precautions Precautions: Fall Precaution Comments: droplet Restrictions Weight Bearing Restrictions: No General   Vital Signs  Pain Pain Assessment Pain Scale: 0-10 Pain Score: 0-No pain Home Living/Prior Functioning Home Living Available Help at Discharge: Family;Other (Comment) Type of Home: House Home Access: Stairs to enter [employer]  Steps: 3 Entrance Stairs-Rails: Right;Left;Can reach both Home Layout: One level Bathroom Shower/Tub: Tub/shower unit;Curtain;Other (comment)(Has HH shower) Bathroom Toilet: Standard Bathroom Accessibility: Yes Additional Comments: Mother takes dialysis 3 days  a week  Lives With: Family(with his mother and several other relatives) Prior Function Level of Independence: Independent with basic ADLs;Independent with gait;Needs assistance with homemaking  Able to Take Stairs?: No Driving: Yes Vocation: On disability Leisure: Hobbies-yes (Comment) Comments: pt multiple falls over the last few months.  Vision/Perception  Vision - Assessment Additional Comments: pt reports losing glasses roughly a year ago and has not been able to replace   Cognition Overall Cognitive Status: Difficult to assess Arousal/Alertness: Awake/alert Orientation Level: Oriented X4 Memory: Appears intact Awareness: Impaired Problem Solving: Impaired Safety/Judgment: Impaired Comments: baseline poor awareness, safety, and problem solving.  Sensation Sensation Light Touch: Impaired by gross assessment(reports parathesia in the RLE) Proprioception: Impaired by gross assessment Coordination Gross Motor Movements are Fluid and Coordinated: No Fine Motor Movements are Fluid and Coordinated: No Coordination and Movement Description: extensor tone in the RLE. poor coordination with ambulation  Motor  Motor Motor: Abnormal tone;Motor impersistence Motor - Skilled Clinical Observations: R sided coordination deficits with extensor tone in the LE and ataxia/apraxia in the UE/LE  Mobility Bed Mobility Bed Mobility: Sit to Supine;Supine to Sit Supine to Sit: Supervision/Verbal cueing Sit to Supine: Supervision/Verbal cueing Transfers Transfers: Sit to Stand;Stand to Sit;Stand Pivot Transfers Sit to Stand: Contact Guard/Touching assist Stand to Sit: Contact Guard/Touching assist Stand Pivot Transfers: Minimal  Assistance - Patient > 75% Locomotion  Gait Ambulation: Yes Gait Assistance: Maximal Assistance - Patient 25-49% Gait Distance (Feet): 40 Feet Assistive device: None Gait Gait: Yes Gait Pattern: Impaired Gait Pattern: Scissoring;Right circumduction;Decreased weight shift to left Stairs / Additional Locomotion Stairs: No Wheelchair Mobility Wheelchair Mobility: Yes Wheelchair Assistance: SChartered loss adjuster Both upper extremities Wheelchair Parts Management: Needs assistance;Supervision/cueing Distance: 2026f  Trunk/Postural Assessment  Cervical Assessment Cervical Assessment: Within Functional Limits Thoracic Assessment Thoracic Assessment: Within Functional Limits Lumbar Assessment Lumbar Assessment: Exceptions to [facility] Incostural Control Postural Control: Deficits on evaluation Righting Reactions: delayed in standing  Protective Responses: delayed in standing   Balance Dynamic Sitting Balance Dynamic Sitting - Level  of Assistance: 5: Stand by assistance Static Standing Balance Static Standing - Level of Assistance: 4: Min assist Dynamic Standing Balance Dynamic Standing - Level of Assistance: 2: Max assist(no UE support ) Extremity Assessment      RLE Assessment RLE Assessment: Exceptions to [facility] General Strength Comments: grossly 4/5 with extensor tone with activation  LLE Assessment LLE Assessment: Within Functional Limits    Refer to Care Plan for Long Term Goals  Recommendations for other services: None   Discharge Criteria: Patient will be discharged from PT if patient refuses treatment 3 consecutive times without medical reason, if treatment goals not met, if there is a change in medical status, if patient makes no progress towards goals or if patient is discharged from hospital.  The above assessment, treatment plan, treatment alternatives and goals were discussed and mutually agreed upon: by patient  Lorie Phenix 06/01/2018,  3:42 PM

## 2018-06-02 DIAGNOSIS — R269 Unspecified abnormalities of gait and mobility: Secondary | ICD-10-CM

## 2018-06-02 NOTE — Progress Notes (Signed)
At [facility], patient asked RN to get "flash drive", that he had dropped, on floor. Vape pen on floor. Reminded patient of no tobacco, including vaping on hospital property. Placed in personal belonging bag, out of patient's reach. He will give it to family today. Will pass on to charge nurse and patient's oncoming nurse. Alfredo Martinez A

## 2018-06-02 NOTE — Progress Notes (Signed)
[facility] PHYSICAL MEDICINE & REHABILITATION PROGRESS NOTE   Subjective/Complaints:  Patient slept okay.  Would like to do a grounds pass.  He states that his daughter is 38 years old.  He would like to go outside.  Review of systems denies chest pain shortness of breath nausea vomiting diarrhea constipation   Objective:   No results found. Recent Labs    05/31/18 0639 06/01/18 0649  WBC 13.5* 5.9  HGB 12.5* 12.6*  HCT 38.3* 39.9  PLT 241 225   Recent Labs    05/31/18 0639 06/01/18 0649  NA 141 141  K 4.1 4.3  CL 102 106  CO2 28 27  GLUCOSE 110* 89  BUN 13 17  CREATININE 0.94 0.94  CALCIUM 9.3 8.7*    Intake/Output Summary (Last 24 hours) at 06/02/2018 0930 Last data filed at 06/02/2018 0459 Gross per 24 hour  Intake 720 ml  Output 1075 ml  Net -355 ml     Physical Exam: Vital Signs Blood pressure (!) 126/59, pulse (!) 55, temperature 98.2 F (36.8 C), temperature source Oral, resp. rate 18, height 5\' 10"  (1.778 m), weight 78 kg, SpO2 99 %.  General: No acute distress Mood and affect are appropriate Heart: Regular rate and rhythm no rubs murmurs or extra sounds Lungs: Clear to auscultation, breathing unlabored, no rales or wheezes Abdomen: Positive bowel sounds, soft nontender to palpation, nondistended Extremities: No clubbing, cyanosis, or edema Skin: No evidence of breakdown, no evidence of rash Neurologic: Cranial nerves II through XII intact, motor strength is 5/5 in left and 4/5 in right deltoid, bicep, tricep, grip, hip flexor, knee extensors, ankle dorsiflexor and plantar flexor Sensory exam normal sensation to light touch and proprioception in bilateral upper and lower extremities Cerebellar exam moderate ataxia on right and mild ataxia on left l finger to nose to finger as well as heel to shin in bilateral upper and lower extremities Musculoskeletal: Full range of motion in all 4 extremities. No joint swelling    Assessment/Plan: 1. Functional  deficits secondary to MS pseudo-exacerbation with increased ataxia and balance issues which require 3+ hours per day of interdisciplinary therapy in a comprehensive inpatient rehab setting.  Physiatrist is providing close team supervision and 24 hour management of active medical problems listed below.  Physiatrist and rehab team continue to assess barriers to discharge/monitor patient progress toward functional and medical goals  Care Tool:  Bathing    Body parts bathed by patient: Right arm, Left arm, Chest, Abdomen, Front perineal area, Buttocks, Right upper leg, Left upper leg, Face   Body parts bathed by helper: Right lower leg, Left lower leg     Bathing assist Assist Level: Minimal Assistance - Patient > 75%     Upper Body Dressing/Undressing Upper body dressing   What is the patient wearing?: Pull over shirt    Upper body assist Assist Level: Minimal Assistance - Patient > 75%    Lower Body Dressing/Undressing Lower body dressing      What is the patient wearing?: Pants     Lower body assist Assist for lower body dressing: Minimal Assistance - Patient > 75%     Toileting Toileting    Toileting assist Assist for toileting: Set up assist Assistive Device Comment: (urinal in bed)   Transfers Chair/bed transfer  Transfers assist     Chair/bed transfer assist level: Contact Guard/Touching assist     Locomotion Ambulation   Ambulation assist      Assist level: Maximal Assistance -  Patient 25 - 49%   Max distance: 48ft   Walk 10 feet activity   Assist     Assist level: Maximal Assistance - Patient 25 - 49%     Walk 50 feet activity   Assist Walk 50 feet with 2 turns activity did not occur: Safety/medical concerns         Walk 150 feet activity   Assist Walk 150 feet activity did not occur: Safety/medical concerns         Walk 10 feet on uneven surface  activity   Assist Walk 10 feet on uneven surfaces activity did not occur:  Safety/medical concerns         Wheelchair     Assist   Type of Wheelchair: Manual    Wheelchair assist level: Supervision/Verbal cueing Max wheelchair distance: 277ft     Wheelchair 50 feet with 2 turns activity    Assist        Assist Level: Supervision/Verbal cueing   Wheelchair 150 feet activity     Assist     Assist Level: Supervision/Verbal cueing    Medical Problem List and Plan: 1.MS pseudoexacerbationsecondary to Influenza, has a chronic mild right hemiparesis Continue CIR PT OT speech 2. DVT Prophylaxis/Anticoagulation: Pharmaceutical:Lovenox 3. Pain Management:tylenol prn 4. Mood:LCSW to follow for evaluation and support. 5. Neuropsych: This patientis not fullycapable of making decisions onhisown behalf. 6. Skin/Wound Care:Routine pressure relief measures. 7. Fluids/Electrolytes/Nutrition:Monitor I/O. Check lytes in am.  8. Flu: started on Tamiflu--D # 5/5. Continue respiratory precautions. 9. JQZ:ESPQZR due to steroids. Monitor BP bid for trend--add agent if it continues to be elevated.  10. Leucocytosis: Likely due tosteroids and infection. Monitor for trend. 11.Asymptomatic bradycardia: Intermittent lows with HR in 50's. Monitor forsymptoms.   Vitals:   06/01/18 1947 06/02/18 0456  BP: 119/77 (!) 126/59  Pulse: 79 (!) 55  Resp: 18 18  Temp: 98.1 F (36.7 C) 98.2 F (36.8 C)  SpO2: 100% 99%  No need for intervention  LOS: 2 days A FACE TO FACE EVALUATION WAS PERFORMED  Victorino Sparrow Kirsteins 06/02/2018, 9:30 AM

## 2018-06-03 ENCOUNTER — Inpatient Hospital Stay (HOSPITAL_COMMUNITY): Payer: Medicare HMO | Admitting: Physical Therapy

## 2018-06-03 ENCOUNTER — Encounter: Payer: Self-pay | Admitting: *Deleted

## 2018-06-03 ENCOUNTER — Inpatient Hospital Stay (HOSPITAL_COMMUNITY): Payer: Medicare HMO

## 2018-06-03 ENCOUNTER — Inpatient Hospital Stay (HOSPITAL_COMMUNITY): Payer: Medicare HMO | Admitting: Occupational Therapy

## 2018-06-03 LAB — CULTURE, BLOOD (ROUTINE X 2)
CULTURE: NO GROWTH
Culture: NO GROWTH
Special Requests: ADEQUATE
Special Requests: ADEQUATE

## 2018-06-03 MED ORDER — IBUPROFEN 400 MG PO TABS
600.0000 mg | ORAL_TABLET | Freq: Four times a day (QID) | ORAL | Status: DC | PRN
  Administered 2018-06-03 – 2018-06-05 (×2): 600 mg via ORAL
  Filled 2018-06-03 (×2): qty 2

## 2018-06-03 NOTE — Progress Notes (Signed)
Per nursing, patient was given "Data Collection Information Summary for Patients in Inpatient Rehabilitation Facilities with attached Privacy Act Statement Health Care Records" upon admission, which was given in the patient education notebook.    Patient information reviewed and entered into e[facility] System by Becky Joenathan Sakuma, PPS coordinator. Information including medical coding, function ability, and quality indicators will be reviewed and updated through discharge.   

## 2018-06-03 NOTE — Progress Notes (Signed)
[facility] PHYSICAL MEDICINE & REHABILITATION PROGRESS NOTE   Grant:  No new issues. Asked when he needed to go to therapy today and after which could he go home. Also wants grounds pass.   ROS: Patient denies fever, rash, sore throat, blurred vision, nausea, vomiting, diarrhea, cough, shortness of breath or chest pain, joint or back pain, headache, or mood change.    Objective:   No results found. Recent Labs    06/01/18 0649  WBC 5.9  HGB 12.6*  HCT 39.9  PLT 225   Recent Labs    06/01/18 0649  NA 141  K 4.3  CL 106  CO2 27  GLUCOSE 89  BUN 17  CREATININE 0.94  CALCIUM 8.7*    Intake/Output Summary (Last 24 hours) at 06/03/2018 1145 Last data filed at 06/03/2018 0803 Gross per 24 hour  Intake 960 ml  Output -  Net 960 ml     Physical Exam: Vital Signs Blood pressure 115/80, pulse 60, temperature 98 F (36.7 C), temperature source Oral, resp. rate 16, height 5\' 10"  (1.778 m), weight 78 kg, SpO2 100 %.  Constitutional: No distress . Vital signs reviewed. HEENT: EOMI, oral membranes moist Neck: supple Cardiovascular: RRR without murmur. No JVD    Respiratory: CTA Bilaterally without wheezes or rales. Normal effort    GI: BS +, non-tender, non-distended  Skin: No evidence of breakdown, no evidence of rash Neurologic: Cranial nerves II through XII intact, motor strength is 5/5 in left and 4/5 in right deltoid, bicep, tricep, grip, hip flexor, knee extensors, ankle dorsiflexor and plantar flexor Sensory exam normal sensation to light touch and proprioception in bilateral upper and lower extremities Ongoing ataxia on right and mild ataxia on left l finger to nose to finger as well as heel to shin in bilateral upper and lower extremities Musculoskeletal: Full range of motion in all 4 extremities. No joint swelling    Assessment/Plan: 1. Functional deficits secondary to MS pseudo-exacerbation with increased ataxia and balance issues which require 3+  hours per day of interdisciplinary therapy in a comprehensive inpatient rehab setting.  Physiatrist is providing close team supervision and 24 hour management of active medical problems listed below.  Physiatrist and rehab team continue to assess barriers to discharge/monitor patient progress toward functional and medical goals  Care Tool:  Bathing    Body parts bathed by patient: Right arm, Left arm, Chest, Abdomen, Front perineal area, Buttocks, Right upper leg, Left upper leg, Right lower leg, Left lower leg, Face   Body parts bathed by helper: Right lower leg, Left lower leg     Bathing assist Assist Level: Set up assist     Upper Body Dressing/Undressing Upper body dressing   What is the patient wearing?: Pull over shirt    Upper body assist Assist Level: Independent    Lower Body Dressing/Undressing Lower body dressing      What is the patient wearing?: Pants     Lower body assist Assist for lower body dressing: Independent     Toileting Toileting    Toileting assist Assist for toileting: Independent Assistive Device Comment: urinal   Transfers Chair/bed transfer  Transfers assist     Chair/bed transfer assist level: Supervision/Verbal cueing     Locomotion Ambulation   Ambulation assist      Assist level: Moderate Assistance - Patient 50 - 74% Assistive device: Hand held assist Max distance: 170 ft   Walk 10 feet activity   Assist  Assist level: Moderate Assistance - Patient - 50 - 74% Assistive device: Hand held assist   Walk 50 feet activity   Assist Walk 50 feet with 2 turns activity did not occur: Safety/medical concerns  Assist level: Moderate Assistance - Patient - 50 - 74% Assistive device: Hand held assist    Walk 150 feet activity   Assist Walk 150 feet activity did not occur: Safety/medical concerns  Assist level: Moderate Assistance - Patient - 50 - 74% Assistive device: Hand held assist    Walk 10 feet on  uneven surface  activity   Assist Walk 10 feet on uneven surfaces activity did not occur: Safety/medical concerns         Wheelchair     Assist   Type of Wheelchair: Manual    Wheelchair assist level: Supervision/Verbal cueing Max wheelchair distance: 28100ft     Wheelchair 50 feet with 2 turns activity    Assist        Assist Level: Supervision/Verbal cueing   Wheelchair 150 feet activity     Assist     Assist Level: Supervision/Verbal cueing    Medical Problem List and Plan: 1.MS pseudoexacerbationsecondary to Influenza, has a chronic mild right hemiparesis  Continue CIR PT OT speech  -grounds pass 2. DVT Prophylaxis/Anticoagulation: Pharmaceutical:Lovenox 3. Pain Management:tylenol prn 4. Mood:LCSW to follow for evaluation and support. 5. Neuropsych: This patientis not fullycapable of making decisions onhisown behalf. 6. Skin/Wound Care:Routine pressure relief measures. 7. Fluids/Electrolytes/Nutrition:Monitor I/O. Check lytes in am.  8. Flu: completed 5 d tamiflu. Continue respiratory precautions. 9. ZOX:[id number]:improved.  10. Leucocytosis:  resolved 11.Asymptomatic bradycardia: Intermittent lows with HR in 50's. Monitor forsymptoms.   Vitals:   06/02/18 1941 06/03/18 0440  BP: 114/70 115/80  Pulse: 65 60  Resp: 18 16  Temp: 98 F (36.7 C) 98 F (36.7 C)  SpO2: 100% 100%  No need for intervention  LOS: 3 days A FACE TO FACE EVALUATION WAS PERFORMED  [name] 06/03/2018, 11:45 AM

## 2018-06-03 NOTE — Progress Notes (Signed)
Occupational Therapy Session Note  Patient Details  Name: [name] MRN: [id number] Date of Birth: [birth year]  Today's Date: 06/03/2018 OT Individual Time: 3300-7622 OT Individual Time Calculation (min): 82 min    Short Term Goals: Week 1:  OT Short Term Goal 1 (Week 1): Pt will be independent with set up for meals OT Short Term Goal 2 (Week 1): Pt will be Supervision for transfer to toilet OT Short Term Goal 3 (Week 1): Pt will be min assist with tub/shower transfer OT Short Term Goal 4 (Week 1): Pt will be supervision with bathing OT Short Term Goal 5 (Week 1): Pt will be supervision with dressing  Skilled Therapeutic Interventions/Progress Updates:    Patient in w/c at start of session.  He is talkative, needs redirection t/o session but pleasant and willing to participate.  He denies pain.  Therapeutic activity to include standing balance activities with unilateral/bilateral UE support of bed rails with CG/min A for balance, CG/CS for sit to stand.  W/c propulsion on unit with CS.  Education provided regarding activity selection, goals for therapy and safety.  D[employer] activity - seated for 2 minutes:  Trial 1 with left UE = 1.35 sec, Trial 2 with right UE = 3.53 sec Box and bocks:  L = 40, R = 29 Patient remained in w/c at close of session with seat belt alarm set, he is able to set up tray for meal.   Call bell in reach.    Therapy Documentation Precautions:  Precautions Precautions: Fall Precaution Comments: droplet Restrictions Weight Bearing Restrictions: No General:   Vital Signs:   Pain: Pain Assessment Pain Scale: 0-10 Pain Score: 0-No pain   Other Treatments:     Therapy/Group: Individual Therapy  Barrie Lyme 06/03/2018, 12:07 PM

## 2018-06-03 NOTE — Progress Notes (Signed)
Patient noted taking chair alarm off placing it over his head while in chair. Patient unplugging alarm from the wall and turning the alarm off on the bed ambulating to the bathroom by himself. Patient not easily re-directed and is  non-compliant.

## 2018-06-03 NOTE — Progress Notes (Signed)
Physical Therapy Session Note  Patient Details  Name: [name] MRN: [id number] Date of Birth: [birth year]  Today's Date: 06/03/2018 PT Individual Time:  -      Short Term Goals: Week 1:  PT Short Term Goal 1 (Week 1): Pt will performed stand pivot transfer with CGA consistently  PT Short Term Goal 2 (Week 1): Pt will propell WC >250f with set up assist only  PT Short Term Goal 3 (Week 1): Pt will ambulate 371fwith mod assist and LRAD  PT Short Term Goal 4 (Week 1): Pt will ascend/descend 4   Skilled Therapeutic Interventions/Progress Updates:    Pt received in bed, willing to participate in therapy. Bed mobility with S. Stand pivot transfer to w/c with min guard for balance and safety. Pt propelled w/c with S, 200 ft to therapy gym. Sit to stand transfers with min guard throughout session, no physical assist given. Pt able to go from sit to supine on mat table with S. He reports R side is weaker than L side, and uses UEs to assist with R side during bed mobility. Exercises for hip flexor and extensor strength: 3x10 bridges with minA to keep R knee in center with L knee, 3x10 SLR bilateral, 3x5 clamshells with L LE. Ambulated [id number] gym to parallel bars and back with modA for balance and v/c for shorter, slower steps. Performed balance exercises in parallel bars: static balance with ball toss with feet close together with min guard; static balance in tandem stance 2 min with modA for maintenance of balance with no UE support and maxA for regaining balance with LOB; static balance with feet together x 2 min with min guard for balance, minA to regain balance with LOB. Dynamic balance stepping activity in parallel bars with modA for balance. Noted more difficulty with R LE steps, with swing through pattern. Ambulated 170 ft back to room with modA, HHA on pt's L side for balance. Noted circumduction pattern on R side to clear L LE. Stopped gait when pt became unsafe, trying to walk quicker to help  clear L LE. Pt propelled w/c back to room with S 30 ft. Pt left in w/c with alarm belt active, needs met, call bell in reach.  Therapy Documentation Precautions:  Precautions Precautions: Fall Precaution Comments: droplet Restrictions Weight Bearing Restrictions: No    Pain: Pain Assessment Pain Scale: 0-10 Pain Score: 0-No pain    Therapy/Group: Individual Therapy  EmmRonnell Guadalajara3/2020, 9:34 AM

## 2018-06-03 NOTE — Progress Notes (Signed)
Ranelle Grant, Henry T, MD  Physician  Physical Medicine and Rehabilitation  Consult Note  Signed  Date of Service:  05/30/2018 7:53 AM       Related encounter: ED to [facility]Ad[facility] (Discharged[facility]      Signed      Expand All Collapse All    Show:Clear all [x] Manual[x] Template[] Copied  Added by: [x] Angiulli, Mcarthur Rossettianiel J, PA-C[x] Ranelle Grant, Henry T, MD  [] Hover for details      Physical Medicine and Rehabilitation Consult Reason for Consult:  Decreased functional mobility Referring Physician: [facility]   HPI: [name] is a 38 y.o.right handed male with history of hypertension, diabetes mellitus, multiple sclerosis diagnosed 2010 maintained on Tysabri and  followed by neurology services Dr.Yan with poor compliance and most recent admission for MS exacerbation and received inpatient rehabilitation services 04/17/2017 04/25/2017. Per chart review patient lives with his mother. Reportedly independent prior to admission. One level home with 3 steps to entry. Mother does receive hemodialysis. Presented 05/28/2018 with increasing right sided weakness and multiple falls. MRI of the brain findings consistent with advanced multiple sclerosis relatively stable in appearance as compared to most recent MRI from 11/12/2017. No evidence for significant disease progression or active demyelination. Neurology consulted placed on intravenous Solu-Medrol for MS exacerbation. Influenza panel positive and placed on Tamiflu. Therapy evaluations completed with recommendations of physical medicine rehabilitation consult.   Review of Systems  Constitutional: Negative for fever.  HENT: Negative for hearing loss.   Eyes: Negative for blurred vision and double vision.  Cardiovascular: Positive for leg swelling. Negative for chest pain and claudication.  Gastrointestinal: Positive for constipation. Negative for nausea.  Genitourinary: Positive for urgency.  Negative for dysuria, flank pain and hematuria.  Musculoskeletal: Positive for falls.  Skin: Negative for rash.  Neurological: Positive for weakness.  Psychiatric/Behavioral: Positive for depression.       Anxiety  All other systems reviewed and are negative.      Past Medical History:  Diagnosis Date  . Depression   . Diabetes mellitus without complication (HCC)   . Eye abnormalities    unspecified by pt  . Hypertension   . Multiple sclerosis (HCC)   . Weakness    generalized        Past Surgical History:  Procedure Laterality Date  . WISDOM TOOTH EXTRACTION          Family History  Adopted: Yes  Problem Relation Age of Onset  . Multiple sclerosis Cousin   . Heart disease Mother   . Other Father        Does not know his father.   Social History:  reports that he has been smoking cigarettes. He has been smoking about 1.00 pack per day. He has never used smokeless tobacco. He reports current alcohol use of about 12.0 standard drinks of alcohol per week. He reports current drug use. Frequency: 30.00 times per week. Drug: Marijuana. Allergies: No Known Allergies       Medications Prior to Admission  Medication Sig Dispense Refill  . cyclobenzaprine (FLEXERIL) 10 MG tablet Take 1 tablet (10 mg total) by mouth 3 (three) times daily as needed for muscle spasms. (Patient not taking: Reported on 05/28/2018) 20 tablet 0    Home: Home Living Family/patient expects to be discharged to:: Private residence Living Arrangements: Parent Available Help at Discharge: Friend(s), Family, Available 24 hours/day Type of Home: House Home Access: Stairs to enter Entergy CorporationEntrance Stairs-Number of Steps: 3 Entrance Stairs-Rails: Right, Left,  Can reach both Home Layout: One level Bathroom Shower/Tub: Tub/shower unit Home Equipment: None  Functional History: Prior Function Level of Independence: Independent Functional Status:  Mobility: Bed Mobility Overal bed mobility:  Needs Assistance Bed Mobility: Supine to Sit Supine to sit: Min guard General bed mobility comments: for safety Transfers Overall transfer level: Needs assistance Equipment used: None Transfers: Sit to/from Stand Sit to Stand: Min assist General transfer comment: assist for stability with transition into standing from EOB Ambulation/Gait Ambulation/Gait assistance: Mod assist, Min assist Gait Distance (Feet): 20 Feet Assistive device: None Gait Pattern/deviations: Step-through pattern, Decreased step length - left, Decreased step length - right, Decreased stride length General Gait Details: pt with modest instability requiring constant physical assistance without use of an AD; however, pt requesting to have one UE support on therapist's shoulder Gait velocity: decreased  ADL:  Cognition: Cognition Overall Cognitive Status: Impaired/Different from baseline Orientation Level: Oriented X4 Cognition Arousal/Alertness: Awake/alert Behavior During Therapy: WFL for tasks assessed/performed Overall Cognitive Status: Impaired/Different from baseline Area of Impairment: Safety/judgement Safety/Judgement: Decreased awareness of deficits, Decreased awareness of safety  Blood pressure (!) 155/64, pulse (!) 53, temperature 98.1 F (36.7 C), temperature source Oral, resp. rate 18, height 5\' 10"  (1.778 m), weight 78.1 kg, SpO2 100 %. Physical Exam  Constitutional: He is oriented to person, place, and time. He appears well-developed.  HENT:  Head: Normocephalic.  Eyes: Pupils are equal, round, and reactive to light.  Neck: Normal range of motion.  Cardiovascular: Normal rate.  Respiratory: Effort normal.  GI: Soft.  Musculoskeletal:        General: Tenderness present. No edema.  Neurological: He is alert and oriented to person, place, and time.  Patient is alert and very anxious. Follows commands. He is oriented to person place and time. RUE 3+ to 4-/5. LUE 4/5. RLE 3-4/5, LLE 4/5.  Decreased sensation to LT R>L. DTR's 3++ right, 2+ left.   Psychiatric:  Impulsive, anxious    LabResultsLast24Hours       Results for orders placed or performed during the hospital encounter of 05/28/18 (from the past 24 hour(s))  CBC     Status: Abnormal   Collection Time: 05/29/18  8:20 AM  Result Value Ref Range   WBC 4.2 4.0 - 10.5 K/uL   RBC 4.59 4.22 - 5.81 MIL/uL   Hemoglobin 12.8 (L) 13.0 - 17.0 g/dL   HCT 99.3 71.6 - 96.7 %   MCV 86.3 80.0 - 100.0 fL   MCH 27.9 26.0 - 34.0 pg   MCHC 32.3 30.0 - 36.0 g/dL   RDW 89.3 81.0 - 17.5 %   Platelets 243 150 - 400 K/uL   nRBC 0.0 0.0 - 0.2 %  Basic metabolic panel     Status: Abnormal   Collection Time: 05/29/18  8:20 AM  Result Value Ref Range   Sodium 138 135 - 145 mmol/L   Potassium 4.1 3.5 - 5.1 mmol/L   Chloride 103 98 - 111 mmol/L   CO2 23 22 - 32 mmol/L   Glucose, Bld 181 (H) 70 - 99 mg/dL   BUN 9 6 - 20 mg/dL   Creatinine, Ser 1.02 0.61 - 1.24 mg/dL   Calcium 9.0 8.9 - 58.5 mg/dL   GFR calc non Af Amer >60 >60 mL/min   GFR calc Af Amer >60 >60 mL/min   Anion gap 12 5 - 15      ImagingResults(Last48hours)  [name] Contrast  Result Date: 05/28/2018 CLINICAL DATA:  Initial evaluation for multiple sclerosis, new neurologic event. EXAM: MRI HEAD WITHOUT AND WITH CONTRAST TECHNIQUE: Multiplanar, multiecho pulse sequences of the brain and surrounding structures were obtained without and with intravenous contrast. CONTRAST:  80 cc of Gadavist. COMPARISON:  Prior MRI from 11/12/2017 FINDINGS: Brain: Advanced cerebral and cerebellar atrophy for age, stable. Extensive T2/FLAIR hyperintensities seen involving the periventricular, deep, and subcortical white matter both cerebral hemispheres in a distribution consistent with provided history of multiple sclerosis. Patchy involvement of the brainstem and cerebellum as well. Numerous T1 hypointense black holes. Overall, appearance is  grossly stable from previous without evidence for significant disease progression. No diffusion abnormality or abnormal enhancement to suggest active demyelination. No mass lesion, midline shift or mass effect. No hydrocephalus. No extra-axial fluid collection. No evidence for acute or subacute ischemia. No other abnormal enhancement. Pituitary gland within normal limits. Midline structures intact. Vascular: Major intracranial vascular flow voids maintained. Skull and upper cervical spine: Craniocervical junction normal. Bone marrow signal intensity normal. No scalp soft tissue abnormality. Sinuses/Orbits: Globes and orbital soft tissues within normal limits. Paranasal sinuses are largely clear. No significant mastoid effusion. Inner ear structures grossly normal. Other: None. IMPRESSION: 1. Findings consistent with advanced multiple sclerosis, relatively stable in appearance as compared to most recent MRI from 11/12/2017. No evidence for significant disease progression or active demyelination. 2. No other acute intracranial abnormality. Advanced atrophy for age. Electronically Signed   By: Rise Mu M.D.   On: 05/28/2018 20:12      Assessment/Plan: Diagnosis: MS exacerbation 1. Does the need for close, 24 hr/day medical supervision in concert with the patient's rehab needs make it unreasonable for this patient to be served in a less intensive setting? Yes 2. Co-Morbidities requiring supervision/potential complications: HTN, DM, Depression 3. Due to bladder management, bowel management, safety, skin/wound care, disease management, medication administration, pain management and patient education, does the patient require 24 hr/day rehab nursing? Yes 4. Does the patient require coordinated care of a physician, rehab nurse, PT (1-2 hrs/day, 5 days/week), OT (1-2 hrs/day, 5 days/week) and SLP (1-2 hrs/day, 5 days/week) to address physical and functional deficits in the context of the above medical  diagnosis(es)? Yes Addressing deficits in the following areas: balance, endurance, locomotion, strength, transferring, bowel/bladder control, bathing, dressing, feeding, grooming, toileting, cognition and psychosocial support 5. Can the patient actively participate in an intensive therapy program of at least 3 hrs of therapy per day at least 5 days per week? Yes 6. The potential for patient to make measurable gains while on inpatient rehab is good 7. Anticipated functional outcomes upon discharge from inpatient rehab are modified independent  with PT, modified independent with OT, modified independent with SLP. 8. Estimated rehab length of stay to reach the above functional goals is: 7-10 days 9. Anticipated D/C setting: Home 10. Anticipated post D/C treatments: HH therapy and Outpatient therapy 11. Overall Rehab/Functional Prognosis: good  RECOMMENDATIONS: This patient's condition is appropriate for continued rehabilitative care in the following setting: CIR Patient has agreed to participate in recommended program. Yes Note that insurance prior authorization may be required for reimbursement for recommended care.  Comment: Spent extensive time discussing goals of rehab and expectations moving forward from a safety and mobility standpoint. He wants to maintain his independence and mobility but hasn't always been able to demonstrate good insight and awareness. This same problem carries over to his MS treatment regarding maintaining his follow up. He is very anxious and somewhat suspicious of his prior medical care, but  some of that is born out of the limitations in his insight. Rehab Admissions Coordinator to follow up.  Thanks,  Ranelle Oyster, MD, [place] Dom  I have personally performed a face to face diagnostic evaluation of this patient. Additionally, I have reviewed and concur with the physician assistant's documentation above.    Mcarthur Rossetti Angiulli, PA-C 05/30/2018        Revision  History                        Routing History

## 2018-06-03 NOTE — Progress Notes (Signed)
Occupational Therapy Session Note  Patient Details  Name: NYLES ALDRIDGE MRN: [id number] Date of Birth: 1980/05/06  Today's Date: 06/03/2018 OT Individual Time: 1345-1425 OT Individual Time Calculation (min): 40 min    Short Term Goals: Week 1:  OT Short Term Goal 1 (Week 1): Pt will be independent with set up for meals OT Short Term Goal 2 (Week 1): Pt will be Supervision for transfer to toilet OT Short Term Goal 3 (Week 1): Pt will be min assist with tub/shower transfer OT Short Term Goal 4 (Week 1): Pt will be supervision with bathing OT Short Term Goal 5 (Week 1): Pt will be supervision with dressing  Skilled Therapeutic Interventions/Progress Updates:    OT intervention with focus on discharge planning, equipment needs, and BUE therex to increase upper body strengthening and increase independence with BADLs. Pt resting in w/c upon arrival with belt alarm activated.  Pt c/o of L jaw pain which intensified during session.  Pt distracted by pain but agreeable to interacting with therapist.  Orlene Erm somewhat tangential.  Unable to accurately assess equipment needs.  Pt educated on BUE therex with green theraband.  Pt independently return demonstrated exercises.  Pt remained in w/c with belt alarm activated.   Therapy Documentation Precautions:  Precautions Precautions: Fall Precaution Comments: droplet Restrictions Weight Bearing Restrictions: No   Pain: Pt c/o 8/10 pain on L side of face/jaw; pt grimacing throughout therapy; RN Tonia admin meds  Therapy/Group: Individual Therapy  Rich Brave 06/03/2018, 2:35 PM

## 2018-06-03 NOTE — Progress Notes (Signed)
Social Work  Social Work Assessment and Plan  Patient Details  Name: [name] MRN: [id number] Date of Birth: July 22, 1980  Today's Date: 06/03/2018  Problem List:  Patient Active Problem List   Diagnosis Date Noted  . Muscle spasm 11/17/2017  . Homicidal ideation 11/17/2017  . Dysesthesia   . Muscle spasms of neck   . Neuropathic pain   . Vitamin B12 deficiency   . Hypoalbuminemia   . Urinary frequency   . Tobacco abuse   . Steroid-induced hyperglycemia   . Marijuana abuse, continuous 04/12/2017  . Vitamin D deficiency 04/12/2017  . Multiple sclerosis exacerbation (HCC)   . Abnormality of gait 05/10/2016  . Weakness   . Hypertension   . Depression   . Multiple sclerosis (HCC) 03/02/2011   Past Medical History:  Past Medical History:  Diagnosis Date  . Depression   . Diabetes mellitus without complication (HCC)   . Eye abnormalities    unspecified by pt  . Hypertension   . Multiple sclerosis (HCC)   . Weakness    generalized   Past Surgical History:  Past Surgical History:  Procedure Laterality Date  . WISDOM TOOTH EXTRACTION     Social History:  reports that he has been smoking cigarettes. He has been smoking about 1.00 pack per day. He has never used smokeless tobacco. He reports current alcohol use of about 12.0 standard drinks of alcohol per week. He reports current drug use. Frequency: 30.00 times per week. Drug: Marijuana.  Family / Support Systems Marital Status: Married How Long?: pt notes he is legally married, however, describes himself as "separated...not sure what we're doing".   Spouse/Significant Other: wife, Chanda Busing @ [phone] Children: 5 yo daughter, Magda Paganini, and 15 y.o son, Doristine Church living close by with their mother. Other Supports: pt notes there are several family members living "right around me...over at my place all the time."   Anticipated Caregiver: self and 52 yo daughter Ability/Limitations of Caregiver: Mom goes to HD; 54  yo daughter is over a lot; has other friends/family to assist Caregiver Availability: Intermittent Family Dynamics: Pt extremely vague about his support system and questions, "why do you need to know?"  Unable to confirm level of support, however, pt describes his plans for being alone in the home  Social History Preferred language: English Religion: None Cultural Background: NA Education: HS Read: Yes Write: Yes Employment Status: Disabled Date Retired/Disabled/Unemployed: 2012 Legal History/Current Legal Issues: None Guardian/Conservator: None - per MD, pt is not fully capable of making decisions on his own behalf.  Legally, pt's NOK would be his spouse as they are still married.   Abuse/Neglect Abuse/Neglect Assessment Can Be Completed: Yes Physical Abuse: Denies Verbal Abuse: Denies Sexual Abuse: Denies Exploitation of patient/patient's resources: Denies Self-Neglect: Denies  Emotional Status Pt's affect, behavior and adjustment status: As noted, pt vague with reports on his living situation, discharge plans, etc.  I am very familiar with this pt as I was his assigned LCSW during prior CIR stay in Dec 2018 and he presents in the same, vague, non-committal manner as he did then.  Pt very tangential with his language and requires frequent redirection in order to complete assessment. Feel will benefit from neuropsychology while here. Recent Psychosocial Issues: None per pt Psychiatric History: None per pt Substance Abuse History: None per pt  Patient / Family Perceptions, Expectations & Goals Pt/Family understanding of illness & functional limitations: Pt reports that he is "where I need to be cuz y'all  know your stuff.  I need my steroids." Premorbid pt/family roles/activities: Pt describes himself as independent at home, however, also notes that he "falls all the time." Anticipated changes in roles/activities/participation: little change anticipated as pt would not agree to  recommended increase in support. Pt/family expectations/goals: "I need the steroids."  [employer]: None Premorbid Home Care/DME Agencies: Other (Comment)([facility] followed after prior CIR dc) Transportation available at discharge: yes per pt Resource referrals recommended: Neuropsychology, Support group (specify)  Discharge Planning Living Arrangements: Alone Support Systems: Parent, Children, Other relatives Type of Residence: Private residence Insurance Resources: Medicare(UHC Medicare) Financial Resources: SSD Financial Screen Referred: No Living Expenses: Lives with family Money Management: Patient Does the patient have any problems obtaining your medications?: No Home Management: pt Patient/Family Preliminary Plans: Pt intends to return to his home alone [place] Barriers to Discharge: Decreased caregiver support Social Work Anticipated Follow Up Needs: HH/OP Expected length of stay: 7-10 days  Clinical Impression Unfortunate gentleman who returns to CIR (here 03/2017) following flu and MS flare.  Continues to have very strong opinions about what he needs to do to manage his MS and is open about his awareness of safety risks he takes when up without assistive devices and notes he "falls everyday."  Doubtful that his stay here will change his attitude about how to be safer in the home, but I guess we can try.  Infant Zink 06/03/2018, 12:03 PM

## 2018-06-03 NOTE — IPOC Note (Signed)
Overall Plan of Care [facility]) Patient Details Name: [name] MRN: [id number] DOB: [birth year]  Admitting Diagnosis: <principal problem not specified>  Hospital Problems: Active Problems:   Multiple sclerosis exacerbation (HCC)     Functional Problem List: Nursing Pain, Safety, Endurance, Medication Management, Motor  PT Balance, Behavior, Endurance, Motor, Pain, Perception, Safety, Sensory, Skin Integrity  OT Balance, Endurance, Motor, Safety, Sensory  SLP    TR         Basic ADL's: OT Grooming, Bathing, Dressing, Toileting     Advanced  ADL's: OT       Transfers: PT Bed Mobility, Bed to Chair, Car, [employer], Floor  OT Tub/Shower     Locomotion: PT Ambulation, Psychologist, prison and probation services, Stairs     Additional Impairments: OT    SLP        TR      Anticipated Outcomes Item Anticipated Outcome  Self Feeding independent  Swallowing      Basic self-care  supervision  Toileting  supervision   Bathroom Transfers supervision with toilet/ and shower  Bowel/Bladder  Pt will manage bowel and bladder with mod I assist at discharge.   Transfers  supervision assist   Locomotion  ambulatory with min assist with LRAD. supervision assist WC mobility    Communication     Cognition     Pain  Pt will manage pain at 4 or less on a scale of 0-10.   Safety/Judgment  Pt will follow safety plan with min assist while in rehab.    Therapy Plan: PT Intensity: Minimum of 1-2 x/day ,45 to 90 minutes PT Frequency: 5 out of 7 days PT Duration Estimated Length of Stay: 9-12 days  OT Intensity: Minimum of 1-2 x/day, 45 to 90 minutes OT Frequency: 5 out of 7 days OT Duration/Estimated Length of Stay: 12-14 days SLP Duration/Estimated Length of Stay: N/A    Team Interventions: Nursing Interventions Patient/Family Education, Pain Management, Disease Management/Prevention, Skin Care/Wound Management, Discharge Planning, Medication Management  PT interventions Ambulation/gait  training, Cognitive remediation/compensation, Discharge planning, DME/adaptive equipment instruction, Functional mobility training, Pain management, Psychosocial support, Splinting/orthotics, Therapeutic Activities, UE/LE Strength taining/ROM, Visual/perceptual remediation/compensation, Wheelchair propulsion/positioning, UE/LE Coordination activities, Stair training, Skin care/wound management, Therapeutic Exercise, Patient/family education, Neuromuscular re-education, Functional electrical stimulation, Disease management/prevention, Firefighter, Warden/ranger  OT Interventions Warden/ranger, Self Care/advanced ADL retraining, Therapeutic Exercise, UE/LE Strength taining/ROM, Pain management, Patient/family education, UE/LE Coordination activities, Functional mobility training, Therapeutic Activities, Discharge planning, DME/adaptive equipment instruction, Neuromuscular re-education  SLP Interventions    TR Interventions    SW/CM Interventions Discharge Planning, Psychosocial Support, Patient/Family Education   Barriers to Discharge MD  Medical stability  Nursing Medical stability    PT Inaccessible home environment, Decreased caregiver support, Medical stability, Home environment access/layout, Lack of/limited family support, Medication compliance, Behavior    OT      SLP      SW       Team Discharge Planning: Destination: PT-Home ,OT- Home , SLP-Home Projected Follow-up: PT-Home health PT, OT-  Home health OT, SLP-None Projected Equipment Needs: PT-Rolling walker with 5" wheels, Wheelchair cushion (measurements), Wheelchair (measurements), OT-  , SLP-None recommended by SLP Equipment Details: PT- , OT-Pt has shower seat Patient/family involved in discharge planning: PT- Patient,  OT-Patient, SLP-Patient  MD ELOS: 10-13 days Medical Rehab Prognosis:  Excellent Assessment: The patient has been admitted for CIR therapies with the diagnosis of MS  exacerbation. The team will be addressing functional mobility, strength, stamina, balance, safety, adaptive techniques and  equipment, self-care, bowel and bladder mgt, patient and caregiver education, NMR, cognition, communication, community reentry. Goals have been set at supervision for self-care, transfers, w/c mobility and min assist for walker .    Ranelle OysterZachary T. Swartz, MD, FAAPMR      See Team Conference Notes for weekly updates to the plan of care

## 2018-06-03 NOTE — Progress Notes (Signed)
Trish Mage, RN  Rehab Admission Coordinator  Physical Medicine and Rehabilitation  PMR Pre-admission  Signed  Date of Service:  05/31/2018 3:49 PM       Related encounter: ED to Hosp-Admission (Discharged) from 05/28/2018 in [facility]      Signed         Show:Clear all [x] Manual[x] Template[x] Copied  Added by: [x] Koleen Distance Ellouise Newer, RN  [] Hover for details PMR Admission Coordinator Pre-Admission Assessment  Patient: [name] is an 38 y.o., male MRN: [id number] DOB: Apr 25, 1981 Height: 5\' 10"  (177.8 cm) Weight: 78.1 kg                                                                                                                                                  Insurance Information HMO:      PPO: [id number]     PCP:       IPA:       80/20:       OTHER:   PRIMARY: A[employer]na Medicare      Policy#: [id number]      Subscriber: patient CM Name: Billey Gosling      Phone#: [phone]     Fax#: [phone] Pre-Cert#: [id number] from 05/31/18 to 06/06/18 with update due on 06/06/18      Employer: [employer] Benefits:  Phone #: [phone]     Name: Online Eff. Date: 05/01/18     Deduct: $0      Out of Pocket Max: $4200      Life Max: N/A CIR: $250 days 1-6 max $1500 per admission      SNF: $0 days 1-20; $178 days 21-100 Outpatient: medical necessity     Co-Pay: $35/visit Home Health: 100%      Co-Pay: none DME: 80%     Co-Pay: 20% Providers: in network  Medicaid Application Date:      Case Manager:   Disability Application Date:       Case Worker:   Emergency Chief Operating Officer Information    Name Relation Home Work Mobile   [place] Spouse [phone]  [phone]   Tahari, [place] Daughter   [phone]     Current Medical History  Patient Admitting Diagnosis:  MS exacerbation; Influenza  History of Present Illness: A 37 y.o.right handedmalewith history of hypertension, diabetes mellitus, multiple  sclerosisdiagnosed on Tysabri andfollowed by neurology services Dr.Yan with poor compliance and most recent admission for MS exacerbation and received inpatient rehabilitation services 04/17/2017 04/25/2017. Per chart review patient lives with his mother. Reportedly independent prior to admission. One level home with 3 steps to entry.Mother does receive hemodialysis.Presented 05/28/2018 with increasing right sided weakness and multiple falls. MRI of the brain findings consistent with advanced multiple sclerosis relatively stable in appearance as compared to most recent MRI from 11/12/2017. No evidence for  significant disease progression or active demyelination. Neurology consulted placed on intravenous Solu-Medrol for MS exacerbation. Influenza panel positive and placed on Tamiflu. Therapy evaluations completed with recommendations of physical medicine rehabilitation consult.    Complete NIHSS TOTAL: 3  Past Medical History      Past Medical History:  Diagnosis Date  . Depression   . Diabetes mellitus without complication (HCC)   . Eye abnormalities    unspecified by pt  . Hypertension   . Multiple sclerosis (HCC)   . Weakness    generalized    Family History  family history includes Heart disease in his mother; Multiple sclerosis in his cousin; Other in his father. He was adopted.  Prior Rehab/Hospitalizations: Has been to CIR previously and was getting HH therapies in the past year.  Has the patient had major surgery during 100 days prior to admission? No  Current Medications   Current Facility-Administered Medications:  .  0.9 %  sodium chloride infusion, 250 mL, Intravenous, PRN, Tarry Kosavid, Rachal A, MD .  acetaminophen (TYLENOL) tablet 650 mg, 650 mg, Oral, Q6H PRN, Opyd, Lavone Neriimothy S, MD, 650 mg at 05/29/18 0034 .  baclofen (LIORESAL) tablet 5 mg, 5 mg, Oral, BID, Aroor, Dara LordsSushanth R, MD, 5 mg at 05/31/18 0928 .  guaiFENesin (ROBITUSSIN) 100 MG/5ML  solution 200 mg, 10 mL, Oral, Q4H PRN, Zigmund DanielPowell, A Caldwell Jr., MD, 200 mg at 05/31/18 0600 .  ibuprofen (ADVIL,MOTRIN) tablet 600 mg, 600 mg, Oral, Q6H PRN, Opyd, Lavone Neriimothy S, MD, 600 mg at 05/29/18 0251 .  ipratropium-albuterol (DUONEB) 0.5-2.5 (3) MG/3ML nebulizer solution 3 mL, 3 mL, Nebulization, Q6H PRN, Zigmund DanielPowell, A Caldwell Jr., MD .  oseltamivir (TAMIFLU) capsule 75 mg, 75 mg, Oral, BID, Haydee Monicaavid, Rachal A, MD, 75 mg at 05/31/18 [id number] .  pantoprazole (PROTONIX) EC tablet 40 mg, 40 mg, Oral, Daily, Aroor, Dara LordsSushanth R, MD, 40 mg at 05/31/18 0929 .  sodium chloride flush (NS) 0.9 % injection 3 mL, 3 mL, Intravenous, Q12H, Tarry Kosavid, Rachal A, MD, 3 mL at 05/31/18 0933 .  sodium chloride flush (NS) 0.9 % injection 3 mL, 3 mL, Intravenous, PRN, Haydee Monicaavid, Rachal A, MD  Patients Current Diet:     Diet Order                  Diet Heart Room service appropriate? Yes; Fluid consistency: Thin  Diet effective now               Precautions / Restrictions Precautions Precautions: Fall Restrictions Weight Bearing Restrictions: No   Has the patient had 2 or more falls or a fall with injury in the past year?Yes.  Patient reports multiple falls this past year.  Prior Activity Level Community (5-7x/wk): Went out at least 4 X a week.  Home Assistive Devices / Equipment Home Assistive Devices/Equipment: None Home Equipment: None  Prior Device Use: Indicate devices/aids used by the patient prior to current illness, exacerbation or injury? None  Prior Functional Level Prior Function Level of Independence: Independent  Self Care: Did the patient need help bathing, dressing, using the toilet or eating?  Independent  Indoor Mobility: Did the patient need assistance with walking from room to room (with or without device)? Independent  Stairs: Did the patient need assistance with internal or external stairs (with or without device)? Independent  Functional Cognition: Did the patient need  help planning regular tasks such as shopping or remembering to take medications? Independent  Current Functional Level Cognition  Overall Cognitive Status: Impaired/Different from baseline  Orientation Level: Oriented X4 Safety/Judgement: Decreased awareness of deficits, Decreased awareness of safety    Extremity Assessment (includes Sensation/Coordination)  Upper Extremity Assessment: Overall WFL for tasks assessed  Lower Extremity Assessment: RLE deficits/detail RLE Deficits / Details: pt with 2/5 strength with hip flexion, 3/5 for knee flexion/extension    ADLs       Mobility  Overal bed mobility: Needs Assistance Bed Mobility: Supine to Sit Supine to sit: Min guard General bed mobility comments: Pt seated edge of bed on arrival    Transfers  Overall transfer level: Needs assistance Equipment used: None, Crutches(crutch on L side only) Transfers: Sit to/from Stand Sit to Stand: Min assist General transfer comment: assist for stability with transition into standing from EOB    Ambulation / Gait / Stairs / Wheelchair Mobility  Ambulation/Gait Ambulation/Gait assistance: Mod assist Gait Distance (Feet): 100 Feet Assistive device: Crutches(Crutch on L side.  ) Gait Pattern/deviations: Step-through pattern, Decreased step length - left, Decreased step length - right, Decreased stride length, Wide base of support General Gait Details: Pt with Multiple LOB and R circumduction.  Pt required assistance to maintain balance.  He did require HHA on R and use of crutch in the L side.  Pt refusing to use RW for support.   Gait velocity: decreased    Posture / Balance Balance Overall balance assessment: Needs assistance Sitting-balance support: Feet supported Sitting balance-Leahy Scale: Good Standing balance support: During functional activity, Single extremity supported, Bilateral upper extremity supported Standing balance-Leahy Scale: Poor    Special needs/care  consideration BiPAP/CPAP No CPM No Continuous Drip IV No Dialysis No         Life Vest No Oxygen No Special Bed No Trach Size No Wound Vac (area) No     Skin: Reports a skin rash                           Bowel mgmt: Last BM 05/30/18 Bladder mgmt: Voiding in urinal WDL Diabetic mgmt No Droplet Precautions: Yes, mask required    Previous Home Environment Living Arrangements: Parent Available Help at Discharge: Friend(s), Family, Available 24 hours/day Type of Home: House Home Layout: One level Home Access: Stairs to enter Entrance Stairs-Rails: Right, Left, Can reach both Entrance Stairs-Number of Steps: 3 Bathroom Shower/Tub: Tub/shower unit Home Care Services: No  Discharge Living Setting Plans for Discharge Living Setting: House, Lives with (comment)(Lives with his mom.) Type of Home at Discharge: House Discharge Home Layout: One level Discharge Home Access: Stairs to enter Secretary/administrator of Steps: 3 Discharge Bathroom Shower/Tub: Tub/shower unit, Curtain Discharge Bathroom Toilet: Standard Discharge Bathroom Accessibility: Yes How Accessible: Accessible via walker Does the patient have any problems obtaining your medications?: No  Social/Family/Support Systems Patient Roles: Spouse, Parent, Other (Comment)(Separated from his wife, has 2 children, and a mom.) Contact Information: Chanda Busing - spouse - [phone] Anticipated Caregiver: self and 17 yo daughter Ability/Limitations of Caregiver: Mom goes to HD and mom works; 76 yo child is over a lot; has other friends/family to assist Caregiver Availability: Intermittent Discharge Plan Discussed with Primary Caregiver: Yes Is Caregiver In Agreement with Plan?: Yes Does Caregiver/Family have Issues with Lodging/Transportation while Pt is in Rehab?: No  Goals/Additional Needs Patient/Family Goal for Rehab: PT/OT/SLP mod I goals Expected length of stay: 7-10 days Cultural Considerations:  None Dietary Needs: Regular diet, thin liquids Equipment Needs: TBD Pt/Family Agrees to Admission and willing to participate: Yes Program Orientation Provided & Reviewed  with Pt/Caregiver Including Roles  & Responsibilities: Yes  Decrease burden of Care through IP rehab admission: N/A  Possible need for SNF placement upon discharge: Not planned  Patient Condition: This patient's condition remains as documented in the consult dated 05/30/18, in which the Rehabilitation Physician determined and documented that the patient's condition is appropriate for intensive rehabilitative care in an inpatient rehabilitation facility. Will admit to inpatient rehab today.  Preadmission Screen Completed By:  Trish Mage, 05/31/2018 4:05 PM ______________________________________________________________________   Discussed status with Dr. Riley Kill on 05/31/18 at 1603 and received telephone approval for admission today.  Admission Coordinator:  Trish Mage, time 1603/Date 05/31/18           Cosigned by: Ranelle Oyster, MD at 05/31/2018 4:10 PM  Revision History

## 2018-06-04 ENCOUNTER — Inpatient Hospital Stay (HOSPITAL_COMMUNITY): Payer: Medicare HMO | Admitting: Occupational Therapy

## 2018-06-04 ENCOUNTER — Inpatient Hospital Stay (HOSPITAL_COMMUNITY): Payer: Medicare HMO

## 2018-06-04 ENCOUNTER — Inpatient Hospital Stay (HOSPITAL_COMMUNITY): Payer: Medicare HMO | Admitting: Physical Therapy

## 2018-06-04 NOTE — Plan of Care (Signed)
  Problem: Consults Goal: RH GENERAL PATIENT EDUCATION Description See Patient Education module for education specifics. Outcome: Progressing   Problem: RH SKIN INTEGRITY Goal: RH STG SKIN FREE OF INFECTION/BREAKDOWN Description No new breakdown with mod I  assist   Outcome: Progressing   Problem: RH SAFETY Goal: RH STG ADHERE TO SAFETY PRECAUTIONS W/ASSISTANCE/DEVICE Description STG Adhere to Safety Precautions With Min Assistance/Device.  Outcome: Progressing   Problem: RH PAIN MANAGEMENT Goal: RH STG PAIN MANAGED AT OR BELOW PT'S PAIN GOAL Description < 3 out of 10.   Outcome: Progressing   

## 2018-06-04 NOTE — Progress Notes (Signed)
Occupational Therapy Session Note  Patient Details  Name: [name] MRN: [id number] Date of Birth: 08-18-1980  Today's Date: 06/04/2018 OT Individual Time: 8768-1157 and 1530-1600 OT Individual Time Calculation (min): 68 min and 30 min    Short Term Goals: Week 1:  OT Short Term Goal 1 (Week 1): Pt will be independent with set up for meals OT Short Term Goal 2 (Week 1): Pt will be Supervision for transfer to toilet OT Short Term Goal 3 (Week 1): Pt will be min assist with tub/shower transfer OT Short Term Goal 4 (Week 1): Pt will be supervision with bathing OT Short Term Goal 5 (Week 1): Pt will be supervision with dressing  Skilled Therapeutic Interventions/Progress Updates:    Session 1:Upon entering the room, pt supine in bed with no c/o pain. Pt refusing to utilize AD and observed to be furniture walking in room with min guard for safety. Pt not wanting therapist to touch/assist him. Pt requesting to shower this session but again very verbal about therapist not "watching" him or assisting him. OT providing intermittent supervision and verbal cuing for safety while pt bathing and then transferring onto commode chair to don clothing with set up A. Pt exiting to standing at sink for grooming tasks with supervision - min guard for safety. Pt requesting to go to [employer] to get coffee and propelled self multiple bouts of 100' with assistance as needed secondary to fatigue to coffee shop. Pt able to proper wheelchair short distances with food items safely placed in wheelchair with him. Pt returning to room with chair alarm donned and call bell within reach.   Session 2: Upon entering the room, pt seated in wheelchair and agreeable to OT intervention. OT attempting to review HEP for UEs but pt needing mod cuing to redirect. Pt then verbalized, " I don't really think therapy is doing anything for me. I have been doing this for 3 years." OT also educated pt that over three years his MS has likely  gotten worse with time. Pt verbalized that staff does not understand. Pt verbalized feeling like this was not the place for him anymore. OT discussed with social worker at end of session. Pt remained in wheelchair with chair alarm donned and all needs within reach.   Therapy Documentation Precautions:  Precautions Precautions: Fall Precaution Comments: droplet Restrictions Weight Bearing Restrictions: No Vital Signs: Therapy Vitals Temp: 97.9 F (36.6 C) Temp Source: Oral Resp: 12 BP: 134/87 Patient Position (if appropriate): Lying Oxygen Therapy SpO2: 100 % ADL: ADL Eating: Independent Where Assessed-Eating: Bed level Grooming: Minimal assistance Where Assessed-Grooming: Standing at sink Upper Body Bathing: Minimal assistance Where Assessed-Upper Body Bathing: Shower Lower Body Bathing: Minimal assistance Where Assessed-Lower Body Bathing: Shower Upper Body Dressing: Minimal assistance Where Assessed-Upper Body Dressing: Sitting at sink Lower Body Dressing: Minimal assistance Where Assessed-Lower Body Dressing: Sitting at sink Toileting: Minimal assistance Where Assessed-Toileting: Teacher, adult education: Curator Method: Surveyor, minerals: Acupuncturist: Moderate assistance Film/video editor Method: Warden/ranger: Emergency planning/management officer   Therapy/Group: Individual Therapy  Alen Bleacher 06/04/2018, 9:38 AM

## 2018-06-04 NOTE — Progress Notes (Signed)
Physical Therapy Session Note  Patient Details  Name: [name] MRN: [id number] Date of Birth: 02-07-1981  Today's Date: 06/04/2018 PT Individual Time: 1000-1045 PT Individual Time Calculation (min): 45 min   Short Term Goals: Week 1:  PT Short Term Goal 1 (Week 1): Pt will performed stand pivot transfer with CGA consistently  PT Short Term Goal 2 (Week 1): Pt will propell WC >259ft with set up assist only  PT Short Term Goal 3 (Week 1): Pt will ambulate 51ft with mod assist and LRAD  PT Short Term Goal 4 (Week 1): Pt will ascend/descend 4   Skilled Therapeutic Interventions/Progress Updates:    Pt received seated in w/c in room, agreeable to PT session. Sit to stand with CGA. Ambulation x 180 ft with min to mod A with use of rail in hallway. Pt attempts to perform gait without UE support and exhibits increased ataxia and decreased dynamic standing balance. Pt is resistant to cues and education about why he may benefit from use of UE support for increased independence and safety with mobility. Per pt report he utilized a 2nd person when ambulating prior to admission or he was "furniture-walking" and was having multiple falls every day at home. Sidesteps x 30 ft L/R with BUE support and min A, focus on B hip strengthening. Standing in // bars alt L/R colored dot taps for focus on LE coordination and hip control with BUE support. Standing forward/backward steps with alt LE, no UE support and min A for balance. Pt exhibits improved control and awareness of trunk when gait broken down in // bars. Pt left seated in w/c in room with needs in reach, quick release belt in place.  Therapy Documentation Precautions:  Precautions Precautions: Fall Precaution Comments: droplet Restrictions Weight Bearing Restrictions: No Pain: Pain Assessment Pain Scale: 0-10 Pain Score: 0-No pain    Therapy/Group: Individual Therapy   Peter Congo, PT, DPT  06/04/2018, 12:29 PM

## 2018-06-04 NOTE — Plan of Care (Signed)
  Problem: Consults Goal: RH GENERAL PATIENT EDUCATION Description See Patient Education module for education specifics. Outcome: Progressing   Problem: RH SKIN INTEGRITY Goal: RH STG SKIN FREE OF INFECTION/BREAKDOWN Description No new breakdown with mod I  assist   Outcome: Progressing   Problem: RH SAFETY Goal: RH STG ADHERE TO SAFETY PRECAUTIONS W/ASSISTANCE/DEVICE Description STG Adhere to Safety Precautions With Min Assistance/Device.  Outcome: Progressing   Problem: RH PAIN MANAGEMENT Goal: RH STG PAIN MANAGED AT OR BELOW PT'S PAIN GOAL Description < 3 out of 10.   Outcome: Progressing

## 2018-06-04 NOTE — Progress Notes (Signed)
[place] PHYSICAL MEDICINE & REHABILITATION PROGRESS NOTE   Subjective/Complaints:  No new complaints. Ready for therapy. I asked him if he had discussed goals with therapy and he said "we're all good and together". I asked him about using the wheelchair, and he said "if I don't use my legs, I'll lose them!"  ROS: Patient denies fever, rash, sore throat, blurred vision, nausea, vomiting, diarrhea, cough, shortness of breath or chest pain, joint or back pain, headache, or mood change.    Objective:   No results found. No results for input(s): WBC, HGB, HCT, PLT in the last 72 hours. No results for input(s): NA, K, CL, CO2, GLUCOSE, BUN, CREATININE, CALCIUM in the last 72 hours.  Intake/Output Summary (Last 24 hours) at 06/04/2018 0858 Last data filed at 06/04/2018 0605 Gross per 24 hour  Intake 480 ml  Output 950 ml  Net -470 ml     Physical Exam: Vital Signs Blood pressure 134/87, pulse 69, temperature 97.9 F (36.6 C), temperature source Oral, resp. rate 12, height 5\' 10"  (1.778 m), weight 78 kg, SpO2 100 %.  Constitutional: No distress . Vital signs reviewed. HEENT: EOMI, oral membranes moist Neck: supple Cardiovascular: RRR without murmur. No JVD    Respiratory: CTA Bilaterally without wheezes or rales. Normal effort    GI: BS +, non-tender, non-distended  Skin: No evidence of breakdown, no evidence of rash Neurologic: Cranial nerves II through XII intact, motor strength is 5/5 in left and 4/5 in right deltoid, bicep, tricep, grip, hip flexor, knee extensors, ankle dorsiflexor and plantar flexor Ongoing limb ataxia, inconsistent sensory exam.  Musculoskeletal: Full range of motion in all 4 extremities. No joint swelling Psyc: labile but pleasant   Assessment/Plan: 1. Functional deficits secondary to MS pseudo-exacerbation with increased ataxia and balance issues which require 3+ hours per day of interdisciplinary therapy in a comprehensive inpatient rehab  setting.  Physiatrist is providing close team supervision and 24 hour management of active medical problems listed below.  Physiatrist and rehab team continue to assess barriers to discharge/monitor patient progress toward functional and medical goals  Care Tool:  Bathing    Body parts bathed by patient: Right arm, Left arm, Chest, Abdomen, Front perineal area, Buttocks, Right upper leg, Left upper leg, Right lower leg, Left lower leg, Face   Body parts bathed by helper: Right lower leg, Left lower leg     Bathing assist Assist Level: Set up assist     Upper Body Dressing/Undressing Upper body dressing   What is the patient wearing?: Pull over shirt    Upper body assist Assist Level: Independent    Lower Body Dressing/Undressing Lower body dressing      What is the patient wearing?: Pants     Lower body assist Assist for lower body dressing: Independent     Toileting Toileting    Toileting assist Assist for toileting: Independent Assistive Device Comment: urinal   Transfers Chair/bed transfer  Transfers assist     Chair/bed transfer assist level: Supervision/Verbal cueing     Locomotion Ambulation   Ambulation assist      Assist level: Moderate Assistance - Patient 50 - 74% Assistive device: Hand held assist Max distance: 170 ft   Walk 10 feet activity   Assist     Assist level: Moderate Assistance - Patient - 50 - 74% Assistive device: Hand held assist   Walk 50 feet activity   Assist Walk 50 feet with 2 turns activity did not occur: Safety/medical concerns  Assist level: Moderate Assistance - Patient - 50 - 74% Assistive device: Hand held assist    Walk 150 feet activity   Assist Walk 150 feet activity did not occur: Safety/medical concerns  Assist level: Moderate Assistance - Patient - 50 - 74% Assistive device: Hand held assist    Walk 10 feet on uneven surface  activity   Assist Walk 10 feet on uneven surfaces activity  did not occur: Safety/medical concerns         Wheelchair     Assist   Type of Wheelchair: Manual    Wheelchair assist level: Supervision/Verbal cueing Max wheelchair distance: 264ft     Wheelchair 50 feet with 2 turns activity    Assist        Assist Level: Supervision/Verbal cueing   Wheelchair 150 feet activity     Assist     Assist Level: Supervision/Verbal cueing    Medical Problem List and Plan: 1.MS pseudoexacerbationsecondary to Influenza, has a chronic mild right hemiparesis  -Continue CIR therapies including PT, OT   -grounds pass 2. DVT Prophylaxis/Anticoagulation: Pharmaceutical:Lovenox 3. Pain Management:tylenol prn 4. Mood:LCSW to follow for evaluation and support. 5. Neuropsych: This patientis not fullycapable of making decisions onhisown behalf. 6. Skin/Wound Care:Routine pressure relief measures. 7. Fluids/Electrolytes/Nutrition:Monitor I/O. Check lytes in am.  8. Flu: completed 5 d tamiflu. Continue respiratory precautions. 9. BWG:[id number].  10. Leucocytosis:  resolved 11.Asymptomatic bradycardia: Intermittent lows with HR in 50's. Monitor forsymptoms.   Vitals:   06/03/18 1927 06/04/18 0600  BP: 117/78 134/87  Pulse: 69   Resp: 12 12  Temp: 97.7 F (36.5 C) 97.9 F (36.6 C)  SpO2: 100% 100%  stable LOS:   4 days A FACE TO FACE EVALUATION WAS PERFORMED  Ranelle Oyster 06/04/2018, 8:58 AM

## 2018-06-04 NOTE — Progress Notes (Signed)
Called [name] in Infection Control, [phone] left message to call me back to confirm that okay to remove contact at this time.

## 2018-06-04 NOTE — Progress Notes (Signed)
Called Infection control, talked to Tammy and recv'd verbal ok to remove pt from droplet if has been 7 days sn test + for flu and has been symptom free for 24 hours. Removed droplet precautions

## 2018-06-04 NOTE — Patient Care Conference (Signed)
Inpatient RehabilitationTeam Conference and Plan of Care Update Date: 06/04/2018   Time: 2:15 PM    Patient Name: [name]      Medical Record Number: [id number]  Date of Birth: January 03, 1981 Sex: Male         Room/Bed: 4W06C/4W06C-01 Payor Info: Payor: [employer] MEDICARE / Plan: [employer] MEDICARE HMO/PPO / Product Type: *No Product type* /    Admitting Diagnosis: MS  Admit Date/Time:  05/31/2018  6:36 PM Admission Comments: No comment available   Primary Diagnosis:  <principal problem not specified> Principal Problem: <principal problem not specified>  Patient Active Problem List   Diagnosis Date Noted  . Muscle spasm 11/17/2017  . Homicidal ideation 11/17/2017  . Dysesthesia   . Muscle spasms of neck   . Neuropathic pain   . Vitamin B12 deficiency   . Hypoalbuminemia   . Urinary frequency   . Tobacco abuse   . Steroid-induced hyperglycemia   . Marijuana abuse, continuous 04/12/2017  . Vitamin D deficiency 04/12/2017  . Multiple sclerosis exacerbation (HCC)   . Abnormality of gait 05/10/2016  . Weakness   . Hypertension   . Depression   . Multiple sclerosis (HCC) 03/02/2011    Expected Discharge Date: Expected Discharge Date: (TBD)  Team Members Present: Physician leading conference: Dr. Faith Rogue Social Worker Present: Amada Jupiter, LCSW Nurse Present: Tennis Must, RN PT Present: Other (comment)(Taylor Serita Grit, PT) OT Present: Jackquline Denmark, OT SLP Present: Colin Benton, SLP PPS Coordinator present : Fae Pippin     Current Status/Progress Goal Weekly Team Focus  Medical   Patient admitted with MS exacerbation.  Poor safety awareness and insight.  Ongoing education regarding safety and fall risk  Fluid and electrolyte management, symptom management   Bowel/Bladder   Pt is continent of bowel and bladder, LBM-1/30-declining all laxatives, pt reports urinary urgency at times  Remain continent of bowel and bladder, regain regular bowel pattern  Assess and assist  with toileting needs as needed   Swallow/Nutrition/ Hydration             ADL's   supervision - min guard with self care tasks, pt refuses to use AD for ambulation  supervision overall  safety awareness, balance, strengthening, pt/caregiver education   Mobility   CGA to min A transfer, mod A gait up to 180 ft with HHA and rail  S to min A overall  balance training, safety awareness, BLE strengthening   Communication             Safety/Cognition/ Behavioral Observations            Pain   patient occasionally complains of pain, c/o pain to left face this shift. PRN Ibuprofen given  Maintain pain level <=3/10.  Assess pain every shift and as needed   Skin   No skin issues noted.   Maintain skin integrity  Assess skin every shift and as needed    Rehab Goals Patient on target to meet rehab goals: Yes *See Care Plan and progress notes for long and short-term goals.     Barriers to Discharge  Current Status/Progress Possible Resolutions Date Resolved   Physician    Behavior        Continued education by team.  Attempt to convince patient to accept placement at least for short-term      Nursing                  PT  Behavior  resistant to education and poor insight  OT                  SLP                SW Decreased caregiver support              Discharge Planning/Teaching Needs:  Home with family living "close by...they will be there if I need it."  Pt very vague with who will be primary caregiver.  Teaching to be planned closer to d/c.   Team Discussion:  Returning pt who, as with first CIR stay, has very poor insight into his deficits and safety measures that are needed for mobility and ADLs.  Mod assist - supervision with therapies.  Goals set for min assist gait, however, pt cannot confirm support system.  MD requests that we try and confirm 24/7 support and move towards d/c since pt is resistant to following our program.  Revisions to Treatment Plan:  NA     Continued Need for Acute Rehabilitation Level of Care: The patient requires daily medical management by a physician with specialized training in physical medicine and rehabilitation for the following conditions: Daily direction of a multidisciplinary physical rehabilitation program to ensure safe treatment while eliciting the highest outcome that is of practical value to the patient.: Yes Daily medical management of patient stability for increased activity during participation in an intensive rehabilitation regime.: Yes Daily analysis of laboratory values and/or radiology reports with any subsequent need for medication adjustment of medical intervention for : Neurological problems   I attest that I was present, lead the team conference, and concur with the assessment and plan of the team.   Naryah Clenney 06/05/2018, 1:43 PM

## 2018-06-04 NOTE — Progress Notes (Signed)
Pt continues to refuse lovenox and laxative. Pt last BM prior to admission, bowel sounds present, abdomen soft. Pt educated on risks related to non intervention. Pt continues to refuse.

## 2018-06-04 NOTE — Progress Notes (Signed)
Occupational Therapy Session Note  Patient Details  Name: [name] MRN: [id number] Date of Birth: 11/11/1980  Today's Date: 06/04/2018 OT Individual Time: 1100-1156 OT Individual Time Calculation (min): 56 min    Short Term Goals: Week 1:  OT Short Term Goal 1 (Week 1): Pt will be independent with set up for meals OT Short Term Goal 2 (Week 1): Pt will be Supervision for transfer to toilet OT Short Term Goal 3 (Week 1): Pt will be min assist with tub/shower transfer OT Short Term Goal 4 (Week 1): Pt will be supervision with bathing OT Short Term Goal 5 (Week 1): Pt will be supervision with dressing  Skilled Therapeutic Interventions/Progress Updates:    Session focused on B UE strengthening/endurance. Pt completed w/c propulsion 150 ft with (S). Pt transferred to mat in therapy gym with min A. Pt sat unsupported EOM and completed B UE strengthening exercises holding a 4lb dumbbell. Tactile cues provided at the shoulder and scapula to increase proximal stability. Shifted focus of R UE exercises to include wrist stability as well d/t observation of wrist dropping with weight. Pt then completed IADL activity of making coffee bimanually, with min A. Pt returned to room and was left sitting up with all needs met, chair alarm belt set.   Therapy Documentation Precautions:  Precautions Precautions: Fall Precaution Comments: droplet Restrictions Weight Bearing Restrictions: No    Pain: Pain Assessment Pain Scale: 0-10 Pain Score: 0-No pain  Therapy/Group: Individual Therapy  Curtis Sites 06/04/2018, 12:27 PM

## 2018-06-05 ENCOUNTER — Inpatient Hospital Stay (HOSPITAL_COMMUNITY): Payer: Medicare HMO | Admitting: Occupational Therapy

## 2018-06-05 ENCOUNTER — Inpatient Hospital Stay (HOSPITAL_COMMUNITY): Payer: Medicare HMO | Admitting: Physical Therapy

## 2018-06-05 ENCOUNTER — Encounter (HOSPITAL_COMMUNITY): Payer: Medicare HMO | Admitting: Psychology

## 2018-06-05 DIAGNOSIS — R4689 Other symptoms and signs involving appearance and behavior: Secondary | ICD-10-CM

## 2018-06-05 DIAGNOSIS — R4189 Other symptoms and signs involving cognitive functions and awareness: Secondary | ICD-10-CM

## 2018-06-05 LAB — CBC
HCT: 38.2 % — ABNORMAL LOW (ref 39.0–52.0)
Hemoglobin: 12.2 g/dL — ABNORMAL LOW (ref 13.0–17.0)
MCH: 27.6 pg (ref 26.0–34.0)
MCHC: 31.9 g/dL (ref 30.0–36.0)
MCV: 86.4 fL (ref 80.0–100.0)
Platelets: 251 10*3/uL (ref 150–400)
RBC: 4.42 MIL/uL (ref 4.22–5.81)
RDW: 13.2 % (ref 11.5–15.5)
WBC: 4.4 10*3/uL (ref 4.0–10.5)
nRBC: 0 % (ref 0.0–0.2)

## 2018-06-05 LAB — BASIC METABOLIC PANEL
Anion gap: 9 (ref 5–15)
BUN: 13 mg/dL (ref 6–20)
CO2: 27 mmol/L (ref 22–32)
Calcium: 8.9 mg/dL (ref 8.9–10.3)
Chloride: 104 mmol/L (ref 98–111)
Creatinine, Ser: 0.95 mg/dL (ref 0.61–1.24)
GFR calc Af Amer: >60 mL/min (ref >60)
GFR calc non Af Amer: >60 mL/min (ref >60)
Glucose, Bld: 109 mg/dL — ABNORMAL HIGH (ref 70–99)
Potassium: 4.5 mmol/L (ref 3.5–5.1)
Sodium: 140 mmol/L (ref 135–145)

## 2018-06-05 NOTE — Consult Note (Signed)
Neuropsychological Consultation   Patient:   [name]   DOB:   1980/06/03  MR Number:  [id number]  Location:  [facility] [address] CENTER A [address] [id number] [place] [address] Dept: [phone] Loc: [phone]           Date of Service:   06/05/2018  Start Time:   1 PM End Time:   2 PM  Provider/Observer:  Arley Phenix, Psy.D.       Clinical Neuropsychologist       Billing Code/Service: [id number]  Chief Complaint:    [name] is a 38 year old male who has a history of hypertension, type 2 diabetes, multiple sclerosis diagnosed in 2010 treated with tysatbi until approximately 1 year ago.  The patient was admitted on 05/28/2018 with reports of worsening of right-sided weakness, malaise and falls for 48 hours PTA.  MRI showed advanced multiple sclerosis relatively stable without acute demyelinization.  The patient was febrile at admission with a temperature of 101.4 and was positive for flu.  The patient was started on Oseltamivir for treatment.  The patient was also treated with Solu-Medrol for 3 days for pseudo-exacerbation of MS per neurology input.    Reason for Service:  [name] was referred for neuropsychological consultation due to emotional distress and coping issues and ongoing issues with regard to cognition and behavior that are having a deleterious effect on his ability to manage and cope with the rehabilitation inpatient efforts.  [name] is the formal HPI for the current admission.  [name] is a 38 year old male with history of HTN, T2DM, MS diagnosed 2010 ( CIR stay 03/2017) treated with tysarbri till approximately a year ago. He was admitted on 05/28/18 with reports of worsening of right sided weakness, malaiseand falls for 48 hours PTA. MRI brain showed advanced MS--relatively stable without active demyelination. He was febrile at admission with T 101.4 and was positive for H flu  therefore started on Oseltamivir for treatment. He was treated with solumedrol X 3 days for pseudoexacerbation of MS per neurology input. He has defervesced but continues to be limited by weakness, poor safety awareness and cognitive deficits affecting functional status. CIR recommended for follow up therapy.   Current Status:  The patient has continued to be limited by weakness, poor safety awareness and cognitive deficits affecting functional status.  He was recommended for comprehensive inpatient rehabilitation program.  The patient is continued to be quite tangential and confused in his thinking.  The patient is continued to perseverate about a number of issues and concerns he has regarding various medical care he has had the past 10 years or so.  The patient has a lot of perseverative intrusive somewhat paranoid ideation.  The patient was oriented to person place and time as well as situation.  The patient repeated numerous concerns that he has about various physicians and care he has received particularly around MS over the years.  The patient has a lot of frustration associated with interferon treatments in the past.  The patient has not been compliant recently with his neurology care regarding his MS.  The patient was open to and ready to continue physical therapies particularly with his fixation on his right-sided weakness and his vulnerability for falls injury from these falls.  Behavioral Observation: [name]  presents as a 38 y.o.-year-old Right African American Male who appeared his stated age. his dress was Appropriate and he was Well  Groomed and his manners were Appropriate, inappropriate to the situation.  his participation was indicative of Inattentive, Monopolizing, Redirectable and Resistant behaviors.  There were any physical disabilities noted.  he displayed an appropriate level of cooperation and motivation.     Interactions:    Active Inattentive, Monopolizing and  Redirectable  Attention:   abnormal and attention span appeared shorter than expected for age  Memory:   abnormal; remote memory intact, recent memory impaired  Visuo-spatial:  not examined  Speech (Volume):  normal  Speech:   normal; slurred  Thought Process:  Circumstantial, Tangential and Disorganized  Though Content:  Rumination; obsessions  Orientation:   person, place, time/date and situation  Judgment:   Poor  Planning:   Poor  Affect:    Defensive, Irritable and Resistant  Mood:    Anxious  Insight:   Lacking  Intelligence:   normal   Medical History:   Past Medical History:  Diagnosis Date  . Depression   . Diabetes mellitus without complication (HCC)   . Eye abnormalities    unspecified by pt  . Hypertension   . Multiple sclerosis (HCC)   . Weakness    generalized            Abuse/Trauma History: The patient has been dealing for more than 10 years with a diagnosis of MS with significant episodes of exacerbation and difficult treatment course.  Psychiatric History:  The patient has a history of diabetes as well as significant cognitive difficulties that are consistent with the widespread brain involvement of his MS is visualized through MRI.  Family Med/Psych History:  Family History  Adopted: Yes  Problem Relation Age of Onset  . Multiple sclerosis Cousin   . Heart disease Mother   . Other Father        Does not know his father.    Risk of Suicide/Violence: [name] the patient denies any suicidal or homicidal ideation.  Impression/DX:  [name] is a 38 year old male who has a history of hypertension, type 2 diabetes, multiple sclerosis diagnosed in 2010 treated with tysatbi until approximately 1 year ago.  The patient was admitted on 05/28/2018 with reports of worsening of right-sided weakness, malaise and falls for 48 hours PTA.  MRI showed advanced multiple sclerosis relatively stable without acute demyelinization.  The patient was febrile at  admission with a temperature of 101.4 and was positive for flu.  The patient was started on Oseltamivir for treatment.  The patient was also treated with Solu-Medrol for 3 days for pseudo-exacerbation of MS per neurology input.    he patient has continued to be limited by weakness, poor safety awareness and cognitive deficits affecting functional status.  He was recommended for comprehensive inpatient rehabilitation program.  The patient is continued to be quite tangential and confused in his thinking.  The patient is continued to perseverate about a number of issues and concerns he has regarding various medical care he has had the past 10 years or so.  The patient has a lot of perseverative intrusive somewhat paranoid ideation.  The patient was oriented to person place and time as well as situation.  The patient repeated numerous concerns that he has about various physicians and care he has received particularly around MS over the years.  The patient has a lot of frustration associated with interferon treatments in the past.  The patient has not been compliant recently with his neurology care regarding his MS.  The patient was open to and ready  to continue physical therapies particularly with his fixation on his right-sided weakness and his vulnerability for falls injury from these falls.    Disposition/Plan:  The patient clearly has some significant cognitive difficulties that have impacted his motor functioning with regard to speech as well as weakness particularly on the right side.  The patient's symptoms are consistent with widespread brain involvement of his MS with significant lesioning.  It is unclear the degree with which premorbid personality issues play a role in his current standing and tangential thinking and hyperfocus on worries and fears about how he has been treated and cared for.  It will be important that focus be maintained during therapies on the current issues as the patient will likely  try to dominate conversations with past grievances and concerns.  The patient does report that he would like to continue with the physical therapy and sees the benefit of trying to improve his motor functioning.  Diagnosis:    Multiple sclerosis, cognitive and behavioral changes        Electronically Signed   _______________________ Arley Phenix, Psy.D.

## 2018-06-05 NOTE — Progress Notes (Signed)
Occupational Therapy Session Note  Patient Details  Name: [name] MRN: [id number] Date of Birth: 1980-11-27  Today's Date: 06/05/2018 OT Individual Time: 5993-5701 OT Individual Time Calculation (min): 72 min    Short Term Goals: Week 1:  OT Short Term Goal 1 (Week 1): Pt will be independent with set up for meals OT Short Term Goal 2 (Week 1): Pt will be Supervision for transfer to toilet OT Short Term Goal 3 (Week 1): Pt will be min assist with tub/shower transfer OT Short Term Goal 4 (Week 1): Pt will be supervision with bathing OT Short Term Goal 5 (Week 1): Pt will be supervision with dressing  Skilled Therapeutic Interventions/Progress Updates:    Upon entering the room, pt seated in wheelchair with no c/o pain this session. Pt agreeable to OT intervention and requesting to shower. Pt is noted to be seated in wheelchair without chair alarm on and likely pt has turned alarm off himself. OT educated pt on need for chair alarm secondary to safety concerns. Pt continues to refuse assistance from OT, doesn't want therapist in bathroom with him, and refuses use of AD for safety with ambulating. Pt ambulating 10' into bathroom with min guard and "furniture walking". Pt seated on TTB and doffing clothing items with min cuing from therapist for safety. Pt bathing from seated with intermittent supervision and dressing as well from edge of TTB with overall supervision and pt leaning on wall with max cuing for safety awareness throughout session. Pt becoming upset if therapist touches for assist. OT placing chair alarm back on pt and activated alarm once exiting the room. Call bell and all needed items within reach.   Therapy Documentation Precautions:  Precautions Precautions: Fall Precaution Comments: droplet Restrictions Weight Bearing Restrictions: No Vital Signs: Therapy Vitals Pulse Rate: 68 BP: 127/79 Patient Position (if appropriate): Sitting Oxygen Therapy SpO2: 99 % O2  Device: Room Air Pain: Pain Assessment Pain Scale: 0-10 Pain Score: 0-No pain ADL: ADL Eating: Independent Where Assessed-Eating: Bed level Grooming: Minimal assistance Where Assessed-Grooming: Standing at sink Upper Body Bathing: Minimal assistance Where Assessed-Upper Body Bathing: Shower Lower Body Bathing: Minimal assistance Where Assessed-Lower Body Bathing: Shower Upper Body Dressing: Minimal assistance Where Assessed-Upper Body Dressing: Sitting at sink Lower Body Dressing: Minimal assistance Where Assessed-Lower Body Dressing: Sitting at sink Toileting: Minimal assistance Where Assessed-Toileting: Teacher, adult education: Curator Method: Surveyor, minerals: Acupuncturist: Moderate assistance Film/video editor Method: Warden/ranger: Emergency planning/management officer   Therapy/Group: Individual Therapy  Alen Bleacher 06/05/2018, 4:34 PM

## 2018-06-05 NOTE — Progress Notes (Addendum)
Physical Therapy Discharge Summary  Patient Details  Name: [name] MRN: [id number] Date of Birth: 11-26-1980  Today's Date: 06/06/2018   Patient has met 6 of 8 long term goals due to improved activity tolerance, increased strength and ability to compensate for deficits.  Patient to discharge at [place] would be w/c level, however, pt will ambulate in the home though cautioned to have assist as high risk for falls with significant fall history level Supervision for transfers, mod A for ambulation. Patient's care partner unavailable to provide the necessary physical and cognitive assistance at discharge.  Reasons goals not met: Pt shows poor insight into his deficits and consistently refuses education regarding safety and AD use for improved independence with mobility. Pt was having multiple falls daily at home prior to admission and does not see this as a barrier to safe d/c home. Pt has refused the use of any AD including a wheelchair or RW upon d/c home, though he reports having these devices at home.  Recommendation:  Patient will benefit from ongoing skilled PT services in home health setting to continue to advance safe functional mobility, address ongoing impairments in safety, R neuromuscular control, strength, balance and minimize fall risk.  Equipment: No equipment provided. Pt has refused the use of an AD.  Reasons for discharge: discharge from hospital  Patient/family agrees with progress made and goals achieved: Yes  PT Discharge Precautions/Restrictions Precautions Precautions: Fall Pain Pain Assessment Pain Score: 0-No pain Vision/Perception  Perception Perception: Within Functional Limits  Cognition Arousal/Alertness: Awake/alert Orientation Level: Oriented X4 Attention: Selective Problem Solving: Impaired Executive Function: Reasoning Safety/Judgment: Impaired Comments: baseline poor awareness, safety, and problem solving.  Sensation Sensation Light  Touch: Impaired by gross assessment Proprioception: Impaired by gross assessment Additional Comments: denies numbness/tingling currently, but has intermittent issues especially with temperature changes and fatigue Coordination Gross Motor Movements are Fluid and Coordinated: No Coordination and Movement Description: Decresaed R LE compared to L with extensor tone and ataxic movements at times Motor  Motor Motor: Abnormal postural alignment and control;Abnormal tone;Motor impersistence;Ataxia Motor - Discharge Observations: R side weakness, coordination deficits, ataxic movements and extensor tone in LE  Mobility Bed Mobility Supine to Sit: Supervision/Verbal cueing Sit to Supine: Supervision/Verbal cueing Transfers Transfers: Sit to Stand Sit to Stand: Supervision/Verbal cueing Stand to Sit: Supervision/Verbal cueing Stand Pivot Transfers: Supervision/Verbal cueing Transfer (Assistive device): Other (Comment)(holds onto furniture) Locomotion  Gait Ambulation: Yes Gait Assistance: Moderate Assistance - Patient 50-74% Gait Distance (Feet): 60 Feet Assistive device: 1 person hand held assist Gait Assistance Details: L lateral lean and circumduction to clear R foot Gait Gait Pattern: Impaired Gait Pattern: Right circumduction;Scissoring;Lateral trunk lean to left Stairs / Additional Locomotion Stairs: Yes Stairs Assistance: Contact Guard/Touching assist Stair Management Technique: Two rails;Step to pattern;Forwards Number of Stairs: 4 Height of Stairs: 6 Architect: Yes Wheelchair Assistance: Independent with Camera operator: Both upper extremities Wheelchair Parts Management: Independent Distance: 200  Trunk/Postural Assessment  Cervical Assessment Cervical Assessment: Within Scientist, physiological Assessment: Within Functional Limits Postural Control Postural Control: Deficits on  evaluation Righting Reactions: delayed in standing  Protective Responses: delayed in standing   Balance Dynamic Sitting Balance Dynamic Sitting - Level of Assistance: 5: Stand by assistance Static Standing Balance Static Standing - Level of Assistance: 5: Stand by assistance Dynamic Standing Balance Dynamic Standing - Level of Assistance: 5: Stand by assistance Extremity Assessment      RLE Assessment RLE Assessment: Exceptions to [facility]  Passive Range of Motion (PROM) Comments: WFL Active Range of Motion (AROM) Comments: limited due to hip flexor weakness, extensor tone General Strength Comments: hip flexion 2-/5, knee extension 4/5 with extensor tone, ankle DF 4-/5 LLE Assessment Active Range of Motion (AROM) Comments: [facility] General Strength Comments: Strength hip flexion 4/5. knee extension 4+/5, ankle DF 4/5     Excell Seltzer, PT, DPT 06/06/2018, 6:00 PM  Cyndi [place], PT 06/06/2018

## 2018-06-05 NOTE — Care Management (Signed)
[facility] Individual Statement of Services  Patient Name:  [name]  Date:  06/05/2018  Welcome to the [facility]patient [facility].  Our goal is to provide you with an individualized program based on your diagnosis and situation, designed to meet your specific needs.  With this comprehensive rehabilitation program, you will be expected to participate in at least 3 hours of rehabilitation therapies Monday-Friday, with modified therapy programming on the weekends.  Your rehabilitation program will include the following services:  Physical Therapy (PT), Occupational Therapy (OT), 24 hour per day rehabilitation nursing, Neuropsychology, Case Management (Social Worker), Rehabilitation Medicine, Nutrition Services and Pharmacy Services  Weekly team conferences will be held on Tuesdays to discuss your progress.  Your Social Worker will talk with you frequently to get your input and to update you on team discussions.  Team conferences with you and your family in attendance may also be held.  Expected length of stay: 7 days   Overall anticipated outcome: minimal assistance  Depending on your progress and recovery, your program may change. Your Social Worker will coordinate services and will keep you informed of any changes. Your Social Worker's name and contact numbers are listed  below.  The following services may also be recommended but are not provided by the Inpatient Rehabilitation Center:   Driving Evaluations  Home Health Rehabiltiation Services  Outpatient Rehabilitation Services  Arrangements will be made to provide these services after discharge if needed.  Arrangements include referral to agencies that provide these services.  Your insurance has been verified to be:  [facility] Medicare Your primary doctor is:  Lonie Peak  Pertinent information will be shared with your doctor and your insurance company.  Social Worker:  [place], [place] [phone] or ([phone]   Information discussed with and copy given to patient by: Amada Jupiter, 06/05/2018, 1:24 PM

## 2018-06-05 NOTE — Progress Notes (Signed)
Physical Therapy Session Note  Patient Details  Name: [name] MRN: [id number] Date of Birth: Oct 01, 1980  Today's Date: 06/05/2018 PT Individual Time: 1400-1500 PT Individual Time Calculation (min): 60 min   Short Term Goals: Week 1:  PT Short Term Goal 1 (Week 1): Pt will performed stand pivot transfer with CGA consistently  PT Short Term Goal 2 (Week 1): Pt will propell WC >255ft with set up assist only  PT Short Term Goal 3 (Week 1): Pt will ambulate 76ft with mod assist and LRAD  PT Short Term Goal 4 (Week 1): Pt will ascend/descend 4   Skilled Therapeutic Interventions/Progress Updates:    Pt received seated in w/c attempting to leave the rehab floor, pt informed that he has scheduled therapy session at this time. Pt agreeable to participate in session. Manual w/c propulsion x 500 ft from rehab floor down to first floor to [employer] in order to purchase coffee, use of BUE and Supervision. Pt takes several rest breaks on the way, is able to navigate hallways with v/c and needs min A to catch elevator before it closes. Floor transfer with min A, v/c for safe transfer technique. Education with patient about falls and fall prevention upon d/c home. Pt more receptive this session to education but also reports he does not plan on using an AD at home and that he is going to continue to "furniture walk" and if he falls he knows how to get himself back up. Pt is agreeable to demonstrate ambulation with RW. Ambulation x 50 ft with RW and mod A, circumduction with BLE resulting in kicking the RW during gait. Attempt to have pt perform B hip flexion with gait, fair results and ability to follow cues. Standing alt L/R forward steps in // bars with no UE support and min A for balance. Ascend/descend 4 stairs with 2 handrails and min A, step-to gait pattern. Pt left seated in w/c in room with needs in reach, chair alarm and quick release belt in place.  Therapy Documentation Precautions:   Precautions Precautions: Fall Precaution Comments: droplet Restrictions Weight Bearing Restrictions: No Pain: Pain Assessment Pain Scale: 0-10 Pain Score: 0-No pain    Therapy/Group: Individual Therapy   Peter Congo, PT, DPT  06/05/2018, 3:54 PM

## 2018-06-05 NOTE — Plan of Care (Signed)
  Problem: Consults Goal: RH GENERAL PATIENT EDUCATION Description See Patient Education module for education specifics. Outcome: Progressing   Problem: RH SKIN INTEGRITY Goal: RH STG SKIN FREE OF INFECTION/BREAKDOWN Description No new breakdown with mod I  assist   Outcome: Progressing   Problem: RH SAFETY Goal: RH STG ADHERE TO SAFETY PRECAUTIONS W/ASSISTANCE/DEVICE Description STG Adhere to Safety Precautions With Min Assistance/Device.  Outcome: Progressing   Problem: RH PAIN MANAGEMENT Goal: RH STG PAIN MANAGED AT OR BELOW PT'S PAIN GOAL Description < 3 out of 10.   Outcome: Progressing   

## 2018-06-05 NOTE — Progress Notes (Signed)
06/05/18 1154  What Happened  Was fall witnessed? No (nutritional service was in room at the time)  Patients activity before fall other (comment) (in room on Wheelchair)  Was patient injured? No  Patient found on floor  Found by Staff-comment (Bri- NT)  Stated prior activity other (comment) (trying to stand up)  Follow Up  MD notified Elita Quick, PA  Time MD notified 1158  Additional tests No  Simple treatment  (none needed. no injury)  Adult Fall Risk Assessment  Risk Factor Category (scoring not indicated) High fall risk per protocol (document High fall risk)  Age 38  Fall History: Fall within 6 months prior to admission 5  Elimination; Bowel and/or Urine Incontinence 0  Elimination; Bowel and/or Urine Urgency/Frequency 2  Medications: includes PCA/Opiates, Anti-convulsants, Anti-hypertensives, Diuretics, Hypnotics, Laxatives, Sedatives, and Psychotropics 5  Patient Care Equipment 0  Mobility-Assistance 2  Mobility-Gait 2  Mobility-Sensory Deficit 0  Altered awareness of immediate physical environment 1  Impulsiveness 2  Lack of understanding of one's physical/cognitive limitations 4  Total Score 23  Patient Fall Risk Level High fall risk  Adult Fall Risk Interventions  Required Bundle Interventions *See Row Information* High fall risk - low, moderate, and high requirements implemented  Additional Interventions Use of appropriate toileting equipment (bedpan, BSC, etc.);Lap belt while in chair/wheelchair  Screening for Fall Injury Risk (To be completed on HIGH fall risk patients) - Assessing Need for Low Bed  Risk For Fall Injury- Low Bed Criteria None identified - Continue screening  Screening for Fall Injury Risk (To be completed on HIGH fall risk patients who do not meet crieteria for Low Bed) - Assessing Need for Floor Mats Only  Risk For Fall Injury- Criteria for Floor Mats Noncompliant with safety precautions  Will Implement Floor Mats Yes  Vitals  BP 125/74  MAP (mmHg)  87  BP Location Left Arm  BP Method Automatic  Patient Position (if appropriate) Sitting  Pulse Rate 71  Resp 17  Oxygen Therapy  SpO2 96 %  O2 Device Room Air  Pain Assessment  Pain Scale 0-10  Pain Score 0  Neurological  Neuro (WDL) X  Level of Consciousness Alert  Orientation Level Oriented X4  Cognition Poor judgement;Poor safety awareness  Speech Clear  Pupil Assessment  No  R Hand Grip Moderate  L Hand Grip Moderate   RUE Motor Response Purposeful movement  RUE Sensation Tingling  RUE Motor Strength 4  LUE Motor Response Purposeful movement  LUE Sensation Tingling  LUE Motor Strength 4  RLE Motor Response Purposeful movement  RLE Sensation Tingling  RLE Motor Strength 4  LLE Motor Response Purposeful movement  LLE Sensation Tingling  LLE Motor Strength 4  Neuro Symptoms Agitation;Anxiety  Neuro symptoms relieved by Rest;Music  Musculoskeletal  Musculoskeletal (WDL) X  Assistive Device Wheelchair  Generalized Weakness Yes  Weight Bearing Restrictions No  Musculoskeletal Details  RUE Weakness  LUE Weakness  RLE Weakness  LLE Weakness  Integumentary  Integumentary (WDL) WDL  Pt stated that he was trying to stand up as the nutritional service was putting meal tray on the table. Pt stated he lost his balance and slid down. According to the patient, pt did not touch the floor. Pt stated that as he was trying to stand, the wheelchair rolled back. Pt states that he unlocked the wheelchair so that he can maneuver in the room. OT left pt on wheelchair with seatbelt alarm on around 1045. Chair alarm was not on at time  of fall. No injuries occurred from the fall. Vitals signs stable, no complaint of pain. Pam, PA made aware. New order obtained for telesitter. Will continue to monitor.   Elverna Caffee W Orlando Devereux

## 2018-06-05 NOTE — Progress Notes (Signed)
Physical Therapy Session Note  Patient Details  Name: [name] MRN: [id number] Date of Birth: Apr 28, 1981  Today's Date: 06/05/2018 PT Individual Time: 0805-0905 PT Individual Time Calculation (min): 60 min   Short Term Goals: Week 1:  PT Short Term Goal 1 (Week 1): Pt will performed stand pivot transfer with CGA consistently  PT Short Term Goal 2 (Week 1): Pt will propell WC >251f with set up assist only  PT Short Term Goal 3 (Week 1): Pt will ambulate 378fwith mod assist and LRAD  PT Short Term Goal 4 (Week 1): Pt will ascend/descend 4   Skilled Therapeutic Interventions/Progress Updates: Pt presented sitting EOB agreeable to therapy. Pt verbalized throughout session frustration of how ?MS fluctuates every day" and that therapy does not help. PTA encouraged pt to continue work on strengthening of R side. Pt initially agreeable to becoming distracted explaining progression of disease to PTA. Pt performed STS from EOB with CGA and took steps to window and back to w/c via "furniture walking". Pt was able to maintain fair standing balance while straightening scrubs before sitting in w/c. Pt propelled self to sink and performed oral hygiene mod I. Pt propelled down hallway only with BUE but pt agreeable to use LLE for energy conservation. Pt propelled to break area and prepared coffee mod I. Pt then propelled to back to room and left with call bell within reach and current needs met.      Therapy Documentation Precautions:  Precautions Precautions: Fall Precaution Comments: droplet Restrictions Weight Bearing Restrictions: No General:   Vital Signs: Therapy Vitals Pulse Rate: 71 Resp: 17 BP: 125/74 Patient Position (if appropriate): Sitting Oxygen Therapy SpO2: 96 % O2 Device: Room Air Pain: Pain Assessment Pain Scale: 0-10 Pain Score: 0-No pain    Therapy/Group: Individual Therapy  Atasha Colebank  Adam Demary, PTA  06/05/2018, 12:23 PM

## 2018-06-05 NOTE — Progress Notes (Signed)
[place] PHYSICAL MEDICINE & REHABILITATION PROGRESS NOTE   Subjective/Complaints:  Patient sitting comfortably in bed.  No new issues today.  States that "my MS is different every day".  ROS: Patient denies fever, rash, sore throat, blurred vision, nausea, vomiting, diarrhea, cough, shortness of breath or chest pain, joint or back pain, headache, or mood change.    Objective:   No results found. Recent Labs    06/05/18 0702  WBC 4.4  HGB 12.2*  HCT 38.2*  PLT 251   Recent Labs    06/05/18 0702  NA 140  K 4.5  CL 104  CO2 27  GLUCOSE 109*  BUN 13  CREATININE 0.95  CALCIUM 8.9    Intake/Output Summary (Last 24 hours) at 06/05/2018 0848 Last data filed at 06/04/2018 2100 Gross per 24 hour  Intake 600 ml  Output 800 ml  Net -200 ml     Physical Exam: Vital Signs Blood pressure 128/63, pulse 60, temperature 98 F (36.7 C), temperature source Oral, resp. rate 18, height 5\' 10"  (1.778 m), weight 78.2 kg, SpO2 100 %.  Constitutional: No distress . Vital signs reviewed. HEENT: EOMI, oral membranes moist Neck: supple Cardiovascular: RRR without murmur. No JVD    Respiratory: CTA Bilaterally without wheezes or rales. Normal effort    GI: BS +, non-tender, non-distended  Skin: No evidence of breakdown, no evidence of rash Neurologic: Cranial nerves II through XII intact, motor strength is 4 out of 5 proximally throughout but inconsistent in right deltoid, bicep, tricep, grip, hip flexor, knee extensors, ankle dorsiflexor and plantar flexor Ongoing limb ataxia, inconsistent sensory exam.  Musculoskeletal: Full range of motion in all 4 extremities. No joint swelling Psyc: Pleasant but circumferential   Assessment/Plan: 1. 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  Physiatrist is providing close team supervision  and 24 hour management of active medical problems listed below.  Physiatrist and rehab team continue to assess barriers to discharge/monitor patient progress toward functional and medical goals  Care Tool:  Bathing    Body parts bathed by patient: Right arm, Left arm, Chest, Abdomen, Front perineal area, Buttocks, Right upper leg, Left upper leg, Right lower leg, Left lower leg, Face   Body parts bathed by helper: Right lower leg, Left lower leg     Bathing assist Assist Level: Supervision/Verbal cueing     Upper Body Dressing/Undressing Upper body dressing   What is the patient wearing?: Pull over shirt    Upper body assist Assist Level: Set up assist    Lower Body Dressing/Undressing Lower body dressing      What is the patient wearing?: Pants     Lower body assist Assist for lower body dressing: Supervision/Verbal cueing     Toileting Toileting    Toileting assist Assist for toileting: Independent Assistive Device Comment: urinal   Transfers Chair/bed transfer  Transfers assist     Chair/bed transfer assist level: Minimal Assistance - Patient > 75%     Locomotion Ambulation   Ambulation assist      Assist level: Moderate Assistance - Patient 50 - 74% Assistive device: Hand held assist Max distance: 180'   Walk 10 feet activity   Assist     Assist level: Moderate Assistance - Patient - 50 - 74% Assistive device: Hand held assist   Walk 50 feet activity   Assist Walk 50 feet with  2 turns activity did not occur: Safety/medical concerns  Assist level: Moderate Assistance - Patient - 50 - 74% Assistive device: Hand held assist    Walk 150 feet activity   Assist Walk 150 feet activity did not occur: Safety/medical concerns  Assist level: Moderate Assistance - Patient - 50 - 74% Assistive device: Hand held assist    Walk 10 feet on uneven surface  activity   Assist Walk 10 feet on uneven surfaces activity did not occur:  Safety/medical concerns         Wheelchair     Assist   Type of Wheelchair: Manual    Wheelchair assist level: Supervision/Verbal cueing Max wheelchair distance: 258ft     Wheelchair 50 feet with 2 turns activity    Assist        Assist Level: Supervision/Verbal cueing   Wheelchair 150 feet activity     Assist     Assist Level: Supervision/Verbal cueing    Medical Problem List and Plan: 1.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  There seemed to be some breakthrough as far as acceptance of this today although honestly I am not sure and do not expect any carryover from our conversation unfortunately.  I asked him to work with the team to try and except other means of mobility, use of equipment, different techniques, etc. 2. DVT Prophylaxis/Anticoagulation: Pharmaceutical:Lovenox 3. Pain Management:tylenol prn 4. Mood:LCSW to follow for evaluation and support. 5. Neuropsych: This patientis not fullycapable of making decisions onhisown behalf. 6. Skin/Wound Care:Routine pressure relief measures. 7. Fluids/Electrolytes/Nutrition:Monitor I/O.    -I personally reviewed the patient's labs today.   All within normal limits 8. Flu: completed 5 d tamiflu. Continue respiratory precautions. 9. FBP:ZWCHENID.  10. Leucocytosis:  resolved 11.Asymptomatic bradycardia: Intermittent lows with HR in 50's. Monitor forsymptoms.   Vitals:   06/04/18 2245 06/05/18 0501  BP:  128/63  Pulse:  60  Resp:  18  Temp: 98 F (36.7 C) 98 F (36.7 C)  SpO2:  100%    LOS:   5 days A FACE TO FACE EVALUATION WAS PERFORMED  Ranelle Oyster 06/05/2018, 8:48 AM

## 2018-06-06 ENCOUNTER — Inpatient Hospital Stay (HOSPITAL_COMMUNITY): Payer: Medicare HMO | Admitting: Occupational Therapy

## 2018-06-06 ENCOUNTER — Inpatient Hospital Stay (HOSPITAL_COMMUNITY): Payer: Medicare HMO

## 2018-06-06 ENCOUNTER — Inpatient Hospital Stay (HOSPITAL_COMMUNITY): Payer: Medicare HMO | Admitting: Physical Therapy

## 2018-06-06 NOTE — Plan of Care (Signed)
  Problem: Consults Goal: RH GENERAL PATIENT EDUCATION Description See Patient Education module for education specifics. Outcome: Progressing   Problem: RH SKIN INTEGRITY Goal: RH STG SKIN FREE OF INFECTION/BREAKDOWN Description No new breakdown with mod I  assist   Outcome: Progressing

## 2018-06-06 NOTE — Progress Notes (Signed)
Occupational Therapy Session Note  Patient Details  Name: [name] MRN: [id number] Date of Birth: 03-05-1981  Today's Date: 06/06/2018 OT Individual Time: 1130-1245 OT Individual Time Calculation (min): 75 min    Short Term Goals: Week 1:  OT Short Term Goal 1 (Week 1): Pt will be independent with set up for meals OT Short Term Goal 2 (Week 1): Pt will be Supervision for transfer to toilet OT Short Term Goal 3 (Week 1): Pt will be min assist with tub/shower transfer OT Short Term Goal 4 (Week 1): Pt will be supervision with bathing OT Short Term Goal 5 (Week 1): Pt will be supervision with dressing  Skilled Therapeutic Interventions/Progress Updates: approached patient for therapy session, and he stated he wanted to shower.   He adamantly stated he did not want anyone to help him bathe, dress, or complete any other portion of his bathing and dressing.    He started to slightly escalate in behavior and affect when this clinician told him he cannot be allowed to stand or sit in the wet shower by himself due to decreased balance, but he regulated himself and calmed down after he stated to this clinician that she can stand behind the shower curtain, and clinician stated, "I prefer not because I am concerned about your balance."    He completed his bathing and dressing (blue hospital scrubs) behind the curtain.  He would not allow this clinician to view his balance and level of self care.  He was left seated in his w/c in his room with call bell near     Therapy Documentation Precautions:  Precautions Precautions: Fall Precaution Comments: droplet Restrictions Weight Bearing Restrictions: No  Pain: Pain Assessment Pain Score: 0-No pain  Therapy/Group: Individual Therapy  Rozelle Logan 06/06/2018, 3:50 PM

## 2018-06-06 NOTE — Progress Notes (Signed)
Patient is adamant with nursing and therapy that he does not want to have a chair alarm or seat belt on. I have explained the risks to patient since he had a fall yesterday and explained that our top priority is to keep him safe. He states he understands the risk but refuses to have an alarm on him. Patient is very annoyed with staff limiting his independence.To avoid escalation, we have left the seat belt off, but the tele-sitter is in the room with patient.

## 2018-06-06 NOTE — Consult Note (Signed)
   [facility] CM Inpatient Consult   06/06/2018  [name] 08-18-1980 [id number]   Referral received from inpatient case manager, Lorre Nick, to assess for care management services.    Met with the patient,  sitting up in wheelchair, regarding the benefits of [facility] services. Explained that [facility] is a covered benefit of insurance. Patient states, " I really don't see the point of having this service.  I don't live in this community."  Explained that [facility] is in the [facility] area.  Explained that the patient has access to all disciplines as needed at no cost.  He states, "I really don't like nobody coming to my house. I had them send a nurse out and from that a large amount of money got missing.  They therapist came out and I had told him I was interested that day, that particular day, I didn't feel like doing it [therapy] but he came anyway.  He drove an hour to make a 20 minute visit.  I don't understand it.  And bedsides if I need a nurse or something, I have an app on my phone that I can call right now if I needed a visit and my insurance pays for it." Review information for [facility] and a brochure was provided with contact information.  Explained that [facility] does not interfere with or replace any services arranged by the inpatient care management staff.  Patient declined services with [facility].  Follow up with Lorre Nick and Pam that patient is declining services.  He denies any medication follow up needs, community resources, or support.  He states, "If I was older like 63 or 44 this might help but naw, I don't see the need."  Natividad Brood, RN BSN [facility] Liaison  [phone] business mobile phone Toll free office [phone]

## 2018-06-06 NOTE — Progress Notes (Signed)
Physical Therapy Session Note  Patient Details  Name: [name] MRN: [id number] Date of Birth: 06/11/1980  Today's Date: 06/06/2018 PT Individual Time: 1550-1615 PT Individual Time Calculation (min): 25 min   Short Term Goals: Week 1:  PT Short Term Goal 1 (Week 1): Pt will performed stand pivot transfer with CGA consistently  PT Short Term Goal 2 (Week 1): Pt will propell WC >271f with set up assist only  PT Short Term Goal 3 (Week 1): Pt will ambulate 35fwith mod assist and LRAD  PT Short Term Goal 4 (Week 1): Pt will ascend/descend 4   Skilled Therapeutic Interventions/Progress Updates:   WC mobility throughout rehab unit without cues or assist from PT over level surfaces.  As well as min assist to ascend ramp in orthogym. Gait over unlevel surface with supervision assist UE support on rail and rail as needed by pt. Bed mobility in training apartment to simulated home environment without cues or assist from PT. Patient returned to [facility] and [facility] reach and all needs met.          Therapy Documentation Precautions:  Precautions Precautions: Fall Precaution Comments: droplet Restrictions Weight Bearing Restrictions: No    Vital Signs: Therapy Vitals Temp: 98.9 F (37.2 C) Temp Source: Oral Pulse Rate: 72 Resp: 19 BP: 128/90 Patient Position (if appropriate): Sitting Oxygen Therapy SpO2: 100 % O2 Device: Room Air Pain: Pain Assessment Pain Score: 0-No pain    Therapy/Group: Individual Therapy  AuLorie Phenix/09/2018, 4:33 PM

## 2018-06-06 NOTE — Progress Notes (Signed)
Social Work Patient ID: [name], male   DOB: 01/31/1981, 38 y.o.   MRN: [id number]  Have confirmed with treatment team that they feel pt is ready for d/c tomorrow.  Again, have reviewed safety recommendations for pt with pt and daughter (and VM left for spouse).  Pt states understanding of this recommendation and his risk of injury if not followed.  He is reluctantly agreeable with w/c which I have ordered. Have also arranged HH follow up via [facility].  Henry Scorsone, LCSW

## 2018-06-06 NOTE — Consult Note (Signed)
   [facility] CM Inpatient Consult   06/06/2018  [name] 03/20/1981 [id number]   Referral received from inpatient case manager, Lucy, to assess for care management services.    Met with the patient,  sitting up in wheelchair, regarding the benefits of [facility] Management services. Explained that [facility] is a covered benefit of insurance. Patient states, " I really don't see the point of having this service.  I don't live in this community."  Explained that [facility] is in the [place] area.  Explained that the patient has access to all disciplines as needed at no cost.  He states, "I really don't like nobody coming to my house. I had them send a nurse out and from that a large amount of money got missing.  They therapist came out and I had told him I was interested that day, that particular day, I didn't feel like doing it [therapy] but he came anyway.  He drove an hour to make a 20 minute visit.  I don't understand it.  And bedsides if I need a nurse or something, I have an app on my phone that I can call right now if I needed a visit and my insurance pays for it." Review information for [facility] Management and a brochure was provided with contact information.  Explained that [facility] Management does not interfere with or replace any services arranged by the inpatient care management staff.  Patient declined services with [facility].  Follow up with Lucy and Pam that patient is declining services.  He denies any medication follow up needs, community resources, or support.  He states, "If I was older like 60 or 70 this might help but naw, I don't see the need."  Noella Kipnis, RN BSN CCM [facility] Liaison  [phone] business mobile phone Toll free office [phone]    

## 2018-06-06 NOTE — Progress Notes (Signed)
Social Work  Discharge Note  The overall goal for the admission was met for:   Discharge location: Yes - pt to return home with his mother who can only provide intermittent support. (see prior SW note about living situation, risks, etc.)  Length of Stay: Yes - 8 days  Discharge activity level: Yes - minimal assist overall  Home/community participation: Yes  Services provided included: MD, RD, PT, OT, RN, TR, Pharmacy, Neuropsych and SW  Financial Services: Medicare  Follow-up services arranged: Home Health: PT, OT via [place] [place], DME: 18x18 lightweight w/c , cushion via [facility] and Patient/Family has no preference for HH/DME agencies  Comments (or additional information):  Contact #s:  Pt @ [phone]     Daughter, Kingdom Vanzanten @ [phone]  Patient/Family verbalized understanding of follow-up arrangements: Yes  Individual responsible for coordination of the follow-up plan: pt  Confirmed correct DME delivered: Lennart Pall 06/06/2018    HOYLE, LUCY

## 2018-06-06 NOTE — Progress Notes (Signed)
Occupational Therapy Session Note  Patient Details  Name: [name] MRN: [id number] Date of Birth: May 12, 1980  Today's Date: 06/06/2018 OT Individual Time: 1745-1820 OT Individual Time Calculation (min): 35 min    Skilled Therapeutic Interventions/Progress Updates: patient stated he did not need to complete more therapy except to strengthen his right upper extremity andl lower extremties.   He did not seem to see that therapies he has participated in addressed his right extremities - even when completed as bilateral or balance or endurance activities.    He seemed satisfied this evening to work on the NuStep and stated, "This right here is really working my right side also.  If they had this at my gym where I work out, I would use it."     After completing therapy in day room, he was left in his w/c with call bell and phone within reach.     Therapy Documentation Precautions:  Precautions Precautions: Fall Precaution Comments: droplet Restrictions Weight Bearing Restrictions: No General: General OT Amount of Missed Time: 40 Minutes(40)   Pain: Pain Assessment Pain Score: 0-No pain  Therapy/Group: Individual Therapy  Rozelle Logan 06/06/2018, 8:55 PM

## 2018-06-06 NOTE — Progress Notes (Addendum)
[place] PHYSICAL MEDICINE & REHABILITATION PROGRESS NOTE   Subjective/Complaints:  No new issues.   ROS: Patient denies fever, rash, sore throat, blurred vision, nausea, vomiting, diarrhea, cough, shortness of breath or chest pain, joint or back pain, headache, or mood change.   Objective:   No results found. Recent Labs    06/05/18 0702  WBC 4.4  HGB 12.2*  HCT 38.2*  PLT 251   Recent Labs    06/05/18 0702  NA 140  K 4.5  CL 104  CO2 27  GLUCOSE 109*  BUN 13  CREATININE 0.95  CALCIUM 8.9    Intake/Output Summary (Last 24 hours) at 06/06/2018 0804 Last data filed at 06/06/2018 0515 Gross per 24 hour  Intake 540 ml  Output 1400 ml  Net -860 ml     Physical Exam: Vital Signs Blood pressure 134/65, pulse 60, temperature 98.1 F (36.7 C), temperature source Oral, resp. rate 18, height 5\' 10"  (1.778 m), weight 78.2 kg, SpO2 100 %.  Constitutional: No distress . Vital signs reviewed. HEENT: EOMI, oral membranes moist Neck: supple Cardiovascular: RRR without murmur. No JVD    Respiratory: CTA Bilaterally without wheezes or rales. Normal effort    GI: BS +, non-tender, non-distended  Skin: No evidence of breakdown, no evidence of rash Neurologic: Cranial nerves II through XII intact, motor strength is 4 out of 5 proximally throughout but inconsistent in right deltoid, bicep, tricep, grip, hip flexor, knee extensors, ankle dorsiflexor and plantar flexor--stable Ongoing limb ataxia, inconsistent sensory exam.  Musculoskeletal: Full range of motion in all 4 extremities. No joint swelling Psyc: Pleasant but circumferential   Assessment/Plan: 1. 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  Physiatrist is providing close team supervision and 24 hour management of active medical problems listed below.  Physiatrist  and rehab team continue to assess barriers to discharge/monitor patient progress toward functional and medical goals  Care Tool:  Bathing    Body parts bathed by patient: Right arm, Left arm, Chest, Abdomen, Front perineal area, Buttocks, Right upper leg, Left upper leg, Right lower leg, Left lower leg, Face   Body parts bathed by helper: Right lower leg, Left lower leg     Bathing assist Assist Level: Supervision/Verbal cueing     Upper Body Dressing/Undressing Upper body dressing   What is the patient wearing?: Pull over shirt    Upper body assist Assist Level: Supervision/Verbal cueing    Lower Body Dressing/Undressing Lower body dressing      What is the patient wearing?: Pants     Lower body assist Assist for lower body dressing: Supervision/Verbal cueing     Toileting Toileting    Toileting assist Assist for toileting: Supervision/Verbal cueing Assistive Device Comment: urinal   Transfers Chair/bed transfer  Transfers assist     Chair/bed transfer assist level: Contact Guard/Touching assist     Locomotion Ambulation   Ambulation assist      Assist level: Moderate Assistance - Patient 50 - 74% Assistive device: Walker-rolling Max distance: 50'   Walk 10 feet activity   Assist     Assist level: Moderate Assistance - Patient - 50 - 74% Assistive device: Walker-rolling   Walk 50 feet activity   Assist Walk 50 feet with 2 turns activity did not occur: Safety/medical concerns  Assist level: Moderate Assistance - Patient - 50 - 74% Assistive device: Walker-rolling  Walk 150 feet activity   Assist Walk 150 feet activity did not occur: Safety/medical concerns  Assist level: Moderate Assistance - Patient - 50 - 74% Assistive device: Hand held assist    Walk 10 feet on uneven surface  activity   Assist Walk 10 feet on uneven surfaces activity did not occur: Safety/medical concerns         Wheelchair     Assist   Type of  Wheelchair: Manual    Wheelchair assist level: Supervision/Verbal cueing Max wheelchair distance: 269ft     Wheelchair 50 feet with 2 turns activity    Assist        Assist Level: Supervision/Verbal cueing   Wheelchair 150 feet activity     Assist     Assist Level: Supervision/Verbal cueing    Medical Problem List and Plan: 1.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 DVT Prophylaxis/Anticoagulation: Pharmaceutical:Lovenox 3. Pain Management:tylenol prn 4. Mood:LCSW to follow for evaluation and support. 5. Neuropsych: This patientiscapable of making decisions onhisown behalf. 6. Skin/Wound Care:Routine pressure relief measures. 7. Fluids/Electrolytes/Nutrition:Monitor I/O.     -encourage PO 8. Flu: completed 5 d tamiflu. Continue respiratory precautions. 9. NAT:FTDDUKGU.  10. Leucocytosis:  resolved 11.Asymptomatic bradycardia: Intermittent lows with HR in 50's. Monitor forsymptoms.   Vitals:   06/05/18 1915 06/06/18 0512  BP: 132/82 134/65  Pulse: 68 60  Resp: 18 18  Temp: 98.8 F (37.1 C) 98.1 F (36.7 C)  SpO2: 100% 100%    LOS:   6 days A FACE TO FACE EVALUATION WAS PERFORMED  Ranelle Oyster 06/06/2018, 8:04 AM

## 2018-06-06 NOTE — Progress Notes (Signed)
Patient's smoking device given to his daughter at this time, per pt instruction.

## 2018-06-06 NOTE — Progress Notes (Signed)
Physical Therapy Session Note  Patient Details  Name: [name] MRN: [id number] Date of Birth: Dec 26, 1980  Today's Date: 06/06/2018 PT Individual Time: 0950-1045 PT Individual Time Calculation (min): 55 min   Short Term Goals: Week 1:  PT Short Term Goal 1 (Week 1): Pt will performed stand pivot transfer with CGA consistently  PT Short Term Goal 2 (Week 1): Pt will propell WC >25ft with set up assist only  PT Short Term Goal 3 (Week 1): Pt will ambulate 53ft with mod assist and LRAD  PT Short Term Goal 4 (Week 1): Pt will ascend/descend 4   Skilled Therapeutic Interventions/Progress Updates:    Patient in supine apologizing for not being dressed and showered due to sweating all night.  Also apologizing for his MS, and though stated he was willing to try assistive devices reports they will make him fall more and that he has to take his time and stay focused on his walking or he will fall down.  Reports feels he still needs to work on his right side weakness and plans to work with his son and possibly go to the gym when discharged.  Educated in PT concerns for pt not wanting to utilize compensatory technique and devices due to neurologic weakness/abnormal tone affecting safety/control and coordination R>L UE/LE and pt continued to report plans for home without use of devices, but stated HE HAS EVERY DEVICE KNOWN TO MAN AT HOME IN STORAGE.    Performed in room ambulation with close S furniture walking to dresser to open drawer, to close and reopen room door and to open blinds at window.  Bending to pick up paper off floor with hand on window sill S as well.  Patient ambulated in hallway with HHA mod A x 60' to dayroom with significant circumduction on R and L lateral lean for swing on R.  Demonstrated sitting balance with resistance seated edge of chair no LOB, bending to floor and arms up leaning to sides with S.  Patient c/o R shoulder pain at times and seems more tender to palpation over biceps  tendon area.  Patient able to demonstrate floor to chair transfers with S.  Negotiate 4 steps with rails and S with increased time and circumduction on R to advance up stairs, self selected sequence with L foot up first, R down first.  Patient propelled w/c 34' to ortho gym and demonstrated car transfers from w/c to sedan level with S.  Discussed increased difficulty if to high truck due to having to step up on running board or pull up onto seat.  Patient propelled w/c 200' mod I with UE's stopping to get coffee on his own and planned to return to room on his own.    Therapy Documentation Precautions:  Precautions Precautions: Fall Precaution Comments: droplet Restrictions Weight Bearing Restrictions: No :   Pain: Pain Assessment Pain Score: 0-No pain      Therapy/Group: Individual Therapy  [name]  [place], [place] [phone] 06/06/2018  06/06/2018, 12:52 PM

## 2018-06-06 NOTE — Discharge Summary (Signed)
Physician Discharge Summary  Patient ID: [name] MRN: [id number] DOB/AGE: 1980-11-05 38 y.o.  Admit date: 05/31/2018 Discharge date: 06/07/2018  Discharge Diagnoses:  Principal Problem:   Multiple sclerosis pseudoexacerbation [facility]) Active Problems:   Weakness   Cognitive and behavioral changes   Discharged Condition:  Stable   Significant Diagnostic Studies: [name] Contrast  Result Date: 05/28/2018 CLINICAL DATA:  Initial evaluation for multiple sclerosis, new neurologic event. EXAM: MRI HEAD WITHOUT AND WITH CONTRAST TECHNIQUE: Multiplanar, multiecho pulse sequences of the brain and surrounding structures were obtained without and with intravenous contrast. CONTRAST:  80 cc of Gadavist. COMPARISON:  Prior MRI from 11/12/2017 FINDINGS: Brain: Advanced cerebral and cerebellar atrophy for age, stable. Extensive T2/FLAIR hyperintensities seen involving the periventricular, deep, and subcortical white matter both cerebral hemispheres in a distribution consistent with provided history of multiple sclerosis. Patchy involvement of the brainstem and cerebellum as well. Numerous T1 hypointense black holes. Overall, appearance is grossly stable from previous without evidence for significant disease progression. No diffusion abnormality or abnormal enhancement to suggest active demyelination. No mass lesion, midline shift or mass effect. No hydrocephalus. No extra-axial fluid collection. No evidence for acute or subacute ischemia. No other abnormal enhancement. Pituitary gland within normal limits. Midline structures intact. Vascular: Major intracranial vascular flow voids maintained. Skull and upper cervical spine: Craniocervical junction normal. Bone marrow signal intensity normal. No scalp soft tissue abnormality. Sinuses/Orbits: Globes and orbital soft tissues within normal limits. Paranasal sinuses are largely clear. No significant mastoid effusion. Inner ear structures grossly normal.  Other: None. IMPRESSION: 1. Findings consistent with advanced multiple sclerosis, relatively stable in appearance as compared to most recent MRI from 11/12/2017. No evidence for significant disease progression or active demyelination. 2. No other acute intracranial abnormality. Advanced atrophy for age. Electronically Signed   By: Rise Mu M.D.   On: 05/28/2018 20:12    Labs:  Basic Metabolic Panel: BMP Latest Ref Rng & Units 06/05/2018 06/01/2018 05/31/2018  Glucose 70 - 99 mg/dL 384(T) 89 364(W)  BUN 6 - 20 mg/dL 13 17 13   Creatinine 0.61 - 1.24 mg/dL 8.03 2.12 2.48  BUN/Creat Ratio 9 - 20 - - -  Sodium 135 - 145 mmol/L 140 141 141  Potassium 3.5 - 5.1 mmol/L 4.5 4.3 4.1  Chloride 98 - 111 mmol/L 104 106 102  CO2 22 - 32 mmol/L 27 27 28   Calcium 8.9 - 10.3 mg/dL 8.9 2.5(O) 9.3    CBC: CBC Latest Ref Rng & Units 06/05/2018 06/01/2018 05/31/2018  WBC 4.0 - 10.5 K/uL 4.4 5.9 13.5(H)  Hemoglobin 13.0 - 17.0 g/dL 12.2(L) 12.6(L) 12.5(L)  Hematocrit 39.0 - 52.0 % 38.2(L) 39.9 38.3(L)  Platelets 150 - 400 K/uL 251 225 241    CBG: No results for input(s): GLUCAP in the last 168 hours.  Brief HPI:  [name] is a 38 year old male with history of HTN, T2DM, MS--treated with Tysarbi till a year ago; who was admitted on 05/28/18 with worsening of right sided weakness, malaise, fever- 101.4 and increase in falls. MRI brain showed advance MS without active demyelination. Respiratory culture positive for H Flu and he was started on Oseltamivir and IV solumedrol added by  Neurology due to concerns of Pseudo exacerbation of MS. He had defervesced but continued to have limitations in mobility and ADLs. CIR was recommended for follow up therapy.     Hospital Course: Henry MANAOIS was admitted to rehab 05/31/2018 for inpatient therapies to consist  of PT, ST and OT at least three hours five days a week. Past admission physiatrist, therapy team and rehab RN have worked together to provide customized  collaborative inpatient rehab. He completed 5 day course of Tamiflu without side effects. Respiratory status is stable and he has been afebrile. Elevated BP felt to be due to recent steroid. BP has been monitored on bid basis and are trending down. Follow up labs revealed steroid induced hyperglycemia is resolving. Follow up CBC showed that anemia of chronic disease is stable without sign of bleeding.   He continued to be limited by poor safety awareness with refusal to accept any AE or AD to help with ADLs or safety with mobility. He refused to participate in therapy consistently during his stay.  He has been educated extensively regarding fall prevention and need to use wheelchair when alone at home.  [facility]. Supervision is recommended after discharge for fall prevention and safety. He will continue to receive follow up HHPT and HHOT   Rehab course: During patient's stay in rehab weekly team conferences were held to monitor patient's progress, set goals and discuss barriers to discharge. At admission, patient required min assist with ADLs and max assist with mobility.  He  has had improvement in activity tolerance, balance, postural control as well as ability to compensate for deficits. He requires supervision with ADL tasks. He requires min assist with transfers and mod assist with ambulation. He is able to propel his wheelchair at modified independent level.    Disposition: Home   Diet: Regular   Special Instructions: 1.  Needs 24 hours supervision. Ambulate with assistance.  2. Will need follow up on anemia.  3. Follow with neurology after discharge.    Allergies as of 06/07/2018   No Known Allergies     Medication List    STOP taking these medications   oseltamivir 75 MG capsule Commonly known as:  TAMIFLU     TAKE these medications   acetaminophen 325 MG tablet Commonly known as:  TYLENOL Take 1-2 tablets (325-650 mg total) by mouth every 4  (four) hours as needed for mild pain.   Baclofen 5 MG Tabs Take 5 mg by mouth 2 (two) times daily for 30 days.   pantoprazole 40 MG tablet Commonly known as:  PROTONIX Take 1 tablet (40 mg total) by mouth daily.      Follow-up Information    Lonie PeakConroy, Nathan, PA-C Follow up on 06/13/2018.   Specialty:  Physician Assistant Why:  @ 2:30 pm (hospital follow up appt). Discuss neurology follow up with MD of your choice.  Contact information: [address] [place] [address] [phone]        Ranelle OysterSwartz, Zachary T, MD Follow up.   Specialty:  Physical Medicine and Rehabilitation Why:  call as needed Contact information: [address] [place] [address] [phone]           Signed: Jacquelynn Creeamela S Love 06/09/2018, 10:54 PM

## 2018-06-06 NOTE — Progress Notes (Signed)
Social Work Patient ID: [name], male   DOB: Dec 23, 1980, 38 y.o.   MRN: [id number]  Have reviewed team conference with pt and daughter (via phone) and left VM for pt's wife.  Pt made aware that team feels he continues to be resistant to recommendations and that he will continue to be a risk of harm to himself at home if 1) he doesn't have 24/7 assistance and 2) if he chooses to ambulate in the home instead of staying at w/c level mobility.   Pt expressing frustration that he feels team is "not listening to me... I need my right side to be stronger."   Have discussed this situation with Midge Minium, Therapy supervisor and have alerted Dr. Riley Kill to my discussions.   Daughter agrees with this SW that pt using poor safety judgement in the home and confirms he has had several falls at home.  Notes that family tries to provide as much support as they can but simply cannot provide 24/7 care. She is aware that he will likely be d/c'd tomorrow and states she will be the family member to pick him up.  Have spoken with Charlesetta Shanks, RN with [facility] who will review case for [facility] case management in the community.  Will update this afternoon once plans are all confirmed.  Jonita Hirota, LCSW

## 2018-06-06 NOTE — Progress Notes (Signed)
This nurse notified by telesitter that patient was in his room vaping. Upon approach by this Passenger transport manager, pt questioned about using the e-cigarette. Pt admitted to utilizing the device and then states "I mean I'm sorry, but I'm not sorry, I'm going to go off and smoke, I mean I'm a smoker". Pt informed that smoking, as well as the use of electronic cigarettes is prohibited on campus. Pt was offered to have a nicotine patch ordered, pt refused, stating "that is not realistic for a smoker like me, I would rather go without one". Electronic cigarette given to this Clinical research associate by patient, to be taken home by patient's family.

## 2018-06-07 MED ORDER — PANTOPRAZOLE SODIUM 40 MG PO TBEC: 40.0000 mg | DELAYED_RELEASE_TABLET | Freq: Every day | ORAL | 0 refills | Status: DC

## 2018-06-07 MED ORDER — BACLOFEN 5 MG PO TABS: 5.0000 mg | ORAL_TABLET | Freq: Two times a day (BID) | ORAL | 0 refills | Status: AC

## 2018-06-07 MED ORDER — ACETAMINOPHEN 325 MG PO TABS: 325.0000 mg | ORAL_TABLET | ORAL | Status: DC | PRN

## 2018-06-07 NOTE — Plan of Care (Signed)
  Problem: Consults Goal: RH GENERAL PATIENT EDUCATION Description See Patient Education module for education specifics. Outcome: Progressing   Problem: RH SKIN INTEGRITY Goal: RH STG SKIN FREE OF INFECTION/BREAKDOWN Description No new breakdown with mod I  assist   Outcome: Progressing   Problem: RH SAFETY Goal: RH STG ADHERE TO SAFETY PRECAUTIONS W/ASSISTANCE/DEVICE Description STG Adhere to Safety Precautions With Min Assistance/Device.  Outcome: Progressing   Problem: RH PAIN MANAGEMENT Goal: RH STG PAIN MANAGED AT OR BELOW PT'S PAIN GOAL Description < 3 out of 10.   Outcome: Progressing   

## 2018-06-07 NOTE — Progress Notes (Signed)
[facility] PHYSICAL MEDICINE & REHABILITATION PROGRESS NOTE   Subjective/Complaints:  No new problems. Appreciative of care here. Willing to use w/c. Knows he has to do better with safety  ROS: Patient denies fever, rash, sore throat, blurred vision, nausea, vomiting, diarrhea, cough, shortness of breath or chest pain, joint or back pain, headache, or mood change.    Objective:   No results found. Recent Labs    06/05/18 0702  WBC 4.4  HGB 12.2*  HCT 38.2*  PLT 251   Recent Labs    06/05/18 0702  NA 140  K 4.5  CL 104  CO2 27  GLUCOSE 109*  BUN 13  CREATININE 0.95  CALCIUM 8.9    Intake/Output Summary (Last 24 hours) at 06/07/2018 0841 Last data filed at 06/07/2018 0015 Gross per 24 hour  Intake 120 ml  Output 1200 ml  Net -1080 ml     Physical Exam: Vital Signs Blood pressure 139/73, pulse 64, temperature 98.2 F (36.8 C), temperature source Oral, resp. rate 16, height 5\' 10"  (1.778 m), weight 78.2 kg, SpO2 99 %.  Constitutional: No distress . Vital signs reviewed. HEENT: EOMI, oral membranes moist Neck: supple Cardiovascular: RRR without murmur. No JVD    Respiratory: CTA Bilaterally without wheezes or rales. Normal effort    GI: BS +, non-tender, non-distended  Skin: No evidence of breakdown, no evidence of rash Neurologic: Cranial nerves II through XII intact, motor strength is 4 out of 5 proximally throughout but inconsistent in right deltoid, bicep, tricep, grip, hip flexor, knee extensors, ankle dorsiflexor and plantar flexor. Ongoing ataxa Musculoskeletal: Full range of motion in all 4 extremities. No joint swelling Psyc: Pleasant, remains circumferential   Assessment/Plan: 1. 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  Physiatrist is providing close team supervision and 24 hour management of  active medical problems listed below.  Physiatrist and rehab team continue to assess barriers to discharge/monitor patient progress toward functional and medical goals  Care Tool:  Bathing    Body parts bathed by patient: Right arm, Left arm, Chest, Abdomen, Front perineal area, Buttocks, Right upper leg, Left upper leg, Right lower leg, Left lower leg, Face   Body parts bathed by helper: Right lower leg, Left lower leg     Bathing assist Assist Level: Supervision/Verbal cueing     Upper Body Dressing/Undressing Upper body dressing   What is the patient wearing?: Pull over shirt    Upper body assist Assist Level: Supervision/Verbal cueing    Lower Body Dressing/Undressing Lower body dressing      What is the patient wearing?: Pants     Lower body assist Assist for lower body dressing: Supervision/Verbal cueing     Toileting Toileting    Toileting assist Assist for toileting: Supervision/Verbal cueing Assistive Device Comment: urinal   Transfers Chair/bed transfer  Transfers assist     Chair/bed transfer assist level: Supervision/Verbal cueing     Locomotion Ambulation   Ambulation assist      Assist level: Moderate Assistance - Patient 50 - 74% Assistive device: Hand held assist Max distance: 60'   Walk 10 feet activity   Assist     Assist level: Moderate Assistance - Patient - 50 - 74% Assistive device: Hand held assist   Walk 50 feet activity   Assist Walk 50 feet with 2 turns activity did not occur: Safety/medical concerns  Assist level: Moderate Assistance - Patient - 50 - 74% Assistive device: Hand held assist    Walk 150 feet activity   Assist Walk 150 feet activity did not occur: Safety/medical concerns  Assist level: Moderate Assistance - Patient - 50 - 74% Assistive device: Hand held assist    Walk 10 feet on uneven surface  activity   Assist Walk 10 feet on uneven surfaces activity did not occur: Safety/medical  concerns   Assist level: Supervision/Verbal cueing(UE support on rail at [facility] )     Wheelchair     Assist   Type of Wheelchair: Manual    Wheelchair assist level: Independent Max wheelchair distance: 235ft     Wheelchair 50 feet with 2 turns activity    Assist        Assist Level: Independent   Wheelchair 150 feet activity     Assist     Assist Level: Independent    Medical Problem List and Plan: 1.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 DVT Prophylaxis/Anticoagulation: Pharmaceutical:Lovenox 3. Pain Management:tylenol prn 4. Mood:LCSW to follow for evaluation and support. 5. Neuropsych: This patientiscapable of making decisions onhisown behalf. 6. Skin/Wound Care:Routine pressure relief measures. 7. Fluids/Electrolytes/Nutrition:Monitor I/O.     -encourage PO 8. Flu: completed 5 d tamiflu. Continue respiratory precautions. 9. HWY:SHUOHFGB.  10. Leucocytosis:  resolved 11.Asymptomatic bradycardia: Intermittent lows with HR in 50's. Monitor forsymptoms.   Vitals:   06/07/18 0528 06/07/18 0530  BP: (!) 142/66 139/73  Pulse: 61 64  Resp:    Temp:    SpO2: 99%     LOS:   7 days A FACE TO FACE EVALUATION WAS PERFORMED  Ranelle Oyster 06/07/2018, 8:41 AM

## 2018-06-07 NOTE — Progress Notes (Signed)
Pt discharged home with family. Belongings sent with pt. No complaint of pain or discomfort voiced. Pt stable at time of discharge. Discharge instructions given by Elita Quick, PA. No further questions from pt or family.   [name]

## 2018-06-07 NOTE — Discharge Instructions (Signed)
Inpatient Rehab Discharge Instructions  [name] Discharge date and time: 06/07/18   Activities/Precautions/ Functional Status: Activity: no lifting, driving, or strenuous exercise for till cleared by MD Diet: regular diet Wound Care: none needed    Functional status:  ___ No restrictions     ___ Walk up steps independently _X__ 24/7 supervision/assistance   ___ Walk up steps with assistance ___ Intermittent supervision/assistance  ___ Bathe/dress independently ___ Walk with walker     _X__ Bathe/dress with assistance ___ Walk Independently    ___ Shower independently _X__ Walk with assistance    ___ Shower with assistance _X__ No alcohol     ___ Return to work/school ________   COMMUNITY REFERRALS UPON DISCHARGE:    Home Health:   PT     OT                       Agency:  [facility] Phone: [phone]   Medical Equipment/Items Ordered: wheelchair, cushion                                                      Agency/Supplier:  [facility]  Special Instructions: 1. Family needs to provide supervision with mobility to prevent injury. 2. You need follow up with neurology for your MS. You can talk to Lonie Peak about referring you to [place], [place] or [facility] depending on your preference.    My questions have been answered and I understand these instructions. I will adhere to these goals and the provided educational materials after my discharge from the hospital.  Patient/Caregiver Signature _______________________________ Date __________  Clinician Signature _______________________________________ Date __________  Please bring this form and your medication list with you to all your follow-up doctor's appointments.

## 2018-06-10 NOTE — Progress Notes (Signed)
Occupational Therapy Discharge Summary  Patient Details  Name: [name] MRN: [id number] Date of Birth: 03/18/1981    Patient has met 11 of 11 long term goals due to improved activity tolerance and ability to compensate for deficits.  Patient to discharge at overall Supervision level.  No care partner present for education. Reasons goals not met: all goals met  Recommendation:  Patient will benefit from ongoing skilled OT services in home health setting to continue to advance functional skills in the area of BADL and iADL.  Equipment: No equipment provided  Reasons for discharge: treatment goals met  Patient/family agrees with progress made and goals achieved: Yes  OT Discharge Precautions/Restrictions  Precautions Precautions: Fall Restrictions Weight Bearing Restrictions: No ADL ADL Eating: Independent Where Assessed-Eating: Bed level Grooming: Minimal assistance Where Assessed-Grooming: Standing at sink Upper Body Bathing: Minimal assistance Where Assessed-Upper Body Bathing: Shower Lower Body Bathing: Minimal assistance Where Assessed-Lower Body Bathing: Shower Upper Body Dressing: Minimal assistance Where Assessed-Upper Body Dressing: Sitting at sink Lower Body Dressing: Minimal assistance Where Assessed-Lower Body Dressing: Sitting at sink Toileting: Minimal assistance Where Assessed-Toileting: Toilet Toilet Transfer: Minimal assistance Toilet Transfer Method: Stand pivot Toilet Transfer Equipment: Grab bars Walk-In Shower Transfer: Moderate assistance Walk-In Shower Transfer Method: Stand pivot Walk-In Shower Equipment: Transfer tub bench Vision Patient Visual Report: Blurring of vision;No change from baseline Cognition Arousal/Alertness: Awake/alert Orientation Level: Oriented X4 Sensation Sensation Light Touch: Impaired by gross assessment Proprioception: Impaired by gross assessment Additional Comments: denies numbness/tingling currently, but has  intermittent issues especially with temperature changes and fatigue Coordination Gross Motor Movements are Fluid and Coordinated: No Fine Motor Movements are Fluid and Coordinated: No Coordination and Movement Description: Decresaed R LE compared to L with extensor tone and ataxic movements at times Motor  Motor Motor: Abnormal postural alignment and control;Abnormal tone;Motor impersistence;Ataxia Motor - Discharge Observations: R side weakness, coordination deficits, ataxic movements and extensor tone in LE Mobility  Bed Mobility Bed Mobility: Sit to Supine;Supine to Sit Supine to Sit: Supervision/Verbal cueing Sit to Supine: Supervision/Verbal cueing Transfers Sit to Stand: Supervision/Verbal cueing Stand to Sit: Supervision/Verbal cueing  Trunk/Postural Assessment  Cervical Assessment Cervical Assessment: Within Functional Limits Thoracic Assessment Thoracic Assessment: Within Functional Limits Lumbar Assessment Lumbar Assessment: Within Functional Limits Postural Control Postural Control: Deficits on evaluation Righting Reactions: delayed in standing  Protective Responses: delayed in standing   Balance Dynamic Sitting Balance Dynamic Sitting - Level of Assistance: 5: Stand by assistance Static Standing Balance Static Standing - Level of Assistance: 5: Stand by assistance Dynamic Standing Balance Dynamic Standing - Level of Assistance: 5: Stand by assistance Extremity/Trunk Assessment RUE Assessment RUE Assessment: Within Functional Limits LUE Assessment LUE Assessment: Within Functional Limits   ,  P 06/10/2018, 3:48 PM  

## 2018-07-06 DIAGNOSIS — G35 Multiple sclerosis: Secondary | ICD-10-CM | POA: Diagnosis not present

## 2018-08-06 DIAGNOSIS — G35 Multiple sclerosis: Secondary | ICD-10-CM | POA: Diagnosis not present

## 2018-08-23 DIAGNOSIS — M62838 Other muscle spasm: Secondary | ICD-10-CM | POA: Diagnosis not present

## 2018-08-23 DIAGNOSIS — R69 Illness, unspecified: Secondary | ICD-10-CM | POA: Diagnosis not present

## 2018-08-23 DIAGNOSIS — M2550 Pain in unspecified joint: Secondary | ICD-10-CM | POA: Diagnosis not present

## 2018-08-23 DIAGNOSIS — G35 Multiple sclerosis: Secondary | ICD-10-CM | POA: Diagnosis not present

## 2018-08-23 DIAGNOSIS — R269 Unspecified abnormalities of gait and mobility: Secondary | ICD-10-CM | POA: Diagnosis not present

## 2018-09-05 DIAGNOSIS — G35 Multiple sclerosis: Secondary | ICD-10-CM | POA: Diagnosis not present

## 2018-09-20 DIAGNOSIS — M2550 Pain in unspecified joint: Secondary | ICD-10-CM | POA: Diagnosis not present

## 2018-09-20 DIAGNOSIS — M7989 Other specified soft tissue disorders: Secondary | ICD-10-CM | POA: Diagnosis not present

## 2018-09-20 DIAGNOSIS — M62838 Other muscle spasm: Secondary | ICD-10-CM | POA: Diagnosis not present

## 2018-09-20 DIAGNOSIS — G35 Multiple sclerosis: Secondary | ICD-10-CM | POA: Diagnosis not present

## 2018-09-20 DIAGNOSIS — R269 Unspecified abnormalities of gait and mobility: Secondary | ICD-10-CM | POA: Diagnosis not present

## 2018-09-30 DIAGNOSIS — M6281 Muscle weakness (generalized): Secondary | ICD-10-CM | POA: Diagnosis not present

## 2018-09-30 DIAGNOSIS — R69 Illness, unspecified: Secondary | ICD-10-CM | POA: Diagnosis not present

## 2018-09-30 DIAGNOSIS — Z9181 History of falling: Secondary | ICD-10-CM | POA: Diagnosis not present

## 2018-09-30 DIAGNOSIS — R269 Unspecified abnormalities of gait and mobility: Secondary | ICD-10-CM | POA: Diagnosis not present

## 2018-09-30 DIAGNOSIS — I10 Essential (primary) hypertension: Secondary | ICD-10-CM | POA: Diagnosis not present

## 2018-09-30 DIAGNOSIS — E7800 Pure hypercholesterolemia, unspecified: Secondary | ICD-10-CM | POA: Diagnosis not present

## 2018-09-30 DIAGNOSIS — Z79899 Other long term (current) drug therapy: Secondary | ICD-10-CM | POA: Diagnosis not present

## 2018-09-30 DIAGNOSIS — G35 Multiple sclerosis: Secondary | ICD-10-CM | POA: Diagnosis not present

## 2018-10-02 DIAGNOSIS — Z9181 History of falling: Secondary | ICD-10-CM | POA: Diagnosis not present

## 2018-10-02 DIAGNOSIS — G35 Multiple sclerosis: Secondary | ICD-10-CM | POA: Diagnosis not present

## 2018-10-02 DIAGNOSIS — R69 Illness, unspecified: Secondary | ICD-10-CM | POA: Diagnosis not present

## 2018-10-02 DIAGNOSIS — I10 Essential (primary) hypertension: Secondary | ICD-10-CM | POA: Diagnosis not present

## 2018-10-02 DIAGNOSIS — M6281 Muscle weakness (generalized): Secondary | ICD-10-CM | POA: Diagnosis not present

## 2018-10-02 DIAGNOSIS — Z79899 Other long term (current) drug therapy: Secondary | ICD-10-CM | POA: Diagnosis not present

## 2018-10-02 DIAGNOSIS — E7800 Pure hypercholesterolemia, unspecified: Secondary | ICD-10-CM | POA: Diagnosis not present

## 2018-10-02 DIAGNOSIS — R269 Unspecified abnormalities of gait and mobility: Secondary | ICD-10-CM | POA: Diagnosis not present

## 2018-10-06 DIAGNOSIS — G35 Multiple sclerosis: Secondary | ICD-10-CM | POA: Diagnosis not present

## 2018-10-08 DIAGNOSIS — R69 Illness, unspecified: Secondary | ICD-10-CM | POA: Diagnosis not present

## 2018-10-08 DIAGNOSIS — E7800 Pure hypercholesterolemia, unspecified: Secondary | ICD-10-CM | POA: Diagnosis not present

## 2018-10-08 DIAGNOSIS — Z9181 History of falling: Secondary | ICD-10-CM | POA: Diagnosis not present

## 2018-10-08 DIAGNOSIS — R269 Unspecified abnormalities of gait and mobility: Secondary | ICD-10-CM | POA: Diagnosis not present

## 2018-10-08 DIAGNOSIS — M6281 Muscle weakness (generalized): Secondary | ICD-10-CM | POA: Diagnosis not present

## 2018-10-08 DIAGNOSIS — G35 Multiple sclerosis: Secondary | ICD-10-CM | POA: Diagnosis not present

## 2018-10-08 DIAGNOSIS — Z79899 Other long term (current) drug therapy: Secondary | ICD-10-CM | POA: Diagnosis not present

## 2018-10-08 DIAGNOSIS — I10 Essential (primary) hypertension: Secondary | ICD-10-CM | POA: Diagnosis not present

## 2018-10-11 DIAGNOSIS — G35 Multiple sclerosis: Secondary | ICD-10-CM | POA: Diagnosis not present

## 2018-10-11 DIAGNOSIS — R269 Unspecified abnormalities of gait and mobility: Secondary | ICD-10-CM | POA: Diagnosis not present

## 2018-10-11 DIAGNOSIS — Z9181 History of falling: Secondary | ICD-10-CM | POA: Diagnosis not present

## 2018-10-11 DIAGNOSIS — Z79899 Other long term (current) drug therapy: Secondary | ICD-10-CM | POA: Diagnosis not present

## 2018-10-11 DIAGNOSIS — E7800 Pure hypercholesterolemia, unspecified: Secondary | ICD-10-CM | POA: Diagnosis not present

## 2018-10-11 DIAGNOSIS — I10 Essential (primary) hypertension: Secondary | ICD-10-CM | POA: Diagnosis not present

## 2018-10-11 DIAGNOSIS — M6281 Muscle weakness (generalized): Secondary | ICD-10-CM | POA: Diagnosis not present

## 2018-10-11 DIAGNOSIS — R69 Illness, unspecified: Secondary | ICD-10-CM | POA: Diagnosis not present

## 2018-10-15 DIAGNOSIS — Z79899 Other long term (current) drug therapy: Secondary | ICD-10-CM | POA: Diagnosis not present

## 2018-10-15 DIAGNOSIS — R269 Unspecified abnormalities of gait and mobility: Secondary | ICD-10-CM | POA: Diagnosis not present

## 2018-10-15 DIAGNOSIS — M6281 Muscle weakness (generalized): Secondary | ICD-10-CM | POA: Diagnosis not present

## 2018-10-15 DIAGNOSIS — Z9181 History of falling: Secondary | ICD-10-CM | POA: Diagnosis not present

## 2018-10-15 DIAGNOSIS — E7800 Pure hypercholesterolemia, unspecified: Secondary | ICD-10-CM | POA: Diagnosis not present

## 2018-10-15 DIAGNOSIS — I10 Essential (primary) hypertension: Secondary | ICD-10-CM | POA: Diagnosis not present

## 2018-10-15 DIAGNOSIS — R69 Illness, unspecified: Secondary | ICD-10-CM | POA: Diagnosis not present

## 2018-10-15 DIAGNOSIS — G35 Multiple sclerosis: Secondary | ICD-10-CM | POA: Diagnosis not present

## 2018-10-18 DIAGNOSIS — Z9181 History of falling: Secondary | ICD-10-CM | POA: Diagnosis not present

## 2018-10-18 DIAGNOSIS — Z79899 Other long term (current) drug therapy: Secondary | ICD-10-CM | POA: Diagnosis not present

## 2018-10-18 DIAGNOSIS — E7800 Pure hypercholesterolemia, unspecified: Secondary | ICD-10-CM | POA: Diagnosis not present

## 2018-10-18 DIAGNOSIS — M6281 Muscle weakness (generalized): Secondary | ICD-10-CM | POA: Diagnosis not present

## 2018-10-18 DIAGNOSIS — R69 Illness, unspecified: Secondary | ICD-10-CM | POA: Diagnosis not present

## 2018-10-18 DIAGNOSIS — G35 Multiple sclerosis: Secondary | ICD-10-CM | POA: Diagnosis not present

## 2018-10-18 DIAGNOSIS — R269 Unspecified abnormalities of gait and mobility: Secondary | ICD-10-CM | POA: Diagnosis not present

## 2018-10-18 DIAGNOSIS — I10 Essential (primary) hypertension: Secondary | ICD-10-CM | POA: Diagnosis not present

## 2018-10-22 DIAGNOSIS — E7800 Pure hypercholesterolemia, unspecified: Secondary | ICD-10-CM | POA: Diagnosis not present

## 2018-10-22 DIAGNOSIS — R69 Illness, unspecified: Secondary | ICD-10-CM | POA: Diagnosis not present

## 2018-10-22 DIAGNOSIS — Z9181 History of falling: Secondary | ICD-10-CM | POA: Diagnosis not present

## 2018-10-22 DIAGNOSIS — Z79899 Other long term (current) drug therapy: Secondary | ICD-10-CM | POA: Diagnosis not present

## 2018-10-22 DIAGNOSIS — G35 Multiple sclerosis: Secondary | ICD-10-CM | POA: Diagnosis not present

## 2018-10-22 DIAGNOSIS — R269 Unspecified abnormalities of gait and mobility: Secondary | ICD-10-CM | POA: Diagnosis not present

## 2018-10-22 DIAGNOSIS — M6281 Muscle weakness (generalized): Secondary | ICD-10-CM | POA: Diagnosis not present

## 2018-10-22 DIAGNOSIS — I10 Essential (primary) hypertension: Secondary | ICD-10-CM | POA: Diagnosis not present

## 2018-11-05 DIAGNOSIS — G35 Multiple sclerosis: Secondary | ICD-10-CM | POA: Diagnosis not present

## 2018-12-06 DIAGNOSIS — G35 Multiple sclerosis: Secondary | ICD-10-CM | POA: Diagnosis not present

## 2019-08-20 IMAGING — MR MR CERVICAL SPINE WO/W CM
19 of 24 series · 30 of 48 positions shown · IV contrast (multihance)
Comparison: None.

CLINICAL DATA: Multiple sclerosis.

EXAM:
MRI HEAD WITHOUT AND WITH CONTRAST
MRI CERVICAL SPINE WITHOUT AND WITH CONTRAST
TECHNIQUE: Multiplanar, multiecho pulse sequences of the brain and surrounding
structures, and cervical spine, to include the craniocervical
junction and cervicothoracic junction, were obtained without and
with intravenous contrast.
CONTRAST:  15mL MULTIHANCE GADOBENATE DIMEGLUMINE 529 MG/ML IV SOLN

[Series 5: ax dwi_tracew · axial · 3.0mm · 1.50mm/px · z∈[-95,+35]mm · 2 of 80 slices shown]
[im 1/80]
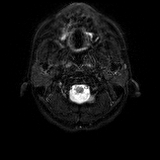
[im 80/80]
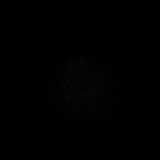

[Series 6: ax dwi_adc · axial · 3.0mm · 1.50mm/px · 1 of 37 slices shown]
[im 1/37]
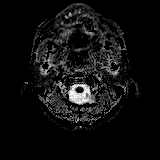

[Series 7: cor dwi_tracew · coronal · 5.0mm · 1.44mm/px · 2 of 70 slices shown]
[im 1/70]
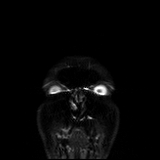
[im 70/70]
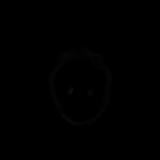

[Series 8: cor dwi_adc · coronal · 5.0mm · 1.44mm/px · 1 of 35 slices shown]
[im 1/35]
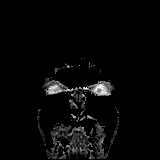

[Series 9: T1 · sagittal · 5.0mm · 0.75mm/px · 1 of 24 slices shown (1 of 3)]
[im 1/24]
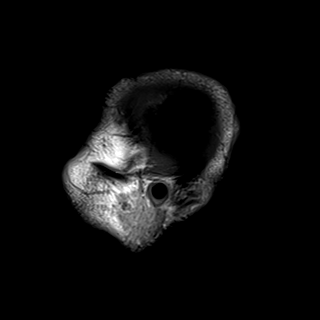

[Series 10: T2 · axial · 5.0mm · 0.72mm/px · 1 of 25 slices shown (1 of 3)]
[im 1/25]
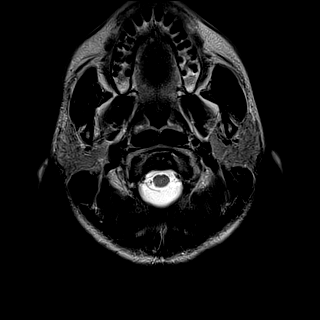

[Series 11: FLAIR · axial · 5.0mm · 0.45mm/px · 1 of 25 slices shown]
[im 1/25]
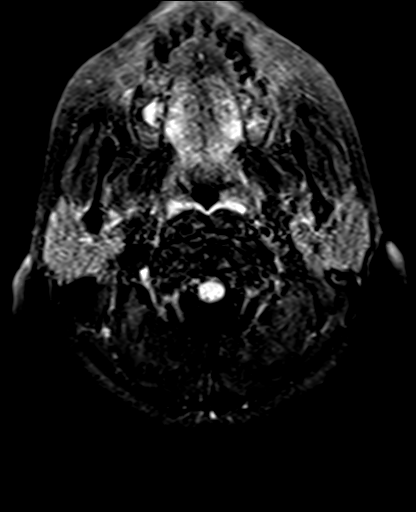

[Series 12: swi_images · axial · 3.0mm · 0.90mm/px · z∈[-108,+55]mm · 2 of 60 slices shown]
[im 1/60]
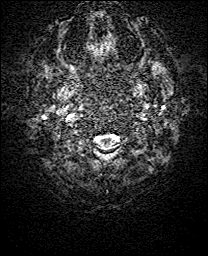
[im 60/60]
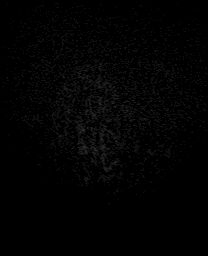

[Series 14: t2_space_dark-fluid_sag_p2_ns-ir · sagittal · 1.0mm · 0.49mm/px · 6 of 192 slices shown]
[im 1/192]
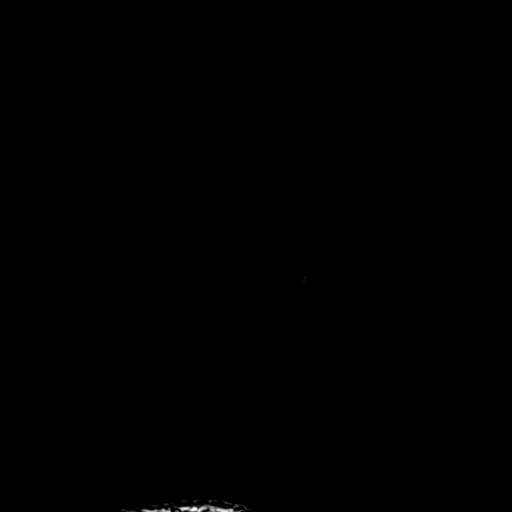
[im 28/192]
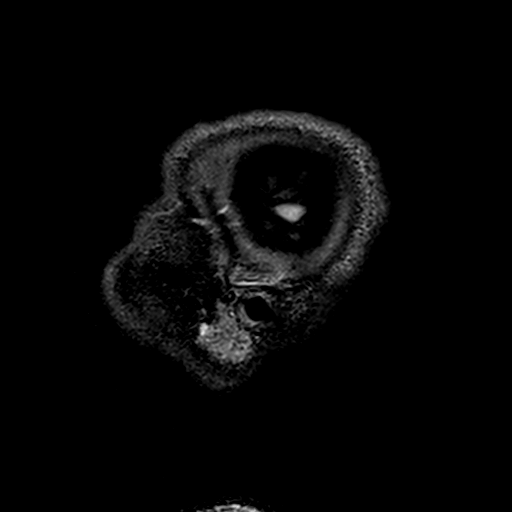
[im 55/192]
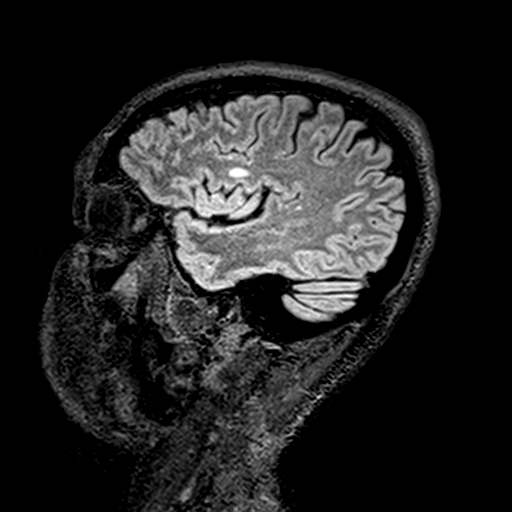
[im 82/192]
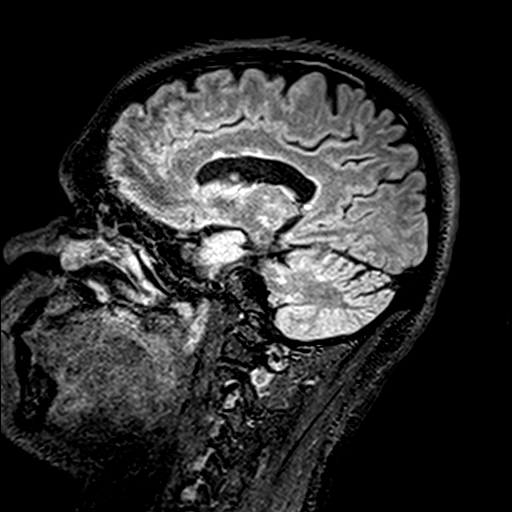
[im 110/192]
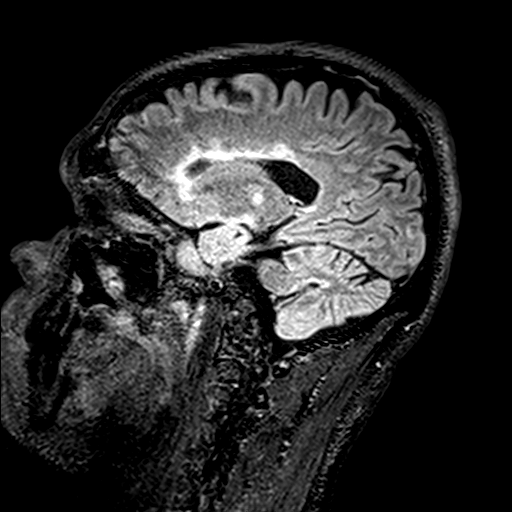
[im 137/192]
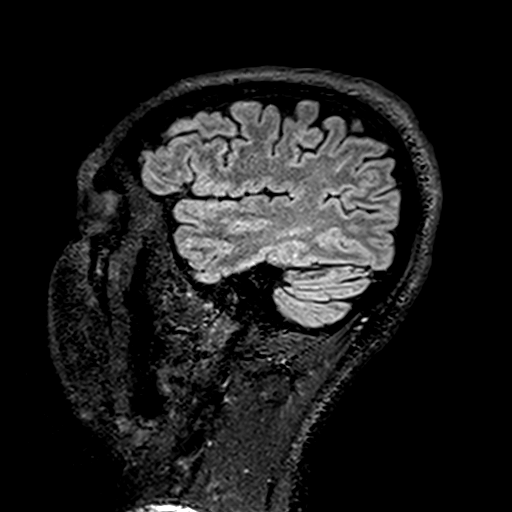

[Series 18: T2 post-contrast · coronal · 5.0mm · 0.72mm/px · 1 of 28 slices shown]
[im 1/28]
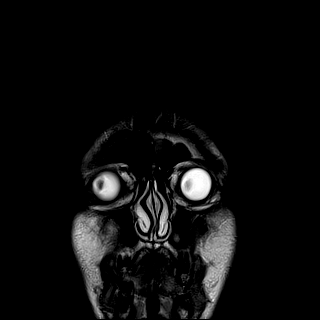

[Series 23: T2 · sagittal · 3.0mm · 0.69mm/px · 1 of 15 slices shown (2 of 3)]
[im 1/15]
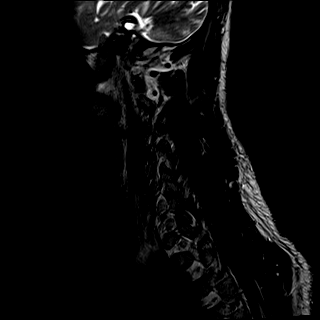

[Series 24: T1 · sagittal · 3.0mm · 0.69mm/px · 1 of 15 slices shown (2 of 3)]
[im 1/15]
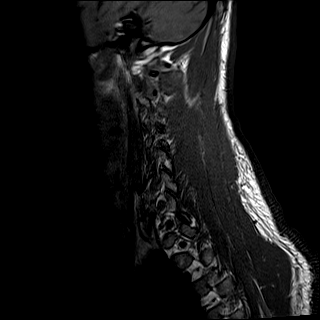

[Series 25: STIR · sagittal · 3.0mm · 0.86mm/px · 1 of 15 slices shown]
[im 1/15]
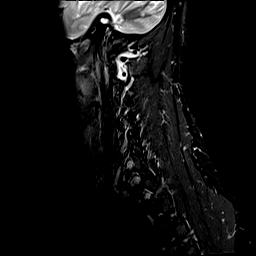

[Series 26: T2 · axial · 3.0mm · 0.66mm/px · z∈[-215,-90]mm · 2 of 40 slices shown (3 of 3)]
[im 1/40]
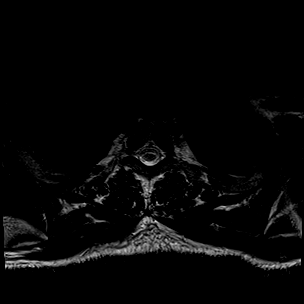
[im 40/40]
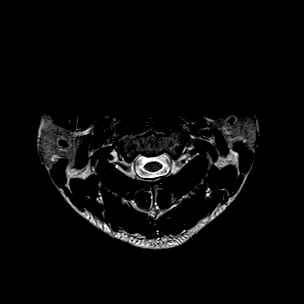

[Series 28: T1 · axial · 3.0mm · 0.39mm/px · z∈[-215,-90]mm · 2 of 40 slices shown (3 of 3)]
[im 1/40]
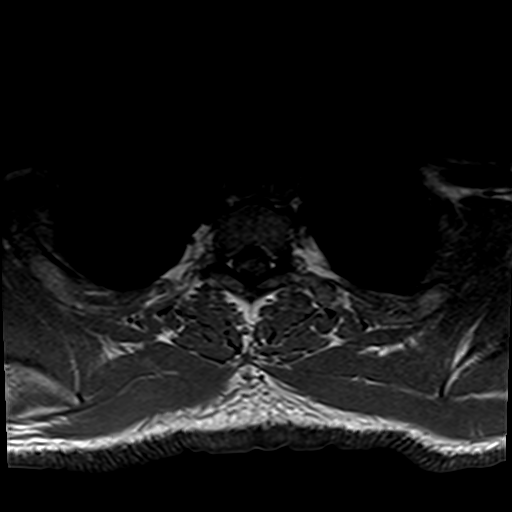
[im 40/40]
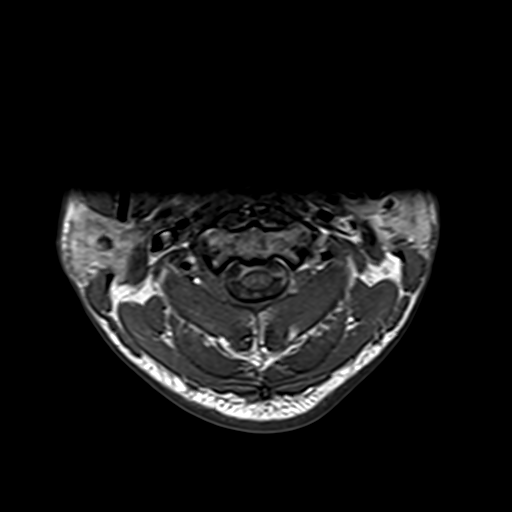

[Series 29: T1 fat-sat post-contrast · sagittal · 3.0mm · 0.43mm/px · 1 of 15 slices shown]
[im 1/15]
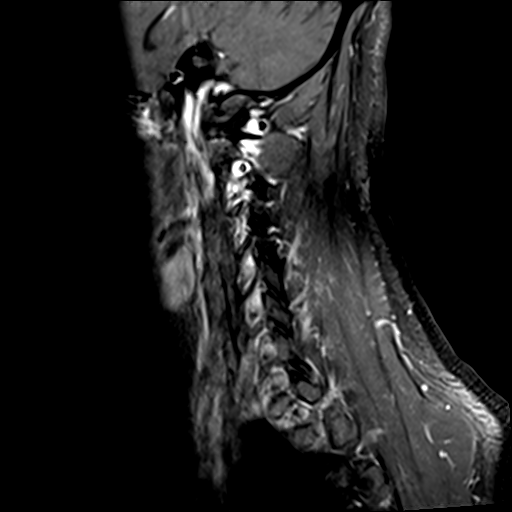

[Series 30: T1 post-contrast · axial · 3.0mm · 0.39mm/px · z∈[-215,-90]mm · 2 of 40 slices shown (1 of 3)]
[im 1/40]
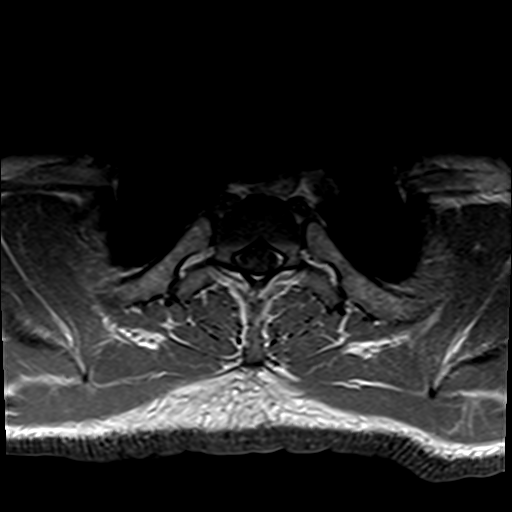
[im 40/40]
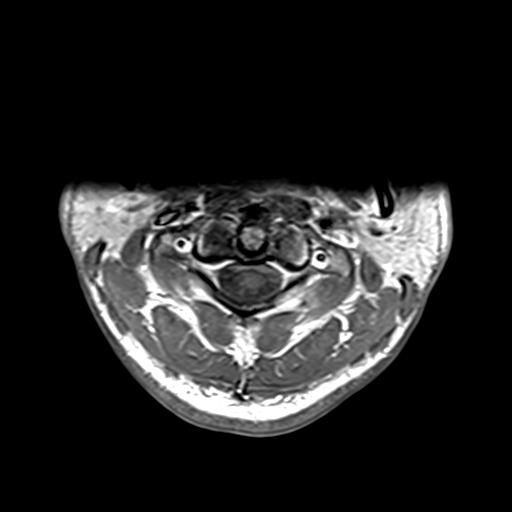

[Series 32: T1 post-contrast · coronal · 5.0mm · 0.34mm/px · 1 of 28 slices shown (2 of 3)]
[im 1/28]
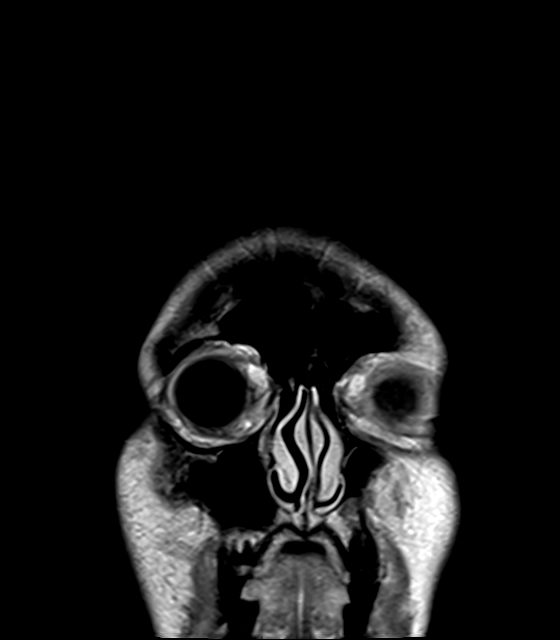

[Series 33: T1 post-contrast · sagittal · 5.0mm · 0.72mm/px · 1 of 24 slices shown (3 of 3)]
[im 1/24]
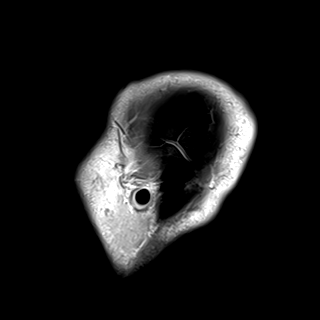

[30 of 48 positions shown; findings below may reference images not displayed]

FINDINGS: MRI HEAD FINDINGS

BRAIN: There is no acute infarct, acute hemorrhage or mass effect.
The midline structures are normal. There are numerous (20 or more)
hyperintense T2-weighted signal lesions within the periventricular
and juxtacortical white matter, 10-20 of which show corresponding
low T1-weighted signal. There are multiple infratentorial lesions,
including 7-10 lesions of the brainstem and middle cerebellar
peduncles.. The number, size and distribution of lesions is
unchanged from the most recent MRI. There are no contrast-enhancing
lesions to indicate active demyelination. There is age advanced
atrophy. Susceptibility-sensitive sequences show no chronic
microhemorrhage or superficial siderosis.

VASCULAR: Major intracranial arterial and venous sinus flow voids
are preserved.

SKULL: The visualized skull base, calvarium, and extracranial soft
tissues are normal.

SINUSES/ORBITS: No fluid levels or advanced mucosal thickening. No
mastoid or middle ear effusion. The orbits are normal.

MRI CERVICAL SPINE FINDINGS

Alignment: Normal.

Vertebrae: No focal marrow lesion. No compression fracture or
evidence of discitis osteomyelitis.

Cord: Multiple hyperintense T2-weighted signal lesions of the spinal
cord redemonstrated. Dorsal lesion at the C2-3 level is unchanged.
Right eccentric lesion at the C4-5 level is also unchanged. Left
eccentric lesions at C5 and C6 are also unchanged. No
contrast-enhancing lesions.

Posterior Fossa, vertebral arteries, paraspinal tissues: No
paraspinal abnormality or prevertebral effusion.

Disc levels:

The C1-2 articulation is normal.

The C2-T2 levels are normal.

T2-T3 is imaged in the sagittal plane only, with no visible disc
herniation or stenosis.
IMPRESSION: 1. Findings of severe multiple sclerosis with numerous brain and
cervical spine white matter lesions in unchanged distribution
compared to 04/11/2017.
2. No contrast-enhancing lesions to indicate active demyelination.
3. Advanced brain atrophy for age.

## 2021-01-24 DIAGNOSIS — R519 Headache, unspecified: Secondary | ICD-10-CM | POA: Diagnosis not present

## 2021-01-24 DIAGNOSIS — G35 Multiple sclerosis: Secondary | ICD-10-CM | POA: Diagnosis not present

## 2021-01-24 DIAGNOSIS — R5383 Other fatigue: Secondary | ICD-10-CM | POA: Diagnosis not present

## 2021-01-24 DIAGNOSIS — Z87891 Personal history of nicotine dependence: Secondary | ICD-10-CM | POA: Diagnosis not present

## 2021-01-24 DIAGNOSIS — K047 Periapical abscess without sinus: Secondary | ICD-10-CM | POA: Diagnosis not present

## 2021-01-24 DIAGNOSIS — M272 Inflammatory conditions of jaws: Secondary | ICD-10-CM | POA: Diagnosis not present

## 2021-01-24 DIAGNOSIS — R599 Enlarged lymph nodes, unspecified: Secondary | ICD-10-CM | POA: Diagnosis not present

## 2021-11-07 ENCOUNTER — Emergency Department (HOSPITAL_COMMUNITY): Payer: Medicare HMO

## 2021-11-07 ENCOUNTER — Observation Stay (HOSPITAL_COMMUNITY): Payer: Medicare HMO

## 2021-11-07 ENCOUNTER — Encounter (HOSPITAL_COMMUNITY): Payer: Self-pay | Admitting: Emergency Medicine

## 2021-11-07 ENCOUNTER — Other Ambulatory Visit: Payer: Self-pay

## 2021-11-07 ENCOUNTER — Inpatient Hospital Stay (HOSPITAL_COMMUNITY)
Admission: EM | Admit: 2021-11-07 | Discharge: 2021-11-16 | DRG: 060 | Disposition: A | Discharge status: Skilled Nursing Facility | Payer: Medicare HMO | Attending: Internal Medicine | Admitting: Internal Medicine

## 2021-11-07 DIAGNOSIS — M6281 Muscle weakness (generalized): Secondary | ICD-10-CM | POA: Diagnosis not present

## 2021-11-07 DIAGNOSIS — F17210 Nicotine dependence, cigarettes, uncomplicated: Secondary | ICD-10-CM | POA: Diagnosis not present

## 2021-11-07 DIAGNOSIS — F339 Major depressive disorder, recurrent, unspecified: Secondary | ICD-10-CM | POA: Diagnosis not present

## 2021-11-07 DIAGNOSIS — Z560 Unemployment, unspecified: Secondary | ICD-10-CM

## 2021-11-07 DIAGNOSIS — I10 Essential (primary) hypertension: Secondary | ICD-10-CM | POA: Diagnosis not present

## 2021-11-07 DIAGNOSIS — R2681 Unsteadiness on feet: Secondary | ICD-10-CM | POA: Diagnosis not present

## 2021-11-07 DIAGNOSIS — R471 Dysarthria and anarthria: Secondary | ICD-10-CM | POA: Diagnosis present

## 2021-11-07 DIAGNOSIS — M792 Neuralgia and neuritis, unspecified: Secondary | ICD-10-CM | POA: Diagnosis not present

## 2021-11-07 DIAGNOSIS — Z820 Family history of epilepsy and other diseases of the nervous system: Secondary | ICD-10-CM | POA: Diagnosis not present

## 2021-11-07 DIAGNOSIS — E119 Type 2 diabetes mellitus without complications: Secondary | ICD-10-CM | POA: Diagnosis present

## 2021-11-07 DIAGNOSIS — Z79899 Other long term (current) drug therapy: Secondary | ICD-10-CM

## 2021-11-07 DIAGNOSIS — R531 Weakness: Secondary | ICD-10-CM | POA: Diagnosis not present

## 2021-11-07 DIAGNOSIS — Z91148 Patient's other noncompliance with medication regimen for other reason: Secondary | ICD-10-CM

## 2021-11-07 DIAGNOSIS — R41841 Cognitive communication deficit: Secondary | ICD-10-CM | POA: Diagnosis not present

## 2021-11-07 DIAGNOSIS — Z751 Person awaiting admission to adequate facility elsewhere: Secondary | ICD-10-CM | POA: Diagnosis not present

## 2021-11-07 DIAGNOSIS — Z8249 Family history of ischemic heart disease and other diseases of the circulatory system: Secondary | ICD-10-CM | POA: Diagnosis not present

## 2021-11-07 DIAGNOSIS — R001 Bradycardia, unspecified: Secondary | ICD-10-CM | POA: Diagnosis present

## 2021-11-07 DIAGNOSIS — G35 Multiple sclerosis: Principal | ICD-10-CM | POA: Diagnosis present

## 2021-11-07 DIAGNOSIS — F17200 Nicotine dependence, unspecified, uncomplicated: Secondary | ICD-10-CM | POA: Diagnosis not present

## 2021-11-07 DIAGNOSIS — Z7401 Bed confinement status: Secondary | ICD-10-CM | POA: Diagnosis not present

## 2021-11-07 DIAGNOSIS — G9389 Other specified disorders of brain: Secondary | ICD-10-CM | POA: Diagnosis not present

## 2021-11-07 DIAGNOSIS — M62838 Other muscle spasm: Secondary | ICD-10-CM | POA: Diagnosis not present

## 2021-11-07 LAB — CBC WITH DIFFERENTIAL/PLATELET
Abs Immature Granulocytes: 0.01 10*3/uL (ref 0.00–0.07)
Basophils Absolute: 0.1 10*3/uL (ref 0.0–0.1)
Basophils Relative: 1 %
Eosinophils Absolute: 0.1 10*3/uL (ref 0.0–0.5)
Eosinophils Relative: 1 %
HCT: 42.1 % (ref 39.0–52.0)
Hemoglobin: 13.9 g/dL (ref 13.0–17.0)
Immature Granulocytes: 0 %
Lymphocytes Relative: 20 %
Lymphs Abs: 0.9 10*3/uL (ref 0.7–4.0)
MCH: 28.8 pg (ref 26.0–34.0)
MCHC: 33 g/dL (ref 30.0–36.0)
MCV: 87.2 fL (ref 80.0–100.0)
Monocytes Absolute: 0.3 10*3/uL (ref 0.1–1.0)
Monocytes Relative: 8 %
Neutro Abs: 3 10*3/uL (ref 1.7–7.7)
Neutrophils Relative %: 70 %
Platelets: 249 10*3/uL (ref 150–400)
RBC: 4.83 MIL/uL (ref 4.22–5.81)
RDW: 13 % (ref 11.5–15.5)
WBC: 4.3 10*3/uL (ref 4.0–10.5)
nRBC: 0 % (ref 0.0–0.2)

## 2021-11-07 LAB — URINALYSIS, ROUTINE W REFLEX MICROSCOPIC
Bilirubin Urine: NEGATIVE
Glucose, UA: NEGATIVE mg/dL
Hgb urine dipstick: NEGATIVE
Ketones, ur: 20 mg/dL — AB
Leukocytes,Ua: NEGATIVE
Nitrite: NEGATIVE
Protein, ur: 30 mg/dL — AB
Specific Gravity, Urine: 1.032 — ABNORMAL HIGH (ref 1.005–1.030)
pH: 5 (ref 5.0–8.0)

## 2021-11-07 LAB — CBG MONITORING, ED: Glucose-Capillary: 79 mg/dL (ref 70–99)

## 2021-11-07 LAB — ETHANOL: Alcohol, Ethyl (B): <10 mg/dL (ref <10)

## 2021-11-07 LAB — BASIC METABOLIC PANEL
Anion gap: 10 (ref 5–15)
BUN: 10 mg/dL (ref 6–20)
CO2: 24 mmol/L (ref 22–32)
Calcium: 9.4 mg/dL (ref 8.9–10.3)
Chloride: 107 mmol/L (ref 98–111)
Creatinine, Ser: 0.96 mg/dL (ref 0.61–1.24)
GFR, Estimated: >60 mL/min (ref >60)
Glucose, Bld: 107 mg/dL — ABNORMAL HIGH (ref 70–99)
Potassium: 3.8 mmol/L (ref 3.5–5.1)
Sodium: 141 mmol/L (ref 135–145)

## 2021-11-07 MED ORDER — LORAZEPAM 2 MG/ML IJ SOLN
2.0000 mg | INTRAMUSCULAR | Status: DC | PRN
  Administered 2021-11-07 – 2021-11-08 (×2): 2 mg via INTRAVENOUS
  Filled 2021-11-07 (×2): qty 1

## 2021-11-07 MED ORDER — LORAZEPAM 2 MG/ML IJ SOLN
1.0000 mg | Freq: Once | INTRAMUSCULAR | Status: DC | PRN
  Filled 2021-11-07: qty 1

## 2021-11-07 MED ORDER — INSULIN ASPART 100 UNIT/ML IJ SOLN
0.0000 [IU] | Freq: Three times a day (TID) | INTRAMUSCULAR | Status: DC
  Administered 2021-11-08 (×2): 2 [IU] via SUBCUTANEOUS
  Administered 2021-11-08: 1 [IU] via SUBCUTANEOUS
  Administered 2021-11-09 (×2): 2 [IU] via SUBCUTANEOUS
  Administered 2021-11-09 – 2021-11-10 (×3): 1 [IU] via SUBCUTANEOUS

## 2021-11-07 MED ORDER — LORAZEPAM 2 MG/ML IJ SOLN: 1.0000 mg | INTRAMUSCULAR | Status: DC | PRN

## 2021-11-07 MED ORDER — ENOXAPARIN SODIUM 40 MG/0.4ML IJ SOSY
40.0000 mg | PREFILLED_SYRINGE | INTRAMUSCULAR | Status: DC
  Administered 2021-11-07 – 2021-11-15 (×9): 40 mg via SUBCUTANEOUS
  Filled 2021-11-07 (×9): qty 0.4

## 2021-11-07 MED ORDER — SODIUM CHLORIDE 0.9 % IV SOLN
1000.0000 mg | Freq: Every day | INTRAVENOUS | Status: AC
Start: 2021-11-07 — End: 2021-11-11
  Administered 2021-11-07 – 2021-11-11 (×5): 1000 mg via INTRAVENOUS
  Filled 2021-11-07: qty 8
  Filled 2021-11-07 (×4): qty 16

## 2021-11-07 NOTE — Hospital Course (Addendum)
MS, noncompliant for the past 2 years. No neurologist.  1-2 weeks ago, flare-up. Teeth hurt, weakness  Getting MRI IV steroids    Feels okay today. Feels hungry, but currently not able to eat due to MRI this afternoon. Speech is about the same. Strength is about the same as well.

## 2021-11-07 NOTE — ED Notes (Signed)
Pt taken to MRI  

## 2021-11-07 NOTE — Consult Note (Signed)
Neurology Consultation  Reason for Consult: weakness, concern for MS exacerbation Referring Physician:  Dr Charm Barges  CC: Worsening lower extremity weakness, urinary incontinence  History is obtained from: Patient, chart  HPI: [name] is a 41 y.o. male past medical history of relapsing remitting MS, at one point was on Tysabri and then on [place], not been on disease modifying therapy for many years now comes in for evaluation of worsening of gait, weakness in lower extremities and difficulty with speech.  Also reports urinary incontinence. Reports that over the past few days to weeks he has had trouble walking on his legs and not able to support his weight.  His speech is also become very slurred and he is having difficulty swallowing as well.  He also reports that he has had a few episodes of urinary incontinence-could not tell me exactly if he was not able to get to the bathroom due to weakness or if he had no sensation and had to incontinence. I was called over the phone for recommendations on imaging modality.  Review of his chart shows brain C and T-spine involvement with demyelinating lesions-I recommended a repeat brain C and T-spine but he was unable to lay still on the scanner and scans will be tried again with some Ativan.  Lives at home.  Has a 60 year old daughter and her 66 year old son.  The daughter takes care of him and his son.  ROS: Full ROS was performed and is negative except as noted in the HPI.   Past Medical History:  Diagnosis Date   Depression    Diabetes mellitus without complication (HCC)    Eye abnormalities    unspecified by pt   Hypertension    Multiple sclerosis (HCC)    Weakness    generalized     Family History  Adopted: Yes  Problem Relation Age of Onset   Multiple sclerosis Cousin    Heart disease Mother    Other Father        Does not know his father.     Social History:   reports that he has been smoking cigarettes. He has been  smoking an average of 1 pack per day. He has never used smokeless tobacco. He reports current alcohol use of about 12.0 standard drinks of alcohol per week. He reports current drug use. Frequency: 30.00 times per week. Drug: Marijuana.  Medications  Current Facility-Administered Medications:    LORazepam (ATIVAN) injection 1 mg, 1 mg, Intravenous, PRN, Janell Quiet, PA-C  Current Outpatient Medications:    acetaminophen (TYLENOL) 325 MG tablet, Take 1-2 tablets (325-650 mg total) by mouth every 4 (four) hours as needed for mild pain., Disp: , Rfl:    pantoprazole (PROTONIX) 40 MG tablet, Take 1 tablet (40 mg total) by mouth daily., Disp: 30 tablet, Rfl: 0   Exam: Current vital signs: BP (!) 143/85   Pulse (!) 48   Temp 98 F (36.7 C) (Oral)   Resp 18   SpO2 100%  Vital signs in last 24 hours: Temp:  [98 F (36.7 C)-98.5 F (36.9 C)] 98 F (36.7 C) (07/10 0811) Pulse Rate:  [48-68] 48 (07/10 1215) Resp:  [15-18] 18 (07/10 1136) BP: (117-143)/(62-88) 143/85 (07/10 1215) SpO2:  [98 %-100 %] 100 % (07/10 1215)  General: Awake alert in no distress, sitting in bed smelling of urine HEENT: Normocephalic atraumatic Lungs: Clear CVS: Regular rhythm Abdomen nondistended nontender Extremities warm well perfused Neurologic exam Awake alert oriented x3 Speech is moderately  scanning in quality No evidence of aphasia Cranial nerves: Pupils equal round react light, extra movements intact, visual fields full, face symmetric, tongue and palate midline. Motor examination with no drift in the upper extremities.  Lower extremities are antigravity with drift-3-4/5. Sensation intact DTRs: Brisk in the lower extremities.   Labs I have reviewed labs in epic and the results pertinent to this consultation are:  CBC    Component Value Date/Time   WBC 4.3 11/07/2021 0730   RBC 4.83 11/07/2021 0730   HGB 13.9 11/07/2021 0730   HGB 13.6 07/26/2016 1005   HCT 42.1 11/07/2021 0730   HCT  41.9 07/26/2016 1005   PLT 249 11/07/2021 0730   MCV 87.2 11/07/2021 0730   MCV 88 07/26/2016 1005   MCH 28.8 11/07/2021 0730   MCHC 33.0 11/07/2021 0730   RDW 13.0 11/07/2021 0730   RDW 15.1 07/26/2016 1005   LYMPHSABS 0.9 11/07/2021 0730   LYMPHSABS 2.1 07/26/2016 1005   MONOABS 0.3 11/07/2021 0730   EOSABS 0.1 11/07/2021 0730   EOSABS 0.1 07/26/2016 1005   BASOSABS 0.1 11/07/2021 0730   BASOSABS 0.0 07/26/2016 1005    CMP     Component Value Date/Time   NA 141 11/07/2021 0730   NA 142 07/26/2016 1005   K 3.8 11/07/2021 0730   CL 107 11/07/2021 0730   CO2 24 11/07/2021 0730   GLUCOSE 107 (H) 11/07/2021 0730   BUN 10 11/07/2021 0730   BUN 10 07/26/2016 1005   CREATININE 0.96 11/07/2021 0730   CALCIUM 9.4 11/07/2021 0730   PROT 6.0 (L) 06/01/2018 0649   PROT 7.1 07/26/2016 1005   ALBUMIN 2.8 (L) 06/01/2018 0649   ALBUMIN 4.6 07/26/2016 1005   AST 40 06/01/2018 0649   ALT 50 (H) 06/01/2018 0649   ALKPHOS 39 06/01/2018 0649   BILITOT 0.4 06/01/2018 0649   BILITOT 0.5 07/26/2016 1005   GFRNONAA >60 11/07/2021 0730   GFRAA >60 06/05/2018 0940   Imaging I have reviewed the images obtained: Last MRI brain cervical and thoracic spine is from 2020 with comparison from from 2019 which shows findings consistent with MS without any abnormal enhancement.  Today studies ordered and pending  Assessment: 41 year old with relapsing remitting MS, was on Tysabri at 1 point and lastly on [place] but did not elect to continue treatment with disease modifying agents for the past 4 to 5 years presenting for progressive gait instability, lower extremity weakness and now difficulty with speech, swallowing as well as urinary incontinence. Looks like his MS is becoming more progressive and might get to the progressive relapsing phase rather than truly relapsing remitting. At this point, it might not be unreasonable to treat him with steroids irrespective of whether he has abnormal enhancement  on scans or not. Eventually he will need outpatient MS specialist for long-term disease modifying therapy to manage his multiple sclerosis.  Impression: Possible MS exacerbation   Recommendations: MR brain, cervical and T spine with and without contrast UA, CXR If urinalysis and chest x-ray negative for infection, I would start him on Solu-Medrol 1 g daily for 5 days. He needs to be counseled by multiple team members to see outpatient MI specialist neurologist for disease modifying treatment.  I reiterated this but it looks like he is not in favor of taking medications.  I discussed that if he does not remain on treatment, he might keep getting worse.  I will continue this conversation as I continue to follow him with you over  the next few days.  Plan was discussed with Dr. Charm Barges.  -- Milon Dikes, MD Neurologist [facility] Pager: [phone]

## 2021-11-07 NOTE — ED Notes (Signed)
Patient transported to MRI 

## 2021-11-07 NOTE — H&P (Cosign Needed Addendum)
Date: 11/07/2021               Patient Name:  [name] MRN: [id number]  DOB: 08-08-1980 Age / Sex: 41 y.o., male   PCP: Lonie Peak, PA-C         Medical Service: Internal Medicine Teaching Service         Attending Physician: Dr. Dickie La, MD    First Contact: Adron Bene, MD Pager: Myriam Jacobson [phone]  Second Contact:  Merrilyn Puma, MD Pager:  Nada Maclachlan [phone]       After Hours (After 5p/  First Contact Pager: [phone]  weekends / holidays): Second Contact Pager: [phone]   SUBJECTIVE   Chief Complaint:  lower extremity weakness, speech difficulty  History of Present Illness: [name] is a 41 yo male with PMHx of relapsing remitting MS and HTN who presented with lower extremity weakness, speech difficulty, and urinary incontinence. He was previously treated with Tysabri and more recently [place]. He states he had the first [place] injection 2.5 years ago but not the second. States [name] led to him being in the hospital but shakes his head when asked about if/how it made him sick. He has not been on any disease modifying therapy since then. His symptoms became noticeable 1.5 weeks ago and began with weakness in his right lower extremity with difficulty walking. He states he's fallen 14 to 15 times in the past 2 weeks due to imbalance. He states that a few days after lower leg weakness began, his speech began to become slurred. He does report urinary incontinence but it is hard to discern if this is not simply his inability to get to the bathroom in time. Weakness at presentation includes right and left lower extremities as well as right upper extremity.   Endorses good appetite and no trouble eating in recent days. He denies vision changes currently but does state his vision seems to change every year with the weather - summer heat, winter cold, rain. Denies any recent fevers or chills, diarrhea, abdominal pain, headache.  He states he has seen neurology in [place] but has not been  followed recently. Last admitted for MS flare in 2020.  ED Course: Vitals stable with no acute lab findings (normal electrolytes, WBC wnl, UA w/o signs of UTI). Unable to undergo MRI brain/cervical/T spine due to inability to stay still. Given Ativan 1 mg and waiting for scanner to open up. CXR unremarkable. Seen by neurology who recommends IV steroids for MS flare pending CXR and UA.  Meds:  No outpatient medications have been marked as taking for the 11/07/21 encounter [facility] Encounter).    Past Medical History:  Diagnosis Date   Depression    Diabetes mellitus without complication (HCC)    Eye abnormalities    unspecified by pt   Hypertension    Multiple sclerosis (HCC)    Weakness    generalized    Past Surgical History:  Procedure Laterality Date   WISDOM TOOTH EXTRACTION      Social:  Lives With: two children Occupation: unemployed Support: family Level of Function: performs ADLs at baseline PCP: Lonie Peak, PA-C Substances:  -Alcohol 12 cans of beer / week -marijuana 30 times / week -tobacco, 1ppd  Family History:  -mother - heart disease  -cousin - multiple sclerosis  Allergies: Allergies as of 11/07/2021   (No Known Allergies)    Review of Systems: A complete ROS was negative except as per HPI.   OBJECTIVE:   Physical Exam:  Blood pressure 140/80, pulse (!) 52, temperature 97.8 F (36.6 C), temperature source Oral, resp. rate 16, SpO2 100 %.  Constitutional: middle aged male, NAD HENT: normocephalic atraumatic, mucous membranes moist Eyes: conjunctiva non-erythematous Neck: supple Cardiovascular: Bradycardic. Regular rhythm. No m/r/g. Pulmonary/Chest: normal work of breathing on room air, lungs clear to auscultation bilaterally MSK: normal bulk and tone Neurological: Alert. EOMI, full visual fields, face symmetric. Weakness at right lower extremity on dorsiflexion, plantar flexion, and straight leg raise. Diminished grip strength on right.  Diminished strength with shoulder shrug on right. Sensation intact throughout. Patient unable to attempt heel to shin due to weakness. Psych: Normal mood and affect.  Labs: CBC    Component Value Date/Time   WBC 4.3 11/07/2021 0730   RBC 4.83 11/07/2021 0730   HGB 13.9 11/07/2021 0730   HGB 13.6 07/26/2016 1005   HCT 42.1 11/07/2021 0730   HCT 41.9 07/26/2016 1005   PLT 249 11/07/2021 0730   MCV 87.2 11/07/2021 0730   MCV 88 07/26/2016 1005   MCH 28.8 11/07/2021 0730   MCHC 33.0 11/07/2021 0730   RDW 13.0 11/07/2021 0730   RDW 15.1 07/26/2016 1005   LYMPHSABS 0.9 11/07/2021 0730   LYMPHSABS 2.1 07/26/2016 1005   MONOABS 0.3 11/07/2021 0730   EOSABS 0.1 11/07/2021 0730   EOSABS 0.1 07/26/2016 1005   BASOSABS 0.1 11/07/2021 0730   BASOSABS 0.0 07/26/2016 1005     CMP     Component Value Date/Time   NA 141 11/07/2021 0730   NA 142 07/26/2016 1005   K 3.8 11/07/2021 0730   CL 107 11/07/2021 0730   CO2 24 11/07/2021 0730   GLUCOSE 107 (H) 11/07/2021 0730   BUN 10 11/07/2021 0730   BUN 10 07/26/2016 1005   CREATININE 0.96 11/07/2021 0730   CALCIUM 9.4 11/07/2021 0730   PROT 6.0 (L) 06/01/2018 0649   PROT 7.1 07/26/2016 1005   ALBUMIN 2.8 (L) 06/01/2018 0649   ALBUMIN 4.6 07/26/2016 1005   AST 40 06/01/2018 0649   ALT 50 (H) 06/01/2018 0649   ALKPHOS 39 06/01/2018 0649   BILITOT 0.4 06/01/2018 0649   BILITOT 0.5 07/26/2016 1005   GFRNONAA >60 11/07/2021 0730   GFRAA >60 06/05/2018 1610    Imaging: DG Chest 1 View  Result Date: 11/07/2021 CLINICAL DATA:  Increasing generalized weakness. EXAM: CHEST  1 VIEW COMPARISON:  11/17/2017. FINDINGS: Trachea is midline. Heart size normal. Lungs are clear. No pleural fluid. IMPRESSION: Negative. Electronically Signed   By: Leanna Battles M.D.   On: 11/07/2021 16:12    EKG: ordered but not performed upon completion of note  ASSESSMENT & PLAN:   Assessment & Plan by Problem: Principal Problem:   Multiple sclerosis  pseudoexacerbation (HCC)   [name] is a 41 y.o. person living with a history of relapsing remitting MS  and HTN not currently on disease modifying therapy who presented with acute to subacute onset right sided weakness and dysarthria and admitted for MS exacerbation on hospital day 0  #Relapsing remitting multiple sclerosis Patient presenting with subacute onset right sided weakness, dysarthria, and urinary incontinence with known diagnosis of MS not on disease modifying agent. Labs and CXR negative for possible infectious etiology of neurologic changes. CBC and TSH pending. Ddx includes CVA however outside window to treat acute infarct given sxs >2 weeks. Brain/spine MRI pending.  -methylprednisolone 1g for 5 days for MS exacerbation -f/u MRI brain & spine -appreciate neurology recommendations -eventual outpatient MS specialist referral  for long-term therapy  #Asymptomatic Bradycardia Patient found to be bradycardic to 40s/50s. Unclear etiology at this time. EKG with sinus bradycardia. TSH pending. Not on any medications known to cause bradycardia. No history of heart disease. Currently asymptomatic without chest pain. -TSH  #HTN Patient mildly hypertensive upon presentation. Not on antihypertensives at home. Will continue to monitor.  Diet: NPO VTE:  lovenox IVF: None,None Code: Full  Prior to Admission Living Arrangement: Home, living with 56 year old daughter and 53 year old son Anticipated Discharge Location: Home Barriers to Discharge: MS flare requiring IV steroids and further workup  Dispo: Admit patient to Observation with expected length of stay less than 2 midnights.  Signed: Linward Natal, MD Internal Medicine Resident PGY-1  11/07/2021, 4:14 PM

## 2021-11-07 NOTE — ED Notes (Signed)
Patient attempting to slide off end of bed.  Questioned patient about where he was trying to go.  States "I need to get out of the bed.  I need to go to the car."  Patient informed that he is to remain in bed due to inability to walk without assistance.  Patient made aware of plan of care.

## 2021-11-07 NOTE — Progress Notes (Signed)
Attempted to scan patient twice and patient continued to move.  Even with meds, patient began to pull on imaging coil and raise up in scanner which is unsafe.

## 2021-11-07 NOTE — Progress Notes (Signed)
Explained to pt about procedure and importance of holding still and he expressed understanding having had multiple MRI's in the past. Pt was moving during the first of three exams and I spoke with him over the speaker concerning motion. Motion continued to get worse so I went into room and brought the pt out of the scanner to talk to him. He again said he understood and said he would try harder. Imaging improved for one sequence but then became undiagnostic  again. Pt was placed back in his stretcher and sent back to the ED.

## 2021-11-07 NOTE — ED Triage Notes (Addendum)
Patient reports progressing MS flare up with increasing generalized weakness and difficulty speaking for the past several weeks .

## 2021-11-07 NOTE — ED Notes (Signed)
Pt had bowel movement. Pt cleaned, and placed into a clean brief with new gown, sheets, and blankets.

## 2021-11-07 NOTE — ED Provider Notes (Signed)
[facility] EMERGENCY DEPARTMENT Provider Note   CSN: [id number] Arrival date & time: 11/07/21  0600     History  Chief Complaint  Patient presents with   MS Flare Up    [name] is a 41 y.o. male who presents emergency department for evaluation of a MS flareup that started about 1 to 2 weeks ago.  Symptoms include increasing generalized weakness, worse in the right side. He notes that his "teeth hurt" and has had worsening speech difficulties.  He also reports urinary incontinence for about 1 to 2 weeks as well.  No fecal incontinence.  He also has some blurred vision as well.  No alleviating or aggravating factors.  He denies chest pain, shortness of breath, nausea, vomiting, diarrhea, numbness, tingling.  Patient states that he does not regularly take medications for his MS and does not have a neurologist.  He was last admitted for MS flareup January 2020.  HPI     Home Medications Prior to Admission medications   Medication Sig Start Date End Date Taking? Authorizing Provider  acetaminophen (TYLENOL) 325 MG tablet Take 1-2 tablets (325-650 mg total) by mouth every 4 (four) hours as needed for mild pain. 06/07/18   Love, Evlyn Kanner, PA-C  pantoprazole (PROTONIX) 40 MG tablet Take 1 tablet (40 mg total) by mouth daily. 06/07/18   Jacquelynn Cree, PA-C      Allergies    Patient has no known allergies.    Review of Systems   Review of Systems  Eyes:  Positive for visual disturbance.  Gastrointestinal:  Negative for abdominal pain, nausea and vomiting.  Neurological:  Positive for speech difficulty and weakness.    Physical Exam Updated Vital Signs BP 140/80 (BP Location: Left Arm)   Pulse (!) 52   Temp 97.8 F (36.6 C) (Oral)   Resp 16   SpO2 100%  Physical Exam Vitals and nursing note reviewed.  Constitutional:      General: He is not in acute distress.    Comments: Unkempt, smells of urine, unable to speak more than 1-2 words at a time  HENT:      Head: Atraumatic.  Eyes:     Extraocular Movements: Extraocular movements intact.     Conjunctiva/sclera: Conjunctivae normal.     Pupils: Pupils are equal, round, and reactive to light.  Cardiovascular:     Rate and Rhythm: Normal rate and regular rhythm.     Pulses: Normal pulses.     Heart sounds: No murmur heard. Pulmonary:     Effort: Pulmonary effort is normal. No respiratory distress.     Breath sounds: Normal breath sounds.  Abdominal:     General: Abdomen is flat. There is no distension.     Palpations: Abdomen is soft.     Tenderness: There is no abdominal tenderness.  Musculoskeletal:        General: Normal range of motion.     Cervical back: Normal range of motion.  Skin:    General: Skin is warm and dry.     Capillary Refill: Capillary refill takes less than 2 seconds.  Neurological:     Mental Status: He is alert.     Comments: Weakness noted to the bilateral lower extremities on dorsiflexion, plantar flexion and straight leg raise, notably worse on right side  Grip strength grossly intact bilaterally though diminished on right compared to left   Psychiatric:        Mood and Affect: Mood normal.  ED Results / Procedures / Treatments   Labs (all labs ordered are listed, but only abnormal results are displayed) Labs Reviewed  BASIC METABOLIC PANEL - Abnormal; Notable for the following components:      Result Value   Glucose, Bld 107 (*)    All other components within normal limits  URINALYSIS, ROUTINE W REFLEX MICROSCOPIC - Abnormal; Notable for the following components:   Color, Urine AMBER (*)    Specific Gravity, Urine 1.032 (*)    Ketones, ur 20 (*)    Protein, ur 30 (*)    Bacteria, UA RARE (*)    All other components within normal limits  CBC WITH DIFFERENTIAL/PLATELET  ETHANOL  HIV ANTIBODY (ROUTINE TESTING W REFLEX)    EKG None  Radiology No results found.  Procedures Procedures    Medications Ordered in ED Medications  LORazepam  (ATIVAN) injection 1 mg (has no administration in time range)  insulin aspart (novoLOG) injection 0-9 Units (has no administration in time range)  enoxaparin (LOVENOX) injection 40 mg (has no administration in time range)    ED Course/ Medical Decision Making/ A&P Clinical Course as of 11/07/21 1602  Mon Nov 07, 2021  7434 41 year old male with history of MS and noncompliance here with worsening gait.  Not taking any standard MS treatment.  Getting basic labs neuro input and imaging.  Likely will need admission [MB]    Clinical Course User Index [MB] Terrilee Files, MD                           Medical Decision Making Amount and/or Complexity of Data Reviewed Labs: ordered. Radiology: ordered. ECG/medicine tests: ordered.  Risk Prescription drug management. Decision regarding hospitalization.   Social determinants of health:  Social History   Socioeconomic History   Marital status: Married    Spouse name: Not on file   Number of children: 2   Years of education: 12   Highest education level: Not on file  Occupational History   Not on file  Tobacco Use   Smoking status: Every Day    Packs/day: 1.00    Types: Cigarettes   Smokeless tobacco: Never  Vaping Use   Vaping Use: Never used  Substance and Sexual Activity   Alcohol use: Yes    Alcohol/week: 12.0 standard drinks of alcohol    Types: 12 Cans of beer per week   Drug use: Yes    Frequency: 30.0 times per week    Types: Marijuana    Comment: Daily   Sexual activity: Yes  Other Topics Concern   Not on file  Social History Narrative   Patient lives at home with his wife and children.    Patient is presently not working. Patient has a high school education.   Adopted.   Right-handed.   At least 8 cups caffeine daily.   Social Determinants of Health   Financial Resource Strain: Not on file  Food Insecurity: Not on file  Transportation Needs: Not on file  Physical Activity: Not on file  Stress: Not on  file  Social Connections: Not on file  Intimate Partner Violence: Not on file     Initial impression:  This patient presents to the ED for concern of MS flareup, this involves an extensive number of treatment options, and is a complaint that carries with it a high risk of complications and morbidity.   Differentials include exacerbation, pseudoexacerbation, CVA.   Comorbidities affecting  care:  MS, hypertension  Additional history obtained: Previous MRI January 2020 and previous labs  Lab Tests  I Ordered, reviewed, and interpreted labs and EKG.  The pertinent results include:  BMP, CBC and UA normal  Medicines ordered and prescription drug management:  I ordered medication including: Ativan 1mg  prn for MRI   I have reviewed the patients home medicines and have made adjustments as needed   Consultations Obtained:  I requested consultation with neurology and spoke with Dr. , and discussed lab and imaging findings as well as pertinent plan - they recommend: Patient will need MRI of the brain, C-spine and T-spine.  Dr. Wilford Corner also came to personally evaluate the patient while waiting for MRI imaging and given his symptoms believes that he should be admitted directly for IV steroids while waiting for MRI imaging to be completed.   ED Course/Re-evaluation: Presents to the ED for MS flareup.  Vitals without significant abnormality.  On exam, patient was noted to have poor balance and required assistance getting from wheelchair into bed due to his significant weakness.  Neuro exam significant for bilateral lower extremity weakness, difficulty speaking.  He was malodorous with the smell of old urine.  His labs were without acute findings, specifically no electrolyte abnormalities, no leukocytosis and no evidence of urinary tract infection.  I spoke with neurology who recommends ordering MRI of brain, C-spine and T-spine.  Patient was initially brought to the scanner although they had to  discontinue imaging as patient was having trouble sitting still.  I ordered Ativan 1 mg as needed for when he returns to the scanner, however the wait time is likely to take several more hours.  Dr. Wilford Corner came to evaluate the patient personally and recommends direct admission for IV steroids while waiting for imaging.  I spoke with admissions who agrees to admit patient. Disposition:  After consideration of the diagnostic results, physical exam, history and the patients response to treatment feel that the patent would benefit from admission.   MS flare: Plan and management as described above. Discharged home in good condition.  Final Clinical Impression(s) / ED Diagnoses Final diagnoses:  Multiple sclerosis [facility])    Rx / DC Orders ED Discharge Orders     None         [facility] 11/07/21 1603    01/08/22, MD 11/07/21 1736

## 2021-11-07 NOTE — ED Notes (Signed)
Patient repositioned in bed.  New sheets and adult brief applied.

## 2021-11-07 NOTE — ED Notes (Signed)
Neuro at bedside.

## 2021-11-08 ENCOUNTER — Observation Stay (HOSPITAL_COMMUNITY): Payer: Medicare HMO

## 2021-11-08 ENCOUNTER — Encounter (HOSPITAL_COMMUNITY): Payer: Self-pay | Admitting: Internal Medicine

## 2021-11-08 DIAGNOSIS — Z751 Person awaiting admission to adequate facility elsewhere: Secondary | ICD-10-CM | POA: Diagnosis not present

## 2021-11-08 DIAGNOSIS — E119 Type 2 diabetes mellitus without complications: Secondary | ICD-10-CM | POA: Diagnosis present

## 2021-11-08 DIAGNOSIS — R471 Dysarthria and anarthria: Secondary | ICD-10-CM | POA: Diagnosis present

## 2021-11-08 DIAGNOSIS — G35 Multiple sclerosis: Secondary | ICD-10-CM | POA: Diagnosis present

## 2021-11-08 DIAGNOSIS — I10 Essential (primary) hypertension: Secondary | ICD-10-CM | POA: Diagnosis present

## 2021-11-08 DIAGNOSIS — Z820 Family history of epilepsy and other diseases of the nervous system: Secondary | ICD-10-CM | POA: Diagnosis not present

## 2021-11-08 DIAGNOSIS — Z560 Unemployment, unspecified: Secondary | ICD-10-CM | POA: Diagnosis not present

## 2021-11-08 DIAGNOSIS — Z79899 Other long term (current) drug therapy: Secondary | ICD-10-CM | POA: Diagnosis not present

## 2021-11-08 DIAGNOSIS — Z91148 Patient's other noncompliance with medication regimen for other reason: Secondary | ICD-10-CM | POA: Diagnosis not present

## 2021-11-08 DIAGNOSIS — F17210 Nicotine dependence, cigarettes, uncomplicated: Secondary | ICD-10-CM | POA: Diagnosis present

## 2021-11-08 DIAGNOSIS — Z8249 Family history of ischemic heart disease and other diseases of the circulatory system: Secondary | ICD-10-CM | POA: Diagnosis not present

## 2021-11-08 DIAGNOSIS — R001 Bradycardia, unspecified: Secondary | ICD-10-CM | POA: Diagnosis present

## 2021-11-08 LAB — GLUCOSE, CAPILLARY
Glucose-Capillary: 127 mg/dL — ABNORMAL HIGH (ref 70–99)
Glucose-Capillary: 163 mg/dL — ABNORMAL HIGH (ref 70–99)
Glucose-Capillary: 152 mg/dL — ABNORMAL HIGH (ref 70–99)

## 2021-11-08 LAB — CBC
HCT: 44.5 % (ref 39.0–52.0)
Hemoglobin: 14.9 g/dL (ref 13.0–17.0)
MCH: 28.6 pg (ref 26.0–34.0)
MCHC: 33.5 g/dL (ref 30.0–36.0)
MCV: 85.4 fL (ref 80.0–100.0)
Platelets: 185 10*3/uL (ref 150–400)
RBC: 5.21 MIL/uL (ref 4.22–5.81)
RDW: 12.8 % (ref 11.5–15.5)
WBC: 4.4 10*3/uL (ref 4.0–10.5)
nRBC: 0 % (ref 0.0–0.2)

## 2021-11-08 LAB — BASIC METABOLIC PANEL
Anion gap: 17 — ABNORMAL HIGH (ref 5–15)
BUN: 12 mg/dL (ref 6–20)
CO2: 17 mmol/L — ABNORMAL LOW (ref 22–32)
Calcium: 9.6 mg/dL (ref 8.9–10.3)
Chloride: 107 mmol/L (ref 98–111)
Creatinine, Ser: 0.92 mg/dL (ref 0.61–1.24)
GFR, Estimated: >60 mL/min (ref >60)
Glucose, Bld: 125 mg/dL — ABNORMAL HIGH (ref 70–99)
Potassium: 3.9 mmol/L (ref 3.5–5.1)
Sodium: 141 mmol/L (ref 135–145)

## 2021-11-08 LAB — CBG MONITORING, ED: Glucose-Capillary: 118 mg/dL — ABNORMAL HIGH (ref 70–99)

## 2021-11-08 LAB — HEMOGLOBIN A1C
Hgb A1c MFr Bld: 4.9 % (ref 4.8–5.6)
Mean Plasma Glucose: 93.93 mg/dL

## 2021-11-08 LAB — HIV ANTIBODY (ROUTINE TESTING W REFLEX): HIV Screen 4th Generation wRfx: NONREACTIVE

## 2021-11-08 LAB — TSH: TSH: 0.357 u[IU]/mL (ref 0.350–4.500)

## 2021-11-08 NOTE — Progress Notes (Signed)
Yellow "Flum Pebble" vape pen removed from pt room. RN educated pt that vaping is against hospital policy. Pt verbalized understanding. Patient label placed on vape pen and placed in clear plastic bag outside of pt room. Adron Bene, MD notified.

## 2021-11-08 NOTE — Evaluation (Signed)
Clinical/Bedside Swallow Evaluation Patient Details  Name: [name] MRN: [id number] Date of Birth: [birth year]  Today's Date: 11/08/2021 Time: SLP Start Time (ACUTE ONLY): 0809 SLP Stop Time (ACUTE ONLY): 0822 SLP Time Calculation (min) (ACUTE ONLY): 13 min  Past Medical History:  Past Medical History:  Diagnosis Date   Depression    Diabetes mellitus without complication (HCC)    Eye abnormalities    unspecified by pt   Hypertension    Multiple sclerosis (HCC)    Weakness    generalized   Past Surgical History:  Past Surgical History:  Procedure Laterality Date   WISDOM TOOTH EXTRACTION     HPI:  [name] is a 41 yo male with PMHx of relapsing remitting MS and HTN who presented with lower extremity weakness, speech difficulty, and urinary incontinence. BSE 2020 no signs aspiration and regular texture, thin liquids recommended.    Assessment / Plan / Recommendation  Clinical Impression  Pt demonstrated mild pharyngeal dysphagia with larger thin liquid volumes. His speech is mildly dysarthric, weak cough and adequate vocal quality and intensity. His lingual ROM is slightly imprecise and weak. Smaller straw sips were safe from subjective view however, as consective sips taken there was one immediate cough indicative of suspected airway intrusion. Reflexive cough stronger than volitional. Due to imprecise manual dexterity he required assist from therapist for coordination with applesauce and supervision with cup/straw. Orally he contained and masticasted solid texture without residue and timely. Christen was able to demonstrate smaller sips once cued and it is recommended that he take small sips via straw, regular texture, pills whole in puree and will need assist with feeding- this was communicated to RN. ST to follow for safety and compliance with strategies. SLP Visit Diagnosis: Dysphagia, unspecified (R13.10)    Aspiration Risk  Mild aspiration risk    Diet Recommendation  Regular;Thin liquid   Liquid Administration via: Straw Medication Administration: Whole meds with puree Supervision: Staff to assist with self feeding Compensations: Slow rate;Small sips/bites Postural Changes: Seated upright at 90 degrees    Other  Recommendations Oral Care Recommendations: Oral care BID    Recommendations for follow up therapy are one component of a multi-disciplinary discharge planning process, led by the attending physician.  Recommendations may be updated based on patient status, additional functional criteria and insurance authorization.  Follow up Recommendations  (TBD)      Assistance Recommended at Discharge Frequent or constant Supervision/Assistance (for feeding assist)  Functional Status Assessment Patient has had a recent decline in their functional status and demonstrates the ability to make significant improvements in function in a reasonable and predictable amount of time.  Frequency and Duration min 1 x/week  2 weeks       Prognosis Prognosis for Safe Diet Advancement: Good      Swallow Study   General Date of Onset: 11/07/21 HPI: [name] is a 41 yo male with PMHx of relapsing remitting MS and HTN who presented with lower extremity weakness, speech difficulty, and urinary incontinence. BSE 2020 no signs aspiration and regular texture, thin liquids recommended. Type of Study: Bedside Swallow Evaluation Previous Swallow Assessment:  (see HPI) Diet Prior to this Study: NPO Temperature Spikes Noted: No Respiratory Status: Room air History of Recent Intubation: No Behavior/Cognition: Alert;Cooperative;Pleasant mood Oral Cavity Assessment: Within Functional Limits Oral Care Completed by SLP: No Oral Cavity - Dentition: Missing dentition (missing several lower posterior left) Vision: Functional for self-feeding Self-Feeding Abilities: Needs assist (decreased coordination and manual dexterity) Patient  Positioning: Upright in bed Baseline Vocal  Quality: Normal Volitional Cough: Weak Volitional Swallow: Able to elicit    Oral/Motor/Sensory Function Overall Oral Motor/Sensory Function: Mild impairment Facial ROM: Within Functional Limits Facial Symmetry: Within Functional Limits Facial Strength: Within Functional Limits Lingual ROM: Reduced right;Reduced left Lingual Symmetry: Within Functional Limits Lingual Strength: Reduced   Ice Chips Ice chips: Not tested   Thin Liquid Thin Liquid: Impaired Presentation: Straw Oral Phase Impairments:  (none) Pharyngeal  Phase Impairments: Cough - Immediate    Nectar Thick Nectar Thick Liquid: Not tested   Honey Thick Honey Thick Liquid: Not tested   Puree Puree: Within functional limits Presentation: Spoon   Solid     Solid: Within functional limits      [name] 11/08/2021,8:40 AM

## 2021-11-08 NOTE — Progress Notes (Signed)
Mri remains pending. Will defer sedation for MRI to the primary team. Will follow once imaging is available. Continue with plan for total of 5 days of IV steroids as in the initial consult. -- Milon Dikes, MD Neurologist [facility] Pager: [phone]

## 2021-11-08 NOTE — ED Notes (Signed)
Patient transported to floor by RN 

## 2021-11-08 NOTE — Progress Notes (Signed)
IV Ativan admin on department prior to MRI as ordered. Per Amil Amen, MRI technician, pt unable to lie still for exam. Adron Bene, MD notified of unsuccessful attempt of MRI. Pt returned to room 6N30 via bed.

## 2021-11-08 NOTE — Plan of Care (Signed)

## 2021-11-08 NOTE — Progress Notes (Signed)
OT Cancellation Note  Patient Details Name: [name] MRN: [id number] DOB: [birth year]   Cancelled Treatment:    Reason Eval/Treat Not Completed: Patient at procedure or test/ unavailable (MRI with sedating medications at this time. OT to follow as time allows and appropriateness)  Mateo Flow 11/08/2021, 12:04 PM

## 2021-11-08 NOTE — Progress Notes (Signed)
Subjective:   Summary: [name] is a 41 yo male with PMHx of relapsing remitting MS and HTN who presented with lower extremity weakness, speech difficulty, and urinary incontinence.  NAEO.  Patient states he is doing okay today. Passed his speech evaluation and able to have food but would like more fluids to wash it down. His speech feels about the same as yesterday. He states he is not nervous about MRI and would like to get it done. Couldn't scan him yesterday due to movement despite 2 mg Ativan for sedation.   Objective:  Vital signs in last 24 hours: Vitals:   11/08/21 0045 11/08/21 0100 11/08/21 0115 11/08/21 0130  BP: 115/61 111/63 110/64 111/63  Pulse:   (!) 52 (!) 53  Resp:   16 16  Temp:    98.5 F (36.9 C)  TempSrc:    Oral  SpO2:   95% 96%   Supplemental O2: Room Air SpO2: 96 %   Physical Exam:  Constitutional: well-appearing male sitting in hospital bed, in no acute distress HENT: normocephalic atraumatic, mucous membranes moist Eyes: conjunctiva non-erythematous Neck: supple Cardiovascular: regular rhythm, no m/r/g Pulmonary/Chest: normal work of breathing on room air, lungs clear to auscultation bilaterally Neurological: alert & oriented x 3, continues with diminished strength in right upper and lower extremities. Speech difficulties unchanged. Skin: warm and dry Psych: Normal mood and affect  There were no vitals filed for this visit.   Intake/Output Summary (Last 24 hours) at 11/08/2021 0725 Last data filed at 11/07/2021 2013 Gross per 24 hour  Intake 50 ml  Output --  Net 50 ml   Net IO Since Admission: 50 mL [11/08/21 0725]  Pertinent Labs:    Latest Ref Rng & Units 11/08/2021    6:08 AM 11/07/2021    7:30 AM 06/05/2018    7:02 AM  CBC  WBC 4.0 - 10.5 K/uL 4.4  4.3  4.4   Hemoglobin 13.0 - 17.0 g/dL 29.5  18.8  41.6   Hematocrit 39.0 - 52.0 % 44.5  42.1  38.2   Platelets 150 - 400 K/uL 185  249  251        Latest Ref Rng & Units  11/07/2021    7:30 AM 06/05/2018    7:02 AM 06/01/2018    6:49 AM  CMP  Glucose 70 - 99 mg/dL 606  301  89   BUN 6 - 20 mg/dL 10  13  17    Creatinine 0.61 - 1.24 mg/dL  6.01  0.93   Sodium 135 - 145 mmol/L 141  140  141   Potassium 3.5 - 5.1 mmol/L 3.8  4.5  4.3   Chloride 98 - 111 mmol/L 107  104  106   CO2 22 - 32 mmol/L 24  27  27    Calcium 8.9 - 10.3 mg/dL 9.4  8.9  8.7   Total Protein 6.5 - 8.1 g/dL   6.0   Total Bilirubin 0.3 - 1.2 mg/dL   0.4   Alkaline Phos 38 - 126 U/L   39   AST 15 - 41 U/L   40   ALT 0 - 44 U/L   50     Imaging: DG Chest 1 View  Result Date: 11/07/2021 CLINICAL DATA:  Increasing generalized weakness. EXAM: CHEST  1 VIEW COMPARISON:  11/17/2017. FINDINGS: Trachea is midline. Heart size normal. Lungs are clear. No pleural fluid. IMPRESSION: Negative. Electronically Signed   By: 01/08/2022  M.D.   On: 11/07/2021 16:12    Assessment/Plan:   Principal Problem:   Multiple sclerosis pseudoexacerbation [facility])   Patient Summary: [name] is a 41 y.o. person living with a history of relapsing remitting MS  and HTN not currently on disease modifying therapy who presented with acute to subacute onset right sided weakness and dysarthria and admitted for MS exacerbation on hospital day 0   #Relapsing remitting multiple sclerosis Presented with right upper and lower extremity weakness and dysarthria. Known dx of relapsing-remitting MS not on therapy. Physical exam largely unchanged from yesterday. Neurology consulted with recommendations for MRI brain, cervical and T spine. Patient has failed to complete scan 3 times now despite Ativan. Options limited given patient's bradycardia. Radiology does not offer option of conscious sedation.  -Solu-medrol 1 g daily for 5 days (beginning 7/10) -per neurology, will continue to treat and retry MRI in a few days. Not urgent at this point, but will be valuable in outpatient f/u. -eventual outpatient MS specialist  referral   #Asymptomatic Bradycardia Continues to be bradycardic to 50s. Not on any medications known to cause bradycardia. No history of heart disease. TSH wnl. Otherwise hemodynamically stable. -Patient remains asymptomatic.  #HTN Blood pressures largely normotensive since admission. Will continue to monitor.   Diet: NPO VTE:  lovenox IVF: None,None Code: Full   Prior to Admission Living Arrangement: Home, living with 56 year old daughter and 54 year old son Anticipated Discharge Location: Home Barriers to Discharge: MS flare requiring IV steroids and further workup   Dispo: Admit patient to Observation with expected length of stay less than 2 midnights.  Adron Bene MD Internal Medicine Resident PGY-1 Please contact the on call pager after 5 pm and on weekends at [phone].

## 2021-11-08 NOTE — Progress Notes (Signed)
Inpatient Rehab Admissions Coordinator:   Per therapy recommendations,  patient was screened for CIR candidacy by Megan Salon, MS, CCC-SLP.   At this time, Pt. Is just beginning 5 day course of Solu-medrol. I will follow up and rescreen once nearing the end of his course. Please contact me with any questions.

## 2021-11-08 NOTE — Evaluation (Signed)
Physical Therapy Evaluation Patient Details Name: [name] MRN: [id number] DOB: 1980/10/08 Today's Date: 11/08/2021  History of Present Illness  Pt is a 42 yo male with PMHx of relapsing remitting MS and HTN who presented with lower extremity weakness, speech difficulty, and urinary incontinence. His symptoms became noticeable 1.5 weeks ago and began with weakness in his right lower extremity with difficulty walking. He states he's fallen 14 to 15 times in the past 2 weeks due to imbalance.  Clinical Impression  Pt agreeable to physical therapy evaluation/treatment session. Pt with poor sitting and standing balance despite assistance and use of AD. Pt unable to attempt stand or step pivot transfer to chair. Pt currently presents with functional limitations secondary to impairments listed in PT problem list. Pt to benefit from skilled, acute care physical therapy interventions to maximize his mobility, independence level and quality of life.       Recommendations for follow up therapy are one component of a multi-disciplinary discharge planning process, led by the attending physician.  Recommendations may be updated based on patient status, additional functional criteria and insurance authorization.  Follow Up Recommendations Acute inpatient rehab (3hours/day)      Assistance Recommended at Discharge PRN depending on pt progress  Patient can return home with the following  A lot of help with walking and/or transfers;Help with stairs or ramp for entrance;Assist for transportation;Assistance with cooking/housework;A lot of help with bathing/dressing/bathroom    Equipment Recommendations Other (comment) (defer to inpt rehab)  Recommendations for Other Services  Rehab consult    Functional Status Assessment Patient has had a recent decline in their functional status and demonstrates the ability to make significant improvements in function in a reasonable and predictable amount of time.      Precautions / Restrictions Precautions Precautions: Fall Restrictions Weight Bearing Restrictions: No      Mobility  Bed Mobility Overal bed mobility: Needs Assistance Bed Mobility: Supine to Sit, Sit to Supine     Supine to sit: +2 for physical assistance, +2 for safety/equipment, Mod assist Sit to supine: Mod assist, +2 for physical assistance, +2 for safety/equipment   General bed mobility comments: Pt with severe retropulsion at times when sitting at EOB.    Transfers Overall transfer level: Needs assistance Equipment used: Rolling walker (2 wheels) Transfers: Sit to/from Stand Sit to Stand: Min assist, +2 safety/equipment           General transfer comment: Pt performed two sit <> stands during this encounter. Pt with impulsiveness, unsteadiness, fatigue, and B LE buckling. Deferred transfer to chair.    Ambulation/Gait               General Gait Details: unable  Stairs            Wheelchair Mobility    Modified Rankin (Stroke Patients Only)       Balance Overall balance assessment: Needs assistance Sitting-balance support: Bilateral upper extremity supported Sitting balance-Leahy Scale: Poor Sitting balance - Comments: Pt able to hold himself up at times but other times required moderate assistance x 1-2 due to retropulsion   Standing balance support: Bilateral upper extremity supported, Reliant on assistive device for balance Standing balance-Leahy Scale: Poor                               Pertinent Vitals/Pain Pain Assessment Pain Assessment: No/denies pain (no pain at rest but reports he would have bilateral LE pain  if moving (8/10). Pt reports numbness to R LE)    Home Living Family/patient expects to be discharged to:: Private residence Living Arrangements: Alone (41 y/o and 52 y/o children stay with him on weekends) Available Help at Discharge: Other (Comment);Available PRN/intermittently (children only available on  weekend) Type of Home: House Home Access: Stairs to enter Entrance Stairs-Rails: Can reach both Entrance Stairs-Number of Steps: 3   Home Layout: One level Home Equipment: Shower seat      Prior Function Prior Level of Function : Driving;History of Falls (last six months) (pt on disability)             Mobility Comments: Does not use AD. ADLs Comments: Independent with basic ADL's. Pt uses [employer] at times for groceries but other times takes care of his own groceries. Pt manages his own finances.     Hand Dominance   Dominant Hand: Right    Extremity/Trunk Assessment   Upper Extremity Assessment Upper Extremity Assessment: Defer to OT evaluation    Lower Extremity Assessment Lower Extremity Assessment: RLE deficits/detail;LLE deficits/detail RLE:  (2-/5) RLE Sensation: WNL (increased time required) LLE:  (2-/5) LLE Sensation: WNL (increased time required)       Communication   Communication: Expressive difficulties  Cognition Arousal/Alertness: Awake/alert Behavior During Therapy: Flat affect Overall Cognitive Status: Difficult to assess                                 General Comments: A x O 4 but inconsistent historian        General Comments      Exercises     Assessment/Plan    PT Assessment Patient needs continued PT services  PT Problem List Decreased strength;Decreased activity tolerance;Decreased balance;Decreased mobility;Decreased coordination;Decreased knowledge of use of DME;Decreased safety awareness;Decreased knowledge of precautions;Pain       PT Treatment Interventions DME instruction;Gait training;Functional mobility training;Therapeutic activities;Therapeutic exercise;Balance training;Neuromuscular re-education;Patient/family education;Wheelchair mobility training    PT Goals (Current goals can be found in the Care Plan section)  Acute Rehab PT Goals Patient Stated Goal: none stated PT Goal Formulation: With  patient Time For Goal Achievement: 11/15/21 Potential to Achieve Goals: Fair    Frequency Min 5X/week     Co-evaluation               AM-PAC PT "6 Clicks" Mobility  Outcome Measure Help needed turning from your back to your side while in a flat bed without using bedrails?: Total Help needed moving from lying on your back to sitting on the side of a flat bed without using bedrails?: Total Help needed moving to and from a bed to a chair (including a wheelchair)?: Total Help needed standing up from a chair using your arms (e.g., wheelchair or bedside chair)?: A Little Help needed to walk in hospital room?: Total Help needed climbing 3-5 steps with a railing? : Total 6 Click Score: 8    End of Session Equipment Utilized During Treatment: Gait belt Activity Tolerance: Other (comment) (pt limited by poor balance and weakness) Patient left: in bed;with call bell/phone within reach (nurse notified regarding bed alarm not working despite pt being centered) Nurse Communication: Mobility status;Precautions PT Visit Diagnosis: Other abnormalities of gait and mobility (R26.89);Repeated falls (R29.6);Muscle weakness (generalized) (M62.81)    Time: [id number] PT Time Calculation (min) (ACUTE ONLY): 31 min   Charges:   PT Evaluation $PT Eval Moderate Complexity: 1 Mod PT Treatments $Therapeutic Activity:  8-22 mins        Tana Coast, PT   [employer] 11/08/2021, 11:02 AM

## 2021-11-08 NOTE — ED Notes (Signed)
Patient attempting to stand at end of bed.  Redirected back to bed.  Patient reports he is wet.  Patient changed.  New sheets provided and warm blankets given.  Patient encouraged to remain in bed and use call bell when needed.  Yellow socks placed

## 2021-11-08 NOTE — Progress Notes (Signed)
Pt was attempted 3 times --2 times with meds--pt unable to hold still in order to obtain diagnostic images.  Patient  will possibly need general anesthesia for these exams. Please call MRI [phone] with any further questions.

## 2021-11-09 DIAGNOSIS — G35 Multiple sclerosis: Secondary | ICD-10-CM | POA: Diagnosis not present

## 2021-11-09 LAB — BASIC METABOLIC PANEL
Anion gap: 13 (ref 5–15)
BUN: 16 mg/dL (ref 6–20)
CO2: 23 mmol/L (ref 22–32)
Calcium: 9.6 mg/dL (ref 8.9–10.3)
Chloride: 103 mmol/L (ref 98–111)
Creatinine, Ser: 0.94 mg/dL (ref 0.61–1.24)
GFR, Estimated: >60 mL/min (ref >60)
Glucose, Bld: 174 mg/dL — ABNORMAL HIGH (ref 70–99)
Potassium: 4 mmol/L (ref 3.5–5.1)
Sodium: 139 mmol/L (ref 135–145)

## 2021-11-09 LAB — GLUCOSE, CAPILLARY
Glucose-Capillary: 133 mg/dL — ABNORMAL HIGH (ref 70–99)
Glucose-Capillary: 167 mg/dL — ABNORMAL HIGH (ref 70–99)
Glucose-Capillary: 198 mg/dL — ABNORMAL HIGH (ref 70–99)
Glucose-Capillary: 156 mg/dL — ABNORMAL HIGH (ref 70–99)

## 2021-11-09 MED ORDER — LORAZEPAM 2 MG/ML IJ SOLN: 2.0000 mg | Freq: Once | INTRAMUSCULAR | Status: DC | PRN

## 2021-11-09 MED ORDER — NICOTINE 14 MG/24HR TD PT24
14.0000 mg | MEDICATED_PATCH | Freq: Every day | TRANSDERMAL | Status: DC
  Administered 2021-11-09 – 2021-11-16 (×8): 14 mg via TRANSDERMAL
  Filled 2021-11-09 (×8): qty 1

## 2021-11-09 NOTE — Evaluation (Signed)
Occupational Therapy Evaluation Patient Details Name: [name] MRN: [id number] DOB: Mar 23, 1981 Today's Date: 11/09/2021   History of Present Illness 41 yo male presented with BIL LE weakness, speech difficulty, and urinary incontinence. His symptoms became noticeable 1.5 weeks ago and began with weakness in his right lower extremity with difficulty walking. He states he's fallen 14 to 15 times in the past 2 weeks due to imbalance. PMH PMHx of relapsing remitting MS and HTN   Clinical Impression   PT admitted with workup for MS flareup underway. Pt currently with functional limitiations due to the deficits listed below (see OT problem list). Pt currently requires (A) for all aspects of adls with balance changes. Pt with decrease use of R UE (dominant hand) with undershooting and sensation changes. Pt self feeding L UE due to changes.  Pt will benefit from skilled OT to increase their independence and safety with adls and balance to allow discharge CIR.       Recommendations for follow up therapy are one component of a multi-disciplinary discharge planning process, led by the attending physician.  Recommendations may be updated based on patient status, additional functional criteria and insurance authorization.   Follow Up Recommendations  Acute inpatient rehab (3hours/day)    Assistance Recommended at Discharge    Patient can return home with the following Two people to help with walking and/or transfers;Two people to help with bathing/dressing/bathroom    Functional Status Assessment  Patient has had a recent decline in their functional status and demonstrates the ability to make significant improvements in function in a reasonable and predictable amount of time.  Equipment Recommendations  BSC/3in1;Wheelchair (measurements OT);Wheelchair cushion (measurements OT)    Recommendations for Other Services Rehab consult     Precautions / Restrictions Precautions Precautions:  Fall Precaution Comments: condom foley Restrictions Weight Bearing Restrictions: No      Mobility Bed Mobility Overal bed mobility: Needs Assistance Bed Mobility: Supine to Sit, Sit to Supine     Supine to sit: Mod assist Sit to supine: Mod assist   General bed mobility comments: pt exiting on the R side with incerased time. pt with min (A) for static sitting. pt able to lateral scoot toward [facility] on R side of the bed. pt able to use gravity to get back in the bed but needs (A) for alignment in the bed    Transfers                   General transfer comment: defer next session      Balance Overall balance assessment: Needs assistance Sitting-balance support: Bilateral upper extremity supported Sitting balance-Leahy Scale: Poor                                     ADL either performed or assessed with clinical judgement   ADL Overall ADL's : Needs assistance/impaired Eating/Feeding: Set up Eating/Feeding Details (indicate cue type and reason): using L hand and voiding dominant r hand. r hand undershooting Grooming: Moderate assistance   Upper Body Bathing: Moderate assistance   Lower Body Bathing: Maximal assistance   Upper Body Dressing : Moderate assistance   Lower Body Dressing: Maximal assistance                 General ADL Comments: pt bed level (A) due to +2 person (A) best for safety. pt abel to lateral scoot to [facility] .  Vision         Perception     Praxis      Pertinent Vitals/Pain Pain Assessment Pain Assessment: No/denies pain     Hand Dominance Right   Extremity/Trunk Assessment Upper Extremity Assessment Upper Extremity Assessment: RUE deficits/detail RUE Deficits / Details: reports numbness but when compared to L Ue reports same pt reports increased numbness in the palm. unable to sustain shouler flexion, unable to make "ok" sign. unable to sustain 2nd digit pointing RUE Sensation: decreased light touch RUE  Coordination: decreased fine motor;decreased gross motor   Lower Extremity Assessment Lower Extremity Assessment: Defer to PT evaluation   Cervical / Trunk Assessment Cervical / Trunk Assessment: Kyphotic   Communication Communication Communication: Expressive difficulties   Cognition Arousal/Alertness: Awake/alert Behavior During Therapy: Flat affect Overall Cognitive Status: Difficult to assess                                 General Comments: Pt A x O but with decreased ability to follow simple commands and lack of safety awareness present.     General Comments  incr risk for skin break down due to decreased mobilty and incontinence. monitor closely    Exercises     Shoulder Instructions      Home Living Family/patient expects to be discharged to:: Private residence Living Arrangements: Alone (41 y/o and 59 y/o children stay with him on weekends) Available Help at Discharge: Other (Comment);Available PRN/intermittently (children only available on weekend) Type of Home: House Home Access: Stairs to enter [employer] of Steps: 3 Entrance Stairs-Rails: Can reach both Home Layout: One level     Bathroom Shower/Tub: Chief Strategy Officer: Standard Bathroom Accessibility: Yes   Home Equipment: Shower seat   Additional Comments: daughter 61 and son 12      Prior Functioning/Environment Prior Level of Function : Driving;History of Falls (last six months) (pt on disability)             Mobility Comments: Does not use AD. ADLs Comments: Independent with basic ADL's. Pt uses [employer] at times for groceries but other times takes care of his own groceries. Pt manages his own finances.        OT Problem List: Decreased strength;Decreased range of motion;Decreased activity tolerance;Impaired balance (sitting and/or standing);Decreased coordination;Decreased cognition;Decreased safety awareness;Decreased knowledge of use of DME or  AE;Decreased knowledge of precautions;Impaired sensation;Impaired UE functional use      OT Treatment/Interventions: Self-care/ADL training;Therapeutic exercise;Neuromuscular education;Energy conservation;DME and/or AE instruction;Manual therapy;Modalities;Therapeutic activities;Cognitive remediation/compensation;Patient/family education;Balance training    OT Goals(Current goals can be found in the care plan section) Acute Rehab OT Goals Patient Stated Goal: to be abel to walk OT Goal Formulation: With patient Time For Goal Achievement: 11/23/21 Potential to Achieve Goals: Good  OT Frequency: Min 3X/week    Co-evaluation              AM-PAC OT "6 Clicks" Daily Activity     Outcome Measure Help from another person eating meals?: A Lot Help from another person taking care of personal grooming?: A Lot Help from another person toileting, which includes using toliet, bedpan, or urinal?: A Lot Help from another person bathing (including washing, rinsing, drying)?: A Lot Help from another person to put on and taking off regular upper body clothing?: A Lot Help from another person to put on and taking off regular lower body clothing?: A Lot 6 Click  Score: 12   End of Session Nurse Communication: Mobility status;Precautions  Activity Tolerance: Patient tolerated treatment well Patient left: in bed;with call bell/phone within reach;with bed alarm set  OT Visit Diagnosis: Unsteadiness on feet (R26.81);Repeated falls (R29.6);Muscle weakness (generalized) (M62.81)                Time: 3244-0102 OT Time Calculation (min): 14 min Charges:  OT General Charges $OT Visit: 1 Visit OT Evaluation $OT Eval Moderate Complexity: 1 Mod   Brynn, OTR/L  Acute Rehabilitation Services Office: [phone] .   Mateo Flow 11/09/2021, 6:07 PM

## 2021-11-09 NOTE — Progress Notes (Signed)
Physical Therapy Treatment Patient Details Name: [name] MRN: [id number] DOB: [birth year] Today's Date: 11/09/2021   History of Present Illness Pt is a 41 yo male with PMHx of relapsing remitting MS and HTN who presented with lower extremity weakness, speech difficulty, and urinary incontinence. His symptoms became noticeable 1.5 weeks ago and began with weakness in his right lower extremity with difficulty walking. He states he's fallen 14 to 15 times in the past 2 weeks due to imbalance.    PT Comments    Pt instructed in and performed therex and therapeutic activities. Pt continues to demonstrate poor standing balance; some improvements in sitting balance noted but retropulsion still present at times. Pt not safe to transfer to chair with RW at this time.   Recommendations for follow up therapy are one component of a multi-disciplinary discharge planning process, led by the attending physician.  Recommendations may be updated based on patient status, additional functional criteria and insurance authorization.  Follow Up Recommendations  Acute inpatient rehab (3hours/day)     Assistance Recommended at Discharge Frequent or constant Supervision/Assistance  Patient can return home with the following A lot of help with walking and/or transfers;Help with stairs or ramp for entrance;Assist for transportation;Assistance with cooking/housework;A lot of help with bathing/dressing/bathroom   Equipment Recommendations  Other (comment) (defer to inpt rehab)    Recommendations for Other Services Rehab consult     Precautions / Restrictions Precautions Precautions: Fall Restrictions Weight Bearing Restrictions: No     Mobility  Bed Mobility Overal bed mobility: Needs Assistance Bed Mobility: Supine to Sit, Sit to Supine     Supine to sit: +2 for physical assistance, +2 for safety/equipment, Mod assist, Min assist Sit to supine: Mod assist, +2 for physical assistance, +2 for  safety/equipment   General bed mobility comments: Pt rolled to left side when performing supine to sit. Pt required sling pad, HHA, and bed rail. Pt required mostly min assist but also moderate assistance to scoot forwards. Pt with retropulsion at times when sitting at EOB requiring intermittent assistance to maintain his balance. Pt able to lift each arm together and unilaterally without LOB while sitting.    Transfers Overall transfer level: Needs assistance Equipment used: Rolling walker (2 wheels) Transfers: Sit to/from Stand Sit to Stand: Min assist, +2 safety/equipment           General transfer comment: Pt performed two sit <> stands during this encounter. Pt performed side steps at EOB each trial with minimal to moderate assistance x 2 (to left side). Pt with impulsiveness and poor safety awareness, unsteadiness, fatigue, poor coordination, and B LE buckling. Deferred transfer to chair.    Ambulation/Gait               General Gait Details: unable   Stairs             Wheelchair Mobility    Modified Rankin (Stroke Patients Only)       Balance Overall balance assessment: Needs assistance Sitting-balance support: Bilateral upper extremity supported Sitting balance-Leahy Scale: Poor Sitting balance - Comments: Pt able to hold himself up at times but other times required moderate assistance x 1-2 due to retropulsion   Standing balance support: Bilateral upper extremity supported, Reliant on assistive device for balance Standing balance-Leahy Scale: Poor                              Cognition Arousal/Alertness: Awake/alert Behavior During  Therapy: Flat affect Overall Cognitive Status: Difficult to assess                                 General Comments: Pt A x O but with decreased ability to follow simple commands and lack of safety awareness present.        Exercises General Exercises - Lower Extremity Ankle  Circles/Pumps: Both, 10 reps, Supine, AROM Quad Sets: Both, 10 reps, Supine, AROM Heel Slides: Both, 10 reps, Supine, AAROM (therapist assist)    General Comments        Pertinent Vitals/Pain Pain Assessment Pain Assessment: 0-10 Pain Score:  (DNQ) Pain Location: low back Pain Descriptors / Indicators: Spasm Pain Intervention(s): Limited activity within patient's tolerance, Monitored during session, Patient requesting pain meds-RN notified    Home Living                          Prior Function            PT Goals (current goals can now be found in the care plan section) Acute Rehab PT Goals Patient Stated Goal: none stated PT Goal Formulation: With patient Time For Goal Achievement: 11/15/21 Potential to Achieve Goals: Fair Progress towards PT goals: Progressing toward goals (slow)    Frequency    Min 5X/week      PT Plan Current plan remains appropriate    Co-evaluation              AM-PAC PT "6 Clicks" Mobility   Outcome Measure  Help needed turning from your back to your side while in a flat bed without using bedrails?: Total Help needed moving from lying on your back to sitting on the side of a flat bed without using bedrails?: Total Help needed moving to and from a bed to a chair (including a wheelchair)?: Total Help needed standing up from a chair using your arms (e.g., wheelchair or bedside chair)?: A Lot Help needed to walk in hospital room?: Total Help needed climbing 3-5 steps with a railing? : Total 6 Click Score: 7    End of Session Equipment Utilized During Treatment: Gait belt Activity Tolerance: Other (comment);Patient limited by fatigue (pt limited by poor balance and weakness) Patient left: in bed;with call bell/phone within reach (nurse notified regarding bed alarm not working despite pt being centered) Nurse Communication: Mobility status;Precautions PT Visit Diagnosis: Other abnormalities of gait and mobility  (R26.89);Repeated falls (R29.6);Muscle weakness (generalized) (M62.81)     Time: 5366-4403 PT Time Calculation (min) (ACUTE ONLY): 26 min  Charges:  $Therapeutic Exercise: 8-22 mins $Therapeutic Activity: 8-22 mins                     Tana Coast, PT    [employer] 11/09/2021, 2:45 PM

## 2021-11-09 NOTE — Progress Notes (Signed)
Subjective: Patient seen in room with his children at the bedside. He states that he doesn't feel much better than when he was initially admitted. He does endorse numbness in the right hand. He does state that since the steroids were started his itching in his back and head has improved. He is agreeable to an MRI under sedation and he states he wants to get it done before he gets discharged. He also asked for home health assistance.   Exam: Vitals:   11/09/21 0635 11/09/21 0827  BP: 131/89 134/87  Pulse: 67 (!) 54  Resp: 17 18  Temp:  97.8 F (36.6 C)  SpO2: 100% 100%   Gen: In bed, NAD Resp: non-labored breathing, no acute distress Abd: soft, nt  Neuro: Awake alert oriented x3 Paucity of speech, no dysarthria. No evidence of aphasia Cranial nerves: Pupils equal round react light, extra movements intact, visual fields full, face symmetric, tongue and palate midline. Motor examination with no drift in the upper extremities.   Lower extremities are antigravity with drift-3-4/5. Patient reports numbness in right hand. Sensation intact in bilateral arms, legs, and feet. DTRs: Brisk in the lower extremities.  Pertinent Labs: BMP and CBC unremarkable MRI pending  Assessment:  41 year old with relapsing remitting MS, was on Tysabri at 1 point and lastly on [place] but did not elect to continue treatment with disease modifying agents for the past 4 to 5 years presenting for progressive gait instability, lower extremity weakness and now difficulty with speech, swallowing as well as urinary incontinence. Looks like his MS is becoming more progressive and might get to the progressive relapsing phase rather than truly relapsing remitting. At this point, it might not be unreasonable to treat him with steroids irrespective of whether he has abnormal enhancement on scans or not. Eventually he will need outpatient MS specialist for long-term disease modifying therapy to manage his multiple  sclerosis.  Impression:  MS exacerbation  Recommendations: - Continue methylprednisolone 1000mg  for 5 days (day 3/5) - MRI under sedation- patient is agreeable to this plan, Primary team updated - Will ensure primary team is aware of patient request for home health  - Outpatient neurology follow up for MS  Patient seen and examined by NP/APP with MD. MD to update note as needed.   , DNP, FNP-BC [facility] Pager: [phone]

## 2021-11-09 NOTE — Progress Notes (Addendum)
Subjective:   Summary: [name] is a 41 yo male with PMHx of relapsing remitting MS and HTN who presented with lower extremity weakness, speech difficulty, and urinary incontinence.  Overnight Events: NAEO.  Patient feels similar to yesterday. Feels his speech may have improved by "1%." He states he had trouble while in MRI scanner due to having to itch occasionally. Feels like he would be able to complete if he had medication to "calm him down" but we told him he did indeed have medication (Ativan). He continues to endorse difficulty eating, requiring liquids to wash down his food.  Objective:  Vital signs in last 24 hours: Vitals:   11/08/21 1611 11/08/21 2139 11/09/21 0635 11/09/21 0827  BP: 126/86 (!) 150/88 131/89 134/87  Pulse: 63 73 67 (!) 54  Resp: 18  17 18   Temp: 98.2 F (36.8 C) 98.1 F (36.7 C)  97.8 F (36.6 C)  TempSrc:  Oral    SpO2: 98% 100% 100% 100%   Supplemental O2: Room Air SpO2: 100 %   Physical Exam:  Constitutional: well-appearing gentleman sitting in hospital bed, in no acute distress HENT: normocephalic atraumatic, mucous membranes moist Eyes: conjunctiva non-erythematous Neck: supple Cardiovascular: regular rate and rhythm, no m/r/g Pulmonary/Chest: normal work of breathing on room air Neurological: alert & oriented x 3, diminished strength at right lower and upper extremity, dysarthria Skin: warm and dry     11/09/2021    8:27 AM 11/09/2021    6:35 AM 11/08/2021    9:39 PM  Vitals with BMI  Systolic 134 131 01/09/2022  Diastolic 87 89 88  Pulse 54 67 73      Intake/Output Summary (Last 24 hours) at 11/09/2021 1307 Last data filed at 11/09/2021 0841 Gross per 24 hour  Intake 240 ml  Output --  Net 240 ml   Net IO Since Admission: 596 mL [11/09/21 1307]  Pertinent Labs:    Latest Ref Rng & Units 11/08/2021    6:08 AM 11/07/2021    7:30 AM 06/05/2018    7:02 AM  CBC  WBC 4.0 - 10.5 K/uL 4.4  4.3  4.4   Hemoglobin 13.0 - 17.0 g/dL 08/04/2018   50.0  93.8   Hematocrit 39.0 - 52.0 % 44.5  42.1  38.2   Platelets 150 - 400 K/uL 185  249  251        Latest Ref Rng & Units 11/09/2021    2:11 AM 11/08/2021    6:08 AM 11/07/2021    7:30 AM  CMP  Glucose 70 - 99 mg/dL 01/08/2022  993  716   BUN 6 - 20 mg/dL 16  12  10    Creatinine 0.61 - 1.24 mg/dL 967   8.93   Sodium 135 - 145 mmol/L 139  141  141   Potassium 3.5 - 5.1 mmol/L 4.0  3.9  3.8   Chloride 98 - 111 mmol/L 103  107  107   CO2 22 - 32 mmol/L 23  17  24    Calcium 8.9 - 10.3 mg/dL 9.6  9.6  9.4     Imaging: No results found.  Assessment/Plan:   Principal Problem:   Multiple sclerosis pseudoexacerbation (HCC) Active Problems:   Multiple sclerosis (HCC)   Patient Summary: [name] is a 41 y.o. person living with a history of relapsing remitting MS  and HTN not currently on disease modifying therapy who presented with acute to subacute onset right sided weakness and dysarthria  and admitted for MS exacerbation.   #Relapsing remitting multiple sclerosis w/ likely exacerbation Unable to complete MRI after 3 attempts, 2 with Ativan. Increasing Ativan dose any more would carry risk of respiratory suppression. May require general anesthesia per radiology, no longer do conscious sedation. Will call daughter to discuss this given concerns for patient's understanding of importance of MRI in diagnosis and subsequent treatment. -failed to reach daughter at provided number. Will ask patient for updated number. -Solu-medrol 1 g daily for 5 days (beginning 7/10).  -Per neurology, will continue to treat with steroids and explore options for MRI before discharge. -Eventual outpatient MS specialist referral   #Asymptomatic Bradycardia Rate normal this morning. Hemodynamically stable. TSH wnl. -Patient remains asymptomatic. Will continue to follow.   #HTN Normotensive this morning.  -Continue to monitor.   Diet: NPO VTE:  lovenox IVF: None,None Code: Full   Prior to  Admission Living Arrangement: Home, living with 74 year old daughter and 61 year old son Anticipated Discharge Location: Home Barriers to Discharge: MS flare requiring IV steroids and further workup   Dispo: Admit patient to Observation with expected length of stay less than 2 midnights.   Adron Bene MD Internal Medicine Resident PGY-1 Please contact the on call pager after 5 pm and on weekends at [phone].

## 2021-11-10 DIAGNOSIS — G35 Multiple sclerosis: Secondary | ICD-10-CM | POA: Diagnosis not present

## 2021-11-10 LAB — BASIC METABOLIC PANEL
Anion gap: 11 (ref 5–15)
BUN: 14 mg/dL (ref 6–20)
CO2: 25 mmol/L (ref 22–32)
Calcium: 9.1 mg/dL (ref 8.9–10.3)
Chloride: 103 mmol/L (ref 98–111)
Creatinine, Ser: 0.75 mg/dL (ref 0.61–1.24)
GFR, Estimated: >60 mL/min (ref >60)
Glucose, Bld: 139 mg/dL — ABNORMAL HIGH (ref 70–99)
Potassium: 4.2 mmol/L (ref 3.5–5.1)
Sodium: 139 mmol/L (ref 135–145)

## 2021-11-10 LAB — GLUCOSE, CAPILLARY
Glucose-Capillary: 139 mg/dL — ABNORMAL HIGH (ref 70–99)
Glucose-Capillary: 147 mg/dL — ABNORMAL HIGH (ref 70–99)
Glucose-Capillary: 202 mg/dL — ABNORMAL HIGH (ref 70–99)

## 2021-11-10 NOTE — Progress Notes (Signed)
Mri tomorrow. Will follow after that. Will need referral to Dr. Epimenio Foot (MS specialist) at [facility] - follow up 4-6 weeks after discharge for initiation and discussion of long term disease modifying agent. Complete 5 days of IVMP.  -- Milon Dikes, MD Neurologist [facility] Pager: [phone]

## 2021-11-10 NOTE — Progress Notes (Signed)
Inpatient Rehab Admissions:  Inpatient Rehab Consult received.  I met with patient at the bedside for rehabilitation assessment and to discuss goals and expectations of an inpatient rehab admission.  Pt acknowledged understanding of CIR goals and expectations. Pt is unsure if he wants to pursue CIR. He would like to think about it. Pt requested that [facility] contact daughter, Luellen Pucker. Spoke with Luellen Pucker on the telephone. She acknowledged understanding of CIR goals and expectations. She informed AC that she will be able to provide intermittent support/supervision for pt after discharge. Will continue to follow.  Signed: Gayland Curry, [place], [place] Admissions Coordinator [phone]

## 2021-11-10 NOTE — Progress Notes (Addendum)
HD#2 Subjective:   Summary: [name] is a 41 yo male with PMHx of relapsing remitting MS and HTN who presented with lower extremity weakness, speech difficulty, and urinary incontinence.  Overnight Events: NAEO.  PT recommends CIR but patient unsure, pending conversation with daughter. He feels largely unchanged in speech and right sided weakness today. He states he doesn't have much energy and feels fatigued after talking a lot.  Objective:  Vital signs in last 24 hours: Vitals:   11/09/21 1451 11/09/21 2002 11/10/21 0302 11/10/21 0831  BP: 138/71 131/87 (!) 143/79 129/65  Pulse: 82 64 63 83  Resp: 17 16 17 17   Temp: 97.7 F (36.5 C) 97.6 F (36.4 C) (!) 97.5 F (36.4 C) 98 F (36.7 C)  TempSrc: Oral Oral Oral Oral  SpO2: 98% 96% 100% 100%   Supplemental O2: Room Air SpO2: 100 %   Physical Exam:  Constitutional: middle aged male sitting in hospital bed, in no acute distress HENT: normocephalic atraumatic, mucous membranes moist Eyes: conjunctiva non-erythematous Neck: supple Cardiovascular: regular rate and rhythm, no m/r/g Pulmonary/Chest: normal work of breathing on room air Neurological: Right upper extremity weakness similar to previous day. Right lower extremity weakness improved, now able to hold leg up against gravity. Still unable to plantar flex against resistance. Skin: warm and dry Psych: Normal mood and affect.   Intake/Output Summary (Last 24 hours) at 11/10/2021 1409 Last data filed at 11/10/2021 0900 Gross per 24 hour  Intake 240 ml  Output 1000 ml  Net -760 ml   Net IO Since Admission: -164 mL [11/10/21 1409]  Pertinent Labs:    Latest Ref Rng & Units 11/08/2021    6:08 AM 11/07/2021    7:30 AM 06/05/2018    7:02 AM  CBC  WBC 4.0 - 10.5 K/uL 4.4  4.3  4.4   Hemoglobin 13.0 - 17.0 g/dL 08/04/2018  93.2  35.5   Hematocrit 39.0 - 52.0 % 44.5  42.1  38.2   Platelets 150 - 400 K/uL 185  249  251        Latest Ref Rng & Units 11/10/2021   12:21 AM  11/09/2021    2:11 AM 11/08/2021    6:08 AM  CMP  Glucose 70 - 99 mg/dL 01/09/2022  202  542   BUN 6 - 20 mg/dL 14  16  12    Creatinine 0.61 - 1.24 mg/dL 706   2.37   Sodium 135 - 145 mmol/L 139  139  141   Potassium 3.5 - 5.1 mmol/L 4.2  4.0  3.9   Chloride 98 - 111 mmol/L 103  103  107   CO2 22 - 32 mmol/L 25  23  17    Calcium 8.9 - 10.3 mg/dL 9.1  9.6  9.6     Imaging: No results found.  Assessment/Plan:   Principal Problem:   Multiple sclerosis pseudoexacerbation (HCC) Active Problems:   Multiple sclerosis (HCC)   Patient Summary: [name] is a 41 y.o. person living with a history of relapsing remitting MS and HTN not currently on disease modifying therapy who presented with acute to subacute onset right sided weakness and dysarthria now admitted for MS exacerbation.   #Relapsing remitting multiple sclerosis w/ likely exacerbation Patient previously unable to complete MRI due to movement on scanner. Imaging will be important in assessment and guiding treatment of patient's neurological changes likely due to MS exacerbation. MRI Brain/cervical/T spine under anesthesia tomorrow at 12pm. Daughter notified.  Neurology will follow up MRI and refer to Dr. Epimenio Foot (MS specialist) at [facility] likely 4-6 weeks after discharge to discuss medical management of his MS. -Solu-medrol 1 g daily for 5 days (last dose 7/14).  -f/u MRI -PT recommending CIR, patient continuing to think about this. Will f/u.    #Asymptomatic Bradycardia Rate normal today. Hemodynamically stable. TSH wnl. -Patient remains asymptomatic. Will continue to follow.   #HTN Mildly hypertensive during admission. Not on antihypertensive at home according to chart.  -Continue to monitor. -Address need for medication at outpatient follow up   Diet: NPO VTE:  lovenox IVF: None,None Code: Full   Prior to Admission Living Arrangement: Home, living with 75 year old daughter and 37 year old son Anticipated Discharge  Location: Home Barriers to Discharge: MS flare requiring IV steroids and further workup     Adron Bene MD Internal Medicine Resident PGY-1 Please contact the on call pager after 5 pm and on weekends at [phone].

## 2021-11-10 NOTE — Progress Notes (Signed)
Speech Language Pathology Treatment: Dysphagia  Patient Details Name: [name] MRN: [id number] DOB: 04/03/1981 Today's Date: 11/10/2021 Time: 5035-4656 SLP Time Calculation (min) (ACUTE ONLY): 20 min  Assessment / Plan / Recommendation Clinical Impression  Pt agreeable and pleasant during dysphagia f/u tx session.  Discussed compensatory strategies with pt re: small sips with liquids and slow rate during consumption of all POs with pt in agreement, but stating "I don't have trouble swallowing."  Observed consumption of puree, thin via cup, and regular consistencies with min verbal cues provided for safe intake of these consistencies.  No overt s/s of aspiration present throughout trial, but slow masticatory efforts observed with speech intelligibility decreased d/t hypophonia and dysarthria d/t MS exacerbation.  Recommend continuing regular/thin liquids with intermittent supervision.  ST will s/o in acute setting.  HPI HPI: Mr. Spatafore is a 41 yo male with PMHx of relapsing remitting MS and HTN who presented with lower extremity weakness, speech difficulty, and urinary incontinence. BSE 2020 no signs aspiration and regular texture, thin liquids recommended; BSE completed on 11/08/21 with regular/thin liquids continued with small sips for safety.  ST f/u for diet tolrance.      SLP Plan  All goals met      Recommendations for follow up therapy are one component of a multi-disciplinary discharge planning process, led by the attending physician.  Recommendations may be updated based on patient status, additional functional criteria and insurance authorization.    Recommendations  Diet recommendations: Regular;Thin liquid Liquids provided via: Cup;Straw Medication Administration: Whole meds with puree Supervision: Patient able to self feed Compensations: Slow rate;Small sips/bites                Oral Care Recommendations: Oral care BID;Patient independent with oral care Follow Up  Recommendations: Follow physician's recommendations for discharge plan and follow up therapies Assistance recommended at discharge: Intermittent Supervision/Assistance SLP Visit Diagnosis: Dysphagia, oral phase (R13.11) Plan: All goals met           Elvina Sidle, M.S., CCC-SLP  11/10/2021, 12:59 PM

## 2021-11-10 NOTE — Progress Notes (Addendum)
Physical Therapy Treatment Patient Details Name: [name] MRN: [id number] DOB: 08-06-1980 Today's Date: 11/10/2021   History of Present Illness 41 yo male presented with BIL LE weakness, speech difficulty, and urinary incontinence. His symptoms became noticeable 1.5 weeks ago and began with weakness in his right lower extremity with difficulty walking. He states he's fallen 14 to 15 times in the past 2 weeks due to imbalance. PMH PMHx of relapsing remitting MS and HTN    PT Comments    Pt able to perform stand pivot transfer from bed to chair today but was unsafe to attempt gait away from bedside due to weakness with B LE's giving way, poor coordination, decreased safety awareness, and poor balance. Pt does not appear to know if he wants to pursue inpatient rehab.  Recommendations for follow up therapy are one component of a multi-disciplinary discharge planning process, led by the attending physician.  Recommendations may be updated based on patient status, additional functional criteria and insurance authorization.  Follow Up Recommendations  Acute inpatient rehab (3hours/day)     Assistance Recommended at Discharge Frequent or constant Supervision/Assistance  Patient can return home with the following A lot of help with walking and/or transfers;Help with stairs or ramp for entrance;Assist for transportation;Assistance with cooking/housework;A lot of help with bathing/dressing/bathroom;Direct supervision/assist for medications management;Direct supervision/assist for financial management   Equipment Recommendations  Other (comment) (defer to inpt rehab)    Recommendations for Other Services Rehab consult     Precautions / Restrictions Precautions Precautions: Fall Precaution Comments: condom foley Restrictions Weight Bearing Restrictions: No     Mobility  Bed Mobility Overal bed mobility: Needs Assistance Bed Mobility: Supine to Sit     Supine to sit: Min assist, +2 for  physical assistance     General bed mobility comments: Pt exiting on L side. Pt with improved static sitting balance.    Transfers Overall transfer level: Needs assistance Equipment used: Rolling walker (2 wheels) Transfers: Sit to/from Stand, Bed to chair/wheelchair/BSC Sit to Stand: Min assist, Mod assist, +2 physical assistance           General transfer comment: Pt performed side steps (2 trials) while standing at EOB using RW and required moderate assistance x 2. Pt with severe unsteadiness, B LE buckling, and poor coordination. Pt fatigued quickly on first attempt. Pt performed stand pivot transfer with moderate assistance while holding therapist's elbows.    Ambulation/Gait               General Gait Details: see above for pre-gait activities   Stairs             Wheelchair Mobility    Modified Rankin (Stroke Patients Only)       Balance Overall balance assessment: Needs assistance Sitting-balance support: Bilateral upper extremity supported Sitting balance-Leahy Scale: Poor     Standing balance support: Bilateral upper extremity supported, Reliant on assistive device for balance Standing balance-Leahy Scale: Poor                              Cognition Arousal/Alertness: Awake/alert Behavior During Therapy: Flat affect Overall Cognitive Status: Difficult to assess (cognition impaired but unsure of pt's baseline)                                 General Comments: Pt A x O but with decreased ability to follow  simple commands and lack of safety awareness present. Pt also appears to have trouble understanding how inpatient rehab rehab works.        Exercises      General Comments        Pertinent Vitals/Pain Pain Assessment Pain Assessment:  (no c/o) Pain Intervention(s): Limited activity within patient's tolerance, Monitored during session    Home Living                          Prior Function             PT Goals (current goals can now be found in the care plan section) Acute Rehab PT Goals PT Goal Formulation: With patient Time For Goal Achievement: 11/15/21 Potential to Achieve Goals: Fair Progress towards PT goals: Progressing toward goals    Frequency    Min 5X/week      PT Plan Current plan remains appropriate    Co-evaluation              AM-PAC PT "6 Clicks" Mobility   Outcome Measure  Help needed turning from your back to your side while in a flat bed without using bedrails?: Total Help needed moving from lying on your back to sitting on the side of a flat bed without using bedrails?: Total Help needed moving to and from a bed to a chair (including a wheelchair)?: Total Help needed standing up from a chair using your arms (e.g., wheelchair or bedside chair)?: A Lot Help needed to walk in hospital room?: Total Help needed climbing 3-5 steps with a railing? : Total 6 Click Score: 7    End of Session Equipment Utilized During Treatment: Gait belt Activity Tolerance: Other (comment);Patient limited by fatigue Patient left: in chair;with call bell/phone within reach;with chair alarm set Nurse Communication: Mobility status;Precautions PT Visit Diagnosis: Other abnormalities of gait and mobility (R26.89);Repeated falls (R29.6);Muscle weakness (generalized) (M62.81)     Time: 3545-6256 PT Time Calculation (min) (ACUTE ONLY): 20 min  Charges:  $Therapeutic Activity: 8-22 mins                    Tana Coast, PT    [employer] 11/10/2021, 3:30 PM

## 2021-11-11 ENCOUNTER — Inpatient Hospital Stay (HOSPITAL_COMMUNITY): Payer: Medicare HMO

## 2021-11-11 ENCOUNTER — Inpatient Hospital Stay (HOSPITAL_COMMUNITY): Payer: Medicare HMO | Admitting: Anesthesiology

## 2021-11-11 ENCOUNTER — Encounter (HOSPITAL_COMMUNITY)
Admission: EM | Disposition: A | Discharge status: Skilled Nursing Facility | Payer: Self-pay | Source: Home / Self Care | Attending: Internal Medicine

## 2021-11-11 ENCOUNTER — Encounter (HOSPITAL_COMMUNITY): Payer: Self-pay | Admitting: Internal Medicine

## 2021-11-11 DIAGNOSIS — E119 Type 2 diabetes mellitus without complications: Secondary | ICD-10-CM | POA: Diagnosis not present

## 2021-11-11 DIAGNOSIS — G35 Multiple sclerosis: Secondary | ICD-10-CM

## 2021-11-11 DIAGNOSIS — F17210 Nicotine dependence, cigarettes, uncomplicated: Secondary | ICD-10-CM

## 2021-11-11 DIAGNOSIS — I10 Essential (primary) hypertension: Secondary | ICD-10-CM

## 2021-11-11 HISTORY — PX: RADIOLOGY WITH ANESTHESIA: SHX6223

## 2021-11-11 LAB — GLUCOSE, CAPILLARY: Glucose-Capillary: 184 mg/dL — ABNORMAL HIGH (ref 70–99)

## 2021-11-11 LAB — BASIC METABOLIC PANEL
Anion gap: 10 (ref 5–15)
BUN: 20 mg/dL (ref 6–20)
CO2: 23 mmol/L (ref 22–32)
Calcium: 8.9 mg/dL (ref 8.9–10.3)
Chloride: 104 mmol/L (ref 98–111)
Creatinine, Ser: 1.06 mg/dL (ref 0.61–1.24)
GFR, Estimated: >60 mL/min (ref >60)
Glucose, Bld: 141 mg/dL — ABNORMAL HIGH (ref 70–99)
Potassium: 4.2 mmol/L (ref 3.5–5.1)
Sodium: 137 mmol/L (ref 135–145)

## 2021-11-11 SURGERY — MRI WITH ANESTHESIA
Anesthesia: General

## 2021-11-11 MED ORDER — ONDANSETRON HCL 4 MG/2ML IJ SOLN: 4.0000 mg | Freq: Once | INTRAMUSCULAR | Status: DC | PRN

## 2021-11-11 MED ORDER — ORAL CARE MOUTH RINSE: 15.0000 mL | Freq: Once | OROMUCOSAL | Status: AC

## 2021-11-11 MED ORDER — DEXAMETHASONE SODIUM PHOSPHATE 10 MG/ML IJ SOLN
INTRAMUSCULAR | Status: DC | PRN
  Administered 2021-11-11: 10 mg via INTRAVENOUS

## 2021-11-11 MED ORDER — GADOBUTROL 1 MMOL/ML IV SOLN
7.0000 mL | Freq: Once | INTRAVENOUS | Status: AC | PRN
  Administered 2021-11-11: 7 mL via INTRAVENOUS

## 2021-11-11 MED ORDER — CHLORHEXIDINE GLUCONATE 0.12 % MT SOLN
OROMUCOSAL | Status: AC
  Administered 2021-11-11: 15 mL via OROMUCOSAL
  Filled 2021-11-11: qty 15

## 2021-11-11 MED ORDER — ONDANSETRON HCL 4 MG/2ML IJ SOLN
INTRAMUSCULAR | Status: DC | PRN
  Administered 2021-11-11: 4 mg via INTRAVENOUS

## 2021-11-11 MED ORDER — PHENYLEPHRINE HCL-NACL 20-0.9 MG/250ML-% IV SOLN
INTRAVENOUS | Status: DC | PRN
  Administered 2021-11-11: 10 ug/min via INTRAVENOUS

## 2021-11-11 MED ORDER — LACTATED RINGERS IV SOLN: INTRAVENOUS | Status: DC

## 2021-11-11 MED ORDER — CHLORHEXIDINE GLUCONATE 0.12 % MT SOLN: 15.0000 mL | Freq: Once | OROMUCOSAL | Status: AC

## 2021-11-11 MED ORDER — FENTANYL CITRATE (PF) 100 MCG/2ML IJ SOLN: 25.0000 ug | INTRAMUSCULAR | Status: DC | PRN

## 2021-11-11 MED ORDER — ROCURONIUM BROMIDE 10 MG/ML (PF) SYRINGE
PREFILLED_SYRINGE | INTRAVENOUS | Status: DC | PRN
  Administered 2021-11-11: 60 mg via INTRAVENOUS

## 2021-11-11 MED ORDER — LIDOCAINE 2% (20 MG/ML) 5 ML SYRINGE
INTRAMUSCULAR | Status: DC | PRN
  Administered 2021-11-11: 80 mg via INTRAVENOUS

## 2021-11-11 MED ORDER — SUGAMMADEX SODIUM 200 MG/2ML IV SOLN
INTRAVENOUS | Status: DC | PRN
  Administered 2021-11-11: 200 mg via INTRAVENOUS

## 2021-11-11 MED ORDER — FENTANYL CITRATE (PF) 250 MCG/5ML IJ SOLN
INTRAMUSCULAR | Status: DC | PRN
  Administered 2021-11-11: 50 ug via INTRAVENOUS

## 2021-11-11 MED ORDER — MIDAZOLAM HCL 2 MG/2ML IJ SOLN
INTRAMUSCULAR | Status: DC | PRN
  Administered 2021-11-11: 2 mg via INTRAVENOUS

## 2021-11-11 MED ORDER — PROPOFOL 10 MG/ML IV BOLUS
INTRAVENOUS | Status: DC | PRN
  Administered 2021-11-11: 150 mg via INTRAVENOUS

## 2021-11-11 NOTE — Progress Notes (Signed)
OT Cancellation Note  Patient Details Name: [name] MRN: [id number] DOB: [birth year]   Cancelled Treatment:    Reason Eval/Treat Not Completed: Patient at procedure or test/ unavailable  Mateo Flow 11/11/2021, 12:42 PM

## 2021-11-11 NOTE — Progress Notes (Signed)
HD#3 Subjective:   Summary: [name] is a 41 yo male with PMHx of relapsing remitting MS and HTN who presented with lower extremity weakness, speech difficulty, and urinary incontinence.  Overnight Events: NAEO.  He states he feels okay today. He feels hungry, but currently not able to eat due to MRI this afternoon. He feels his speech and strength are about the same as well.   Objective:  Vital signs in last 24 hours: Vitals:   11/11/21 1515 11/11/21 1525 11/11/21 1530 11/11/21 1545  BP: 129/81  120/65 124/63  Pulse: 87 78 82 72  Resp: 16 15 12 15   Temp: 98.2 F (36.8 C)   98.1 F (36.7 C)  TempSrc:    Oral  SpO2: 96% 97% 96% 100%  Weight:      Height:       Supplemental O2: Room Air SpO2: 100 %   Physical Exam:  Constitutional: well-appearing male sitting in hospital bed, in no acute distress HENT: normocephalic atraumatic, mucous membranes moist Eyes: conjunctiva non-erythematous Neck: supple Cardiovascular: regular rate and rhythm, no m/r/g Pulmonary/Chest: normal work of breathing on room air, lungs clear to auscultation bilaterally MSK: normal bulk and tone Neurological: difficulty with finger to nose on right, halting speech, 4/5 right upper extremity strength, 4/5 strength right lower extremity strength Skin: warm and dry Psych: Normal mood and affect.  Filed Weights   11/11/21 1052  Weight: 78.2 kg     Intake/Output Summary (Last 24 hours) at 11/11/2021 1623 Last data filed at 11/11/2021 1516 Gross per 24 hour  Intake 600 ml  Output 400 ml  Net 200 ml   Net IO Since Admission: 36 mL [11/11/21 1623]  Pertinent Labs:    Latest Ref Rng & Units 11/08/2021    6:08 AM 11/07/2021    7:30 AM 06/05/2018    7:02 AM  CBC  WBC 4.0 - 10.5 K/uL 4.4  4.3  4.4   Hemoglobin 13.0 - 17.0 g/dL 08/04/2018  19.5  09.3   Hematocrit 39.0 - 52.0 % 44.5  42.1  38.2   Platelets 150 - 400 K/uL 185  249  251        Latest Ref Rng & Units 11/11/2021   12:40 AM 11/10/2021    12:21 AM 11/09/2021    2:11 AM  CMP  Glucose 70 - 99 mg/dL 01/10/2022  124  580   BUN 6 - 20 mg/dL 20  14  16    Creatinine 0.61 - 1.24 mg/dL 998   3.38   Sodium 135 - 145 mmol/L 137  139  139   Potassium 3.5 - 5.1 mmol/L 4.2  4.2  4.0   Chloride 98 - 111 mmol/L 104  103  103   CO2 22 - 32 mmol/L 23  25  23    Calcium 8.9 - 10.3 mg/dL 8.9  9.1  9.6     Imaging: MR Brain W and Wo Contrast  Result Date: 11/11/2021 CLINICAL DATA:  Multiple sclerosis EXAM: MRI HEAD WITHOUT AND WITH CONTRAST TECHNIQUE: Multiplanar, multiecho pulse sequences of the brain and surrounding structures were obtained without and with intravenous contrast. CONTRAST:  69mL GADAVIST GADOBUTROL 1 MMOL/ML IV SOLN COMPARISON:  January 2020 FINDINGS: Brain: Patchy and confluent areas of T2 hyperintensity in the supratentorial periventricular greater than subcortical Marthann Abshier matter, brainstem, and to a lesser extent thalamus and cerebellum. This has progressed since 2020. Prominence of the ventricles reflects ex vacuo dilatation. There are numerous marked T1 hypointense lesions  reflecting "black holes." There is mild enhancement at the deep margin of a left parietotemporal subcortical lesion (series 9, image 32). There is also patchy enhancement in the pericallosal Anica Alcaraz matter (for example see series 12, image 32). Left centrum semiovale enhancing lesion on series 32, image 15. Faint enhancement seen at the margins of a few other lesions for example adjacent to the right occipital horn on series 32, image 6. No acute infarction or hemorrhage. No intracranial mass or mass effect. No hydrocephalus or extra-axial collection. Vascular: Major vessel flow voids at the skull base are preserved. Skull and upper cervical spine: Normal marrow signal is preserved. Sinuses/Orbits: Minor mucosal thickening.  Orbits are unremarkable. Other: Sella is unremarkable.  Mastoid air cells are clear. IMPRESSION: Progression of severe demyelinating disease since  2020. Few areas of enhancement likely reflecting active demyelination. Associated cavitation and volume loss. Electronically Signed   By: Guadlupe Spanish M.D.   On: 11/11/2021 15:50   MR Cervical Spine W or Wo Contrast  Result Date: 11/11/2021 CLINICAL DATA:  Multiple sclerosis EXAM: MRI CERVICAL AND THORACIC SPINE WITHOUT AND WITH CONTRAST TECHNIQUE: Multiplanar and multiecho pulse sequences of the cervical spine, to include the craniocervical junction and cervicothoracic junction, and the thoracic spine, were obtained without and with intravenous contrast. CONTRAST:  34mL GADAVIST GADOBUTROL 1 MMOL/ML IV SOLN COMPARISON:  Cervical spine 2019, thoracic spine 2018 FINDINGS: MRI CERVICAL SPINE FINDINGS Alignment: Preserved. Vertebrae: Vertebral body heights are maintained. No marrow edema. No suspicious osseous lesion. Cord: Multiple foci of abnormal cord signal without enhancement. Suspect mild progression although current study as greater sensitivity. Posterior Fossa, vertebral arteries, paraspinal tissues: Retained secretions in the pharynx. Disc levels: Intervertebral disc heights and signal are maintained. No canal or foraminal stenosis at any level. MRI THORACIC SPINE FINDINGS Alignment:  Preserved. Vertebrae: Vertebral body heights are maintained. No marrow edema. No suspicious osseous lesion. Cord: Multiple foci of abnormal cord signal without enhancement. Burden is increased since 2018. Paraspinal and other soft tissues: Unremarkable. Disc levels: Intervertebral disc heights and signal are maintained. There is no canal or foraminal stenosis at any level. IMPRESSION: Progression of demyelinating disease since prior studies of 2018 and 2019. No evidence of active demyelination. Electronically Signed   By: Guadlupe Spanish M.D.   On: 11/11/2021 15:44   MR THORACIC SPINE W WO CONTRAST  Result Date: 11/11/2021 CLINICAL DATA:  Multiple sclerosis EXAM: MRI CERVICAL AND THORACIC SPINE WITHOUT AND WITH CONTRAST  TECHNIQUE: Multiplanar and multiecho pulse sequences of the cervical spine, to include the craniocervical junction and cervicothoracic junction, and the thoracic spine, were obtained without and with intravenous contrast. CONTRAST:  67mL GADAVIST GADOBUTROL 1 MMOL/ML IV SOLN COMPARISON:  Cervical spine 2019, thoracic spine 2018 FINDINGS: MRI CERVICAL SPINE FINDINGS Alignment: Preserved. Vertebrae: Vertebral body heights are maintained. No marrow edema. No suspicious osseous lesion. Cord: Multiple foci of abnormal cord signal without enhancement. Suspect mild progression although current study as greater sensitivity. Posterior Fossa, vertebral arteries, paraspinal tissues: Retained secretions in the pharynx. Disc levels: Intervertebral disc heights and signal are maintained. No canal or foraminal stenosis at any level. MRI THORACIC SPINE FINDINGS Alignment:  Preserved. Vertebrae: Vertebral body heights are maintained. No marrow edema. No suspicious osseous lesion. Cord: Multiple foci of abnormal cord signal without enhancement. Burden is increased since 2018. Paraspinal and other soft tissues: Unremarkable. Disc levels: Intervertebral disc heights and signal are maintained. There is no canal or foraminal stenosis at any level. IMPRESSION: Progression of demyelinating disease since prior studies  of 2018 and 2019. No evidence of active demyelination. Electronically Signed   By: Guadlupe Spanish M.D.   On: 11/11/2021 15:44    Assessment/Plan:   Principal Problem:   Multiple sclerosis pseudoexacerbation (HCC) Active Problems:   Multiple sclerosis (HCC)   Patient Summary: [name] is a 41 y.o. person living with a history of relapsing remitting MS and HTN not currently on disease modifying therapy who presented with acute to subacute onset right sided weakness and dysarthria now admitted for MS exacerbation.   #Relapsing remitting multiple sclerosis w/ likely exacerbation History of relapsing remitting MS  no currently on disease modifying therapy. Continues to have right sided weakness with speech difficulty and urinary incontinence. Patient underwent MRI brain C-spine T-spine today demonstrating progression of demyelinating disease of brain and spine.  -appreciate neurology recommendations -completed 5/5 days IV methylprednisolone  -outpatient f/u with Dr. Epimenio Foot in 1 month -PT/OT  #Asymptomatic Bradycardia Rate largely normal today. Hemodynamically stable. TSH wnl. -Patient remains asymptomatic.    #HTN Mildly hypertensive during admission. Not on antihypertensive at home according to chart.  -Address need for medication at outpatient follow up   Diet: NPO VTE:  lovenox IVF: None,None Code: Full   Prior to Admission Living Arrangement: Home, living with 49 year old daughter and 30 year old son Anticipated Discharge Location: Home Barriers to Discharge: MS flare requiring IV steroids and further workup  Adron Bene MD Internal Medicine Resident PGY-1 Please contact the on call pager after 5 pm and on weekends at [phone].

## 2021-11-11 NOTE — Anesthesia Postprocedure Evaluation (Signed)
Anesthesia Post Note  Patient: [name]  Procedure(s) Performed: MRI WITH ANESTHESIA BRAIN MRI WITH T SPINE AND CERVICAL SPINE W/O CONTRAST     Patient location during evaluation: PACU Anesthesia Type: General Level of consciousness: awake and alert Pain management: pain level controlled Vital Signs Assessment: post-procedure vital signs reviewed and stable Respiratory status: spontaneous breathing, nonlabored ventilation, respiratory function stable and patient connected to nasal cannula oxygen Cardiovascular status: blood pressure returned to baseline and stable Postop Assessment: no apparent nausea or vomiting Anesthetic complications: no   No notable events documented.  Last Vitals:  Vitals:   11/11/21 1530 11/11/21 1545  BP: 120/65 124/63  Pulse: 82 72  Resp: 12 15  Temp:  36.7 C  SpO2: 96% 100%    Last Pain:  Vitals:   11/11/21 1545  TempSrc: Oral  PainSc:                  Gayathri Futrell S

## 2021-11-11 NOTE — Anesthesia Preprocedure Evaluation (Addendum)
Anesthesia Evaluation  Patient identified by MRN, date of birth, ID band Patient awake    Reviewed: Allergy & Precautions, NPO status , Patient's Chart, lab work & pertinent test results  Airway Mallampati: II  TM Distance: >3 FB Neck ROM: Full    Dental no notable dental hx.    Pulmonary Current Smoker,    Pulmonary exam normal breath sounds clear to auscultation       Cardiovascular hypertension, Normal cardiovascular exam Rhythm:Regular Rate:Normal  Sinus bradycardia Confirmed by Cathren Laine ([id number]) on 11/08/2021 3:01:35 PM   Neuro/Psych PSYCHIATRIC DISORDERS Depression Multiple Sclerosis    GI/Hepatic negative GI ROS, (+)     substance abuse  marijuana use,   Endo/Other  diabetes  Renal/GU negative Renal ROS  negative genitourinary   Musculoskeletal negative musculoskeletal ROS (+)   Abdominal   Peds negative pediatric ROS (+)  Hematology negative hematology ROS (+)   Anesthesia Other Findings   Reproductive/Obstetrics                            Anesthesia Physical Anesthesia Plan  ASA: 3  Anesthesia Plan: General   Post-op Pain Management:    Induction: Intravenous  PONV Risk Score and Plan: Treatment may vary due to age or medical condition  Airway Management Planned: Oral ETT  Additional Equipment: None  Intra-op Plan:   Post-operative Plan: Extubation in OR  Informed Consent: I have reviewed the patients History and Physical, chart, labs and discussed the procedure including the risks, benefits and alternatives for the proposed anesthesia with the patient or authorized representative who has indicated his/her understanding and acceptance.     Dental advisory given  Plan Discussed with: CRNA and Surgeon  Anesthesia Plan Comments:       Anesthesia Quick Evaluation

## 2021-11-11 NOTE — Care Management Important Message (Signed)
Important Message  Patient Details  Name: [name] MRN: [id number] Date of Birth: 06/30/1980   Medicare Important Message Given:  Yes     Denora Wysocki 11/11/2021, 4:10 PM

## 2021-11-11 NOTE — Anesthesia Procedure Notes (Signed)
Procedure Name: Intubation Date/Time: 11/11/2021 1:15 PM  Performed by: Aundria Rud, CRNAPre-anesthesia Checklist: Patient identified, Emergency Drugs available, Suction available and Patient being monitored Patient Re-evaluated:Patient Re-evaluated prior to induction Oxygen Delivery Method: Circle System Utilized Preoxygenation: Pre-oxygenation with 100% oxygen Induction Type: IV induction Ventilation: Mask ventilation without difficulty Laryngoscope Size: Glidescope and 4 Grade View: Grade I Tube type: Oral Tube size: 7.5 mm Number of attempts: 1 Airway Equipment and Method: Stylet and Oral airway Placement Confirmation: ETT inserted through vocal cords under direct vision, positive ETCO2 and breath sounds checked- equal and bilateral Secured at: 22 cm Tube secured with: Tape Dental Injury: Teeth and Oropharynx as per pre-operative assessment

## 2021-11-11 NOTE — Progress Notes (Signed)
Met with the patient again after MRI. Discussed the findings of progression and high lesion burden load in his brain, cervical and thoracic spine of demyelinating lesions. He seems amenable for treatment but somehow is unable to follow-up outpatient. The primary team resident has made efforts to call the [place] [facility] to secure appointments. I have also requested that they make arrangements to follow him up in clinic so that he does not get lost to follow-up again, which they will try to do. Rest of the recommendations as in the progress note from earlier in the day. Appreciate the teaching service care of this patient.  -- Amie [place], MD Neurologist [facility] Pager: [phone]

## 2021-11-11 NOTE — Transfer of Care (Signed)
Immediate Anesthesia Transfer of Care Note  Patient: [name]  Procedure(s) Performed: MRI WITH ANESTHESIA BRAIN MRI WITH T SPINE AND CERVICAL SPINE W/O CONTRAST  Patient Location: PACU  Anesthesia Type:General  Level of Consciousness: awake, alert  and oriented  Airway & Oxygen Therapy: Patient Spontanous Breathing and Patient connected to face mask oxygen  Post-op Assessment: Report given to RN, Post -op Vital signs reviewed and stable and Patient moving all extremities X 4  Post vital signs: Reviewed and stable  Last Vitals:  Vitals Value Taken Time  BP 129/81 11/11/21 1516  Temp 36.8 C 11/11/21 1515  Pulse 78 11/11/21 1524  Resp 15 11/11/21 1524  SpO2 97 % 11/11/21 1524  Vitals shown include unvalidated device data.  Last Pain:  Vitals:   11/11/21 1515  TempSrc:   PainSc: 0-No pain         Complications: No notable events documented.

## 2021-11-11 NOTE — Progress Notes (Addendum)
PT Cancellation Note  Patient Details Name: [name] MRN: [id number] DOB: 05-25-1980   Cancelled Treatment:    Reason Eval/Treat Not Completed: Patient at procedure or test/unavailable. Pt at MRI. PT will continue to follow.   Tana Coast 11/11/2021, 2:47 PM

## 2021-11-11 NOTE — Progress Notes (Addendum)
Subjective: Patient seen in room with no family at the bedside.  He continues to endorse bladder difficulties with urinary urgency and incontinence.  He is agreeable to getting MRI under sedation later today.  PT recommends CIR.  Exam: Vitals:   11/10/21 2005 11/11/21 0342  BP: 129/75 139/81  Pulse: (!) 56 70  Resp: 20 18  Temp: 98.2 F (36.8 C) 98 F (36.7 C)  SpO2: 96% 100%   Gen: In bed, NAD Resp: non-labored breathing, no acute distress  Neuro: Awake alert oriented x3 No dysarthria. No evidence of aphasia, but speech is halting and in short phrases with impaired fluency Cranial nerves: Pupils equal round react light, extra movements intact, face symmetric, tongue midline. Motor examination with no drift in the upper extremities, 4/5 in LUE, 4-/5 in RUE with coarse tremor  Lower extremities are antigravity without drift 4/5 with coarse tremor Patient reports numbness in right hand. Sensation intact in bilateral arms, legs, and feet. Cerebellar:  FTN and HKS intact bilaterally DTRs: Brisk in the upper and lower extremities.  Pertinent Labs: BMP unremarkable  Assessment:  41 year old with relapsing remitting MS, was on Tysabri at 1 point and lastly on [place] but did not elect to continue treatment with disease modifying agents for the past 4 to 5 years presenting for progressive gait instability, lower extremity weakness and now difficulty with speech, swallowing as well as urinary incontinence. Looks like his MS is becoming more progressive and might get to the progressive relapsing phase rather than truly relapsing remitting. At this point, it might not be unreasonable to treat him with steroids irrespective of whether he has abnormal enhancement on scans or not. Eventually he will need outpatient MS specialist for long-term disease modifying therapy to manage his multiple sclerosis.  Impression:  MS exacerbation  Recommendations: - Continue methylprednisolone 1000mg  for 5  days (day 5/5) - Will ensure primary team is aware of patient request for home health vs. Discharge to [facility] - Outpatient neurology follow up for MS disease modifying therapy - DR. Sater at [facility]  Patient seen and examined by NP/APP with MD. MD to update note as needed.   Cortney E [facility] , MSN, AGACNP-BC [facility] See Amion for schedule and pager information 11/11/2021 9:05 AM   Attending Neurohospitalist Addendum Agree with the history and physical as documented above. Agree with the plan as documented, which I helped formulate. I have independently reviewed the chart, obtained history, review of systems and examined the patient.I have personally reviewed pertinent head/neck/spine imaging (CT/MRI). MRI of the brain C and T-spine reviewed. Progression of severe demyelinating disease in the brain since 2020 with few areas of enhancement which likely reflects active demyelination along with associated cavitation and volume loss. In the spine also, progression of demyelinating disease since prior studies of 2018 in 2019 without any evidence of active myelination.    Updated assessment 41 year old with relapsing remitting MS who was previously on Tysabri and then on 46 presenting with multiple years of progressive gait instability, lower extremity weakness and now difficulty with speech swallowing as well as urinary incontinence.  Clinically looks like he has an MS exacerbation.  Was unable to tolerate multiple attempts for getting an MRI and eventually had to be intubated to get an MRI under general anesthesia.  Review of images reveals progression of his demyelinating disease burden both in the brain and in the spine-cervical and thoracic with mild enhancement indicating active demyelination in the brain. He will be  completing 5 days of IV steroids.  He will need long-term disease modifying treatment with outpatient MS specialist for slowing down  the progression of his disease-already is extremely disabled at this time. Has been noncompliant to medications and follow-up before because "I do not like to take medications". We have counseled him extensively to be compliant to medications.   Updated recommendations Complete 5 days of IV steroids PT OT Outpatient follow-up with Dr. Epimenio Foot for disease modifying treatment. Appreciate primary team's efforts in securing appointments for him. Dispo per PT/OT recs by primary team Please feel free to call with any questions.  -- Milon Dikes, MD Neurologist [facility] Pager: [phone]

## 2021-11-12 DIAGNOSIS — G35 Multiple sclerosis: Secondary | ICD-10-CM | POA: Diagnosis not present

## 2021-11-12 NOTE — Progress Notes (Addendum)
Physical Therapy Treatment Patient Details Name: [name] MRN: [id number] DOB: [birth year] Today's Date: 11/12/2021   History of Present Illness 41 yo male presented with BIL LE weakness, speech difficulty, and urinary incontinence. His symptoms became noticeable 1.5 weeks ago and began with weakness in his right lower extremity with difficulty walking. He states he's fallen 14 to 15 times in the past 2 weeks due to imbalance. PMH PMHx of relapsing remitting MS and HTN    PT Comments    Pt with improved standing balance today using RW and short distance of gait performed with chair follow after step pivot transfer to chair. Pt states he would now be willing to go to inpatient rehab although he frequently changes his mind and appears confused. Pt also now reports he needs a new wheelchair. Family was contacted to determine what equipment pt owns due to pt not knowing (pt has shower seat, RW, and non functioning WC).    Recommendations for follow up therapy are one component of a multi-disciplinary discharge planning process, led by the attending physician.  Recommendations may be updated based on patient status, additional functional criteria and insurance authorization.  Follow Up Recommendations  Acute inpatient rehab (3hours/day) (pt reconsidering)     Assistance Recommended at Discharge Frequent or constant Supervision/Assistance  Patient can return home with the following A lot of help with walking and/or transfers;Help with stairs or ramp for entrance;Assist for transportation;Assistance with cooking/housework;A lot of help with bathing/dressing/bathroom;Direct supervision/assist for medications management;Direct supervision/assist for financial management   Equipment Recommendations  Wheelchair (measurements PT);Wheelchair cushion (measurements PT)    Recommendations for Other Services Rehab consult     Precautions / Restrictions Precautions Precautions: Fall Precaution  Comments: condom foley Restrictions Weight Bearing Restrictions: No     Mobility  Bed Mobility Overal bed mobility: Needs Assistance Bed Mobility: Supine to Sit     Supine to sit: Supervision     General bed mobility comments: Pt maintaining static sitting balance well w/o assist.    Transfers Overall transfer level: Needs assistance Equipment used: Rolling walker (2 wheels) Transfers: Sit to/from Stand, Bed to chair/wheelchair/BSC Sit to Stand: Min assist, Min guard   Step pivot transfers: Min assist       General transfer comment: Pt performed three sit <> stands during this encounter (one for pre-gait, one for gait, and one following gait from chair to position chair alarm). Pt performed pre-gait activities at bedside including weight shifts M <> L, forwards/backwards stepping in place, and lateral steps. Pt required increased cues for RW position. Pt with decreased ability to extend hips and knees during sit to stands.    Ambulation/Gait Ambulation/Gait assistance: Mod assist, +2 safety/equipment Gait Distance (Feet): 6 Feet Assistive device: Rolling walker (2 wheels) Gait Pattern/deviations: Knee flexed in stance - left, Knee flexed in stance - right, Ataxic, Drifts right/left       General Gait Details: Pt with ataxia and poor endurance. Chair followed pt. Pt unable to maintain RW in appropriate position (began to bring it way out in front).   Stairs             Wheelchair Mobility    Modified Rankin (Stroke Patients Only)       Balance Overall balance assessment: Needs assistance Sitting-balance support: Bilateral upper extremity supported Sitting balance-Leahy Scale: Poor     Standing balance support: Bilateral upper extremity supported, Reliant on assistive device for balance Standing balance-Leahy Scale: Poor  Cognition Arousal/Alertness: Awake/alert Behavior During Therapy: Flat affect Overall  Cognitive Status: Difficult to assess (cognition impaired but unsure of pt's baseline)                                 General Comments: Pt A x O but with decreased ability to follow simple commands and lack of safety awareness present. Pt also a poor historian and unable to determine what equipment he has at home.        Exercises General Exercises - Lower Extremity Hip Flexion/Marching: AAROM, AROM, Both, 5 reps, Seated (pt performed foot taps to object with more assistance required on R LE for coordination and strengthening)    General Comments        Pertinent Vitals/Pain Pain Assessment Pain Assessment: No/denies pain    Home Living                          Prior Function            PT Goals (current goals can now be found in the care plan section) Acute Rehab PT Goals PT Goal Formulation: With patient Time For Goal Achievement: 11/15/21 Potential to Achieve Goals: Fair Progress towards PT goals: Progressing toward goals    Frequency    Min 5X/week      PT Plan Current plan remains appropriate    Co-evaluation              AM-PAC PT "6 Clicks" Mobility   Outcome Measure  Help needed turning from your back to your side while in a flat bed without using bedrails?: Total Help needed moving from lying on your back to sitting on the side of a flat bed without using bedrails?: Total Help needed moving to and from a bed to a chair (including a wheelchair)?: Total Help needed standing up from a chair using your arms (e.g., wheelchair or bedside chair)?: A Lot Help needed to walk in hospital room?: Total Help needed climbing 3-5 steps with a railing? : Total 6 Click Score: 7    End of Session Equipment Utilized During Treatment: Gait belt Activity Tolerance: Patient limited by fatigue Patient left: in chair;with call bell/phone within reach;with chair alarm set Nurse Communication: Mobility status;Precautions PT Visit Diagnosis:  Other abnormalities of gait and mobility (R26.89);Repeated falls (R29.6);Muscle weakness (generalized) (M62.81)     Time: 1610-9604 PT Time Calculation (min) (ACUTE ONLY): 29 min  Charges:  $Gait Training: 8-22 mins $Therapeutic Activity: 8-22 mins                     Tana Coast, PT    [employer] 11/12/2021, 12:01 PM

## 2021-11-12 NOTE — Progress Notes (Addendum)
Inpatient Rehab Admissions Coordinator:  Spoke with pt at bedside. Pt is interested in pursuing CIR. Discussed support pt may have when he discharges. He would only like his daughter, Magda Paganini to assist him. We discussed that she can only provide intermittent assistance since she works during the daytime. Pt insistent that he does not want to involve his spouse. Will discuss pt's dispo with the physiatrists. Will continue to follow.  Wolfgang Phoenix, MS, CCC-SLP Admissions Coordinator [phone]

## 2021-11-12 NOTE — Progress Notes (Signed)
HD#4 Subjective:   Summary: Mr. Showers is a 41 yo male with PMHx of relapsing remitting MS and HTN who presented with lower extremity weakness, speech difficulty, and urinary incontinence.  Overnight Events: NAEO.  Patient states he feels about as good as he expected at this point. Feels like his energy would benefit from drinking some redbulls. Seems frustrated that he cannot get these while he is here. He states he would like to be treated for his MS but does not want to take anything like what he has before given it made him sick. He is now amenable to inpatient rehab. He is also amenable to f/u with MS specialist.  Objective:  Vital signs in last 24 hours: Vitals:   11/11/21 1545 11/11/21 2119 11/12/21 0633 11/12/21 0809  BP: 124/63 117/65 124/81 134/80  Pulse: 72 (!) 54 (!) 59 94  Resp: 15 14 18 18   Temp: 98.1 F (36.7 C) 98.7 F (37.1 C) 97.9 F (36.6 C) 97.8 F (36.6 C)  TempSrc: Oral Oral    SpO2: 100% 100% 98% 98%  Weight:      Height:       Supplemental O2: Room Air SpO2: 98 %   Physical Exam:  Constitutional: well-appearing male sitting in hospital bed, in no acute distress HENT: normocephalic atraumatic, mucous membranes moist Eyes: conjunctiva non-erythematous Neck: supple Cardiovascular: regular rate and rhythm, no m/r/g Pulmonary/Chest: normal work of breathing on room air, lungs clear to auscultation bilaterally MSK: normal bulk and tone Neurological: improved finger to nose on right, halting speech similar to before, 4/5 right upper extremity strength, 4/5 strength right lower extremity strength  Skin: warm and dry Psych: Normal mood and affect.  Filed Weights   11/11/21 1052  Weight: 78.2 kg     Intake/Output Summary (Last 24 hours) at 11/12/2021 1230 Last data filed at 11/12/2021 1042 Gross per 24 hour  Intake 1060 ml  Output 1000 ml  Net 60 ml   Net IO Since Admission: -104 mL [11/12/21 1230]  Pertinent Labs:    Latest Ref Rng & Units  11/08/2021    6:08 AM 11/07/2021    7:30 AM 06/05/2018    7:02 AM  CBC  WBC 4.0 - 10.5 K/uL 4.4  4.3  4.4   Hemoglobin 13.0 - 17.0 g/dL 08/04/2018  10.9  32.3   Hematocrit 39.0 - 52.0 % 44.5  42.1  38.2   Platelets 150 - 400 K/uL 185  249  251        Latest Ref Rng & Units 11/11/2021   12:40 AM 11/10/2021   12:21 AM 11/09/2021    2:11 AM  CMP  Glucose 70 - 99 mg/dL 01/10/2022  322  025   BUN 6 - 20 mg/dL 20  14  16    Creatinine 0.61 - 1.24 mg/dL 427   0.62   Sodium 135 - 145 mmol/L 137  139  139   Potassium 3.5 - 5.1 mmol/L 4.2  4.2  4.0   Chloride 98 - 111 mmol/L 104  103  103   CO2 22 - 32 mmol/L 23  25  23    Calcium 8.9 - 10.3 mg/dL 8.9  9.1  9.6     Imaging: MR Brain W and Wo Contrast  Result Date: 11/11/2021 CLINICAL DATA:  Multiple sclerosis EXAM: MRI HEAD WITHOUT AND WITH CONTRAST TECHNIQUE: Multiplanar, multiecho pulse sequences of the brain and surrounding structures were obtained without and with intravenous contrast. CONTRAST:  73mL GADAVIST  GADOBUTROL 1 MMOL/ML IV SOLN COMPARISON:  January 2020 FINDINGS: Brain: Patchy and confluent areas of T2 hyperintensity in the supratentorial periventricular greater than subcortical [place] matter, brainstem, and to a lesser extent thalamus and cerebellum. This has progressed since 2020. Prominence of the ventricles reflects ex vacuo dilatation. There are numerous marked T1 hypointense lesions reflecting "black holes." There is mild enhancement at the deep margin of a left parietotemporal subcortical lesion (series 9, image 32). There is also patchy enhancement in the pericallosal [place] matter (for example see series 12, image 32). Left centrum semiovale enhancing lesion on series 32, image 15. Faint enhancement seen at the margins of a few other lesions for example adjacent to the right occipital horn on series 32, image 6. No acute infarction or hemorrhage. No intracranial mass or mass effect. No hydrocephalus or extra-axial collection. Vascular: Major  vessel flow voids at the skull base are preserved. Skull and upper cervical spine: Normal marrow signal is preserved. Sinuses/Orbits: Minor mucosal thickening.  Orbits are unremarkable. Other: Sella is unremarkable.  Mastoid air cells are clear. IMPRESSION: Progression of severe demyelinating disease since 2020. Few areas of enhancement likely reflecting active demyelination. Associated cavitation and volume loss. Electronically Signed   By: Macy Mis M.D.   On: 11/11/2021 15:50   MR Cervical Spine W or Wo Contrast  Result Date: 11/11/2021 CLINICAL DATA:  Multiple sclerosis EXAM: MRI CERVICAL AND THORACIC SPINE WITHOUT AND WITH CONTRAST TECHNIQUE: Multiplanar and multiecho pulse sequences of the cervical spine, to include the craniocervical junction and cervicothoracic junction, and the thoracic spine, were obtained without and with intravenous contrast. CONTRAST:  33mL GADAVIST GADOBUTROL 1 MMOL/ML IV SOLN COMPARISON:  Cervical spine 2019, thoracic spine 2018 FINDINGS: MRI CERVICAL SPINE FINDINGS Alignment: Preserved. Vertebrae: Vertebral body heights are maintained. No marrow edema. No suspicious osseous lesion. Cord: Multiple foci of abnormal cord signal without enhancement. Suspect mild progression although current study as greater sensitivity. Posterior Fossa, vertebral arteries, paraspinal tissues: Retained secretions in the pharynx. Disc levels: Intervertebral disc heights and signal are maintained. No canal or foraminal stenosis at any level. MRI THORACIC SPINE FINDINGS Alignment:  Preserved. Vertebrae: Vertebral body heights are maintained. No marrow edema. No suspicious osseous lesion. Cord: Multiple foci of abnormal cord signal without enhancement. Burden is increased since 2018. Paraspinal and other soft tissues: Unremarkable. Disc levels: Intervertebral disc heights and signal are maintained. There is no canal or foraminal stenosis at any level. IMPRESSION: Progression of demyelinating disease  since prior studies of 2018 and 2019. No evidence of active demyelination. Electronically Signed   By: Macy Mis M.D.   On: 11/11/2021 15:44   MR THORACIC SPINE W WO CONTRAST  Result Date: 11/11/2021 CLINICAL DATA:  Multiple sclerosis EXAM: MRI CERVICAL AND THORACIC SPINE WITHOUT AND WITH CONTRAST TECHNIQUE: Multiplanar and multiecho pulse sequences of the cervical spine, to include the craniocervical junction and cervicothoracic junction, and the thoracic spine, were obtained without and with intravenous contrast. CONTRAST:  59mL GADAVIST GADOBUTROL 1 MMOL/ML IV SOLN COMPARISON:  Cervical spine 2019, thoracic spine 2018 FINDINGS: MRI CERVICAL SPINE FINDINGS Alignment: Preserved. Vertebrae: Vertebral body heights are maintained. No marrow edema. No suspicious osseous lesion. Cord: Multiple foci of abnormal cord signal without enhancement. Suspect mild progression although current study as greater sensitivity. Posterior Fossa, vertebral arteries, paraspinal tissues: Retained secretions in the pharynx. Disc levels: Intervertebral disc heights and signal are maintained. No canal or foraminal stenosis at any level. MRI THORACIC SPINE FINDINGS Alignment:  Preserved. Vertebrae: Vertebral body heights  are maintained. No marrow edema. No suspicious osseous lesion. Cord: Multiple foci of abnormal cord signal without enhancement. Burden is increased since 2018. Paraspinal and other soft tissues: Unremarkable. Disc levels: Intervertebral disc heights and signal are maintained. There is no canal or foraminal stenosis at any level. IMPRESSION: Progression of demyelinating disease since prior studies of 2018 and 2019. No evidence of active demyelination. Electronically Signed   By: Guadlupe Spanish M.D.   On: 11/11/2021 15:44    Assessment/Plan:   Principal Problem:   Multiple sclerosis pseudoexacerbation (HCC) Active Problems:   Multiple sclerosis (HCC)   Patient Summary: [name] is a 41 y.o. person living  with a history of relapsing remitting MS and HTN not currently on disease modifying therapy who presented with acute to subacute onset right sided weakness and dysarthria now admitted for MS exacerbation.   #Relapsing remitting multiple sclerosis w/ likely exacerbation History of relapsing remitting MS no currently on disease modifying therapy. Continues to have right sided weakness with speech difficulty and urinary incontinence. Patient underwent MRI brain C-spine T-spine 11/10/21 demonstrating progression of demyelinating disease of brain and spine. His deficits have demonstrated minimal improvement despite completion of IV steroid course.  -appreciate neurology recommendations -completed 5/5 days IV methylprednisolone  -outpatient f/u with Dr. Epimenio Foot in 1 month. Will call to schedule on Monday -PT recommends inpatient rehab. CIR currently working on dispo. -f/u with [facility] 1-2 weeks after discharge to establish care  #Asymptomatic Bradycardia Hemodynamically stable. TSH wnl. -Patient remains asymptomatic.    #HTN Mildly hypertensive during admission. Not on antihypertensive at home according to chart.  -Address need for medication at outpatient follow up   Diet: NPO VTE:  lovenox IVF: None,None Code: Full   Prior to Admission Living Arrangement: Home, living with 44 year old daughter and 41 year old son Anticipated Discharge Location: Home Barriers to Discharge: MS flare requiring IV steroids and further workup  Adron Bene MD Internal Medicine Resident PGY-1 Please contact the on call pager after 5 pm and on weekends at [phone].

## 2021-11-13 NOTE — Progress Notes (Signed)
HD#5 Subjective:   Summary: Mr. Chavarin is a 41 yo male with PMHx of relapsing remitting MS and HTN who presented with lower extremity weakness, speech difficulty, and urinary incontinence.  Overnight Events: NAEO.  Doing okay today. He is still deciding between CIR and SNF vs home. States he wants to try to avoid causing more harm to himself. Discussed rehabilitation with CIR vs SNF to regain strength. Otherwise, no concerns or complaints today.  Objective:  Vital signs in last 24 hours: Vitals:   11/12/21 2011 11/13/21 0454 11/13/21 0913 11/13/21 1620  BP: 125/67 118/73 140/90 127/87  Pulse: 72 62 68 72  Resp: 17 17 17 16   Temp: (!) 97.5 F (36.4 C) 98.3 F (36.8 C) 98 F (36.7 C) 98.2 F (36.8 C)  TempSrc: Oral Oral Oral Oral  SpO2: 96% 97% 100% 97%  Weight:      Height:       Supplemental O2: Room Air SpO2: 97 %   Physical Exam:  General: well-appearing male, sitting in bed, NAD. CV: normal rate and regular rhythm. Pulm: CTABL, normal WOB on RA. Neuro: 4/5 RUE strength, 4/5 RLE strength. Left side 5/5 strength. Still having some difficulty with speech but improving gradually. Skin: warm and dry.  Filed Weights   11/11/21 1052  Weight: 78.2 kg     Intake/Output Summary (Last 24 hours) at 11/13/2021 1827 Last data filed at 11/13/2021 0459 Gross per 24 hour  Intake 120 ml  Output 550 ml  Net -430 ml   Net IO Since Admission: -534 mL [11/13/21 1827]  Pertinent Labs:    Latest Ref Rng & Units 11/08/2021    6:08 AM 11/07/2021    7:30 AM 06/05/2018    7:02 AM  CBC  WBC 4.0 - 10.5 K/uL 4.4  4.3  4.4   Hemoglobin 13.0 - 17.0 g/dL 08/04/2018  02.7  74.1   Hematocrit 39.0 - 52.0 % 44.5  42.1  38.2   Platelets 150 - 400 K/uL 185  249  251        Latest Ref Rng & Units 11/11/2021   12:40 AM 11/10/2021   12:21 AM 11/09/2021    2:11 AM  CMP  Glucose 70 - 99 mg/dL 01/10/2022  867  672   BUN 6 - 20 mg/dL 20  14  16    Creatinine 0.61 - 1.24 mg/dL 094   7.09   Sodium  135 - 145 mmol/L 137  139  139   Potassium 3.5 - 5.1 mmol/L 4.2  4.2  4.0   Chloride 98 - 111 mmol/L 104  103  103   CO2 22 - 32 mmol/L 23  25  23    Calcium 8.9 - 10.3 mg/dL 8.9  9.1  9.6     Imaging: No results found.  Assessment/Plan:   Principal Problem:   Multiple sclerosis pseudoexacerbation (HCC) Active Problems:   Multiple sclerosis (HCC)   Patient Summary: JAKUB DEBOLD is a 41 y.o. person living with a history of relapsing remitting MS and HTN not currently on disease modifying therapy who presented with acute to subacute onset right sided weakness and dysarthria now admitted for MS exacerbation.   #Relapsing remitting multiple sclerosis w/ likely exacerbation History of relapsing remitting MS no currently on disease modifying therapy. Continues to have right sided weakness with speech difficulty, but improving steadily. Patient underwent MRI brain C-spine T-spine 11/10/21 demonstrating progression of demyelinating disease of brain and spine. He has completed 5 days  of solumedrol therapy, now awaiting CIR vs SNF for continued rehabilitation. Patient is medically stable for discharge. -appreciate neurology recommendations -completed course of IV methylprednisolone  -outpatient f/u with Dr. Epimenio Foot in 1 month. Will call to schedule on Monday -PT recommends inpatient rehab. CIR currently working on dispo. -f/u with [facility] 1-2 weeks after discharge to establish care  #Asymptomatic Bradycardia Hemodynamically stable. TSH wnl. -Patient remains asymptomatic.    #HTN Normotensive today. Not on any home medications for this.   Diet: NPO VTE:  lovenox IVF: None,None Code: Full   Prior to Admission Living Arrangement: Home, living with 54 year old daughter and 81 year old son Anticipated Discharge Location: CIR vs SNF vs home? Barriers to Discharge: awaiting placement  Merrilyn Puma, MD Internal Medicine Resident PGY-3 Please contact the on call pager after 5 pm and on weekends  at [phone].

## 2021-11-14 ENCOUNTER — Encounter (HOSPITAL_COMMUNITY): Payer: Self-pay | Admitting: Radiology

## 2021-11-14 DIAGNOSIS — G35 Multiple sclerosis: Secondary | ICD-10-CM | POA: Diagnosis not present

## 2021-11-14 MED ORDER — POLYETHYLENE GLYCOL 3350 17 G PO PACK
17.0000 g | PACK | Freq: Every day | ORAL | Status: DC
  Administered 2021-11-14 – 2021-11-16 (×3): 17 g via ORAL
  Filled 2021-11-14 (×3): qty 1

## 2021-11-14 MED ORDER — SENNOSIDES-DOCUSATE SODIUM 8.6-50 MG PO TABS
1.0000 | ORAL_TABLET | Freq: Every day | ORAL | Status: DC
  Administered 2021-11-14: 1 via ORAL
  Filled 2021-11-14 (×2): qty 1

## 2021-11-14 NOTE — Progress Notes (Addendum)
Physical Therapy Treatment Patient Details Name: [name] MRN: [id number] DOB: 1980/12/15 Today's Date: 11/14/2021   History of Present Illness 41 yo male presented with BIL LE weakness, speech difficulty, and urinary incontinence. His symptoms became noticeable 1.5 weeks ago and began with weakness in his right lower extremity with difficulty walking. He states he's fallen 14 to 15 times in the past 2 weeks due to imbalance. PMH PMHx of relapsing remitting MS and HTN    PT Comments    Pt instructed in and performed therex and gait training. Pt with more significant weakness in R LE vs L LE. Pt with severe imbalance during gait trials due to fatigue and allowing RW to travel way out in front of him requiring therapist to assist him to chair. Pt continues to report having no energy (rest breaks provided). Pt will require significant level of assistance following acute care stay and SNF is strongly recommended due to his continued weakness, reduced safety awareness, and high risk for falls. Continue as tolerated.   Recommendations for follow up therapy are one component of a multi-disciplinary discharge planning process, led by the attending physician.  Recommendations may be updated based on patient status, additional functional criteria and insurance authorization.  Follow Up Recommendations  Skilled nursing-short term rehab (<3 hours/day) Can patient physically be transported by private vehicle: Yes   Assistance Recommended at Discharge Frequent or constant Supervision/Assistance  Patient can return home with the following A lot of help with walking and/or transfers;Help with stairs or ramp for entrance;Assist for transportation;Assistance with cooking/housework;A lot of help with bathing/dressing/bathroom;Direct supervision/assist for medications management;Direct supervision/assist for financial management   Equipment Recommendations  Wheelchair (measurements PT);Wheelchair cushion  (measurements PT)    Recommendations for Other Services       Precautions / Restrictions Precautions Precautions: Fall Precaution Comments: condom foley Restrictions Weight Bearing Restrictions: No     Mobility  Bed Mobility Overal bed mobility: Needs Assistance Bed Mobility: Sit to Supine       Sit to supine: Mod assist, +2 for physical assistance   General bed mobility comments: Pt up in chair upon arrival but requested to get back to bed after session complete.    Transfers Overall transfer level: Needs assistance Equipment used: Rolling walker (2 wheels) Transfers: Sit to/from Stand, Bed to chair/wheelchair/BSC Sit to Stand: Min assist, Min guard Stand pivot transfers: Mod assist (without AD)         General transfer comment: Pt performed 7 sit <> stands during this encounter (4 consecutive from chair prior to gait, two for gait, and one for stand pivot back to bed after gait). Pt required cues for sequencing and technique including having feet in proper position pre-stand. Pt with bilateral knee valgus during each attempt with unsteadiness present.    Ambulation/Gait Ambulation/Gait assistance: Mod assist, +2 safety/equipment (chair followed pt) Gait Distance (Feet): 10 Feet Assistive device: Rolling walker (2 wheels) Gait Pattern/deviations: Knee flexed in stance - left, Knee flexed in stance - right, Ataxic, Drifts right/left, Trunk flexed       General Gait Details: Pt ambulated 6 feet and 5 feet respectively with rest break between trials. Pt with ataxia and poor endurance. Chair followed pt. Pt with severe forward trunk flexion and letting RW get way far out in front of him as he fatigued at end of each distance despite cues for positioning prior to and during each attempt. Pt had to be assisted to chair via moderate assistance each time due to  high chance of falling.   Stairs             Wheelchair Mobility    Modified Rankin (Stroke Patients  Only)       Balance Overall balance assessment: Needs assistance Sitting-balance support: Bilateral upper extremity supported Sitting balance-Leahy Scale: Poor     Standing balance support: Bilateral upper extremity supported, Reliant on assistive device for balance Standing balance-Leahy Scale: Poor                              Cognition Arousal/Alertness: Awake/alert Behavior During Therapy: Flat affect Overall Cognitive Status: Difficult to assess (cognition impaired but unsure of pt's baseline)                                 General Comments: Pt A x O but with decreased ability to follow mobility commands and lack of safety awareness present.        Exercises General Exercises - Lower Extremity Short Arc Quad: Both, Supine (5 reps on R and 8 reps on L) Hip ABduction/ADduction: Supine, Both, 10 reps, Other (comment) (pt performed hip abd/clamshells with gait belt and hip adduction pillow squeezes) Straight Leg Raises: Supine, Both (able on L for a few reps but unable on R)    General Comments        Pertinent Vitals/Pain Pain Assessment Pain Assessment: 0-10 Pain Score: 0-No pain (pt reported that he would have pain if to stand) Pain Intervention(s): Limited activity within patient's tolerance, Monitored during session    Home Living                          Prior Function            PT Goals (current goals can now be found in the care plan section) Acute Rehab PT Goals PT Goal Formulation: With patient Time For Goal Achievement: 11/15/21 Potential to Achieve Goals: Fair Progress towards PT goals: Progressing toward goals    Frequency    Min 5X/week      PT Plan Discharge plan needs to be updated    Co-evaluation              AM-PAC PT "6 Clicks" Mobility   Outcome Measure  Help needed turning from your back to your side while in a flat bed without using bedrails?: A Little Help needed moving from  lying on your back to sitting on the side of a flat bed without using bedrails?: A Little Help needed moving to and from a bed to a chair (including a wheelchair)?: A Lot Help needed standing up from a chair using your arms (e.g., wheelchair or bedside chair)?: A Little Help needed to walk in hospital room?: Total Help needed climbing 3-5 steps with a railing? : Total 6 Click Score: 13    End of Session Equipment Utilized During Treatment: Gait belt Activity Tolerance: Patient limited by fatigue Patient left: in bed;with call bell/phone within reach;Other (comment) (bed alarm not setting and nurse notified) Nurse Communication: Mobility status;Precautions PT Visit Diagnosis: Other abnormalities of gait and mobility (R26.89);Repeated falls (R29.6);Muscle weakness (generalized) (M62.81)     Time: 1696-7893 PT Time Calculation (min) (ACUTE ONLY): 30 min  Charges:  $Gait Training: 8-22 mins $Therapeutic Exercise: 8-22 mins  Donna Bernard, PT    Donna Bernard 11/14/2021, 4:02 PM

## 2021-11-14 NOTE — Progress Notes (Signed)
Inpatient Rehab Admissions Coordinator:  Reviewed pt with Dr. Wynn Banker. He feels another rehab venue would be more appropriate for pt given intermittent supervision and h/o falls. TOC made aware.   Wolfgang Phoenix, MS, CCC-SLP Admissions Coordinator [phone]

## 2021-11-14 NOTE — TOC Initial Note (Signed)
Transition of Care [facility]) - Initial/Assessment Note    Patient Details  Name: [name] MRN: [id number] Date of Birth: 24-Nov-1980  Transition of Care [facility]) CM/SW Contact:    Tresa Endo Phone Number: 11/14/2021, 4:36 PM  Clinical Narrative:                 CSW received SNF consult. CSW met with pt at bedside. CSW introduced self and explained role at the hospital. Pt reports that PTA the pt lived at home alone and on weekends his 21 and 70 year old children stays with him. PT reports pt is weak and needs to continue using a RW for ambulation. Pt walked 22f while working with PT and and requires min/modA overall.  CSW reviewed PT/OT recommendations for SNF. Pt reports understanding the need for SNF and would like to be somewhere close to home. Pt gave CSW permission to fax out to facilities in the area. Pt has no preference of facility at this time.   CSW will continue to follow.    Expected Discharge Plan: Skilled Nursing Facility Barriers to Discharge: Continued Medical Work up   Patient Goals and CMS Choice Patient states their goals for this hospitalization and ongoing recovery are:: Rehab CMS Medicare.gov Compare Post Acute Care list provided to:: Patient Choice offered to / list presented to : Patient  Expected Discharge Plan and Services Ex[place] Discharge Plan: [place] Referral: Clinical Social Work   [place] the [place]                                     Prior Living Arrangements/Services Living arrangements for the past 2 months: Single Family Home Lives with:: Self Patient language and need for interpreter reviewed:: Yes Do you feel safe going back to the place where you live?: Yes      Need for Family Participation in Patient Care: Yes (Comment) Care giver support system in place?: Yes (comment)   Criminal Activity/Legal  Involvement Pertinent to Current Situation/Hospitalization: No - Comment as needed  Activities of Daily Living Home Assistive Devices/Equipment: Eyeglasses ADL Screening (condition at time of admission) Patient's cognitive ability adequate to safely complete daily activities?: Yes Is the patient deaf or have difficulty hearing?: No Does the patient have difficulty seeing, even when wearing glasses/contacts?: No Does the patient have difficulty concentrating, remembering, or making decisions?: Yes Patient able to express need for assistance with ADLs?: Yes Does the patient have difficulty dressing or bathing?: Yes Independently performs ADLs?: No Communication: Independent Dressing (OT): Independent Grooming: Independent Feeding: Independent Bathing: Independent Toileting: Independent In/Out Bed: Independent Walks in Home: Independent Does the patient have difficulty walking or climbing stairs?: No Weakness of Legs: Both Weakness of Arms/Hands: None  Permission Sought/Granted Permission sought to share information with : Facility CSport and exercise psychologist Family Supports Permission granted to share information with : Yes, Verbal Permission Granted  Share Information with NAME: MMedard, Decuir(Daughter)   [phone] Permission granted to share info w AGENCY: SNF  Permission granted to share info w Relationship: MBaldomero, Mirarchi(Daughter)   [phone] Permission granted to share info w Contact Information: MJustino, Boze(Daughter)   [phone] Emotional Assessment Appearance:: Appears stated age Attitude/Demeanor/Rapport: Gracious Affect (typically observed): Accepting Orientation: : Oriented to Self, Oriented to Place, Oriented to  Time, Oriented to Situation Alcohol /  Substance Use: Not Applicable Psych Involvement: No (comment)  Admission diagnosis:  Multiple sclerosis ([place]) [G35] Multiple sclerosis exacerbation ([place]) [G35] Patient Active Problem List   Diagnosis Date  Noted   Cognitive and behavioral changes    Muscle spasm 11/17/2017   Homicidal ideation 11/17/2017   Dysesthesia    Muscle spasms of neck    Neuropathic pain    Vitamin B12 deficiency    Hypoalbuminemia    Urinary frequency    Tobacco abuse    Steroid-induced hyperglycemia    Marijuana abuse, continuous 04/12/2017   Vitamin D deficiency 04/12/2017   Multiple sclerosis pseudoexacerbation ([place])    Abnormality of gait 05/10/2016   Weakness    Hypertension    Depression    Multiple sclerosis ([place]) 03/02/2011   PCP:  Cyndi Bender, PA-C Pharmacy:   [id number] [id number]: [phone]: [phone]    Social Determinants of Health (SDOH) Interventions    Readmission Risk Interventions     No data to display

## 2021-11-14 NOTE — NC FL2 (Signed)
[place] MEDICAID FL2 LEVEL OF CARE SCREENING TOOL     IDENTIFICATION  Patient Name: [name] Birthdate: Nov 24, 1980 Sex: male Admission Date (Current Location): 11/07/2021  [facility] and [place] Number:  Producer, television/film/video and Address:  [facility] [place], [address], [place], [place] [address]      Provider Number: [id number]  Attending Physician Name and Address:  Reymundo Poll, MD  Relative Name and Phone Number:  Loyal, Holzheimer (Daughter)   [phone]    Current Level of Care: Hospital Recommended Level of Care: Skilled Nursing Facility Prior Approval Number:    Date Approved/Denied:   PASRR Number: [id number]  Discharge Plan: SNF    Current Diagnoses: Patient Active Problem List   Diagnosis Date Noted   Cognitive and behavioral changes    Muscle spasm 11/17/2017   Homicidal ideation 11/17/2017   Dysesthesia    Muscle spasms of neck    Neuropathic pain    Vitamin B12 deficiency    Hypoalbuminemia    Urinary frequency    Tobacco abuse    Steroid-induced hyperglycemia    Marijuana abuse, continuous 04/12/2017   Vitamin D deficiency 04/12/2017   Multiple sclerosis pseudoexacerbation ([facility])    Abnormality of gait 05/10/2016   Weakness    Hypertension    Depression    Multiple sclerosis ([facility]) 03/02/2011    Orientation RESPIRATION BLADDER Height & Weight     Self, Time, Situation, Place  Normal Incontinent, External catheter Weight: 172 lb 6.4 oz (78.2 kg) Height:  5\' 10"  (177.8 cm)  BEHAVIORAL SYMPTOMS/MOOD NEUROLOGICAL BOWEL NUTRITION STATUS      Incontinent Diet (See DC Summary)  AMBULATORY STATUS COMMUNICATION OF NEEDS Skin   Extensive Assist Verbally Normal                       Personal Care Assistance Level of Assistance  Bathing, Feeding, Dressing Bathing Assistance: Maximum assistance Feeding assistance: Independent Dressing Assistance: Maximum assistance     Functional Limitations Info  Speech, Sight,  Hearing Sight Info: Impaired Hearing Info: Adequate Speech Info: Adequate    SPECIAL CARE FACTORS FREQUENCY  PT (By licensed PT), OT (By licensed OT)     PT Frequency: 5x a week OT Frequency: 5x a week            Contractures Contractures Info: Not present    Additional Factors Info  Allergies, Code Status Code Status Info: Full Allergies Info: NKA           Current Medications (11/14/2021):  This is the current hospital active medication list Current Facility-Administered Medications  Medication Dose Route Frequency Provider Last Rate Last Admin   enoxaparin (LOVENOX) injection 40 mg  40 mg Subcutaneous Q24H 11/16/2021, MD   40 mg at 11/13/21 1635   LORazepam (ATIVAN) injection 2 mg  2 mg Intravenous Once PRN 11/15/21, MD       nicotine (NICODERM CQ - dosed in mg/24 hours) patch 14 mg  14 mg Transdermal Daily Merrilyn Puma, MD   14 mg at 11/14/21 1010   polyethylene glycol (MIRALAX / GLYCOLAX) packet 17 g  17 g Oral Daily 11/16/21, MD   17 g at 11/14/21 1010   senna-docusate (Senokot-S) tablet 1 tablet  1 tablet Oral QHS 11/16/21, MD         Discharge Medications: Please see discharge summary for a list of discharge medications.  Relevant Imaging Results:  Relevant Lab Results:   Additional  Information SSN: [id number]  Ivette Loyal, LCSWA

## 2021-11-14 NOTE — Plan of Care (Signed)
  Problem: Nutrition: Goal: Adequate nutrition will be maintained Outcome: Progressing   Problem: Pain Managment: Goal: General experience of comfort will improve Outcome: Progressing   Problem: Safety: Goal: Ability to remain free from injury will improve Outcome: Progressing   

## 2021-11-14 NOTE — Progress Notes (Signed)
Subjective:   Summary: [name] is a 41 yo male with PMHx of relapsing remitting MS and HTN who presented with lower extremity weakness, speech difficulty, and urinary incontinence.  Overnight Events: NAEO.  Patient feels he is doing as well as can be expected. He feels like his speech has improved. He is eating with daughter and son when seen on rounds today. He is still amenable to CIR for further rehabilitation.  Objective:  Vital signs in last 24 hours: Vitals:   11/13/21 1620 11/13/21 1952 11/14/21 0628 11/14/21 0804  BP: 127/87 123/84 129/77 133/82  Pulse: 72 69 60 75  Resp: 16 17 17 16   Temp: 98.2 F (36.8 C) 98.7 F (37.1 C) 97.6 F (36.4 C) 98.2 F (36.8 C)  TempSrc: Oral Oral  Oral  SpO2: 97% 100% 100% 100%  Weight:      Height:       Supplemental O2: Room Air SpO2: 100 %   Physical Exam:  General: well-appearing male, sitting in bed, NAD. CV: normal rate and regular rhythm. Pulm: CTABL, normal WOB on RA. Neuro: 4/5 RUE strength, 4/5 RLE strength. Speech difficulty moderately improved since admission. Skin: warm and dry.  Filed Weights   11/11/21 1052  Weight: 78.2 kg     Intake/Output Summary (Last 24 hours) at 11/14/2021 1338 Last data filed at 11/14/2021 1300 Gross per 24 hour  Intake 360 ml  Output 300 ml  Net 60 ml   Net IO Since Admission: -474 mL [11/14/21 1338]  Pertinent Labs:    Latest Ref Rng & Units 11/08/2021    6:08 AM 11/07/2021    7:30 AM 06/05/2018    7:02 AM  CBC  WBC 4.0 - 10.5 K/uL 4.4  4.3  4.4   Hemoglobin 13.0 - 17.0 g/dL 08/04/2018  36.6  44.0   Hematocrit 39.0 - 52.0 % 44.5  42.1  38.2   Platelets 150 - 400 K/uL 185  249  251        Latest Ref Rng & Units 11/11/2021   12:40 AM 11/10/2021   12:21 AM 11/09/2021    2:11 AM  CMP  Glucose 70 - 99 mg/dL 01/10/2022  425  956   BUN 6 - 20 mg/dL 20  14  16    Creatinine 0.61 - 1.24 mg/dL 387   5.64   Sodium 135 - 145 mmol/L 137  139  139   Potassium 3.5 - 5.1 mmol/L 4.2  4.2   4.0   Chloride 98 - 111 mmol/L 104  103  103   CO2 22 - 32 mmol/L 23  25  23    Calcium 8.9 - 10.3 mg/dL 8.9  9.1  9.6     Imaging: No results found.  Assessment/Plan:   Principal Problem:   Multiple sclerosis pseudoexacerbation ([facility]) Active Problems:   Multiple sclerosis ([facility])   Patient Summary: [name] is a 41 y.o. person living with a history of relapsing remitting MS and HTN not currently on disease modifying therapy who presented with acute to subacute onset right sided weakness and dysarthria now admitted for MS exacerbation.   #Relapsing remitting multiple sclerosis w/ likely exacerbation History of relapsing remitting MS no currently on disease modifying therapy. Continues to have right sided weakness with speech difficulty, but improving steadily. Patient underwent MRI brain C-spine T-spine 11/10/21 demonstrating progression of demyelinating disease of brain and spine. He has completed 5 days of solumedrol therapy, now awaiting SNF placement for continued  rehabilitation because CIR feels another rehab venue more appropriate given intermittent supervision at home and h/o falls. Patient is medically stable for discharge.  -Patient dismissed from [facility]. Will schedule follow up with another local MS specialist if possible. -appreciate neurology recommendations -completed course of IV methylprednisolone  -f/u with [facility] 1-2 weeks after discharge to establish care  #Asymptomatic Bradycardia Hemodynamically stable. TSH wnl. -Patient remains asymptomatic.    #HTN Mildly hypertensive today. Not on any home medications for this. -F/u outpatient for potential medication for essential hypertension   Diet: NPO VTE:  lovenox IVF: None,None Code: Full   Prior to Admission Living Arrangement: Home, living with 15 year old daughter and 73 year old son Anticipated Discharge Location: CIR vs SNF vs home? Barriers to Discharge: awaiting  placement  Adron Bene, MD Internal Medicine Resident PGY-1 Please contact the on call pager after 5 pm and on weekends at [phone].

## 2021-11-15 DIAGNOSIS — G35 Multiple sclerosis: Secondary | ICD-10-CM | POA: Diagnosis not present

## 2021-11-15 NOTE — TOC Progression Note (Addendum)
Transition of Care [facility]) - Progression Note    Patient Details  Name: [name] MRN: [id number] Date of Birth: Jan 26, 1981  [facility] CM/SW Contact  Ivette Loyal, [place] Phone Number: 11/15/2021, 3:32 PM  Clinical Narrative:    CSW spoke with pt about only having [facility]HC offered, pt is agreeable CSW started [place]. CSW will continue to follow for DC needs.  Pt auth approved ref# [id number], pt will DC to [facility] tomorrow morning.    Expected Discharge Plan: Skilled Nursing Facility Barriers to Discharge: Continued Medical Work up  Expected Discharge Plan and Services Expected Discharge Plan: Skilled Nursing Facility In-house Referral: Clinical Social Work   Post Acute Care Choice: Skilled Nursing Facility Living arrangements for the past 2 months: Single Family Home                                       Social Determinants of Health (SDOH) Interventions    Readmission Risk Interventions     No data to display

## 2021-11-15 NOTE — Progress Notes (Signed)
Physical Therapy Treatment Patient Details Name: [name] MRN: [id number] DOB: 11-12-1980 Today's Date: 11/15/2021   History of Present Illness 40 yo male presented with BIL LE weakness, speech difficulty, and urinary incontinence. His symptoms became noticeable 1.5 weeks ago and began with weakness in his right lower extremity with difficulty walking. He states he's fallen 14 to 15 times in the past 2 weeks due to imbalance. PMH PMHx of relapsing remitting MS and HTN    PT Comments    Pt agreeable to physical therapy session and continues to report low energy levels (pt states all times of day are about the same). Pt unable to advance with gait training due to fatigue and poor balance. Pt's functional prognosis unpredictable at this time.  Recommendations for follow up therapy are one component of a multi-disciplinary discharge planning process, led by the attending physician.  Recommendations may be updated based on patient status, additional functional criteria and insurance authorization.  Follow Up Recommendations  Skilled nursing-short term rehab (<3 hours/day) Can patient physically be transported by private vehicle: Yes   Assistance Recommended at Discharge Frequent or constant Supervision/Assistance  Patient can return home with the following A lot of help with walking and/or transfers;Help with stairs or ramp for entrance;Assist for transportation;Assistance with cooking/housework;A lot of help with bathing/dressing/bathroom;Direct supervision/assist for medications management;Direct supervision/assist for financial management   Equipment Recommendations  Wheelchair (measurements PT);Wheelchair cushion (measurements PT)    Recommendations for Other Services       Precautions / Restrictions Precautions Precautions: Fall Precaution Comments: condom foley Restrictions Weight Bearing Restrictions: No     Mobility  Bed Mobility Overal bed mobility: Needs Assistance Bed  Mobility: Sit to Supine       Sit to supine: Mod assist   General bed mobility comments: Pt requires moderate assistance for LE's when getting back in bed.    Transfers Overall transfer level: Needs assistance Equipment used: Rolling walker (2 wheels) Transfers: Sit to/from Stand, Bed to chair/wheelchair/BSC Sit to Stand: Min guard, Min assist Stand pivot transfers: Min guard         General transfer comment: Pt performed pre-gait side steps at EOB with RW prior to gait and stand pivot transfer with RW following gait. Pt required CGA during transfers with exception of min assist to control descent during stand to sits to chair during gait.    Ambulation/Gait Ambulation/Gait assistance: Mod assist, +2 safety/equipment (chair followed pt) Gait Distance (Feet): 8 Feet Assistive device: Rolling walker (2 wheels) Gait Pattern/deviations: Knee flexed in stance - left, Knee flexed in stance - right, Ataxic, Drifts right/left, Trunk flexed       General Gait Details: Pt ambulated 5 feet and 3 feet respectively with rest break between trials. Pt continues to demonstrate poor step coordination, instability, and limited endurance. Pt still allowing RW to get too far in front of him as he fatigues but therapist intervened when he began to do so to safely assist him to chair.   Stairs             Wheelchair Mobility    Modified Rankin (Stroke Patients Only)       Balance Overall balance assessment: Needs assistance Sitting-balance support: Bilateral upper extremity supported Sitting balance-Leahy Scale: Poor     Standing balance support: Bilateral upper extremity supported, Reliant on assistive device for balance Standing balance-Leahy Scale: Poor  Cognition Arousal/Alertness: Awake/alert Behavior During Therapy: WFL for tasks assessed/performed Overall Cognitive Status: Within Functional Limits for tasks assessed                                  General Comments: Pt plesant and cooperative. Pt following commands correctly although requires increased cues for keeping RW closer to his COG. Pt oriented to date and place.        Exercises      General Comments        Pertinent Vitals/Pain Pain Assessment Pain Assessment: No/denies pain Pain Score: 0-No pain    Home Living                          Prior Function            PT Goals (current goals can now be found in the care plan section) Acute Rehab PT Goals PT Goal Formulation: With patient Time For Goal Achievement: 11/15/21 Potential to Achieve Goals: Fair Progress towards PT goals: Progressing toward goals (slow progress)    Frequency    Min 5X/week      PT Plan Current plan remains appropriate    Co-evaluation              AM-PAC PT "6 Clicks" Mobility   Outcome Measure  Help needed turning from your back to your side while in a flat bed without using bedrails?: A Little Help needed moving from lying on your back to sitting on the side of a flat bed without using bedrails?: A Little Help needed moving to and from a bed to a chair (including a wheelchair)?: A Lot Help needed standing up from a chair using your arms (e.g., wheelchair or bedside chair)?: A Little Help needed to walk in hospital room?: Total Help needed climbing 3-5 steps with a railing? : Total 6 Click Score: 13    End of Session Equipment Utilized During Treatment: Gait belt Activity Tolerance: Patient limited by fatigue Patient left: in bed;with call bell/phone within reach;Other (comment) (bed alarm not setting and nurse notified; pt able to teach-back what he needs to do if needing assistance to get OOB) Nurse Communication: Mobility status;Precautions PT Visit Diagnosis: Other abnormalities of gait and mobility (R26.89);Repeated falls (R29.6);Muscle weakness (generalized) (M62.81)     Time: 1430-1450 PT Time Calculation (min)  (ACUTE ONLY): 20 min  Charges:  $Gait Training: 8-22 mins                     Tana Coast, PT    [employer] 11/15/2021, 4:03 PM

## 2021-11-15 NOTE — Discharge Summary (Incomplete)
Name: [name] MRN: [id number] DOB: 08/28/1980 41 y.o. PCP: Henry Peak, PA-C  Date of Admission: 11/07/2021  6:06 AM Date of Discharge: 11/15/2021 Attending Physician: Reymundo Poll, MD  Discharge Diagnosis: 1. Principal Problem:   Multiple sclerosis pseudoexacerbation [facility]) Active Problems:   Multiple sclerosis (HCC)   Discharge Medications: Allergies as of 11/16/2021   No Known Allergies      Medication List     TAKE these medications    acetaminophen 325 MG tablet Commonly known as: TYLENOL Take 1-2 tablets (325-650 mg total) by mouth every 4 (four) hours as needed for mild pain.   pantoprazole 40 MG tablet Commonly known as: PROTONIX Take 1 tablet (40 mg total) by mouth daily.        Disposition and follow-up:   [name] was discharged from [facility] in Stable condition.  At the hospital follow up visit please address:  1.  Multiple Sclerosis exacerbation: Patient presented with exacerbation of his relapsing remitting multiple sclerosis. He received Solumedrol 1g x 5 days. He has not been on disease modifying therapy for at least 2 years. Will need follow up with an MS specialist to determine best next steps. Previously seen by Dr. Malvin Johns in [place]. He has been dismissed from [facility] and [facility].  2.  Labs / imaging needed at time of follow-up: none  3.  Pending labs/ test needing follow-up: none  Follow-up Appointments:  Follow-up Information     Morene Crocker, MD Follow up in 1 month(s).   Specialty: Neurology Contact information: [address] [place] [address] [phone]         [facility]. Go in 1 week(s).   Why: Aug 2 at 2:45 with Dr Molli Hazard information: [address] [place] [address] [phone]                Hospital Course by problem list: 1. MS exacerbation: Patient initially presented with two week history of right  sided weakness, dysarthria, and urinary incontinence. He was initially unable to complete MRI due to inability to stay still in scanner. Started on 5 day course of methylprednisolone 1g. Patient failed 3 attempts at MRI with Ativan before completing under general anesthesia. Findings included progression of his MS with high demyelinating lesion burden in his brain, cervical and thoracic spine. Patient demonstrated moderate improvement with dysarthria and right sided weakness with his steroid course. He is amenable to outpatient follow up with an MS specialist.  Discharge subjective: Patient feels about the same today. He is okay with going to SNF and asks that we speak with his daughter regarding MS specialist f/u.  Discharge Exam:   BP 130/86 (BP Location: Right Arm)   Pulse 89   Temp 98.1 F (36.7 C) (Oral)   Resp 16   Ht 5\' 10"  (1.778 m)   Wt 78.2 kg   SpO2 100%   BMI 24.74 kg/m  Discharge exam:  Physical Exam Constitutional:      General: He is not in acute distress. HENT:     Head: Normocephalic and atraumatic.  Cardiovascular:     Rate and Rhythm: Normal rate and regular rhythm.     Heart sounds: No murmur heard. Pulmonary:     Effort: Pulmonary effort is normal.     Breath sounds: Normal breath sounds.  Musculoskeletal:     Comments: Continued speech difficulty moderately improved from admission, 4/5 strength at right upper  and lower extremities  Skin:    General: Skin is warm and dry.  Neurological:     General: No focal deficit present.     Mental Status: He is alert.  Psychiatric:        Mood and Affect: Mood normal.      Pertinent Labs, Studies, and Procedures:  MR Brain W and Wo Contrast  Result Date: 11/11/2021 CLINICAL DATA:  Multiple sclerosis EXAM: MRI HEAD WITHOUT AND WITH CONTRAST TECHNIQUE: Multiplanar, multiecho pulse sequences of the brain and surrounding structures were obtained without and with intravenous contrast. CONTRAST:  38mL GADAVIST GADOBUTROL  1 MMOL/ML IV SOLN COMPARISON:  January 2020 FINDINGS: Brain: Patchy and confluent areas of T2 hyperintensity in the supratentorial periventricular greater than subcortical Kristine Tiley matter, brainstem, and to a lesser extent thalamus and cerebellum. This has progressed since 2020. Prominence of the ventricles reflects ex vacuo dilatation. There are numerous marked T1 hypointense lesions reflecting "black holes." There is mild enhancement at the deep margin of a left parietotemporal subcortical lesion (series 9, image 32). There is also patchy enhancement in the pericallosal Yishai Rehfeld matter (for example see series 12, image 32). Left centrum semiovale enhancing lesion on series 32, image 15. Faint enhancement seen at the margins of a few other lesions for example adjacent to the right occipital horn on series 32, image 6. No acute infarction or hemorrhage. No intracranial mass or mass effect. No hydrocephalus or extra-axial collection. Vascular: Major vessel flow voids at the skull base are preserved. Skull and upper cervical spine: Normal marrow signal is preserved. Sinuses/Orbits: Minor mucosal thickening.  Orbits are unremarkable. Other: Sella is unremarkable.  Mastoid air cells are clear. IMPRESSION: Progression of severe demyelinating disease since 2020. Few areas of enhancement likely reflecting active demyelination. Associated cavitation and volume loss. Electronically Signed   By: Guadlupe Spanish M.D.   On: 11/11/2021 15:50   MR Cervical Spine W or Wo Contrast  Result Date: 11/11/2021 CLINICAL DATA:  Multiple sclerosis EXAM: MRI CERVICAL AND THORACIC SPINE WITHOUT AND WITH CONTRAST TECHNIQUE: Multiplanar and multiecho pulse sequences of the cervical spine, to include the craniocervical junction and cervicothoracic junction, and the thoracic spine, were obtained without and with intravenous contrast. CONTRAST:  15mL GADAVIST GADOBUTROL 1 MMOL/ML IV SOLN COMPARISON:  Cervical spine 2019, thoracic spine 2018 FINDINGS:  MRI CERVICAL SPINE FINDINGS Alignment: Preserved. Vertebrae: Vertebral body heights are maintained. No marrow edema. No suspicious osseous lesion. Cord: Multiple foci of abnormal cord signal without enhancement. Suspect mild progression although current study as greater sensitivity. Posterior Fossa, vertebral arteries, paraspinal tissues: Retained secretions in the pharynx. Disc levels: Intervertebral disc heights and signal are maintained. No canal or foraminal stenosis at any level. MRI THORACIC SPINE FINDINGS Alignment:  Preserved. Vertebrae: Vertebral body heights are maintained. No marrow edema. No suspicious osseous lesion. Cord: Multiple foci of abnormal cord signal without enhancement. Burden is increased since 2018. Paraspinal and other soft tissues: Unremarkable. Disc levels: Intervertebral disc heights and signal are maintained. There is no canal or foraminal stenosis at any level. IMPRESSION: Progression of demyelinating disease since prior studies of 2018 and 2019. No evidence of active demyelination. Electronically Signed   By: Guadlupe Spanish M.D.   On: 11/11/2021 15:44   MR THORACIC SPINE W WO CONTRAST  Result Date: 11/11/2021 CLINICAL DATA:  Multiple sclerosis EXAM: MRI CERVICAL AND THORACIC SPINE WITHOUT AND WITH CONTRAST TECHNIQUE: Multiplanar and multiecho pulse sequences of the cervical spine, to include the craniocervical junction and cervicothoracic junction, and the  thoracic spine, were obtained without and with intravenous contrast. CONTRAST:  25mL GADAVIST GADOBUTROL 1 MMOL/ML IV SOLN COMPARISON:  Cervical spine 2019, thoracic spine 2018 FINDINGS: MRI CERVICAL SPINE FINDINGS Alignment: Preserved. Vertebrae: Vertebral body heights are maintained. No marrow edema. No suspicious osseous lesion. Cord: Multiple foci of abnormal cord signal without enhancement. Suspect mild progression although current study as greater sensitivity. Posterior Fossa, vertebral arteries, paraspinal tissues:  Retained secretions in the pharynx. Disc levels: Intervertebral disc heights and signal are maintained. No canal or foraminal stenosis at any level. MRI THORACIC SPINE FINDINGS Alignment:  Preserved. Vertebrae: Vertebral body heights are maintained. No marrow edema. No suspicious osseous lesion. Cord: Multiple foci of abnormal cord signal without enhancement. Burden is increased since 2018. Paraspinal and other soft tissues: Unremarkable. Disc levels: Intervertebral disc heights and signal are maintained. There is no canal or foraminal stenosis at any level. IMPRESSION: Progression of demyelinating disease since prior studies of 2018 and 2019. No evidence of active demyelination. Electronically Signed   By: Guadlupe Spanish M.D.   On: 11/11/2021 15:44   DG Chest 1 View  Result Date: 11/07/2021 CLINICAL DATA:  Increasing generalized weakness. EXAM: CHEST  1 VIEW COMPARISON:  11/17/2017. FINDINGS: Trachea is midline. Heart size normal. Lungs are clear. No pleural fluid. IMPRESSION: Negative. Electronically Signed   By: Leanna Battles M.D.   On: 11/07/2021 16:12     Discharge Instructions: Discharge Instructions     Discharge instructions   Complete by: As directed    [name],  It was a pleasure caring for you during your stay here at [facility]. You came to the hospital with right upper and lower extremity weakness, speech difficulty, and urinary incontinence. Imaging confirmed a flare of your multiple sclerosis. Your were treated with steroids with improvement of your symptoms. -Please follow up with our Internal medicine clinic on August 2 at 2:45pm with Dr. August Saucer.  -Please contact your neurologist, Dr. Malvin Johns, at the number provided to schedule a follow up in 1 month     Signed: Adron Bene, MD 11/16/2021, 1:03 PM   Pager: [phone]

## 2021-11-15 NOTE — Progress Notes (Signed)
Subjective:   Summary: Mr. Dashner is a 41 yo male with PMHx of relapsing remitting MS and HTN who presented with lower extremity weakness, speech difficulty, and urinary incontinence.  Overnight Events: NAEO.   Patient states he feels the same today. We discussed outpatient follow up with an MS specialist. He was unaware he had been dismissed from [facility] and was frustrated by the news. He is amenable to an outpatient f/u appointment to begin treatment for his MS.    Objective:  Vital signs in last 24 hours: Vitals:   11/14/21 0804 11/14/21 1701 11/14/21 2128 11/15/21 0759  BP: 133/82 135/84 121/78 132/85  Pulse: 75 81 78 82  Resp: 16 16 16 16   Temp: 98.2 F (36.8 C) 98.3 F (36.8 C) (!) 97.5 F (36.4 C) 97.8 F (36.6 C)  TempSrc: Oral Oral Oral Oral  SpO2: 100% 98% 98% 100%  Weight:      Height:       Supplemental O2: Room Air SpO2: 100 %   Physical Exam:  General: well-appearing male, sitting in bed, NAD. HEENT: EOMI, normocephalic, atraumatic.  CV: normal rate and regular rhythm. Skin: warm and dry. Psych: Normal mood and affect.  Filed Weights   11/11/21 1052  Weight: 78.2 kg     Intake/Output Summary (Last 24 hours) at 11/15/2021 1034 Last data filed at 11/15/2021 0900 Gross per 24 hour  Intake 240 ml  Output 350 ml  Net -110 ml   Net IO Since Admission: -284 mL [11/15/21 1034]  Pertinent Labs:    Latest Ref Rng & Units 11/08/2021    6:08 AM 11/07/2021    7:30 AM 06/05/2018    7:02 AM  CBC  WBC 4.0 - 10.5 K/uL 4.4  4.3  4.4   Hemoglobin 13.0 - 17.0 g/dL 08/04/2018  40.9  81.1   Hematocrit 39.0 - 52.0 % 44.5  42.1  38.2   Platelets 150 - 400 K/uL 185  249  251        Latest Ref Rng & Units 11/11/2021   12:40 AM 11/10/2021   12:21 AM 11/09/2021    2:11 AM  CMP  Glucose 70 - 99 mg/dL 01/10/2022  782  956   BUN 6 - 20 mg/dL 20  14  16    Creatinine 0.61 - 1.24 mg/dL 213   0.86   Sodium 135 - 145 mmol/L 137  139  139    Potassium 3.5 - 5.1 mmol/L 4.2  4.2  4.0   Chloride 98 - 111 mmol/L 104  103  103   CO2 22 - 32 mmol/L 23  25  23    Calcium 8.9 - 10.3 mg/dL 8.9  9.1  9.6     Imaging: No results found.  Assessment/Plan:   Principal Problem:   Multiple sclerosis pseudoexacerbation (HCC) Active Problems:   Multiple sclerosis (HCC)   Patient Summary: JOSAIAH MUHAMMED is a 41 y.o. person living with a history of relapsing remitting MS and HTN not currently on disease modifying therapy who presented with acute to subacute onset right sided weakness and dysarthria now admitted for MS exacerbation.   #Relapsing remitting multiple sclerosis w/ likely exacerbation History of relapsing remitting MS no currently on disease modifying therapy. Continues to have right sided weakness with speech difficulty, but improving steadily. Patient underwent MRI brain C-spine T-spine 11/10/21 demonstrating progression of demyelinating disease of brain and spine. He has completed 5 days of solumedrol therapy, now awaiting SNF  placement. Patient is medically stable for discharge.  -f/u in one month with MS specialist -f/u with [facility] 1-2 weeks after discharge to establish care -completed course of IV methylprednisolone   #Asymptomatic Bradycardia Hemodynamically stable. TSH wnl. -Patient remains asymptomatic.    #HTN Mildly hypertensive during admission. Not on any home medications for this. -F/u outpatient for potential medication for essential hypertension   Diet: NPO VTE:  lovenox IVF: None,None Code: Full   Prior to Admission Living Arrangement: Home, living with 13 year old daughter and 71 year old son Anticipated Discharge Location: CIR vs SNF vs home? Barriers to Discharge: awaiting placement  Adron Bene, MD Internal Medicine Resident PGY-1 Please contact the on call pager after 5 pm and on weekends at [phone].

## 2021-11-16 DIAGNOSIS — E559 Vitamin D deficiency, unspecified: Secondary | ICD-10-CM | POA: Diagnosis not present

## 2021-11-16 DIAGNOSIS — R531 Weakness: Secondary | ICD-10-CM | POA: Diagnosis not present

## 2021-11-16 DIAGNOSIS — F339 Major depressive disorder, recurrent, unspecified: Secondary | ICD-10-CM | POA: Diagnosis not present

## 2021-11-16 DIAGNOSIS — M792 Neuralgia and neuritis, unspecified: Secondary | ICD-10-CM | POA: Diagnosis not present

## 2021-11-16 DIAGNOSIS — Z7401 Bed confinement status: Secondary | ICD-10-CM | POA: Diagnosis not present

## 2021-11-16 DIAGNOSIS — R262 Difficulty in walking, not elsewhere classified: Secondary | ICD-10-CM | POA: Diagnosis not present

## 2021-11-16 DIAGNOSIS — M25562 Pain in left knee: Secondary | ICD-10-CM | POA: Diagnosis not present

## 2021-11-16 DIAGNOSIS — R5383 Other fatigue: Secondary | ICD-10-CM | POA: Diagnosis not present

## 2021-11-16 DIAGNOSIS — R41841 Cognitive communication deficit: Secondary | ICD-10-CM | POA: Diagnosis not present

## 2021-11-16 DIAGNOSIS — Z720 Tobacco use: Secondary | ICD-10-CM | POA: Diagnosis not present

## 2021-11-16 DIAGNOSIS — R4781 Slurred speech: Secondary | ICD-10-CM | POA: Diagnosis not present

## 2021-11-16 DIAGNOSIS — I10 Essential (primary) hypertension: Secondary | ICD-10-CM | POA: Diagnosis not present

## 2021-11-16 DIAGNOSIS — G35 Multiple sclerosis: Secondary | ICD-10-CM | POA: Diagnosis not present

## 2021-11-16 DIAGNOSIS — K219 Gastro-esophageal reflux disease without esophagitis: Secondary | ICD-10-CM | POA: Diagnosis not present

## 2021-11-16 DIAGNOSIS — M25561 Pain in right knee: Secondary | ICD-10-CM | POA: Diagnosis not present

## 2021-11-16 DIAGNOSIS — M6281 Muscle weakness (generalized): Secondary | ICD-10-CM | POA: Diagnosis not present

## 2021-11-16 DIAGNOSIS — R471 Dysarthria and anarthria: Secondary | ICD-10-CM | POA: Diagnosis not present

## 2021-11-16 DIAGNOSIS — F1210 Cannabis abuse, uncomplicated: Secondary | ICD-10-CM | POA: Diagnosis not present

## 2021-11-16 DIAGNOSIS — E119 Type 2 diabetes mellitus without complications: Secondary | ICD-10-CM | POA: Diagnosis not present

## 2021-11-16 DIAGNOSIS — R001 Bradycardia, unspecified: Secondary | ICD-10-CM | POA: Diagnosis not present

## 2021-11-16 DIAGNOSIS — M62838 Other muscle spasm: Secondary | ICD-10-CM | POA: Diagnosis not present

## 2021-11-16 DIAGNOSIS — I69822 Dysarthria following other cerebrovascular disease: Secondary | ICD-10-CM | POA: Diagnosis not present

## 2021-11-16 DIAGNOSIS — R2681 Unsteadiness on feet: Secondary | ICD-10-CM | POA: Diagnosis not present

## 2021-11-16 NOTE — Progress Notes (Signed)
Physical Therapy Treatment Patient Details Name: [name] MRN: [id number] DOB: [birth year] Today's Date: 11/16/2021   History of Present Illness 41 yo male presented with BIL LE weakness, speech difficulty, and urinary incontinence. His symptoms became noticeable 1.5 weeks ago and began with weakness in his right lower extremity with difficulty walking. He states he's fallen 14 to 15 times in the past 2 weeks due to imbalance. PMH PMHx of relapsing remitting MS and HTN    PT Comments    Pt admitted with above diagnosis. Pt met 1/5 goals.  Slow progress due to limited endurance and weakness all 4 extremities.  Goals revised. Pt needs continued PT to incr strenght and endurance for activity.  Pt currently with functional limitations due to balance and enduraance deficits. Pt will benefit from skilled PT to increase their independence and safety with mobility to allow discharge to the venue listed below.      Recommendations for follow up therapy are one component of a multi-disciplinary discharge planning process, led by the attending physician.  Recommendations may be updated based on patient status, additional functional criteria and insurance authorization.  Follow Up Recommendations  Skilled nursing-short term rehab (<3 hours/day) Can patient physically be transported by private vehicle: Yes   Assistance Recommended at Discharge Frequent or constant Supervision/Assistance  Patient can return home with the following A lot of help with walking and/or transfers;Help with stairs or ramp for entrance;Assist for transportation;Assistance with cooking/housework;A lot of help with bathing/dressing/bathroom;Direct supervision/assist for medications management;Direct supervision/assist for financial management   Equipment Recommendations  Wheelchair (measurements PT);Wheelchair cushion (measurements PT)    Recommendations for Other Services       Precautions / Restrictions  Precautions Precautions: Fall Precaution Comments: condom foley- dc in the bed prior to bed mobility session RN / NT notified Restrictions Weight Bearing Restrictions: No     Mobility  Bed Mobility               General bed mobility comments: in chair onn arrival    Transfers Overall transfer level: Needs assistance Equipment used:  (stand up Rollator) Transfers: Sit to/from Stand Sit to Stand: Min assist, +2 safety/equipment           General transfer comment: Pt needs cues for hand placement and a little assist to power up to the RW.    Ambulation/Gait Ambulation/Gait assistance: Mod assist, +2 safety/equipment (chair followed pt) Gait Distance (Feet): 60 Feet (15 feet, 15 feet, 20 feet 20 feet) Assistive device: Standup Rollator Gait Pattern/deviations: Knee flexed in stance - left, Knee flexed in stance - right, Ataxic, Drifts right/left, Trunk flexed, Decreased dorsiflexion - right   Gait velocity interpretation: <1.31 ft/sec, indicative of household ambulator   General Gait Details: Pt ambulated several short distances with close chair follow with rest break between trials. Pt continues to demonstrate poor step coordination, instability, and limited endurance. Pt did do better with the stand up rollator with pt able to progress distance given the incrUE support on the elbow armrests.  Pt needs mod cues and assist for right LE movement as he tends to drag the right LE or circumduct it with movement.   Stairs             Wheelchair Mobility    Modified Rankin (Stroke Patients Only)       Balance           Standing balance support: Bilateral upper extremity supported, During functional activity, Reliant on assistive device for balance  Standing balance-Leahy Scale: Poor Standing balance comment: Pt relies on external support as well as on device                            Cognition Arousal/Alertness: Awake/alert Behavior During  Therapy: WFL for tasks assessed/performed Overall Cognitive Status: Impaired/Different from baseline Area of Impairment: Safety/judgement                         Safety/Judgement: Decreased awareness of safety     General Comments: pt is impulsive and needs cues to remain seated.        Exercises General Exercises - Lower Extremity Ankle Circles/Pumps: Both, 10 reps, Supine, AROM Quad Sets: Both, 10 reps, Supine, AROM Long Arc Quad: AROM, Both, 10 reps, AAROM, Seated    General Comments        Pertinent Vitals/Pain Pain Assessment Pain Assessment: No/denies pain    Home Living                          Prior Function            PT Goals (current goals can now be found in the care plan section) Acute Rehab PT Goals Patient Stated Goal: none stated PT Goal Formulation: With patient Time For Goal Achievement: 11/30/21 Potential to Achieve Goals: Fair Progress towards PT goals: Progressing toward goals    Frequency    Min 5X/week      PT Plan Current plan remains appropriate    Co-evaluation              AM-PAC PT "6 Clicks" Mobility   Outcome Measure  Help needed turning from your back to your side while in a flat bed without using bedrails?: A Little Help needed moving from lying on your back to sitting on the side of a flat bed without using bedrails?: A Little Help needed moving to and from a bed to a chair (including a wheelchair)?: A Lot Help needed standing up from a chair using your arms (e.g., wheelchair or bedside chair)?: Total Help needed to walk in hospital room?: Total Help needed climbing 3-5 steps with a railing? : Total 6 Click Score: 11    End of Session Equipment Utilized During Treatment: Gait belt Activity Tolerance: Patient limited by fatigue Patient left: with call bell/phone within reach;in chair;with chair alarm set Nurse Communication: Mobility status;Precautions PT Visit Diagnosis: Other abnormalities  of gait and mobility (R26.89);Repeated falls (R29.6);Muscle weakness (generalized) (M62.81)     Time: 1194-1740 PT Time Calculation (min) (ACUTE ONLY): 23 min  Charges:  $Gait Training: 8-22 mins $Therapeutic Exercise: 8-22 mins                     [facility] M,PT Acute Rehab Services [phone]    Alvira Philips 11/16/2021, 1:03 PM

## 2021-11-16 NOTE — TOC Transition Note (Signed)
Transition of Care [facility]) - CM/SW Discharge Note   Patient Details  Name: JAKIN PAVAO MRN: [id number] Date of Birth: 12-24-1980  [facility] CM/SW Contact:  Lynett Grimes Phone Number: 11/16/2021, 12:19 PM   Clinical Narrative:    Patient will DC to: [facility] Anticipated DC date: 11/16/2021 Family notified: Pt Children Transport by: Sharin Mons   Per MD patient ready for DC to [facility]. RN to call report prior to discharge [phone]). RN, patient, patient's family, and facility notified of DC. Discharge Summary and FL2 sent to facility. DC packet on chart. Ambulance transport requested for patient.   CSW will sign off for now as social work intervention is no longer needed. Please consult [place] again if new needs arise.     Final next level of care: Skilled Nursing Facility Barriers to Discharge: Continued Medical Work up   Patient Goals and CMS Choice Patient states their goals for this hospitalization and ongoing recovery are:: Rehab CMS Medicare.gov Compare Post Acute Care list provided to:: Patient Choice offered to / list presented to : Patient  Discharge Placement                       Discharge Plan and Services In-house Referral: Clinical Social Work   Post Acute Care Choice: Skilled Nursing Facility                               Social Determinants of Health (SDOH) Interventions     Readmission Risk Interventions     No data to display

## 2021-11-16 NOTE — Progress Notes (Signed)
Occupational Therapy Treatment Patient Details Name: [name] MRN: [id number] DOB: 02-Feb-1981 Today's Date: 11/16/2021   History of present illness 41 yo male presented with BIL LE weakness, speech difficulty, and urinary incontinence. His symptoms became noticeable 1.5 weeks ago and began with weakness in his right lower extremity with difficulty walking. He states he's fallen 14 to 15 times in the past 2 weeks due to imbalance. PMH PMHx of relapsing remitting MS and HTN   OT comments  Pt completed a bathroom transfer this session with adls at sink level sitting task. Pt demonstrates balance and cognitive deficits. Pt is impulsive at times and needs cues for safety. Recommendation for CIR. Pt has been denied CIR so will need next level of care.    Recommendations for follow up therapy are one component of a multi-disciplinary discharge planning process, led by the attending physician.  Recommendations may be updated based on patient status, additional functional criteria and insurance authorization.    Follow Up Recommendations  Acute inpatient rehab (3hours/day)    Assistance Recommended at Discharge Intermittent Supervision/Assistance  Patient can return home with the following  Two people to help with walking and/or transfers;A lot of help with bathing/dressing/bathroom;Assist for transportation   Equipment Recommendations  BSC/3in1;Wheelchair (measurements OT);Wheelchair cushion (measurements OT)    Recommendations for Other Services Rehab consult    Precautions / Restrictions Precautions Precautions: Fall Precaution Comments: condom foley- dc in the bed prior to bed mobility session RN / NT notified       Mobility Bed Mobility Overal bed mobility: Needs Assistance Bed Mobility: Supine to Sit     Supine to sit: Min assist     General bed mobility comments: pt requires mod cues to sequence the task . pt using bil UE to lift bil LE off the eob and says legs hurt. pt  with R Ue reaching for rail and able to push off surface with L UE with cues. pt unable to sequence and would require increased (A)    Transfers Overall transfer level: Needs assistance Equipment used: Rolling walker (2 wheels) Transfers: Sit to/from Stand Sit to Stand: Min assist           General transfer comment: cues to keep walker on the ground and sequence inside the RW     Balance Overall balance assessment: Needs assistance Sitting-balance support: Bilateral upper extremity supported, Feet supported Sitting balance-Leahy Scale: Poor     Standing balance support: Bilateral upper extremity supported, During functional activity, Reliant on assistive device for balance Standing balance-Leahy Scale: Poor                             ADL either performed or assessed with clinical judgement   ADL Overall ADL's : Needs assistance/impaired Eating/Feeding: Minimal assistance;Bed level   Grooming: Oral care;Wash/dry face;Wash/dry hands;Applying deodorant;Moderate assistance;Sitting Grooming Details (indicate cue type and reason): pt attempting oral care with L UE. pt needs backward chaining with the paste already applied. pt over brushing so spillage noted. pt with posterior lean and needs (A) for upright posture at times. Upper Body Bathing: Moderate assistance;Sitting Upper Body Bathing Details (indicate cue type and reason): pt dropping wash cloth when attempting to wash L UE. pt asking for deordorant on arrival. pt provided that and new gown             Toilet Transfer: Minimal assistance;BSC/3in1;Rolling walker (2 wheels)  Functional mobility during ADLs: Moderate assistance;Rolling walker (2 wheels) General ADL Comments: pt completed bed to sink level transfer then stand pivot to recliner for oob.    Extremity/Trunk Assessment Upper Extremity Assessment Upper Extremity Assessment: RUE deficits/detail RUE Deficits / Details: using L hand again  despite being R handed for oral care            Vision       Perception     Praxis      Cognition Arousal/Alertness: Awake/alert Behavior During Therapy: WFL for tasks assessed/performed Overall Cognitive Status: Impaired/Different from baseline Area of Impairment: Safety/judgement                         Safety/Judgement: Decreased awareness of safety     General Comments: pt is impulsive and needs cues to remain seated. pt did have some concern of standing initially but was able to progress with transfers.        Exercises      Shoulder Instructions       General Comments      Pertinent Vitals/ Pain       Pain Assessment Pain Assessment: No/denies pain  Home Living                                          Prior Functioning/Environment              Frequency  Min 3X/week        Progress Toward Goals  OT Goals(current goals can now be found in the care plan section)  Progress towards OT goals: Progressing toward goals  Acute Rehab OT Goals Patient Stated Goal: to get better OT Goal Formulation: With patient Time For Goal Achievement: 11/23/21 Potential to Achieve Goals: Good ADL Goals Pt Will Perform Eating: with set-up;sitting;with adaptive utensils Pt Will Perform Grooming: with set-up;sitting;with adaptive equipment Pt Will Transfer to Toilet: with mod assist;stand pivot transfer;bedside commode Additional ADL Goal #1: pt will complete basic transfer min (A) as precursor to adls.  Plan Discharge plan remains appropriate    Co-evaluation                 AM-PAC OT "6 Clicks" Daily Activity     Outcome Measure   Help from another person eating meals?: A Little Help from another person taking care of personal grooming?: A Little Help from another person toileting, which includes using toliet, bedpan, or urinal?: A Lot Help from another person bathing (including washing, rinsing, drying)?: A Lot Help  from another person to put on and taking off regular upper body clothing?: A Lot Help from another person to put on and taking off regular lower body clothing?: A Lot 6 Click Score: 14    End of Session Equipment Utilized During Treatment: Gait belt;Rolling walker (2 wheels)  OT Visit Diagnosis: Unsteadiness on feet (R26.81);Repeated falls (R29.6);Muscle weakness (generalized) (M62.81)   Activity Tolerance Patient tolerated treatment well   Patient Left in chair;with call bell/phone within reach;with chair alarm set   Nurse Communication Mobility status;Precautions        Time: 1006-1040 OT Time Calculation (min): 34 min  Charges: OT General Charges $OT Visit: 1 Visit OT Treatments $Self Care/Home Management : 23-37 mins   Brynn, OTR/L  Acute Rehabilitation Services Office: [phone] .   Mateo Flow 11/16/2021, 11:05 AM

## 2021-11-17 DIAGNOSIS — F339 Major depressive disorder, recurrent, unspecified: Secondary | ICD-10-CM | POA: Diagnosis not present

## 2021-11-17 DIAGNOSIS — M6281 Muscle weakness (generalized): Secondary | ICD-10-CM | POA: Diagnosis not present

## 2021-11-17 DIAGNOSIS — Z720 Tobacco use: Secondary | ICD-10-CM | POA: Diagnosis not present

## 2021-11-17 DIAGNOSIS — F1210 Cannabis abuse, uncomplicated: Secondary | ICD-10-CM | POA: Diagnosis not present

## 2021-11-17 DIAGNOSIS — K219 Gastro-esophageal reflux disease without esophagitis: Secondary | ICD-10-CM | POA: Diagnosis not present

## 2021-11-17 DIAGNOSIS — G35 Multiple sclerosis: Secondary | ICD-10-CM | POA: Diagnosis not present

## 2021-11-17 DIAGNOSIS — I10 Essential (primary) hypertension: Secondary | ICD-10-CM | POA: Diagnosis not present

## 2021-11-24 DIAGNOSIS — F339 Major depressive disorder, recurrent, unspecified: Secondary | ICD-10-CM | POA: Diagnosis not present

## 2021-11-24 DIAGNOSIS — Z720 Tobacco use: Secondary | ICD-10-CM | POA: Diagnosis not present

## 2021-11-24 DIAGNOSIS — F1210 Cannabis abuse, uncomplicated: Secondary | ICD-10-CM | POA: Diagnosis not present

## 2021-11-24 DIAGNOSIS — I10 Essential (primary) hypertension: Secondary | ICD-10-CM | POA: Diagnosis not present

## 2021-11-24 DIAGNOSIS — I69822 Dysarthria following other cerebrovascular disease: Secondary | ICD-10-CM | POA: Diagnosis not present

## 2021-11-24 DIAGNOSIS — G35 Multiple sclerosis: Secondary | ICD-10-CM | POA: Diagnosis not present

## 2021-11-24 DIAGNOSIS — M6281 Muscle weakness (generalized): Secondary | ICD-10-CM | POA: Diagnosis not present

## 2021-11-29 DIAGNOSIS — I69822 Dysarthria following other cerebrovascular disease: Secondary | ICD-10-CM | POA: Diagnosis not present

## 2021-11-29 DIAGNOSIS — G35 Multiple sclerosis: Secondary | ICD-10-CM | POA: Diagnosis not present

## 2021-11-29 DIAGNOSIS — R5383 Other fatigue: Secondary | ICD-10-CM | POA: Diagnosis not present

## 2021-11-29 DIAGNOSIS — R4781 Slurred speech: Secondary | ICD-10-CM | POA: Diagnosis not present

## 2021-11-30 ENCOUNTER — Ambulatory Visit: Payer: Medicare HMO | Admitting: Internal Medicine

## 2021-11-30 DIAGNOSIS — M25561 Pain in right knee: Secondary | ICD-10-CM | POA: Diagnosis not present

## 2021-11-30 DIAGNOSIS — R262 Difficulty in walking, not elsewhere classified: Secondary | ICD-10-CM | POA: Diagnosis not present

## 2021-11-30 DIAGNOSIS — M6281 Muscle weakness (generalized): Secondary | ICD-10-CM | POA: Diagnosis not present

## 2021-11-30 DIAGNOSIS — M25562 Pain in left knee: Secondary | ICD-10-CM | POA: Diagnosis not present

## 2021-12-01 DIAGNOSIS — E559 Vitamin D deficiency, unspecified: Secondary | ICD-10-CM | POA: Diagnosis not present

## 2021-12-01 DIAGNOSIS — E119 Type 2 diabetes mellitus without complications: Secondary | ICD-10-CM | POA: Diagnosis not present

## 2021-12-02 DIAGNOSIS — R5383 Other fatigue: Secondary | ICD-10-CM | POA: Diagnosis not present

## 2021-12-02 DIAGNOSIS — G35 Multiple sclerosis: Secondary | ICD-10-CM | POA: Diagnosis not present

## 2021-12-15 DIAGNOSIS — U071 COVID-19: Secondary | ICD-10-CM | POA: Diagnosis not present

## 2021-12-15 DIAGNOSIS — G35 Multiple sclerosis: Secondary | ICD-10-CM | POA: Diagnosis not present

## 2021-12-27 DIAGNOSIS — M6281 Muscle weakness (generalized): Secondary | ICD-10-CM | POA: Diagnosis not present

## 2021-12-27 DIAGNOSIS — G35 Multiple sclerosis: Secondary | ICD-10-CM | POA: Diagnosis not present

## 2021-12-28 DIAGNOSIS — G35 Multiple sclerosis: Secondary | ICD-10-CM | POA: Diagnosis not present

## 2021-12-28 DIAGNOSIS — M6281 Muscle weakness (generalized): Secondary | ICD-10-CM | POA: Diagnosis not present

## 2021-12-29 DIAGNOSIS — G35 Multiple sclerosis: Secondary | ICD-10-CM | POA: Diagnosis not present

## 2021-12-29 DIAGNOSIS — M6281 Muscle weakness (generalized): Secondary | ICD-10-CM | POA: Diagnosis not present

## 2021-12-30 DIAGNOSIS — M6281 Muscle weakness (generalized): Secondary | ICD-10-CM | POA: Diagnosis not present

## 2021-12-30 DIAGNOSIS — G35 Multiple sclerosis: Secondary | ICD-10-CM | POA: Diagnosis not present

## 2021-12-30 DIAGNOSIS — R1312 Dysphagia, oropharyngeal phase: Secondary | ICD-10-CM | POA: Diagnosis not present

## 2021-12-30 DIAGNOSIS — R5383 Other fatigue: Secondary | ICD-10-CM | POA: Diagnosis not present

## 2021-12-30 DIAGNOSIS — M791 Myalgia, unspecified site: Secondary | ICD-10-CM | POA: Diagnosis not present

## 2021-12-31 DIAGNOSIS — M6281 Muscle weakness (generalized): Secondary | ICD-10-CM | POA: Diagnosis not present

## 2021-12-31 DIAGNOSIS — G35 Multiple sclerosis: Secondary | ICD-10-CM | POA: Diagnosis not present

## 2021-12-31 DIAGNOSIS — R1312 Dysphagia, oropharyngeal phase: Secondary | ICD-10-CM | POA: Diagnosis not present

## 2022-01-01 DIAGNOSIS — R1312 Dysphagia, oropharyngeal phase: Secondary | ICD-10-CM | POA: Diagnosis not present

## 2022-01-01 DIAGNOSIS — M6281 Muscle weakness (generalized): Secondary | ICD-10-CM | POA: Diagnosis not present

## 2022-01-01 DIAGNOSIS — G35 Multiple sclerosis: Secondary | ICD-10-CM | POA: Diagnosis not present

## 2022-01-02 DIAGNOSIS — M6281 Muscle weakness (generalized): Secondary | ICD-10-CM | POA: Diagnosis not present

## 2022-01-02 DIAGNOSIS — G35 Multiple sclerosis: Secondary | ICD-10-CM | POA: Diagnosis not present

## 2022-01-02 DIAGNOSIS — R1312 Dysphagia, oropharyngeal phase: Secondary | ICD-10-CM | POA: Diagnosis not present

## 2022-01-04 DIAGNOSIS — R55 Syncope and collapse: Secondary | ICD-10-CM | POA: Diagnosis not present

## 2022-01-04 DIAGNOSIS — W19XXXA Unspecified fall, initial encounter: Secondary | ICD-10-CM | POA: Diagnosis not present

## 2022-01-04 DIAGNOSIS — G35 Multiple sclerosis: Secondary | ICD-10-CM | POA: Diagnosis not present

## 2022-01-04 DIAGNOSIS — I509 Heart failure, unspecified: Secondary | ICD-10-CM | POA: Diagnosis not present

## 2022-01-04 DIAGNOSIS — M542 Cervicalgia: Secondary | ICD-10-CM | POA: Diagnosis not present

## 2022-01-04 DIAGNOSIS — M6281 Muscle weakness (generalized): Secondary | ICD-10-CM | POA: Diagnosis not present

## 2022-01-04 DIAGNOSIS — R1312 Dysphagia, oropharyngeal phase: Secondary | ICD-10-CM | POA: Diagnosis not present

## 2022-01-04 DIAGNOSIS — R4781 Slurred speech: Secondary | ICD-10-CM | POA: Diagnosis not present

## 2022-01-05 DIAGNOSIS — R5383 Other fatigue: Secondary | ICD-10-CM | POA: Diagnosis not present

## 2022-01-05 DIAGNOSIS — I951 Orthostatic hypotension: Secondary | ICD-10-CM | POA: Diagnosis not present

## 2022-01-05 DIAGNOSIS — G35 Multiple sclerosis: Secondary | ICD-10-CM | POA: Diagnosis not present

## 2022-01-05 DIAGNOSIS — M6281 Muscle weakness (generalized): Secondary | ICD-10-CM | POA: Diagnosis not present

## 2022-01-05 DIAGNOSIS — R1312 Dysphagia, oropharyngeal phase: Secondary | ICD-10-CM | POA: Diagnosis not present

## 2022-01-06 DIAGNOSIS — R197 Diarrhea, unspecified: Secondary | ICD-10-CM | POA: Diagnosis not present

## 2022-01-06 DIAGNOSIS — I951 Orthostatic hypotension: Secondary | ICD-10-CM | POA: Diagnosis not present

## 2022-01-06 DIAGNOSIS — G35 Multiple sclerosis: Secondary | ICD-10-CM | POA: Diagnosis not present

## 2022-01-09 DIAGNOSIS — R197 Diarrhea, unspecified: Secondary | ICD-10-CM | POA: Diagnosis not present

## 2022-01-09 DIAGNOSIS — G35 Multiple sclerosis: Secondary | ICD-10-CM | POA: Diagnosis not present

## 2022-01-09 DIAGNOSIS — R5383 Other fatigue: Secondary | ICD-10-CM | POA: Diagnosis not present

## 2022-01-09 DIAGNOSIS — M6281 Muscle weakness (generalized): Secondary | ICD-10-CM | POA: Diagnosis not present

## 2022-01-09 DIAGNOSIS — R1312 Dysphagia, oropharyngeal phase: Secondary | ICD-10-CM | POA: Diagnosis not present

## 2022-01-09 DIAGNOSIS — M791 Myalgia, unspecified site: Secondary | ICD-10-CM | POA: Diagnosis not present

## 2022-01-10 DIAGNOSIS — M6281 Muscle weakness (generalized): Secondary | ICD-10-CM | POA: Diagnosis not present

## 2022-01-10 DIAGNOSIS — R1312 Dysphagia, oropharyngeal phase: Secondary | ICD-10-CM | POA: Diagnosis not present

## 2022-01-10 DIAGNOSIS — G35 Multiple sclerosis: Secondary | ICD-10-CM | POA: Diagnosis not present

## 2022-01-10 DIAGNOSIS — I69822 Dysarthria following other cerebrovascular disease: Secondary | ICD-10-CM | POA: Diagnosis not present

## 2022-01-11 DIAGNOSIS — R1312 Dysphagia, oropharyngeal phase: Secondary | ICD-10-CM | POA: Diagnosis not present

## 2022-01-11 DIAGNOSIS — M6281 Muscle weakness (generalized): Secondary | ICD-10-CM | POA: Diagnosis not present

## 2022-01-11 DIAGNOSIS — R5383 Other fatigue: Secondary | ICD-10-CM | POA: Diagnosis not present

## 2022-01-11 DIAGNOSIS — R4781 Slurred speech: Secondary | ICD-10-CM | POA: Diagnosis not present

## 2022-01-11 DIAGNOSIS — G35 Multiple sclerosis: Secondary | ICD-10-CM | POA: Diagnosis not present

## 2022-01-11 DIAGNOSIS — U071 COVID-19: Secondary | ICD-10-CM | POA: Diagnosis not present

## 2022-01-12 DIAGNOSIS — R1312 Dysphagia, oropharyngeal phase: Secondary | ICD-10-CM | POA: Diagnosis not present

## 2022-01-12 DIAGNOSIS — M6281 Muscle weakness (generalized): Secondary | ICD-10-CM | POA: Diagnosis not present

## 2022-01-12 DIAGNOSIS — G35 Multiple sclerosis: Secondary | ICD-10-CM | POA: Diagnosis not present

## 2022-01-13 DIAGNOSIS — G35 Multiple sclerosis: Secondary | ICD-10-CM | POA: Diagnosis not present

## 2022-01-13 DIAGNOSIS — M6281 Muscle weakness (generalized): Secondary | ICD-10-CM | POA: Diagnosis not present

## 2022-01-13 DIAGNOSIS — R1312 Dysphagia, oropharyngeal phase: Secondary | ICD-10-CM | POA: Diagnosis not present

## 2022-01-16 DIAGNOSIS — I69822 Dysarthria following other cerebrovascular disease: Secondary | ICD-10-CM | POA: Diagnosis not present

## 2022-01-16 DIAGNOSIS — M6281 Muscle weakness (generalized): Secondary | ICD-10-CM | POA: Diagnosis not present

## 2022-01-16 DIAGNOSIS — R1312 Dysphagia, oropharyngeal phase: Secondary | ICD-10-CM | POA: Diagnosis not present

## 2022-01-16 DIAGNOSIS — G35 Multiple sclerosis: Secondary | ICD-10-CM | POA: Diagnosis not present

## 2022-01-16 DIAGNOSIS — F1210 Cannabis abuse, uncomplicated: Secondary | ICD-10-CM | POA: Diagnosis not present

## 2022-01-17 DIAGNOSIS — R1312 Dysphagia, oropharyngeal phase: Secondary | ICD-10-CM | POA: Diagnosis not present

## 2022-01-17 DIAGNOSIS — G35 Multiple sclerosis: Secondary | ICD-10-CM | POA: Diagnosis not present

## 2022-01-17 DIAGNOSIS — M6281 Muscle weakness (generalized): Secondary | ICD-10-CM | POA: Diagnosis not present

## 2022-01-17 DIAGNOSIS — I69822 Dysarthria following other cerebrovascular disease: Secondary | ICD-10-CM | POA: Diagnosis not present

## 2022-01-18 DIAGNOSIS — G35 Multiple sclerosis: Secondary | ICD-10-CM | POA: Diagnosis not present

## 2022-01-18 DIAGNOSIS — M6281 Muscle weakness (generalized): Secondary | ICD-10-CM | POA: Diagnosis not present

## 2022-01-18 DIAGNOSIS — R1312 Dysphagia, oropharyngeal phase: Secondary | ICD-10-CM | POA: Diagnosis not present

## 2022-01-19 DIAGNOSIS — F339 Major depressive disorder, recurrent, unspecified: Secondary | ICD-10-CM | POA: Diagnosis not present

## 2022-01-19 DIAGNOSIS — M791 Myalgia, unspecified site: Secondary | ICD-10-CM | POA: Diagnosis not present

## 2022-01-19 DIAGNOSIS — I10 Essential (primary) hypertension: Secondary | ICD-10-CM | POA: Diagnosis not present

## 2022-01-19 DIAGNOSIS — R1312 Dysphagia, oropharyngeal phase: Secondary | ICD-10-CM | POA: Diagnosis not present

## 2022-01-19 DIAGNOSIS — G35 Multiple sclerosis: Secondary | ICD-10-CM | POA: Diagnosis not present

## 2022-01-19 DIAGNOSIS — M6281 Muscle weakness (generalized): Secondary | ICD-10-CM | POA: Diagnosis not present

## 2022-01-20 DIAGNOSIS — R1312 Dysphagia, oropharyngeal phase: Secondary | ICD-10-CM | POA: Diagnosis not present

## 2022-01-20 DIAGNOSIS — F419 Anxiety disorder, unspecified: Secondary | ICD-10-CM | POA: Diagnosis not present

## 2022-01-20 DIAGNOSIS — F331 Major depressive disorder, recurrent, moderate: Secondary | ICD-10-CM | POA: Diagnosis not present

## 2022-01-20 DIAGNOSIS — M6281 Muscle weakness (generalized): Secondary | ICD-10-CM | POA: Diagnosis not present

## 2022-01-20 DIAGNOSIS — G35 Multiple sclerosis: Secondary | ICD-10-CM | POA: Diagnosis not present

## 2022-01-23 DIAGNOSIS — M6281 Muscle weakness (generalized): Secondary | ICD-10-CM | POA: Diagnosis not present

## 2022-01-23 DIAGNOSIS — F331 Major depressive disorder, recurrent, moderate: Secondary | ICD-10-CM | POA: Diagnosis not present

## 2022-01-23 DIAGNOSIS — R1312 Dysphagia, oropharyngeal phase: Secondary | ICD-10-CM | POA: Diagnosis not present

## 2022-01-23 DIAGNOSIS — G35 Multiple sclerosis: Secondary | ICD-10-CM | POA: Diagnosis not present

## 2022-01-24 DIAGNOSIS — G35 Multiple sclerosis: Secondary | ICD-10-CM | POA: Diagnosis not present

## 2022-01-24 DIAGNOSIS — M791 Myalgia, unspecified site: Secondary | ICD-10-CM | POA: Diagnosis not present

## 2022-01-24 DIAGNOSIS — R1312 Dysphagia, oropharyngeal phase: Secondary | ICD-10-CM | POA: Diagnosis not present

## 2022-01-25 DIAGNOSIS — M6281 Muscle weakness (generalized): Secondary | ICD-10-CM | POA: Diagnosis not present

## 2022-01-25 DIAGNOSIS — G35 Multiple sclerosis: Secondary | ICD-10-CM | POA: Diagnosis not present

## 2022-01-25 DIAGNOSIS — R1312 Dysphagia, oropharyngeal phase: Secondary | ICD-10-CM | POA: Diagnosis not present

## 2022-01-26 DIAGNOSIS — M6281 Muscle weakness (generalized): Secondary | ICD-10-CM | POA: Diagnosis not present

## 2022-01-26 DIAGNOSIS — R1312 Dysphagia, oropharyngeal phase: Secondary | ICD-10-CM | POA: Diagnosis not present

## 2022-01-26 DIAGNOSIS — G35 Multiple sclerosis: Secondary | ICD-10-CM | POA: Diagnosis not present

## 2022-01-27 DIAGNOSIS — M6281 Muscle weakness (generalized): Secondary | ICD-10-CM | POA: Diagnosis not present

## 2022-01-27 DIAGNOSIS — R1312 Dysphagia, oropharyngeal phase: Secondary | ICD-10-CM | POA: Diagnosis not present

## 2022-01-27 DIAGNOSIS — G35 Multiple sclerosis: Secondary | ICD-10-CM | POA: Diagnosis not present

## 2022-01-28 DIAGNOSIS — M6281 Muscle weakness (generalized): Secondary | ICD-10-CM | POA: Diagnosis not present

## 2022-01-28 DIAGNOSIS — G35 Multiple sclerosis: Secondary | ICD-10-CM | POA: Diagnosis not present

## 2022-01-28 DIAGNOSIS — R1312 Dysphagia, oropharyngeal phase: Secondary | ICD-10-CM | POA: Diagnosis not present

## 2022-01-30 DIAGNOSIS — F331 Major depressive disorder, recurrent, moderate: Secondary | ICD-10-CM | POA: Diagnosis not present

## 2022-01-30 DIAGNOSIS — G35 Multiple sclerosis: Secondary | ICD-10-CM | POA: Diagnosis not present

## 2022-01-30 DIAGNOSIS — R1312 Dysphagia, oropharyngeal phase: Secondary | ICD-10-CM | POA: Diagnosis not present

## 2022-01-31 DIAGNOSIS — R1312 Dysphagia, oropharyngeal phase: Secondary | ICD-10-CM | POA: Diagnosis not present

## 2022-01-31 DIAGNOSIS — G35 Multiple sclerosis: Secondary | ICD-10-CM | POA: Diagnosis not present

## 2022-02-01 DIAGNOSIS — G35 Multiple sclerosis: Secondary | ICD-10-CM | POA: Diagnosis not present

## 2022-02-01 DIAGNOSIS — R1312 Dysphagia, oropharyngeal phase: Secondary | ICD-10-CM | POA: Diagnosis not present

## 2022-02-02 DIAGNOSIS — G35 Multiple sclerosis: Secondary | ICD-10-CM | POA: Diagnosis not present

## 2022-02-02 DIAGNOSIS — R1312 Dysphagia, oropharyngeal phase: Secondary | ICD-10-CM | POA: Diagnosis not present

## 2022-02-03 DIAGNOSIS — R1312 Dysphagia, oropharyngeal phase: Secondary | ICD-10-CM | POA: Diagnosis not present

## 2022-02-03 DIAGNOSIS — G35 Multiple sclerosis: Secondary | ICD-10-CM | POA: Diagnosis not present

## 2022-02-06 DIAGNOSIS — R1312 Dysphagia, oropharyngeal phase: Secondary | ICD-10-CM | POA: Diagnosis not present

## 2022-02-06 DIAGNOSIS — G35 Multiple sclerosis: Secondary | ICD-10-CM | POA: Diagnosis not present

## 2022-02-06 DIAGNOSIS — F331 Major depressive disorder, recurrent, moderate: Secondary | ICD-10-CM | POA: Diagnosis not present

## 2022-02-16 DIAGNOSIS — F331 Major depressive disorder, recurrent, moderate: Secondary | ICD-10-CM | POA: Diagnosis not present

## 2022-02-16 DIAGNOSIS — F419 Anxiety disorder, unspecified: Secondary | ICD-10-CM | POA: Diagnosis not present

## 2022-02-16 DIAGNOSIS — F5105 Insomnia due to other mental disorder: Secondary | ICD-10-CM | POA: Diagnosis not present

## 2022-02-20 DIAGNOSIS — F331 Major depressive disorder, recurrent, moderate: Secondary | ICD-10-CM | POA: Diagnosis not present

## 2022-02-27 DIAGNOSIS — G35 Multiple sclerosis: Secondary | ICD-10-CM | POA: Diagnosis not present

## 2022-02-27 DIAGNOSIS — I69822 Dysarthria following other cerebrovascular disease: Secondary | ICD-10-CM | POA: Diagnosis not present

## 2022-02-27 DIAGNOSIS — F339 Major depressive disorder, recurrent, unspecified: Secondary | ICD-10-CM | POA: Diagnosis not present

## 2022-02-27 DIAGNOSIS — M6281 Muscle weakness (generalized): Secondary | ICD-10-CM | POA: Diagnosis not present

## 2022-02-27 DIAGNOSIS — R197 Diarrhea, unspecified: Secondary | ICD-10-CM | POA: Diagnosis not present

## 2022-03-03 DIAGNOSIS — R4781 Slurred speech: Secondary | ICD-10-CM | POA: Diagnosis not present

## 2022-03-03 DIAGNOSIS — M6281 Muscle weakness (generalized): Secondary | ICD-10-CM | POA: Diagnosis not present

## 2022-03-03 DIAGNOSIS — I10 Essential (primary) hypertension: Secondary | ICD-10-CM | POA: Diagnosis not present

## 2022-03-03 DIAGNOSIS — F339 Major depressive disorder, recurrent, unspecified: Secondary | ICD-10-CM | POA: Diagnosis not present

## 2022-03-03 DIAGNOSIS — G35 Multiple sclerosis: Secondary | ICD-10-CM | POA: Diagnosis not present

## 2022-03-06 DIAGNOSIS — F331 Major depressive disorder, recurrent, moderate: Secondary | ICD-10-CM | POA: Diagnosis not present

## 2022-03-08 DIAGNOSIS — B351 Tinea unguium: Secondary | ICD-10-CM | POA: Diagnosis not present

## 2022-03-08 DIAGNOSIS — M79674 Pain in right toe(s): Secondary | ICD-10-CM | POA: Diagnosis not present

## 2022-03-08 DIAGNOSIS — M79675 Pain in left toe(s): Secondary | ICD-10-CM | POA: Diagnosis not present

## 2022-03-15 DIAGNOSIS — R1312 Dysphagia, oropharyngeal phase: Secondary | ICD-10-CM | POA: Diagnosis not present

## 2022-03-15 DIAGNOSIS — F331 Major depressive disorder, recurrent, moderate: Secondary | ICD-10-CM | POA: Diagnosis not present

## 2022-03-15 DIAGNOSIS — G35 Multiple sclerosis: Secondary | ICD-10-CM | POA: Diagnosis not present

## 2022-03-16 DIAGNOSIS — F331 Major depressive disorder, recurrent, moderate: Secondary | ICD-10-CM | POA: Diagnosis not present

## 2022-03-16 DIAGNOSIS — F5105 Insomnia due to other mental disorder: Secondary | ICD-10-CM | POA: Diagnosis not present

## 2022-03-16 DIAGNOSIS — F419 Anxiety disorder, unspecified: Secondary | ICD-10-CM | POA: Diagnosis not present

## 2022-03-17 DIAGNOSIS — R1312 Dysphagia, oropharyngeal phase: Secondary | ICD-10-CM | POA: Diagnosis not present

## 2022-03-17 DIAGNOSIS — G35 Multiple sclerosis: Secondary | ICD-10-CM | POA: Diagnosis not present

## 2022-03-19 DIAGNOSIS — R1312 Dysphagia, oropharyngeal phase: Secondary | ICD-10-CM | POA: Diagnosis not present

## 2022-03-19 DIAGNOSIS — G35 Multiple sclerosis: Secondary | ICD-10-CM | POA: Diagnosis not present

## 2022-03-20 DIAGNOSIS — G35 Multiple sclerosis: Secondary | ICD-10-CM | POA: Diagnosis not present

## 2022-03-20 DIAGNOSIS — R1312 Dysphagia, oropharyngeal phase: Secondary | ICD-10-CM | POA: Diagnosis not present

## 2022-03-20 DIAGNOSIS — F331 Major depressive disorder, recurrent, moderate: Secondary | ICD-10-CM | POA: Diagnosis not present

## 2022-03-21 DIAGNOSIS — G35 Multiple sclerosis: Secondary | ICD-10-CM | POA: Diagnosis not present

## 2022-03-21 DIAGNOSIS — R1312 Dysphagia, oropharyngeal phase: Secondary | ICD-10-CM | POA: Diagnosis not present

## 2022-03-22 DIAGNOSIS — R1312 Dysphagia, oropharyngeal phase: Secondary | ICD-10-CM | POA: Diagnosis not present

## 2022-03-22 DIAGNOSIS — G35 Multiple sclerosis: Secondary | ICD-10-CM | POA: Diagnosis not present

## 2022-03-23 DIAGNOSIS — G35 Multiple sclerosis: Secondary | ICD-10-CM | POA: Diagnosis not present

## 2022-03-23 DIAGNOSIS — R1312 Dysphagia, oropharyngeal phase: Secondary | ICD-10-CM | POA: Diagnosis not present

## 2022-03-27 DIAGNOSIS — G35 Multiple sclerosis: Secondary | ICD-10-CM | POA: Diagnosis not present

## 2022-03-27 DIAGNOSIS — R1312 Dysphagia, oropharyngeal phase: Secondary | ICD-10-CM | POA: Diagnosis not present

## 2022-03-28 DIAGNOSIS — G35 Multiple sclerosis: Secondary | ICD-10-CM | POA: Diagnosis not present

## 2022-03-28 DIAGNOSIS — R1312 Dysphagia, oropharyngeal phase: Secondary | ICD-10-CM | POA: Diagnosis not present

## 2022-03-29 DIAGNOSIS — R1312 Dysphagia, oropharyngeal phase: Secondary | ICD-10-CM | POA: Diagnosis not present

## 2022-03-29 DIAGNOSIS — G35 Multiple sclerosis: Secondary | ICD-10-CM | POA: Diagnosis not present

## 2022-03-30 DIAGNOSIS — R1312 Dysphagia, oropharyngeal phase: Secondary | ICD-10-CM | POA: Diagnosis not present

## 2022-03-30 DIAGNOSIS — F419 Anxiety disorder, unspecified: Secondary | ICD-10-CM | POA: Diagnosis not present

## 2022-03-30 DIAGNOSIS — G35 Multiple sclerosis: Secondary | ICD-10-CM | POA: Diagnosis not present

## 2022-03-30 DIAGNOSIS — F5105 Insomnia due to other mental disorder: Secondary | ICD-10-CM | POA: Diagnosis not present

## 2022-03-30 DIAGNOSIS — F331 Major depressive disorder, recurrent, moderate: Secondary | ICD-10-CM | POA: Diagnosis not present

## 2022-04-03 DIAGNOSIS — F331 Major depressive disorder, recurrent, moderate: Secondary | ICD-10-CM | POA: Diagnosis not present

## 2022-04-08 DIAGNOSIS — M6281 Muscle weakness (generalized): Secondary | ICD-10-CM | POA: Diagnosis not present

## 2022-04-08 DIAGNOSIS — G35 Multiple sclerosis: Secondary | ICD-10-CM | POA: Diagnosis not present

## 2022-04-08 DIAGNOSIS — R1312 Dysphagia, oropharyngeal phase: Secondary | ICD-10-CM | POA: Diagnosis not present

## 2022-04-09 DIAGNOSIS — G35 Multiple sclerosis: Secondary | ICD-10-CM | POA: Diagnosis not present

## 2022-04-09 DIAGNOSIS — R1312 Dysphagia, oropharyngeal phase: Secondary | ICD-10-CM | POA: Diagnosis not present

## 2022-04-09 DIAGNOSIS — M6281 Muscle weakness (generalized): Secondary | ICD-10-CM | POA: Diagnosis not present

## 2022-04-10 DIAGNOSIS — F331 Major depressive disorder, recurrent, moderate: Secondary | ICD-10-CM | POA: Diagnosis not present

## 2022-04-11 DIAGNOSIS — R1312 Dysphagia, oropharyngeal phase: Secondary | ICD-10-CM | POA: Diagnosis not present

## 2022-04-11 DIAGNOSIS — M6281 Muscle weakness (generalized): Secondary | ICD-10-CM | POA: Diagnosis not present

## 2022-04-11 DIAGNOSIS — G35 Multiple sclerosis: Secondary | ICD-10-CM | POA: Diagnosis not present

## 2022-04-12 DIAGNOSIS — M6281 Muscle weakness (generalized): Secondary | ICD-10-CM | POA: Diagnosis not present

## 2022-04-12 DIAGNOSIS — G35 Multiple sclerosis: Secondary | ICD-10-CM | POA: Diagnosis not present

## 2022-04-12 DIAGNOSIS — R1312 Dysphagia, oropharyngeal phase: Secondary | ICD-10-CM | POA: Diagnosis not present

## 2022-04-13 DIAGNOSIS — W19XXXA Unspecified fall, initial encounter: Secondary | ICD-10-CM | POA: Diagnosis not present

## 2022-04-13 DIAGNOSIS — F331 Major depressive disorder, recurrent, moderate: Secondary | ICD-10-CM | POA: Diagnosis not present

## 2022-04-13 DIAGNOSIS — F419 Anxiety disorder, unspecified: Secondary | ICD-10-CM | POA: Diagnosis not present

## 2022-04-13 DIAGNOSIS — M6281 Muscle weakness (generalized): Secondary | ICD-10-CM | POA: Diagnosis not present

## 2022-04-13 DIAGNOSIS — G35 Multiple sclerosis: Secondary | ICD-10-CM | POA: Diagnosis not present

## 2022-04-13 DIAGNOSIS — F5105 Insomnia due to other mental disorder: Secondary | ICD-10-CM | POA: Diagnosis not present

## 2022-04-13 DIAGNOSIS — M25511 Pain in right shoulder: Secondary | ICD-10-CM | POA: Diagnosis not present

## 2022-04-13 DIAGNOSIS — R1312 Dysphagia, oropharyngeal phase: Secondary | ICD-10-CM | POA: Diagnosis not present

## 2022-04-14 DIAGNOSIS — G35 Multiple sclerosis: Secondary | ICD-10-CM | POA: Diagnosis not present

## 2022-04-14 DIAGNOSIS — M6281 Muscle weakness (generalized): Secondary | ICD-10-CM | POA: Diagnosis not present

## 2022-04-14 DIAGNOSIS — M25511 Pain in right shoulder: Secondary | ICD-10-CM | POA: Diagnosis not present

## 2022-04-14 DIAGNOSIS — I10 Essential (primary) hypertension: Secondary | ICD-10-CM | POA: Diagnosis not present

## 2022-04-14 DIAGNOSIS — M25512 Pain in left shoulder: Secondary | ICD-10-CM | POA: Diagnosis not present

## 2022-04-14 DIAGNOSIS — R1312 Dysphagia, oropharyngeal phase: Secondary | ICD-10-CM | POA: Diagnosis not present

## 2022-04-14 DIAGNOSIS — F339 Major depressive disorder, recurrent, unspecified: Secondary | ICD-10-CM | POA: Diagnosis not present

## 2022-04-16 DIAGNOSIS — M6281 Muscle weakness (generalized): Secondary | ICD-10-CM | POA: Diagnosis not present

## 2022-04-16 DIAGNOSIS — G35 Multiple sclerosis: Secondary | ICD-10-CM | POA: Diagnosis not present

## 2022-04-16 DIAGNOSIS — R1312 Dysphagia, oropharyngeal phase: Secondary | ICD-10-CM | POA: Diagnosis not present

## 2022-04-17 DIAGNOSIS — F331 Major depressive disorder, recurrent, moderate: Secondary | ICD-10-CM | POA: Diagnosis not present

## 2022-04-18 DIAGNOSIS — M6281 Muscle weakness (generalized): Secondary | ICD-10-CM | POA: Diagnosis not present

## 2022-04-18 DIAGNOSIS — R1312 Dysphagia, oropharyngeal phase: Secondary | ICD-10-CM | POA: Diagnosis not present

## 2022-04-18 DIAGNOSIS — G35 Multiple sclerosis: Secondary | ICD-10-CM | POA: Diagnosis not present

## 2022-04-19 DIAGNOSIS — M6281 Muscle weakness (generalized): Secondary | ICD-10-CM | POA: Diagnosis not present

## 2022-04-19 DIAGNOSIS — R1312 Dysphagia, oropharyngeal phase: Secondary | ICD-10-CM | POA: Diagnosis not present

## 2022-04-19 DIAGNOSIS — G35 Multiple sclerosis: Secondary | ICD-10-CM | POA: Diagnosis not present

## 2022-04-20 DIAGNOSIS — G35 Multiple sclerosis: Secondary | ICD-10-CM | POA: Diagnosis not present

## 2022-04-20 DIAGNOSIS — R1312 Dysphagia, oropharyngeal phase: Secondary | ICD-10-CM | POA: Diagnosis not present

## 2022-04-20 DIAGNOSIS — M6281 Muscle weakness (generalized): Secondary | ICD-10-CM | POA: Diagnosis not present

## 2022-04-21 DIAGNOSIS — M6281 Muscle weakness (generalized): Secondary | ICD-10-CM | POA: Diagnosis not present

## 2022-04-21 DIAGNOSIS — R1312 Dysphagia, oropharyngeal phase: Secondary | ICD-10-CM | POA: Diagnosis not present

## 2022-04-21 DIAGNOSIS — G35 Multiple sclerosis: Secondary | ICD-10-CM | POA: Diagnosis not present

## 2022-04-25 DIAGNOSIS — T300 Burn of unspecified body region, unspecified degree: Secondary | ICD-10-CM | POA: Diagnosis not present

## 2022-05-02 DIAGNOSIS — F331 Major depressive disorder, recurrent, moderate: Secondary | ICD-10-CM | POA: Diagnosis not present

## 2022-05-08 DIAGNOSIS — M2041 Other hammer toe(s) (acquired), right foot: Secondary | ICD-10-CM | POA: Diagnosis not present

## 2022-05-08 DIAGNOSIS — M2042 Other hammer toe(s) (acquired), left foot: Secondary | ICD-10-CM | POA: Diagnosis not present

## 2022-05-08 DIAGNOSIS — G459 Transient cerebral ischemic attack, unspecified: Secondary | ICD-10-CM | POA: Diagnosis not present

## 2022-05-08 DIAGNOSIS — B351 Tinea unguium: Secondary | ICD-10-CM | POA: Diagnosis not present

## 2022-05-08 DIAGNOSIS — L603 Nail dystrophy: Secondary | ICD-10-CM | POA: Diagnosis not present

## 2022-05-08 DIAGNOSIS — G4700 Insomnia, unspecified: Secondary | ICD-10-CM | POA: Diagnosis not present

## 2022-05-18 DIAGNOSIS — F419 Anxiety disorder, unspecified: Secondary | ICD-10-CM | POA: Diagnosis not present

## 2022-05-18 DIAGNOSIS — F5105 Insomnia due to other mental disorder: Secondary | ICD-10-CM | POA: Diagnosis not present

## 2022-05-18 DIAGNOSIS — F331 Major depressive disorder, recurrent, moderate: Secondary | ICD-10-CM | POA: Diagnosis not present

## 2022-05-26 DIAGNOSIS — G35 Multiple sclerosis: Secondary | ICD-10-CM | POA: Diagnosis not present

## 2022-05-26 DIAGNOSIS — K219 Gastro-esophageal reflux disease without esophagitis: Secondary | ICD-10-CM | POA: Diagnosis not present

## 2022-05-29 DIAGNOSIS — F331 Major depressive disorder, recurrent, moderate: Secondary | ICD-10-CM | POA: Diagnosis not present

## 2022-05-29 DIAGNOSIS — S91101A Unspecified open wound of right great toe without damage to nail, initial encounter: Secondary | ICD-10-CM | POA: Diagnosis not present

## 2023-01-31 ENCOUNTER — Emergency Department (HOSPITAL_COMMUNITY)

## 2023-01-31 ENCOUNTER — Inpatient Hospital Stay (HOSPITAL_COMMUNITY)
Admission: EM | Admit: 2023-01-31 | Discharge: 2023-02-05 | DRG: 871 | Disposition: A | Discharge status: Other Acute Inpatient Hospital | Source: Skilled Nursing Facility | Attending: Family Medicine | Admitting: Family Medicine

## 2023-01-31 ENCOUNTER — Encounter (HOSPITAL_COMMUNITY): Payer: Self-pay

## 2023-01-31 ENCOUNTER — Other Ambulatory Visit: Payer: Self-pay

## 2023-01-31 DIAGNOSIS — E440 Moderate protein-calorie malnutrition: Secondary | ICD-10-CM | POA: Diagnosis present

## 2023-01-31 DIAGNOSIS — J690 Pneumonitis due to inhalation of food and vomit: Principal | ICD-10-CM | POA: Diagnosis present

## 2023-01-31 DIAGNOSIS — R1310 Dysphagia, unspecified: Secondary | ICD-10-CM | POA: Diagnosis not present

## 2023-01-31 DIAGNOSIS — G8929 Other chronic pain: Secondary | ICD-10-CM | POA: Diagnosis present

## 2023-01-31 DIAGNOSIS — Z79899 Other long term (current) drug therapy: Secondary | ICD-10-CM

## 2023-01-31 DIAGNOSIS — Z993 Dependence on wheelchair: Secondary | ICD-10-CM | POA: Diagnosis not present

## 2023-01-31 DIAGNOSIS — I2693 Single subsegmental pulmonary embolism without acute cor pulmonale: Secondary | ICD-10-CM | POA: Diagnosis present

## 2023-01-31 DIAGNOSIS — J9601 Acute respiratory failure with hypoxia: Secondary | ICD-10-CM | POA: Diagnosis not present

## 2023-01-31 DIAGNOSIS — M25559 Pain in unspecified hip: Secondary | ICD-10-CM | POA: Diagnosis present

## 2023-01-31 DIAGNOSIS — E119 Type 2 diabetes mellitus without complications: Secondary | ICD-10-CM | POA: Diagnosis present

## 2023-01-31 DIAGNOSIS — F32A Depression, unspecified: Secondary | ICD-10-CM | POA: Diagnosis present

## 2023-01-31 DIAGNOSIS — Z1152 Encounter for screening for COVID-19: Secondary | ICD-10-CM

## 2023-01-31 DIAGNOSIS — N319 Neuromuscular dysfunction of bladder, unspecified: Secondary | ICD-10-CM | POA: Diagnosis present

## 2023-01-31 DIAGNOSIS — Z86711 Personal history of pulmonary embolism: Secondary | ICD-10-CM | POA: Insufficient documentation

## 2023-01-31 DIAGNOSIS — Z515 Encounter for palliative care: Secondary | ICD-10-CM

## 2023-01-31 DIAGNOSIS — R54 Age-related physical debility: Secondary | ICD-10-CM | POA: Diagnosis present

## 2023-01-31 DIAGNOSIS — G8220 Paraplegia, unspecified: Secondary | ICD-10-CM | POA: Diagnosis present

## 2023-01-31 DIAGNOSIS — Z7401 Bed confinement status: Secondary | ICD-10-CM | POA: Diagnosis not present

## 2023-01-31 DIAGNOSIS — E860 Dehydration: Secondary | ICD-10-CM | POA: Diagnosis present

## 2023-01-31 DIAGNOSIS — E876 Hypokalemia: Secondary | ICD-10-CM | POA: Diagnosis not present

## 2023-01-31 DIAGNOSIS — E871 Hypo-osmolality and hyponatremia: Secondary | ICD-10-CM | POA: Diagnosis present

## 2023-01-31 DIAGNOSIS — G35 Multiple sclerosis: Secondary | ICD-10-CM | POA: Diagnosis present

## 2023-01-31 DIAGNOSIS — E870 Hyperosmolality and hypernatremia: Secondary | ICD-10-CM | POA: Diagnosis not present

## 2023-01-31 DIAGNOSIS — R1319 Other dysphagia: Secondary | ICD-10-CM | POA: Diagnosis present

## 2023-01-31 DIAGNOSIS — A419 Sepsis, unspecified organism: Secondary | ICD-10-CM | POA: Diagnosis not present

## 2023-01-31 DIAGNOSIS — Z6824 Body mass index (BMI) 24.0-24.9, adult: Secondary | ICD-10-CM

## 2023-01-31 DIAGNOSIS — F17210 Nicotine dependence, cigarettes, uncomplicated: Secondary | ICD-10-CM | POA: Diagnosis not present

## 2023-01-31 DIAGNOSIS — Z820 Family history of epilepsy and other diseases of the nervous system: Secondary | ICD-10-CM

## 2023-01-31 DIAGNOSIS — Z8249 Family history of ischemic heart disease and other diseases of the circulatory system: Secondary | ICD-10-CM

## 2023-01-31 DIAGNOSIS — R6520 Severe sepsis without septic shock: Secondary | ICD-10-CM | POA: Diagnosis not present

## 2023-01-31 DIAGNOSIS — Z638 Other specified problems related to primary support group: Secondary | ICD-10-CM

## 2023-01-31 DIAGNOSIS — I10 Essential (primary) hypertension: Secondary | ICD-10-CM | POA: Diagnosis not present

## 2023-01-31 DIAGNOSIS — I2699 Other pulmonary embolism without acute cor pulmonale: Secondary | ICD-10-CM

## 2023-01-31 HISTORY — DX: Sepsis, unspecified organism: R65.20

## 2023-01-31 LAB — URINALYSIS, W/ REFLEX TO CULTURE (INFECTION SUSPECTED)
Bacteria, UA: NONE SEEN
Bilirubin Urine: NEGATIVE
Glucose, UA: NEGATIVE mg/dL
Ketones, ur: 5 mg/dL — AB
Nitrite: NEGATIVE
Protein, ur: 30 mg/dL — AB
Specific Gravity, Urine: 1.027 (ref 1.005–1.030)
pH: 5 (ref 5.0–8.0)

## 2023-01-31 LAB — BLOOD GAS, VENOUS
Acid-Base Excess: 3.6 mmol/L — ABNORMAL HIGH (ref 0.0–2.0)
Bicarbonate: 26.7 mmol/L (ref 20.0–28.0)
O2 Saturation: 72.8 %
Patient temperature: 37
pCO2, Ven: 35 mm[Hg] — ABNORMAL LOW (ref 44–60)
pH, Ven: 7.49 — ABNORMAL HIGH (ref 7.25–7.43)
pO2, Ven: 38 mm[Hg] (ref 32–45)

## 2023-01-31 LAB — PROTIME-INR
INR: 1.4 — ABNORMAL HIGH (ref 0.8–1.2)
Prothrombin Time: 17.6 s — ABNORMAL HIGH (ref 11.4–15.2)

## 2023-01-31 LAB — BASIC METABOLIC PANEL
Anion gap: 11 (ref 5–15)
BUN: 21 mg/dL — ABNORMAL HIGH (ref 6–20)
CO2: 23 mmol/L (ref 22–32)
Calcium: 8.3 mg/dL — ABNORMAL LOW (ref 8.9–10.3)
Chloride: 123 mmol/L — ABNORMAL HIGH (ref 98–111)
Creatinine, Ser: 0.65 mg/dL (ref 0.61–1.24)
GFR, Estimated: >60 mL/min (ref >60)
Glucose, Bld: 122 mg/dL — ABNORMAL HIGH (ref 70–99)
Potassium: 3.4 mmol/L — ABNORMAL LOW (ref 3.5–5.1)
Sodium: 157 mmol/L — ABNORMAL HIGH (ref 135–145)

## 2023-01-31 LAB — COMPREHENSIVE METABOLIC PANEL
ALT: 31 U/L (ref 0–44)
AST: 26 U/L (ref 15–41)
Albumin: 3.4 g/dL — ABNORMAL LOW (ref 3.5–5.0)
Alkaline Phosphatase: 75 U/L (ref 38–126)
Anion gap: 12 (ref 5–15)
BUN: 23 mg/dL — ABNORMAL HIGH (ref 6–20)
CO2: 24 mmol/L (ref 22–32)
Calcium: 9.5 mg/dL (ref 8.9–10.3)
Chloride: 120 mmol/L — ABNORMAL HIGH (ref 98–111)
Creatinine, Ser: 0.56 mg/dL — ABNORMAL LOW (ref 0.61–1.24)
GFR, Estimated: >60 mL/min (ref >60)
Glucose, Bld: 180 mg/dL — ABNORMAL HIGH (ref 70–99)
Potassium: 3.3 mmol/L — ABNORMAL LOW (ref 3.5–5.1)
Sodium: 156 mmol/L — ABNORMAL HIGH (ref 135–145)
Total Bilirubin: 0.8 mg/dL (ref 0.3–1.2)
Total Protein: 8.1 g/dL (ref 6.5–8.1)

## 2023-01-31 LAB — CBC WITH DIFFERENTIAL/PLATELET
Abs Immature Granulocytes: 0.04 10*3/uL (ref 0.00–0.07)
Basophils Absolute: 0.1 10*3/uL (ref 0.0–0.1)
Basophils Relative: 1 %
Eosinophils Absolute: 0.1 10*3/uL (ref 0.0–0.5)
Eosinophils Relative: 1 %
HCT: 47.8 % (ref 39.0–52.0)
Hemoglobin: 14.5 g/dL (ref 13.0–17.0)
Immature Granulocytes: 0 %
Lymphocytes Relative: 18 %
Lymphs Abs: 2 10*3/uL (ref 0.7–4.0)
MCH: 27.5 pg (ref 26.0–34.0)
MCHC: 30.3 g/dL (ref 30.0–36.0)
MCV: 90.7 fL (ref 80.0–100.0)
Monocytes Absolute: 0.6 10*3/uL (ref 0.1–1.0)
Monocytes Relative: 5 %
Neutro Abs: 8.2 10*3/uL — ABNORMAL HIGH (ref 1.7–7.7)
Neutrophils Relative %: 75 %
Platelets: 358 10*3/uL (ref 150–400)
RBC: 5.27 MIL/uL (ref 4.22–5.81)
RDW: 13.8 % (ref 11.5–15.5)
WBC: 10.9 10*3/uL — ABNORMAL HIGH (ref 4.0–10.5)
nRBC: 0 % (ref 0.0–0.2)

## 2023-01-31 LAB — MRSA NEXT GEN BY PCR, NASAL: MRSA by PCR Next Gen: NOT DETECTED

## 2023-01-31 LAB — RESP PANEL BY RT-PCR (RSV, FLU A&B, COVID)  RVPGX2
Influenza A by PCR: NEGATIVE
Influenza B by PCR: NEGATIVE
Resp Syncytial Virus by PCR: NEGATIVE
SARS Coronavirus 2 by RT PCR: NEGATIVE

## 2023-01-31 LAB — I-STAT CG4 LACTIC ACID, ED
Lactic Acid, Venous: 1.9 mmol/L (ref 0.5–1.9)
Lactic Acid, Venous: 1.7 mmol/L (ref 0.5–1.9)

## 2023-01-31 LAB — APTT: aPTT: 30 s (ref 24–36)

## 2023-01-31 MED ORDER — ONDANSETRON HCL 4 MG/2ML IJ SOLN: 4.0000 mg | Freq: Four times a day (QID) | INTRAMUSCULAR | Status: DC | PRN

## 2023-01-31 MED ORDER — ONDANSETRON HCL 4 MG PO TABS: 4.0000 mg | ORAL_TABLET | Freq: Four times a day (QID) | ORAL | Status: DC | PRN

## 2023-01-31 MED ORDER — SODIUM CHLORIDE 0.9 % IV BOLUS
2500.0000 mL | Freq: Once | INTRAVENOUS | Status: AC
  Administered 2023-01-31: 2500 mL via INTRAVENOUS

## 2023-01-31 MED ORDER — ACETAMINOPHEN 650 MG RE SUPP: 650.0000 mg | Freq: Four times a day (QID) | RECTAL | Status: DC | PRN

## 2023-01-31 MED ORDER — SODIUM CHLORIDE 0.9 % IV SOLN: INTRAVENOUS | Status: DC

## 2023-01-31 MED ORDER — IOHEXOL 350 MG/ML SOLN
100.0000 mL | Freq: Once | INTRAVENOUS | Status: AC | PRN
  Administered 2023-01-31: 100 mL via INTRAVENOUS

## 2023-01-31 MED ORDER — SERTRALINE HCL 100 MG PO TABS: 100.0000 mg | ORAL_TABLET | Freq: Every day | ORAL | Status: DC

## 2023-01-31 MED ORDER — CHLORHEXIDINE GLUCONATE CLOTH 2 % EX PADS
6.0000 | MEDICATED_PAD | Freq: Every day | CUTANEOUS | Status: DC
  Administered 2023-01-31 – 2023-02-05 (×6): 6 via TOPICAL

## 2023-01-31 MED ORDER — SODIUM CHLORIDE 0.9 % IV SOLN
500.0000 mg | Freq: Once | INTRAVENOUS | Status: AC
  Administered 2023-01-31: 500 mg via INTRAVENOUS
  Filled 2023-01-31: qty 5

## 2023-01-31 MED ORDER — ALBUTEROL SULFATE (2.5 MG/3ML) 0.083% IN NEBU: 2.5000 mg | INHALATION_SOLUTION | RESPIRATORY_TRACT | Status: DC | PRN

## 2023-01-31 MED ORDER — SODIUM CHLORIDE 0.9 % IV SOLN
3.0000 g | Freq: Four times a day (QID) | INTRAVENOUS | Status: DC
  Administered 2023-01-31 – 2023-02-04 (×15): 3 g via INTRAVENOUS
  Filled 2023-01-31 (×17): qty 8

## 2023-01-31 MED ORDER — VANCOMYCIN HCL 1750 MG/350ML IV SOLN
1750.0000 mg | Freq: Once | INTRAVENOUS | Status: AC
  Administered 2023-01-31: 1750 mg via INTRAVENOUS
  Filled 2023-01-31: qty 350

## 2023-01-31 MED ORDER — SODIUM CHLORIDE 0.9 % IV SOLN
1.0000 g | Freq: Once | INTRAVENOUS | Status: AC
  Administered 2023-01-31: 1 g via INTRAVENOUS
  Filled 2023-01-31: qty 10

## 2023-01-31 MED ORDER — TRAZODONE HCL 50 MG PO TABS: 25.0000 mg | ORAL_TABLET | Freq: Every evening | ORAL | Status: DC | PRN

## 2023-01-31 MED ORDER — POTASSIUM CHLORIDE 10 MEQ/100ML IV SOLN
10.0000 meq | INTRAVENOUS | Status: AC
  Administered 2023-01-31 – 2023-02-01 (×3): 10 meq via INTRAVENOUS
  Filled 2023-01-31 (×3): qty 100

## 2023-01-31 MED ORDER — BISACODYL 10 MG RE SUPP: 10.0000 mg | Freq: Every day | RECTAL | Status: DC | PRN

## 2023-01-31 MED ORDER — GABAPENTIN 100 MG PO CAPS: 100.0000 mg | ORAL_CAPSULE | Freq: Every day | ORAL | Status: DC

## 2023-01-31 MED ORDER — ENOXAPARIN SODIUM 80 MG/0.8ML IJ SOSY
80.0000 mg | PREFILLED_SYRINGE | Freq: Two times a day (BID) | INTRAMUSCULAR | Status: DC
  Administered 2023-01-31 – 2023-02-01 (×2): 80 mg via SUBCUTANEOUS
  Filled 2023-01-31 (×4): qty 0.8

## 2023-01-31 NOTE — Progress Notes (Signed)
Pt seen, found on 2l [place], spo2 98-100%, no increased wob / sob noted.  Bipap in room on standby but not indicated at this time.

## 2023-01-31 NOTE — Progress Notes (Signed)
A consult was received from an ED physician for vancomycin per pharmacy dosing for pneumonia.  The patient's profile has been reviewed for ht/wt/allergies/indication/available labs.    A one time order has been placed for vancomycin 1750 mg IV.    Further antibiotics/pharmacy consults should be ordered by admitting physician if indicated.                        Thank you for allowing pharmacy to be a part of this patient's care.  Selinda Eon, PharmD, BCPS Clinical Pharmacist [place] 01/31/2023 2:49 PM

## 2023-01-31 NOTE — ED Notes (Signed)
ED TO INPATIENT HANDOFF REPORT  ED Nurse Name and Phone #: seyla  S Name/Age/Gender Val Eagle 42 y.o. male Room/Bed: RESA/RESA  Code Status   Code Status: Full Code  Home/SNF/Other Nursing Home Patient oriented to: self Is this baseline? Yes   Triage Complete: Triage complete  Chief Complaint Severe sepsis (HCC) [A41.9, R65.20]  Triage Note Pt BIB EMS from [facility]. Pt brought in due to respiratory distress Sp02 one arrival was 68%. With Bipap Sp02 was 78%. Pt is on Hospice. Pt follows commands, nonverbal, paraplegic. No G-tube feeding with syringe. Not DNR. Dark urine noted in urine bag.   BP 170/110 HR 160 Sp02 78%   Allergies No Known Allergies  Level of Care/Admitting Diagnosis ED Disposition     ED Disposition  Admit   Condition  --   Comment  Hospital Area: [facility] [[id number]]  Level of Care: Stepdown [14]  Admit to SDU based on following criteria: Severe physiological/psychological symptoms:  Any diagnosis requiring assessment & intervention at least every 4 hours on an ongoing basis to obtain desired patient outcomes including stability and rehabilitation  May admit patient to [facility] or [facility] if equivalent level of care is available:: No  Covid Evaluation: Asymptomatic - no recent exposure (last 10 days) testing not required  Diagnosis: Severe sepsis [facility]) [[id number]]  Admitting Physician: Maryln Gottron [[id number]]  Attending Physician: Kirby Crigler, MIR Jaxson.Roy [[id number]]  Certification:: I certify this patient will need inpatient services for at least 2 midnights  Expected Medical Readiness: 02/03/2023          B Medical/Surgery History Past Medical History:  Diagnosis Date   Depression    Diabetes mellitus without complication (HCC)    Eye abnormalities    unspecified by pt   Hypertension    Multiple sclerosis (HCC)    Weakness    generalized   Past Surgical History:  Procedure  Laterality Date   RADIOLOGY WITH ANESTHESIA N/A 11/11/2021   Procedure: MRI WITH ANESTHESIA BRAIN MRI WITH T SPINE AND CERVICAL SPINE W/O CONTRAST;  Surgeon: Radiologist, Medication, MD;  Location: [facility] OR;  Service: Radiology;  Laterality: N/A;   WISDOM TOOTH EXTRACTION       A IV Location/Drains/Wounds Patient Lines/Drains/Airways Status     Active Line/Drains/Airways     Name Placement date Placement time Site Days   Peripheral IV 01/31/23 20 G Posterior;Right Forearm 01/31/23  1516  Forearm  less than 1   Peripheral IV 01/31/23 20 G Anterior;Left Forearm 01/31/23  1517  Forearm  less than 1   Peripheral IV 01/31/23 20 G Left;Posterior Wrist 01/31/23  1518  Wrist  less than 1   External Urinary Catheter 11/11/21  2215  --  446            Intake/Output Last 24 hours No intake or output data in the 24 hours ending 01/31/23 1847  Labs/Imaging Results for orders placed or performed during the hospital encounter of 01/31/23 (from the past 48 hour(s))  Comprehensive metabolic panel     Status: Abnormal   Collection Time: 01/31/23  2:36 PM  Result Value Ref Range   Sodium 156 (H) 135 - 145 mmol/L   Potassium 3.3 (L) 3.5 - 5.1 mmol/L   Chloride 120 (H) 98 - 111 mmol/L   CO2 24 22 - 32 mmol/L   Glucose, Bld 180 (H) 70 - 99 mg/dL    Comment: Glucose reference range applies only to samples taken  after fasting for at least 8 hours.   BUN 23 (H) 6 - 20 mg/dL   Creatinine, Ser 1.61 (L) 0.61 - 1.24 mg/dL   Calcium 9.5 8.9 - 09.6 mg/dL   Total Protein 8.1 6.5 - 8.1 g/dL   Albumin 3.4 (L) 3.5 - 5.0 g/dL   AST 26 15 - 41 U/L   ALT 31 0 - 44 U/L   Alkaline Phosphatase 75 38 - 126 U/L   Total Bilirubin 0.8 0.3 - 1.2 mg/dL   GFR, Estimated >04 >54 mL/min    Comment: (NOTE) Calculated using the CKD-EPI Creatinine Equation (2021)    Anion gap 12 5 - 15    Comment: Performed at [facility], [address], [place], [place] [address]  CBC with Differential     Status:  Abnormal   Collection Time: 01/31/23  2:36 PM  Result Value Ref Range   WBC 10.9 (H) 4.0 - 10.5 K/uL   RBC 5.27 4.22 - 5.81 MIL/uL   Hemoglobin 14.5 13.0 - 17.0 g/dL   HCT 91.4 78.2 - 95.6 %   MCV 90.7 80.0 - 100.0 fL   MCH 27.5 26.0 - 34.0 pg   MCHC 30.3 30.0 - 36.0 g/dL   RDW 21.3 08.6 - 57.8 %   Platelets 358 150 - 400 K/uL   nRBC 0.0 0.0 - 0.2 %   Neutrophils Relative % 75 %   Neutro Abs 8.2 (H) 1.7 - 7.7 K/uL   Lymphocytes Relative 18 %   Lymphs Abs 2.0 0.7 - 4.0 K/uL   Monocytes Relative 5 %   Monocytes Absolute 0.6 0.1 - 1.0 K/uL   Eosinophils Relative 1 %   Eosinophils Absolute 0.1 0.0 - 0.5 K/uL   Basophils Relative 1 %   Basophils Absolute 0.1 0.0 - 0.1 K/uL   Immature Granulocytes 0 %   Abs Immature Granulocytes 0.04 0.00 - 0.07 K/uL    Comment: Performed at [facility], [address] [place], [place] [address]  Protime-INR     Status: Abnormal   Collection Time: 01/31/23  2:36 PM  Result Value Ref Range   Prothrombin Time 17.6 (H) 11.4 - 15.2 seconds   INR 1.4 (H) 0.8 - 1.2    Comment: (NOTE) INR goal varies based on device and disease states. Performed at [facility], [address] [place], [place] [address]   APTT     Status: None   Collection Time: 01/31/23  2:36 PM  Result Value Ref Range   aPTT 30 24 - 36 seconds    Comment: Performed at [facility], [address] [place], [place] [address]  Blood gas, venous (at [facility] and [facility])     Status: Abnormal   Collection Time: 01/31/23  2:36 PM  Result Value Ref Range   pH, Ven 7.49 (H) 7.25 - 7.43   pCO2, Ven 35 (L) 44 - 60 mmHg   pO2, Ven 38 32 - 45 mmHg   Bicarbonate 26.7 20.0 - 28.0 mmol/L   Acid-Base Excess 3.6 (H) 0.0 - 2.0 mmol/L   O2 Saturation 72.8 %   Patient temperature 37.0     Comment: Performed at [facility], [address] [place], [place] [address]  I-Stat Lactic Acid, ED     Status: None   Collection Time:  01/31/23  2:48 PM  Result Value Ref Range   Lactic Acid, Venous 1.9 0.5 - 1.9 mmol/L  Urinalysis, w/ Reflex  to Culture (Infection Suspected) -Urine, Catheterized     Status: Abnormal   Collection Time: 01/31/23  2:58 PM  Result Value Ref Range   Specimen Source URINE, CATHETERIZED    Color, Urine AMBER (A) YELLOW    Comment: BIOCHEMICALS MAY BE AFFECTED BY COLOR   APPearance CLOUDY (A) CLEAR   Specific Gravity, Urine 1.027 1.005 - 1.030   pH 5.0 5.0 - 8.0   Glucose, UA NEGATIVE NEGATIVE mg/dL   Hgb urine dipstick SMALL (A) NEGATIVE   Bilirubin Urine NEGATIVE NEGATIVE   Ketones, ur 5 (A) NEGATIVE mg/dL   Protein, ur 30 (A) NEGATIVE mg/dL   Nitrite NEGATIVE NEGATIVE   Leukocytes,Ua SMALL (A) NEGATIVE   RBC / HPF 0-5 0 - 5 RBC/hpf   WBC, UA 0-5 0 - 5 WBC/hpf    Comment:        Reflex urine culture not performed if WBC <=10, OR if Squamous epithelial cells >5. If Squamous epithelial cells >5 suggest recollection.    Bacteria, UA NONE SEEN NONE SEEN   Squamous Epithelial / HPF 0-5 0 - 5 /HPF    Comment: Performed at [facility], [address], [place], [place] [address]  Resp panel by RT-PCR (RSV, Flu A&B, Covid) Anterior Nasal Swab     Status: None   Collection Time: 01/31/23  3:36 PM   Specimen: Anterior Nasal Swab  Result Value Ref Range   SARS Coronavirus 2 by RT PCR NEGATIVE NEGATIVE    Comment: (NOTE) SARS-CoV-2 target nucleic acids are NOT DETECTED.  The SARS-CoV-2 RNA is generally detectable in upper respiratory specimens during the acute phase of infection. The lowest concentration of SARS-CoV-2 viral copies this assay can detect is 138 copies/mL. A negative result does not preclude SARS-Cov-2 infection and should not be used as the sole basis for treatment or other patient management decisions. A negative result may occur with  improper specimen collection/handling, submission of specimen other than nasopharyngeal swab, presence of viral  mutation(s) within the areas targeted by this assay, and inadequate number of viral copies(<138 copies/mL). A negative result must be combined with clinical observations, patient history, and epidemiological information. The expected result is Negative.  Fact Sheet for Patients:  BloggerCourse.com  Fact Sheet for Healthcare Providers:  SeriousBroker.it  This test is no t yet approved or cleared by the [place] FDA and  has been authorized for detection and/or diagnosis of SARS-CoV-2 by FDA under an Emergency Use Authorization (EUA). This EUA will remain  in effect (meaning this test can be used) for the duration of the COVID-19 declaration under Section 564(b)(1) of the Act, 21 U.S.C.section 360bbb-3(b)(1), unless the authorization is terminated  or revoked sooner.       Influenza A by PCR NEGATIVE NEGATIVE   Influenza B by PCR NEGATIVE NEGATIVE    Comment: (NOTE) The Xpert Xpress SARS-CoV-2/FLU/RSV plus assay is intended as an aid in the diagnosis of influenza from Nasopharyngeal swab specimens and should not be used as a sole basis for treatment. Nasal washings and aspirates are unacceptable for Xpert Xpress SARS-CoV-2/FLU/RSV testing.  Fact Sheet for Patients: BloggerCourse.com  Fact Sheet for Healthcare Providers: SeriousBroker.it  This test is not yet approved or cleared by the [place] FDA and has been authorized for detection and/or diagnosis of SARS-CoV-2 by FDA under an Emergency Use Authorization (EUA). This EUA will remain in effect (meaning this test can be used) for the duration of the COVID-19 declaration under Section 564(b)(1) of the Act,  21 U.S.C. section 360bbb-3(b)(1), unless the authorization is terminated or revoked.     Resp Syncytial Virus by PCR NEGATIVE NEGATIVE    Comment: (NOTE) Fact Sheet for  Patients: BloggerCourse.com  Fact Sheet for Healthcare Providers: SeriousBroker.it  This test is not yet approved or cleared by the [place] FDA and has been authorized for detection and/or diagnosis of SARS-CoV-2 by FDA under an Emergency Use Authorization (EUA). This EUA will remain in effect (meaning this test can be used) for the duration of the COVID-19 declaration under Section 564(b)(1) of the Act, 21 U.S.C. section 360bbb-3(b)(1), unless the authorization is terminated or revoked.  Performed at [facility], [address] [place], [place] [address]   I-Stat Lactic Acid, ED     Status: None   Collection Time: 01/31/23  4:54 PM  Result Value Ref Range   Lactic Acid, Venous 1.7 0.5 - 1.9 mmol/L   CT ABDOMEN PELVIS W CONTRAST  Result Date: 01/31/2023 CLINICAL DATA:  Sepsis abdomen pain hypoxic EXAM: CT ABDOMEN AND PELVIS WITH CONTRAST TECHNIQUE: Multidetector CT imaging of the abdomen and pelvis was performed using the standard protocol following bolus administration of intravenous contrast. RADIATION DOSE REDUCTION: This exam was performed according to the departmental dose-optimization program which includes automated exposure control, adjustment of the mA and/or kV according to patient size and/or use of iterative reconstruction technique. CONTRAST:  OMNIPAQUE IOHEXOL 350 MG/ML SOLN COMPARISON:  Chest x-ray 01/31/2023 FINDINGS: Lower chest: Lung bases demonstrate heterogeneous ground-glass density in the left lower lobe. Lower lobe pulmonary arterial filling defects are better seen on CTA chest. Hepatobiliary: No focal liver abnormality is seen. No gallstones, gallbladder wall thickening, or biliary dilatation. Pancreas: Unremarkable. No pancreatic ductal dilatation or surrounding inflammatory changes. Spleen: Normal in size without focal abnormality. Adrenals/Urinary Tract: Adrenal glands are normal.  Kidneys show no hydronephrosis. Thick-walled urinary bladder with catheter Stomach/Bowel: Stomach nonenlarged. No dilated small bowel. Fluid in the rectosigmoid colon with suspicion of mild wall thickening and mucosal enhancement. Vascular/Lymphatic: Mild aortic atherosclerosis. No aneurysm. No suspicious lymph nodes Reproductive: Negative prostate Other: Negative for pelvic effusion or free air Musculoskeletal: No acute or suspicious osseous abnormality IMPRESSION: 1. Fluid in the rectosigmoid colon with suspicion of mild wall thickening and mucosal enhancement, correlate for symptoms of proctocolitis. 2. Thick-walled urinary bladder with catheter in place. Correlate with urinalysis to exclude cystitis 3. Heterogeneous ground-glass density in the left lower lobe, question pneumonia. Lower lobe pulmonary arterial filling defects are better seen on CTA chest. Reference chest CTA report 4. Aortic atherosclerosis. Aortic Atherosclerosis (ICD10-I70.0). Electronically Signed   By: Jasmine Pang M.D.   On: 01/31/2023 17:22   CT Angio Chest PE W and/or Wo Contrast  Result Date: 01/31/2023 CLINICAL DATA:  Shortness of breath. EXAM: CT ANGIOGRAPHY CHEST WITH CONTRAST TECHNIQUE: Multidetector CT imaging of the chest was performed using the standard protocol during bolus administration of intravenous contrast. Multiplanar CT image reconstructions and MIPs were obtained to evaluate the vascular anatomy. RADIATION DOSE REDUCTION: This exam was performed according to the departmental dose-optimization program which includes automated exposure control, adjustment of the mA and/or kV according to patient size and/or use of iterative reconstruction technique. CONTRAST:  OMNIPAQUE IOHEXOL 350 MG/ML SOLN COMPARISON:  None Available. FINDINGS: Cardiovascular: Small filling defect is noted in lower lobe branch of left pulmonary artery consistent with pulmonary embolus. Normal cardiac size. No pericardial effusion.  Mediastinum/Nodes: No enlarged mediastinal, hilar, or axillary lymph nodes. Thyroid gland, trachea, and esophagus demonstrate  no significant findings. Lungs/Pleura: No pneumothorax or pleural effusion is noted. Aspirated material is noted in left lower lobe bronchi with mild patchy opacity seen in left lower lobe concerning for pneumonia. Smaller patchy opacities are noted in left upper and right lower lobes concerning for multifocal pneumonia. Upper Abdomen: No acute abnormality. Musculoskeletal: No chest wall abnormality. No acute or significant osseous findings. Review of the MIP images confirms the above findings. IMPRESSION: Small filling defect is noted in lower lobe branch of left pulmonary artery consistent with pulmonary embolus. Critical Value/emergent results were called by telephone at the time of interpretation on 01/31/2023 at 5:16 pm to provider DAVID YAO , who verbally acknowledged these results. Aspirated material is noted in left lower lobe bronchi with multiple patchy opacities in left lower lobe concerning for pneumonia. Smaller patchy opacities are also noted in left upper and right lower lobe concerning for multifocal pneumonia. Electronically Signed   By: Lupita Raider M.D.   On: 01/31/2023 17:17   CT HEAD WO CONTRAST ( )  Result Date: 01/31/2023 CLINICAL DATA:  Mental status change, unknown cause EXAM: CT HEAD WITHOUT CONTRAST TECHNIQUE: Contiguous axial images were obtained from the base of the skull through the vertex without intravenous contrast. RADIATION DOSE REDUCTION: This exam was performed according to the departmental dose-optimization program which includes automated exposure control, adjustment of the mA and/or kV according to patient size and/or use of iterative reconstruction technique. COMPARISON:  MRI 11/11/2021 FINDINGS: Brain: Extensive low-density areas throughout the deep white matter compatible with severe demyelinating disease, stable since prior MRI. No  hemorrhage, hydrocephalus or acute infarction. Vascular: No hyperdense vessel or unexpected calcification. Skull: No acute calvarial abnormality. Sinuses/Orbits: No acute findings Other: None IMPRESSION: Extensive patchy low-density throughout the deep white matter compatible with severe demyelinating disease, stable since recent MRI. Electronically Signed   By: Charlett Nose M.D.   On: 01/31/2023 17:13   DG Chest Port 1 View  Result Date: 01/31/2023 CLINICAL DATA:  Respiratory distress, sepsis EXAM: PORTABLE CHEST 1 VIEW COMPARISON:  11/07/2021 FINDINGS: The heart size and mediastinal contours are within normal limits. Both lungs are clear. The visualized skeletal structures are unremarkable. Artifact overlies the left upper lobe. IMPRESSION: No active disease. Electronically Signed   By: Judie Petit.  Shick M.D.   On: 01/31/2023 15:25    Pending Labs Unresulted Labs (From admission, onward)     Start     Ordered   02/01/23 0500  Basic metabolic panel  Tomorrow morning,   R        01/31/23 1828   02/01/23 0500  CBC  Tomorrow morning,   R        01/31/23 1828   01/31/23 1827  HIV Antibody (routine testing w rflx)  (HIV Antibody (Routine testing w reflex) panel)  Once,   R        01/31/23 1828   01/31/23 1822  Basic metabolic panel  Now then every 8 hours,   R (with STAT occurrences)      01/31/23 1821   01/31/23 1437  Blood Culture (routine x 2)  (Septic presentation on arrival (screening labs, nursing and treatment orders for obvious sepsis))  BLOOD CULTURE X 2,   STAT      01/31/23 1437            Vitals/Pain Today's Vitals   01/31/23 1616 01/31/23 1737 01/31/23 1740 01/31/23 1806  BP:  129/88  (!) 107/90  Pulse:  (!) 130  (!) 122  Resp:  Marland Kitchen)  26  (!) 21  Temp:    98.3 F (36.8 C)  TempSrc:    Oral  SpO2: 95% 100%  100%  Weight:   78.5 kg   Height:   5\' 10"  (1.778 m)     Isolation Precautions No active isolations  Medications Medications  vancomycin (VANCOREADY) IVPB 1750 mg/350  mL (1,750 mg Intravenous New Bag/Given 01/31/23 1657)  enoxaparin (LOVENOX) injection 80 mg (has no administration in time range)  0.9 %  sodium chloride infusion (has no administration in time range)  acetaminophen (TYLENOL) suppository 650 mg (has no administration in time range)  sertraline (ZOLOFT) tablet 100 mg (has no administration in time range)  bisacodyl (DULCOLAX) suppository 10 mg (has no administration in time range)  gabapentin (NEURONTIN) capsule 100 mg (has no administration in time range)  traZODone (DESYREL) tablet 25 mg (has no administration in time range)  ondansetron (ZOFRAN) tablet 4 mg (has no administration in time range)    Or  ondansetron (ZOFRAN) injection 4 mg (has no administration in time range)  albuterol (PROVENTIL) (2.5 MG/3ML) 0.083% nebulizer solution 2.5 mg (has no administration in time range)  cefTRIAXone (ROCEPHIN) 1 g in sodium chloride 0.9 % 100 mL IVPB (0 g Intravenous Stopped 01/31/23 1753)  azithromycin (ZITHROMAX) 500 mg in sodium chloride 0.9 % 250 mL IVPB (0 mg Intravenous Stopped 01/31/23 1753)  sodium chloride 0.9 % bolus 2,500 mL (2,500 mLs Intravenous New Bag/Given 01/31/23 1516)  iohexol (OMNIPAQUE) 350 MG/ML injection 100 mL (100 mLs Intravenous Contrast Given 01/31/23 1627)    Mobility non-ambulatory     Focused Assessments Pulmonary Assessment Handoff:  Lung sounds: Bilateral Breath Sounds: Diminished, Coarse crackles O2 Device: Bi-PAP      R Recommendations: See Admitting Provider Note  Report given to:   Additional Notes: n/a

## 2023-01-31 NOTE — ED Triage Notes (Signed)
Pt BIB EMS from [facility]. Pt brought in due to respiratory distress Sp02 one arrival was 68%. With Bipap Sp02 was 78%. Pt is on Hospice. Pt follows commands, nonverbal, paraplegic. No G-tube feeding with syringe. Not DNR. Dark urine noted in urine bag.   BP 170/110 HR 160 Sp02 78%

## 2023-01-31 NOTE — Consult Note (Addendum)
NAME:  [name], MRN:  [id number], DOB:  Mar 13, 1981, LOS: 0 ADMISSION DATE:  01/31/2023, CONSULTATION DATE:  01/31/2023  REFERRING MD:  Silverio Lay, EDP , CHIEF COMPLAINT:  resp distress   History of Present Illness:  42 year old man with advanced MS, BIBEMS from nursing facility due to respiratory distress.  EMS found him with a saturation of 68% on arrival, he was placed on BiPAP and transported to the ED. ED vitals were afebrile, blood pressure 183/118, tachycardic to 150s, decreased to 130s.  PCCM emergently consulted. Chest x-ray was clear VBG did not show hypercarbia. Labs significant for hyponatremia 156, hypokalemia 3.3, normal renal function, no leukocytosis, lactate of 1.9  At his baseline he is nonverbal, bedbound functional paraplegic, on dysphagia diet.  He is followed by hospice but is listed as a full code Head CT consistent with severe demyelinating disease, stable since recent MRI 10/2021  Pertinent  Medical History  Neurogenic bladder, chronic indwelling Foley Substance abuse-cannabis  Significant Hospital Events: Including procedures, antibiotic start and stop dates in addition to other pertinent events     Interim History / Subjective:  Coughing episodes on BiPAP  Objective   Blood pressure (!) 131/121, pulse (!) 131, temperature 99.9 F (37.7 C), temperature source Rectal, resp. rate (!) 29, SpO2 95%.    FiO2 (%):  [100 %] 100 %  No intake or output data in the 24 hours ending 01/31/23 1712 There were no vitals filed for this visit.  Examination: General: Thin, frail, chronically ill-appearing man on BiPAP, fullface mask, no distress HENT: Mild pallor, no icterus, no JVD, dry mucosa Lungs: Bilateral ventilated breath sounds, no accessory muscle use Cardiovascular: S1-S2 tacky, regular, no murmur Abdomen: Soft, nontender abdomen Extremities: No edema, no contractures Neuro: Eyes open, nonverbal, good cough but unable to expectorate, moves upper extremities,  flicker movement lowers, follows commands GU: Dark urine in Foley bag    Resolved Hospital Problem list     Assessment & Plan:  Acute hypoxic respiratory failure, likely related to aspiration into airway  -CT angiogram chest to my review shows patchy infiltrate left base, he has frequent bouts of coughing but is unable to expectorate -We took him off BiPAP and placed him on a nonrebreather and he is maintaining oxygen saturations with acceptable work of breathing, can monitor him on this and decrease FiO2 as tolerated -He may need aggressive tracheobronchial toilet with hypertonic saline nebs, chest vest and even nasotracheal suctioning as tolerated -Follow official results of CT angiogram chest and abdomen  Addendum -CT angiogram official results show small peripheral PE, this is unlikely to be the cause of his respiratory distress and hypoxia , would suggest anticoagulation since he is bedbound and at risk  Hypernatremia -indicates dehydration and D5 half-normal saline can be used. Hypokalemia should be repleted.  Okay for [facility] to admit to stepdown unit, please let us know should his condition worsen.  Reengage with goals of care discussion based on course, he is on [facility] hosp[facility] care  Best Practice (right click and "Reselect all SmartList Selections" daily)   Diet/type: dysphagia diet (see orders) DVT prophylaxis: LMWH GI prophylaxis: N/A Lines: N/A Foley:  Yes, and it is still needed Code Status:  full code Last date of multidisciplinary goals of care discussion [NA]  Labs   CBC: Recent Labs  Lab 01/31/23 1436  WBC 10.9*  NEUTROABS 8.2*  HGB 14.5  HCT 47.8  MCV 90.7  PLT 358    Basic Metabolic Panel: Recent Labs  Lab 01/31/23 1436  NA 156*  K 3.3*  CL 120*  CO2 24  GLUCOSE 180*  BUN 23*  CREATININE 0.56*  CALCIUM 9.5   GFR: CrCl cannot be calculated (Unknown ideal weight.). Recent Labs  Lab 01/31/23 1436 01/31/23 1448 01/31/23 1654  WBC 10.9*   --   --   LATICACIDVEN  --  1.9 1.7    Liver Function Tests: Recent Labs  Lab 01/31/23 1436  AST 26  ALT 31  ALKPHOS 75  BILITOT 0.8  PROT 8.1  ALBUMIN 3.4*   No results for input(s): "LIPASE", "AMYLASE" in the last 168 hours. No results for input(s): "AMMONIA" in the last 168 hours.  ABG    Component Value Date/Time   HCO3 26.7 01/31/2023 1436   TCO2 22 02/26/2011 1757   O2SAT 72.8 01/31/2023 1436     Coagulation Profile: Recent Labs  Lab 01/31/23 1436  INR 1.4*    Cardiac Enzymes: No results for input(s): "CKTOTAL", "CKMB", "CKMBINDEX", "TROPONINI" in the last 168 hours.  HbA1C: Hgb A1c MFr Bld  Date/Time Value Ref Range Status  11/08/2021 06:08 AM 4.9 4.8 - 5.6 % Final    Comment:    (NOTE) Pre diabetes:          5.7%-6.4%  Diabetes:              >6.4%  Glycemic control for   <7.0% adults with diabetes     CBG: No results for input(s): "GLUCAP" in the last 168 hours.  Review of Systems:   Unable to obtain  Past Medical History:  He,  has a past medical history of Depression, Diabetes mellitus without complication (HCC), Eye abnormalities, Hypertension, Multiple sclerosis (HCC), and Weakness.   Surgical History:   Past Surgical History:  Procedure Laterality Date   RADIOLOGY WITH ANESTHESIA N/A 11/11/2021   Procedure: MRI WITH ANESTHESIA BRAIN MRI WITH T SPINE AND CERVICAL SPINE W/O CONTRAST;  Surgeon: Radiologist, Medication, MD;  Location: [facility] OR;  Service: Radiology;  Laterality: N/A;   WISDOM TOOTH EXTRACTION       Social History:   reports that he has been smoking cigarettes. He has never used smokeless tobacco. He reports current alcohol use of about 12.0 standard drinks of alcohol per week. He reports current drug use. Frequency: 30.00 times per week. Drug: Marijuana.   Family History:  His family history includes Heart disease in his mother; Multiple sclerosis in his cousin; Other in his father. He was adopted.   Allergies No Known  Allergies   Home Medications  Prior to Admission medications   Medication Sig Start Date End Date Taking? Authorizing Provider  acetaminophen (TYLENOL) 650 MG suppository Place 650 mg rectally every 6 (six) hours as needed for fever (or pain). 01/20/23 02/19/23 Yes [provider]  DULCOLAX 10 MG suppository Place 10 mg rectally daily as needed (for constipation).   Yes [provider]  Emollient (EUCERIN INTENSIVE REPAIR EX) Apply 1 application  topically See admin instructions. Apply to face and body in the morning and at bedtime   Yes [provider]  gabapentin (NEURONTIN) 100 MG capsule Take 100 mg by mouth daily. 01/28/23 02/04/23 Yes [provider]  lidocaine 4 % Place 1 patch onto the skin See admin instructions. Apply 1 patch to the lower back and lower extremities once a day- remove as directed   Yes [provider]  polyethylene glycol powder (GLYCOLAX/MIRALAX) 17 GM/SCOOP powder Take 17 g by mouth daily.   Yes  [provider]  potassium chloride 20 MEQ/15ML (10%) SOLN Take 10 mEq by mouth daily.   Yes [provider]  sertraline (ZOLOFT) 20 MG/ML concentrated solution Take 100 mg by mouth daily.   Yes [provider]  acetaminophen (TYLENOL) 325 MG tablet Take 1-2 tablets (325-650 mg total) by mouth every 4 (four) hours as needed for mild pain. Patient not taking: Reported on 01/31/2023 06/07/18   Love, Evlyn Kanner, PA-C  pantoprazole (PROTONIX) 40 MG tablet Take 1 tablet (40 mg total) by mouth daily. Patient not taking: Reported on 01/31/2023 06/07/18   Love, Evlyn Kanner, PA-C      Cyril Mourning MD. FCCP. [facility] Pager : [phone]  If no response to pager , please call [phone] until 7 pm After 7:00 pm call Elink  [phone]   01/31/2023

## 2023-01-31 NOTE — ED Provider Notes (Signed)
Patient has MS.  He resides at a nursing home.  Patient has been having problems with aspiration.  Family has refused a PEG tube for him.  Patient has become short of breath and was significantly hypoxic by paramedics.  Physical exam patient lethargic and on BiPAP.  Patient has rales throughout his lungs.  Septic workup has been started and BiPAP has been continued   Bethann Berkshire, MD 01/31/23 1441

## 2023-01-31 NOTE — Progress Notes (Addendum)
RN notified RT that BiPAP is being removed to see how PT will do on non rebreather. RT followed up at 1708- found PT off BiPAP and on NRB (CCM at bedside). PT did not appear to be in respiratory distress at time of visit. BiPAP order will be changed to PRN - BiPAP unit will remain at bedside.

## 2023-01-31 NOTE — Progress Notes (Signed)
Pharmacy Antibiotic Note  [name] is a 42 y.o. male admitted on 01/31/2023 with acute hypoxic respiratory failure, likely related to aspiration into airway.  Pharmacy has been consulted for Unasyn dosing.  Plan: Unasyn 3 g IV every 6 hours Monitor clinical course, C&S as available, duration  Height: 5\' 10"  (177.8 cm) Weight: 78.5 kg (173 lb) IBW/kg (Calculated) : 73  Temp (24hrs), Avg:98.9 F (37.2 C), Min:98.3 F (36.8 C), Max:99.9 F (37.7 C)  Recent Labs  Lab 01/31/23 1436 01/31/23 1448 01/31/23 1654  WBC 10.9*  --   --   CREATININE 0.56*  --   --   LATICACIDVEN  --  1.9 1.7    Estimated Creatinine Clearance: 124.2 mL/min (A) (by C-G formula based on SCr of 0.56 mg/dL (L)).    No Known Allergies  Antimicrobials this admission: 10/2 azithromycin, Rocephin, Vancomycin x 1 10/2 Unasyn >>   Microbiology results: 10/2 BCx: sent   Thank you for allowing pharmacy to be a part of this patient's care.  Lynden Ang, PharmD, BCPS 01/31/2023 6:51 PM

## 2023-01-31 NOTE — Progress Notes (Signed)
RT NOTE:  Pt placed on BiPAP per EDP order. 

## 2023-01-31 NOTE — Progress Notes (Signed)
RT NOTE:  Pt transported to and from CT via BiPAP with no complications noted.  

## 2023-01-31 NOTE — ED Provider Notes (Signed)
[place] EMERGENCY DEPARTMENT AT [facility] Provider Note   CSN: [id number] Arrival date & time: 01/31/23  1431     History  Chief Complaint  Patient presents with   Respiratory Distress    [name] is a 42 y.o. male history of multiple sclerosis, bedbound, here presenting with respiratory distress.  Patient is from [facility].  Patient is bedbound at baseline and paraplegic.  Patient had recurrent aspiration pneumonia previously.  Patient has poor appetite.  Patient was noted to be hypoxic with oxygen of 68% on room air.  Patient is not normally on oxygen.  Patient was started on BiPAP by EMS.  Patient also has indwelling Foley with some dark urine.  HPI     Home Medications Prior to Admission medications   Medication Sig Start Date End Date Taking? Authorizing Provider  acetaminophen (TYLENOL) 325 MG tablet Take 1-2 tablets (325-650 mg total) by mouth every 4 (four) hours as needed for mild pain. Patient not taking: Reported on 11/07/2021 06/07/18   Love, Evlyn Kanner, PA-C  pantoprazole (PROTONIX) 40 MG tablet Take 1 tablet (40 mg total) by mouth daily. Patient not taking: Reported on 11/07/2021 06/07/18   Jacquelynn Cree, PA-C      Allergies    Patient has no known allergies.    Review of Systems   Review of Systems  Neurological:  Positive for weakness.  Psychiatric/Behavioral:  Positive for confusion.   All other systems reviewed and are negative.   Physical Exam Updated Vital Signs BP (!) 183/118 (BP Location: Right Arm)   Pulse (!) 157   Temp 98.6 F (37 C) (Axillary)   Resp (!) 37   SpO2 94%  Physical Exam Vitals and nursing note reviewed.  Constitutional:      Comments: Chronically ill-appearing  HENT:     Head: Normocephalic.     Nose: Nose normal.     Mouth/Throat:     Mouth: Mucous membranes are dry.  Eyes:     Extraocular Movements: Extraocular movements intact.     Pupils: Pupils are equal, round, and reactive to  light.  Cardiovascular:     Rate and Rhythm: Regular rhythm.     Comments: Tachycardic Pulmonary:     Comments: Diminished bilateral bases Abdominal:     General: Abdomen is flat.     Palpations: Abdomen is soft.  Musculoskeletal:     Cervical back: Normal range of motion.     Comments: No obvious sacral decub ulcer  Skin:    General: Skin is warm.     Capillary Refill: Capillary refill takes less than 2 seconds.  Neurological:     Comments: Patient is paraplegic at baseline.  Patient is awake and moving bilateral arms  Psychiatric:     Comments: Unable      ED Results / Procedures / Treatments   Labs (all labs ordered are listed, but only abnormal results are displayed) Labs Reviewed  COMPREHENSIVE METABOLIC PANEL - Abnormal; Notable for the following components:      Result Value   Sodium 156 (*)    Potassium 3.3 (*)    Chloride 120 (*)    Glucose, Bld 180 (*)    BUN 23 (*)    Creatinine, Ser 0.56 (*)    Albumin 3.4 (*)    All other components within normal limits  CBC WITH DIFFERENTIAL/PLATELET - Abnormal; Notable for the following components:   WBC 10.9 (*)    Neutro Abs  8.2 (*)    All other components within normal limits  PROTIME-INR - Abnormal; Notable for the following components:   Prothrombin Time 17.6 (*)    INR 1.4 (*)    All other components within normal limits  BLOOD GAS, VENOUS - Abnormal; Notable for the following components:   pH, Ven 7.49 (*)    pCO2, Ven 35 (*)    Acid-Base Excess 3.6 (*)    All other components within normal limits  RESP PANEL BY RT-PCR (RSV, FLU A&B, COVID)  RVPGX2  CULTURE, BLOOD (ROUTINE X 2)  CULTURE, BLOOD (ROUTINE X 2)  APTT  URINALYSIS, W/ REFLEX TO CULTURE (INFECTION SUSPECTED)  I-STAT CG4 LACTIC ACID, ED    EKG EKG Interpretation Date/Time:  Wednesday January 31 2023 14:40:52 EDT Ventricular Rate:  161 PR Interval:  119 QRS Duration:  63 QT Interval:  255 QTC Calculation: 418 R Axis:   83  Text  Interpretation: Sinus tachycardia Consider right atrial enlargement Repol abnrm, prob ischemia, inferolateral lds Since last tracing rate faster Confirmed by Richardean Canal [phone]) on 01/31/2023 3:04:56 PM  Radiology DG Chest Port 1 View  Result Date: 01/31/2023 CLINICAL DATA:  Respiratory distress, sepsis EXAM: PORTABLE CHEST 1 VIEW COMPARISON:  11/07/2021 FINDINGS: The heart size and mediastinal contours are within normal limits. Both lungs are clear. The visualized skeletal structures are unremarkable. Artifact overlies the left upper lobe. IMPRESSION: No active disease. Electronically Signed   By: Judie Petit.  Shick M.D.   On: 01/31/2023 15:25    Procedures Procedures    CRITICAL CARE Performed by: Richardean Canal   Total critical care time: 34 minutes  Critical care time was exclusive of separately billable procedures and treating other patients.  Critical care was necessary to treat or prevent imminent or life-threatening deterioration.  Critical care was time spent personally by me on the following activities: development of treatment plan with patient and/or surrogate as well as nursing, discussions with consultants, evaluation of patient's response to treatment, examination of patient, obtaining history from patient or surrogate, ordering and performing treatments and interventions, ordering and review of laboratory studies, ordering and review of radiographic studies, pulse oximetry and re-evaluation of patient's condition.   Medications Ordered in ED Medications  cefTRIAXone (ROCEPHIN) 1 g in sodium chloride 0.9 % 100 mL IVPB (1 g Intravenous New Bag/Given 01/31/23 1524)  azithromycin (ZITHROMAX) 500 mg in sodium chloride 0.9 % 250 mL IVPB (500 mg Intravenous New Bag/Given 01/31/23 1532)  vancomycin (VANCOREADY) IVPB 1750 mg/350 mL (has no administration in time range)  sodium chloride 0.9 % bolus 2,500 mL (2,500 mLs Intravenous New Bag/Given 01/31/23 1516)    ED Course/ Medical Decision  Making/ A&P                                 Medical Decision Making [name] is a 42 y.o. male here presenting with possible sepsis.  Patient is tachycardic and has low-grade temperature.  Patient is also hypoxic.  Consider PE versus pneumonia versus dehydration.  Plan to get CBC and CMP and lactate and cultures.  Will give broad-spectrum antibiotics.  Will also get VBG to assess respiratory status.  Will continue BiPAP.  Will hold off on intubation currently.  4 pm VBG is reassuring.  Patient is hyponatremic with sodium of 156.  This likely indicates dehydration.  I consulted Dr. Vassie Loll from critical care to see patient.  5:33 PM Dr.  Vassie Loll saw the patient.  He took patient off of BiPAP around 5 PM.  Patient is now down to 5 L nasal cannula.  Patient is breathing more comfortable now.  CT showed aspiration with possible food material in the left lower bronchi.  Patient also has small PE as well.  Patient was given broad-spectrum antibiotics.  Patient was also started on heparin.  At this point patient will be admitted by the hospitalist service.  Since patient does not have a primary neurologic issue, I discussed with Dr. Amada Jupiter from neurology.  He states that patient can stay at [facility] and if there is acute change of his neurologic status then hospitalist can consult neurology.   Problems Addressed: Hypernatremia: acute illness or injury Other acute pulmonary embolism without acute cor pulmonale (HCC): acute illness or injury  Amount and/or Complexity of Data Reviewed Labs: ordered. Decision-making details documented in ED Course. Radiology: ordered and independent interpretation performed. Decision-making details documented in ED Course.  Risk Prescription drug management. Decision regarding hospitalization.   Final Clinical Impression(s) / ED Diagnoses Final diagnoses:  None    Rx / DC Orders ED Discharge Orders     None         Charlynne Pander,  MD 01/31/23 1735

## 2023-01-31 NOTE — H&P (Signed)
History and Physical  HENRY UTSEY ZOX:[id number] DOB: 1980-08-13 DOA: 01/31/2023  PCP: Lonie Peak, PA-C   Chief Complaint: hypoxia   HPI: [name] is a 42 y.o. 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  Patient is a resident of [facility], he takes an oral diet but has a poor appetite, it is noted that his family has refused PEG tube placement.  At baseline he is on room air.  Apparently he was noted to be short of breath with O2 saturation documented 68% on room air per nursing home staff.  EMS was called, he was placed on BiPAP and brought to the emergency department.  ED Course: In the emergency department, the patient has been afebrile, he has been maintained on BiPAP initially, weaned to 4 L nasal cannula oxygen.  Heart rate 122, blood pressure initially 183/118 most recently 107/79.  Saturating 100% on 4 L nasal cannula.  Lab work was performed in the ER, CBC significant for WBC 10.9, CMP significant for sodium 156, potassium 3.3, chloride 120, creatinine 0.56, normal LFTs, lactate initially 1.9, repeat 1.7.  Patient had CT abdomen, chest and pelvis, as well as head CT without contrast.  Significant for subsegmental PE, evidence of aspiration and multifocal pneumonia.  He was given broad-spectrum IV antibiotics, and started on therapeutic Lovenox.  He was seen by pulmonary critical care, who recommended admission to the hospitalist service.  Review of Systems: Please see HPI for pertinent positives and negatives. A complete 10 system review of systems could not be performed due to the patient's condition  Past Medical History:  Diagnosis Date   Depression    Diabetes mellitus without complication (HCC)    Eye abnormalities    unspecified by pt   Hypertension    Multiple sclerosis (HCC)    Weakness    generalized   Past Surgical  History:  Procedure Laterality Date   RADIOLOGY WITH ANESTHESIA N/A 11/11/2021   Procedure: MRI WITH ANESTHESIA BRAIN MRI WITH T SPINE AND CERVICAL SPINE W/O CONTRAST;  Surgeon: Radiologist, Medication, MD;  Location: [facility] OR;  Service: Radiology;  Laterality: N/A;   WISDOM TOOTH EXTRACTION      Social History:  reports that he has been smoking cigarettes. He has never used smokeless tobacco. He reports current alcohol use of about 12.0 standard drinks of alcohol per week. He reports current drug use. Frequency: 30.00 times per week. Drug: Marijuana.   No Known Allergies  Family History  Adopted: Yes  Problem Relation Age of Onset   Multiple sclerosis Cousin    Heart disease Mother    Other Father        Does not know his father.     Prior to Admission medications   Medication Sig Start Date End Date Taking? Authorizing Provider  acetaminophen (TYLENOL) 650 MG suppository Place 650 mg rectally every 6 (six) hours as needed for fever (or pain). 01/20/23 02/19/23 Yes [provider]  DULCOLAX 10 MG suppository Place 10 mg rectally daily as needed (for constipation).   Yes [provider]  Emollient (EUCERIN INTENSIVE REPAIR EX) Apply 1 application  topically See admin instructions. Apply to face and body in the morning and at bedtime   Yes [provider]  gabapentin (NEURONTIN) 100 MG capsule Take 100 mg by mouth daily. 01/28/23 02/04/23 Yes [provider]  lidocaine 4 % Place 1  patch onto the skin See admin instructions. Apply 1 patch to the lower back and lower extremities once a day- remove as directed   Yes [provider]  polyethylene glycol powder (GLYCOLAX/MIRALAX) 17 GM/SCOOP powder Take 17 g by mouth daily.   Yes [provider]  potassium chloride 20 MEQ/15ML (10%) SOLN Take 10 mEq by mouth daily.   Yes [provider]  sertraline (ZOLOFT) 20 MG/ML concentrated solution Take 100 mg by mouth daily.   Yes [provider]  acetaminophen (TYLENOL) 325 MG tablet Take 1-2 tablets (325-650 mg total) by mouth every 4 (four) hours as needed for mild pain. Patient not taking: Reported on 01/31/2023 06/07/18   Love, Evlyn Kanner, PA-C  pantoprazole (PROTONIX) 40 MG tablet Take 1 tablet (40 mg total) by mouth daily. Patient not taking: Reported on 01/31/2023 06/07/18   Love, Evlyn Kanner, PA-C    Physical Exam: BP (!) 107/90 (BP Location: Right Arm)   Pulse (!) 122   Temp 98.3 F (36.8 C) (Oral)   Resp (!) 21   Ht 5\' 10"  (1.778 m)   Wt 78.5 kg   SpO2 100%   BMI 24.82 kg/m   General: Patient is alert but nonverbal, calm, in no acute distress, resting on nasal cannula does not look acutely ill.  He is able to indicate answers to some questions.  Denies any discomfort currently, seems to understand when I explained he is being admitted to the hospital for pneumonia and a blood clot in the lung. Cardiovascular: Regular and tachycardic, no murmurs or rubs, no peripheral edema  Respiratory: Globally diminished breath sounds on anterior exam, no wheezing, no crackles, no respiratory distress Abdomen: soft, nontender, nondistended, normal bowel tones heard  Skin: dry, no rashes  Musculoskeletal: no joint effusions,          Labs on Admission:  Basic Metabolic Panel: Recent Labs  Lab 01/31/23 1436  NA 156*  K 3.3*  CL 120*  CO2 24  GLUCOSE 180*  BUN 23*  CREATININE 0.56*  CALCIUM 9.5   Liver Function Tests: Recent Labs  Lab 01/31/23 1436  AST 26  ALT 31  ALKPHOS 75  BILITOT 0.8  PROT 8.1  ALBUMIN 3.4*   No results for input(s): "LIPASE", "AMYLASE" in the last 168 hours. No results for input(s): "AMMONIA" in the last 168 hours. CBC: Recent Labs  Lab 01/31/23 1436  WBC 10.9*  NEUTROABS 8.2*  HGB 14.5  HCT 47.8  MCV 90.7  PLT 358   Cardiac Enzymes: No results for input(s): "CKTOTAL", "CKMB", "CKMBINDEX", "TROPONINI" in the last 168 hours.  BNP (last 3 results) No results for input(s):  "BNP" in the last 8760 hours.  ProBNP (last 3 results) No results for input(s): "PROBNP" in the last 8760 hours.  CBG: No results for input(s): "GLUCAP" in the last 168 hours.  Radiological Exams on Admission: CT ABDOMEN PELVIS W CONTRAST  Result Date: 01/31/2023 CLINICAL DATA:  Sepsis abdomen pain hypoxic EXAM: CT ABDOMEN AND PELVIS WITH CONTRAST TECHNIQUE: Multidetector CT imaging of the abdomen and pelvis was performed using the standard protocol following bolus administration of intravenous contrast. RADIATION DOSE REDUCTION: This exam was performed according to the departmental dose-optimization program which includes automated exposure control, adjustment of the mA and/or kV according to patient size and/or use of iterative reconstruction technique. CONTRAST:  OMNIPAQUE IOHEXOL 350 MG/ML SOLN COMPARISON:  Chest x-ray 01/31/2023 FINDINGS: Lower chest: Lung bases demonstrate heterogeneous ground-glass density in the left lower lobe.  Lower lobe pulmonary arterial filling defects are better seen on CTA chest. Hepatobiliary: No focal liver abnormality is seen. No gallstones, gallbladder wall thickening, or biliary dilatation. Pancreas: Unremarkable. No pancreatic ductal dilatation or surrounding inflammatory changes. Spleen: Normal in size without focal abnormality. Adrenals/Urinary Tract: Adrenal glands are normal. Kidneys show no hydronephrosis. Thick-walled urinary bladder with catheter Stomach/Bowel: Stomach nonenlarged. No dilated small bowel. Fluid in the rectosigmoid colon with suspicion of mild wall thickening and mucosal enhancement. Vascular/Lymphatic: Mild aortic atherosclerosis. No aneurysm. No suspicious lymph nodes Reproductive: Negative prostate Other: Negative for pelvic effusion or free air Musculoskeletal: No acute or suspicious osseous abnormality IMPRESSION: 1. Fluid in the rectosigmoid colon with suspicion of mild wall thickening and mucosal enhancement, correlate for symptoms  of proctocolitis. 2. Thick-walled urinary bladder with catheter in place. Correlate with urinalysis to exclude cystitis 3. Heterogeneous ground-glass density in the left lower lobe, question pneumonia. Lower lobe pulmonary arterial filling defects are better seen on CTA chest. Reference chest CTA report 4. Aortic atherosclerosis. Aortic Atherosclerosis (ICD10-I70.0). Electronically Signed   By: Jasmine Pang M.D.   On: 01/31/2023 17:22   CT Angio Chest PE W and/or Wo Contrast  Result Date: 01/31/2023 CLINICAL DATA:  Shortness of breath. EXAM: CT ANGIOGRAPHY CHEST WITH CONTRAST TECHNIQUE: Multidetector CT imaging of the chest was performed using the standard protocol during bolus administration of intravenous contrast. Multiplanar CT image reconstructions and MIPs were obtained to evaluate the vascular anatomy. RADIATION DOSE REDUCTION: This exam was performed according to the departmental dose-optimization program which includes automated exposure control, adjustment of the mA and/or kV according to patient size and/or use of iterative reconstruction technique. CONTRAST:  OMNIPAQUE IOHEXOL 350 MG/ML SOLN COMPARISON:  None Available. FINDINGS: Cardiovascular: Small filling defect is noted in lower lobe branch of left pulmonary artery consistent with pulmonary embolus. Normal cardiac size. No pericardial effusion. Mediastinum/Nodes: No enlarged mediastinal, hilar, or axillary lymph nodes. Thyroid gland, trachea, and esophagus demonstrate no significant findings. Lungs/Pleura: No pneumothorax or pleural effusion is noted. Aspirated material is noted in left lower lobe bronchi with mild patchy opacity seen in left lower lobe concerning for pneumonia. Smaller patchy opacities are noted in left upper and right lower lobes concerning for multifocal pneumonia. Upper Abdomen: No acute abnormality. Musculoskeletal: No chest wall abnormality. No acute or significant osseous findings. Review of the MIP images confirms  the above findings. IMPRESSION: Small filling defect is noted in lower lobe branch of left pulmonary artery consistent with pulmonary embolus. Critical Value/emergent results were called by telephone at the time of interpretation on 01/31/2023 at 5:16 pm to provider DAVID YAO , who verbally acknowledged these results. Aspirated material is noted in left lower lobe bronchi with multiple patchy opacities in left lower lobe concerning for pneumonia. Smaller patchy opacities are also noted in left upper and right lower lobe concerning for multifocal pneumonia. Electronically Signed   By: Lupita Raider M.D.   On: 01/31/2023 17:17   CT HEAD WO CONTRAST ( )  Result Date: 01/31/2023 CLINICAL DATA:  Mental status change, unknown cause EXAM: CT HEAD WITHOUT CONTRAST TECHNIQUE: Contiguous axial images were obtained from the base of the skull through the vertex without intravenous contrast. RADIATION DOSE REDUCTION: This exam was performed according to the departmental dose-optimization program which includes automated exposure control, adjustment of the mA and/or kV according to patient size and/or use of iterative reconstruction technique. COMPARISON:  MRI 11/11/2021 FINDINGS: Brain: Extensive low-density areas throughout the deep white matter compatible with severe demyelinating  disease, stable since prior MRI. No hemorrhage, hydrocephalus or acute infarction. Vascular: No hyperdense vessel or unexpected calcification. Skull: No acute calvarial abnormality. Sinuses/Orbits: No acute findings Other: None IMPRESSION: Extensive patchy low-density throughout the deep white matter compatible with severe demyelinating disease, stable since recent MRI. Electronically Signed   By: Charlett Nose M.D.   On: 01/31/2023 17:13   DG Chest Port 1 View  Result Date: 01/31/2023 CLINICAL DATA:  Respiratory distress, sepsis EXAM: PORTABLE CHEST 1 VIEW COMPARISON:  11/07/2021 FINDINGS: The heart size and mediastinal contours are within  normal limits. Both lungs are clear. The visualized skeletal structures are unremarkable. Artifact overlies the left upper lobe. IMPRESSION: No active disease. Electronically Signed   By: Judie Petit.  Shick M.D.   On: 01/31/2023 15:25    Assessment/Plan [name] is a 43 y.o. 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  He was initially seen by pulmonary critical care, who recommends hospital admission and is available for further assistance if the patient decompensates.  Sepsis-meeting criteria with tachycardia, leukocytosis, significant hypotension.  Source is suspected aspiration pneumonia.  Patient is hemodynamically stable, though hypotensive.  Lactate normal.  Urinalysis without evidence of UTI. -Inpatient admission to stepdown -Empiric IV Unasyn -IV fluids -Follow-up blood cultures  Acute hypoxic respiratory failure-due to aspiration pneumonia as above.  Hypernatremia-likely due to significant dehydration from lack of appetite and difficulty with oral intake. -Continue IV normal saline infusion -Check BMP now and every 8 hours  Pulmonary embolus-subsegmental, likely not causing significant hypotension or hypoxia.  Likely as a result of his immobilization. -Therapeutic Lovenox -2D echo to rule out heart strain in the setting of tachycardia and mild hypotension, though not highly suspected  Severe MS-with significant debility, wheelchair-bound.  He has ongoing known speech difficulty, and dysphagia.  Noted that facility staff assist him with eating to prevent aspiration.  He has chronic hip pain. -Continue gabapentin -SLP consultation -Palliative care consultation, to discuss symptom management and plans of care  Hypokalemia-will replete with 40 mill equivalents potassium chloride IV  DVT prophylaxis: Lovenox     Code Status: Full Code  Consults called:  PCCM  Admission status: The appropriate patient status for this patient is INPATIENT. Inpatient status is judged to be reasonable and necessary in order to provide the required intensity of service to ensure the patient's safety. The patient's presenting symptoms, physical exam findings, and initial radiographic and laboratory data in the context of their chronic comorbidities is felt to place them at high risk for further clinical deterioration. Furthermore, it is not anticipated that the patient will be medically stable for discharge from the hospital within 2 midnights of admission.    I certify that at the point of admission it is my clinical judgment that the patient will require inpatient hospital care spanning beyond 2 midnights from the point of admission due to high intensity of service, high risk for further deterioration and high frequency of surveillance required   Time spent: 65 minutes  Lloyd Ayo Sharlette Dense MD [facility] Pager [phone]  If 7PM-7AM, please contact night-coverage [email or web address] Password [facility]  01/31/2023, 6:28 PM

## 2023-02-01 ENCOUNTER — Other Ambulatory Visit: Payer: Self-pay

## 2023-02-01 DIAGNOSIS — E870 Hyperosmolality and hypernatremia: Secondary | ICD-10-CM | POA: Diagnosis not present

## 2023-02-01 DIAGNOSIS — E876 Hypokalemia: Secondary | ICD-10-CM

## 2023-02-01 DIAGNOSIS — J9601 Acute respiratory failure with hypoxia: Secondary | ICD-10-CM | POA: Diagnosis not present

## 2023-02-01 DIAGNOSIS — A419 Sepsis, unspecified organism: Secondary | ICD-10-CM | POA: Diagnosis not present

## 2023-02-01 DIAGNOSIS — Z86711 Personal history of pulmonary embolism: Secondary | ICD-10-CM | POA: Insufficient documentation

## 2023-02-01 DIAGNOSIS — G35 Multiple sclerosis: Secondary | ICD-10-CM

## 2023-02-01 DIAGNOSIS — R1310 Dysphagia, unspecified: Secondary | ICD-10-CM

## 2023-02-01 DIAGNOSIS — I2693 Single subsegmental pulmonary embolism without acute cor pulmonale: Secondary | ICD-10-CM

## 2023-02-01 DIAGNOSIS — I10 Essential (primary) hypertension: Secondary | ICD-10-CM

## 2023-02-01 HISTORY — DX: Acute respiratory failure with hypoxia: J96.01

## 2023-02-01 LAB — BASIC METABOLIC PANEL
Anion gap: 12 (ref 5–15)
Anion gap: 13 (ref 5–15)
BUN: 16 mg/dL (ref 6–20)
BUN: 15 mg/dL (ref 6–20)
CO2: 22 mmol/L (ref 22–32)
CO2: 21 mmol/L — ABNORMAL LOW (ref 22–32)
Calcium: 8.2 mg/dL — ABNORMAL LOW (ref 8.9–10.3)
Calcium: 8.4 mg/dL — ABNORMAL LOW (ref 8.9–10.3)
Chloride: 123 mmol/L — ABNORMAL HIGH (ref 98–111)
Chloride: 121 mmol/L — ABNORMAL HIGH (ref 98–111)
Creatinine, Ser: 0.62 mg/dL (ref 0.61–1.24)
Creatinine, Ser: 0.53 mg/dL — ABNORMAL LOW (ref 0.61–1.24)
GFR, Estimated: >60 mL/min (ref >60)
GFR, Estimated: >60 mL/min (ref >60)
Glucose, Bld: 106 mg/dL — ABNORMAL HIGH (ref 70–99)
Glucose, Bld: 117 mg/dL — ABNORMAL HIGH (ref 70–99)
Potassium: 3.5 mmol/L (ref 3.5–5.1)
Potassium: 3.2 mmol/L — ABNORMAL LOW (ref 3.5–5.1)
Sodium: 157 mmol/L — ABNORMAL HIGH (ref 135–145)
Sodium: 155 mmol/L — ABNORMAL HIGH (ref 135–145)

## 2023-02-01 LAB — CBC
HCT: 41.5 % (ref 39.0–52.0)
Hemoglobin: 12.5 g/dL — ABNORMAL LOW (ref 13.0–17.0)
MCH: 28.5 pg (ref 26.0–34.0)
MCHC: 30.1 g/dL (ref 30.0–36.0)
MCV: 94.7 fL (ref 80.0–100.0)
Platelets: 233 10*3/uL (ref 150–400)
RBC: 4.38 MIL/uL (ref 4.22–5.81)
RDW: 13.6 % (ref 11.5–15.5)
WBC: 14 10*3/uL — ABNORMAL HIGH (ref 4.0–10.5)
nRBC: 0 % (ref 0.0–0.2)

## 2023-02-01 LAB — HIV ANTIBODY (ROUTINE TESTING W REFLEX): HIV Screen 4th Generation wRfx: NONREACTIVE

## 2023-02-01 MED ORDER — ONDANSETRON HCL 4 MG PO TABS: 4.0000 mg | ORAL_TABLET | Freq: Four times a day (QID) | ORAL | Status: DC | PRN

## 2023-02-01 MED ORDER — ENOXAPARIN SODIUM 80 MG/0.8ML IJ SOSY
80.0000 mg | PREFILLED_SYRINGE | Freq: Two times a day (BID) | INTRAMUSCULAR | Status: DC
  Administered 2023-02-03 – 2023-02-04 (×3): 80 mg via SUBCUTANEOUS
  Filled 2023-02-01 (×3): qty 0.8

## 2023-02-01 MED ORDER — POTASSIUM CHLORIDE 10 MEQ/100ML IV SOLN
INTRAVENOUS | Status: AC
  Administered 2023-02-01: 10 meq via INTRAVENOUS
  Filled 2023-02-01: qty 100

## 2023-02-01 MED ORDER — DEXTROSE 5 % IV SOLN: INTRAVENOUS | Status: DC

## 2023-02-01 MED ORDER — TRAZODONE HCL 50 MG PO TABS: 25.0000 mg | ORAL_TABLET | Freq: Every evening | ORAL | Status: DC | PRN

## 2023-02-01 MED ORDER — POTASSIUM CL IN DEXTROSE 5% 20 MEQ/L IV SOLN
20.0000 meq | INTRAVENOUS | Status: DC
  Administered 2023-02-01 (×2): 20 meq via INTRAVENOUS
  Filled 2023-02-01 (×4): qty 1000

## 2023-02-01 MED ORDER — ONDANSETRON HCL 4 MG/2ML IJ SOLN: 4.0000 mg | Freq: Four times a day (QID) | INTRAMUSCULAR | Status: DC | PRN

## 2023-02-01 MED ORDER — HYDRALAZINE HCL 20 MG/ML IJ SOLN: 5.0000 mg | Freq: Three times a day (TID) | INTRAMUSCULAR | Status: DC | PRN

## 2023-02-01 MED ORDER — SERTRALINE HCL 100 MG PO TABS
100.0000 mg | ORAL_TABLET | Freq: Every day | ORAL | Status: DC
  Administered 2023-02-03 – 2023-02-05 (×3): 100 mg
  Filled 2023-02-01 (×4): qty 1

## 2023-02-01 MED ORDER — GABAPENTIN 250 MG/5ML PO SOLN
100.0000 mg | Freq: Every day | ORAL | Status: DC
  Administered 2023-02-03 – 2023-02-05 (×3): 100 mg
  Filled 2023-02-01 (×5): qty 2

## 2023-02-01 NOTE — Progress Notes (Signed)
      Consult received. Chart reviewed. Patient is followed by [facility] hospice, with a terminal diagnosis of severe protein calorie nutrition in the setting of relapsing MS. He is admitted with subsegmental PE and bilateral aspiration pneumonia.   Discussed with Dr. Maryfrances Bunnell, who states no need to see patient at this time. I agree that circumstances indicate patient's brother should be considered the surrogate decision maker .   PMT will sign off. Please re-consult if needed.     Sherlean Foot, NP-C Palliative Medicine    No charge

## 2023-02-01 NOTE — Assessment & Plan Note (Addendum)
Potassium lower -Continue supplement K

## 2023-02-01 NOTE — Progress Notes (Signed)
Patient made comfortable in room 1604. He is able to gesture, nod, and point with left hand to communicate and respond.  The soft-touch call bell is not available at this time.  The room call bell was placed in his left hand and he demonstrates ability to use it.  He agrees to a wet mouth swab for his dry mouth.

## 2023-02-01 NOTE — Evaluation (Signed)
Clinical/Bedside Swallow Evaluation Patient Details  Name: [name] MRN: [id number] Date of Birth: [birth year]  Today's Date: 02/01/2023 Time: SLP Start Time (ACUTE ONLY): 4098 SLP Stop Time (ACUTE ONLY): 0953 SLP Time Calculation (min) (ACUTE ONLY): 16 min  Past Medical History:  Past Medical History:  Diagnosis Date   Depression    Diabetes mellitus without complication (HCC)    Eye abnormalities    unspecified by pt   Hypertension    Multiple sclerosis (HCC)    Weakness    generalized   Past Surgical History:  Past Surgical History:  Procedure Laterality Date   RADIOLOGY WITH ANESTHESIA N/A 11/11/2021   Procedure: MRI WITH ANESTHESIA BRAIN MRI WITH T SPINE AND CERVICAL SPINE W/O CONTRAST;  Surgeon: Radiologist, Medication, MD;  Location: [facility] OR;  Service: Radiology;  Laterality: N/A;   WISDOM TOOTH EXTRACTION     HPI:  Pt is a 42 yo male admitted 10/2 from SNF due to respiratory distress requiring BiPAP. CT showed aspiration with possible food material in the left lower bronchi and small PE. Per chart, at baseline he is nonverbal, bed bound functional paraplegic, on dysphagia diet (family has declined PEG), and followed by hospice but full code. Pt was last seen by SLP team in July 2023, at which time regular solids and thin liquids were recommended with assistance to avoid consecutive sips. Per neurologist note in May 2024, "he aspirates from his own saliva and food. Currently on a puree diet." Also on honey thick liquids per documentation from SNF.   PMH includes: advanced MS, recurrent aspiration PNA, neurogenic bladder with chronic indwelling foley, polysubstance abuse    Assessment / Plan / Recommendation  Clinical Impression  Pt was seen for swallow evaluation with concern for possible decline in function. Note that he does have dysphagia at baseline, although pt seems to have reduced awareness of this. He shakes his head "no" to having dysphagia, but nods "yes" to being on  modified solids/liquids. Pt has significantly reduced labial and lingual ROM with almost no lingual elevation. His tongue is observed to be coated during oral care but pt denies any pain. He leans to the L with reduced head control. This posture, combined with his labial weakness, results in anterior loss of ice chips and water as they melt. What remains in his mouth is ultimately suctioned back out. With a thicker viscosity (tested with his baseline honey thick liquids), he has at least enough lingual control to propel the bolus posteriorly, but there is still no hyolaryngeal movement noted to suggest that he has swallowed (although pt says he has). He does not swallow or cough to command, and delayed spontaneous coughing sounds weak (not productive), and wet. His baseline level of function is not entirely clear, but if he has been swallowing at all, then this could reflect a more acute change: potentially progression of his chronic dysphagia vs more acute decline given infection. For now, will leave NPO order in place and would focus on frequent, thorough oral care. Discussed with RN and reached out to MD and hospice liaison as further clarification of overall GOC may need to be addressed particularly if pt does not start to swallow.  SLP Visit Diagnosis: Dysphagia, unspecified (R13.10)    Aspiration Risk  Severe aspiration risk;Risk for inadequate nutrition/hydration    Diet Recommendation NPO (pending clarification of overall GOC)    Medication Administration: Via alternative means    Other  Recommendations Oral Care Recommendations: Oral care QID Caregiver Recommendations:  Have oral suction available    Recommendations for follow up therapy are one component of a multi-disciplinary discharge planning process, led by the attending physician.  Recommendations may be updated based on patient status, additional functional criteria and insurance authorization.  Follow up Recommendations  (tba)       Assistance Recommended at Discharge    Functional Status Assessment Patient has had a recent decline in their functional status and/or demonstrates limited ability to make significant improvements in function in a reasonable and predictable amount of time  Frequency and Duration min 2x/week  2 weeks       Prognosis Prognosis for improved oropharyngeal function: Guarded Barriers to Reach Goals: Time post onset;Severity of deficits      Swallow Study   General HPI: Pt is a 42 yo male admitted 10/2 from SNF due to respiratory distress requiring BiPAP. CT showed aspiration with possible food material in the left lower bronchi and small PE. Per chart, at baseline he is nonverbal, bed bound functional paraplegic, on dysphagia diet (family has declined PEG), and followed by hospice but full code. Pt was last seen by SLP team in July 2023, at which time regular solids and thin liquids were recommended with assistance to avoid consecutive sips. Per neurologist note in May 2024, "he aspirates from his own saliva and food. Currently on a puree diet." Also on honey thick liquids per documentation from SNF.   PMH includes: advanced MS, recurrent aspiration PNA, neurogenic bladder with chronic indwelling foley, polysubstance abuse Type of Study: Bedside Swallow Evaluation Previous Swallow Assessment: see HPI Diet Prior to this Study: NPO Temperature Spikes Noted: No Respiratory Status: Room air History of Recent Intubation: No Behavior/Cognition: Alert;Cooperative;Requires cueing Oral Cavity Assessment: Dried secretions;Excessive secretions;Other (comment) (coating on tongue) Oral Care Completed by SLP: Yes Oral Cavity - Dentition: Missing dentition Vision: Functional for self-feeding Self-Feeding Abilities: Total assist Patient Positioning: Postural control interferes with function Baseline Vocal Quality: Not observed Volitional Cough: Cognitively unable to elicit Volitional Swallow: Unable to  elicit    Oral/Motor/Sensory Function Overall Oral Motor/Sensory Function:  (reduced lingual and labial strength/ROM, almost not elevation of tongue tip)   Ice Chips Ice chips: Impaired Presentation: Spoon Oral Phase Impairments: Reduced labial seal;Reduced lingual movement/coordination;Poor awareness of bolus Oral Phase Functional Implications: Left anterior spillage;Left lateral sulci pocketing Pharyngeal Phase Impairments: Unable to trigger swallow   Thin Liquid Thin Liquid: Not tested    Nectar Thick Nectar Thick Liquid: Not tested   Honey Thick Honey Thick Liquid: Impaired Presentation: Spoon Oral Phase Impairments: Reduced labial seal;Reduced lingual movement/coordination Oral Phase Functional Implications: Left lateral sulci pocketing Pharyngeal Phase Impairments: Unable to trigger swallow;Cough - Delayed   Puree Puree: Not tested   Solid     Solid: Not tested      Mahala Menghini., M.A. CCC-SLP Acute Rehabilitation Services Office [phone]  Secure chat preferred  02/01/2023,10:28 AM

## 2023-02-01 NOTE — Hospital Course (Signed)
[name] is a 42 y.o. M with hx relapsing remitting MS, not currently on disease specific therapy, wheelchair bound and nonverbal at baseline, lives at [facility], also HTN who presented with apparent respiratory distress.  SpO2 68% with EMS, tachypneic to 30s in ER, placed on BiPAP, found to have subsegmental PE and bilateral aspiration pneumonia.

## 2023-02-01 NOTE — Assessment & Plan Note (Signed)
Advanced disability.  Nonverbal, wheelchair bound.  Follows with Dr. Malvin Johns in [place].  Not on disease specific therapy at present.

## 2023-02-01 NOTE — Progress Notes (Signed)
Progress Note   Patient: [name] UJW:[id number] DOB: 1981/02/15 DOA: 01/31/2023     1 DOS: the patient was seen and examined on 02/01/2023 at 8:32 AM      Brief hospital course: [name] is a 42 y.o. M with hx relapsing remitting MS, not currently on disease specific therapy, wheelchair bound and nonverbal at baseline, lives at [facility], also HTN who presented with apparent respiratory distress.  SpO2 68% with EMS, tachypneic to 30s in ER, placed on BiPAP, found to have subsegmental PE and bilateral aspiration pneumonia.       Assessment and Plan: * Severe sepsis due to aspiration pneumonia (HCC) Presented with tachycardia, tachypnea, leukocytosis, and respiratory failure in the setting of pneumonia. -Continue Unasyn day 2 of 5 - Follow blood cultures    Dysphagia Due to MS.    SLP consulted, feel that his swallowing is significantly worse than prior.  Discussed with brother by phone, clarified that PEG tube would not clearly reduce aspirations, but would allow for around administration of fluids and nutrition at goal rate to prevent dehydration or malnourishment. -Place core track now and start tube feeds - Consult IR for PEG tube placement  Hypernatremia Due to inadequate oral intake due to dysphagia due to MS. - Switch to hypotonic fluids to correct at a rate 1/2 mmol/L/hr.  Hypokalemia - Supplement K  Single subsegmental pulmonary embolism without acute cor pulmonale (HCC) Isolated small PE.  Likely asymptomatic. - Cotinue tx dose Lovenox  Acute respiratory failure with hypoxia (HCC) Presented with tachypnea to the 30s, dyspnea and requiring BiPAP.  Due to pneumonia.  Improving now - Chest PT  Hypertension BP elevated, not on meds at home - Start PRN hydralazine for severe range pressure  Multiple sclerosis (HCC) Advanced disability.  Nonverbal, wheelchair bound.  Follows with Dr. Malvin Johns in [place].  Not on disease specific therapy at  present.   Medical decision making/Goals of care Attempted to reach the patient's wife, no answer.  Attempted to reach the patient's daughter, phone disconnected.  Discussed with patient's brother and sister-in-law, who reported that the patient is estranged from his wife, had previously stated that he did not want her to make decisions for him, and that she is not involved in his life for care for many years.  Also confirmed that the patient's daughter likewise has not changed her phone number, and has not contacted anyone from the family in many months, and does not appear involved in decision-making.  This was corroborated with representative from [facility] who stated that they had had no contact with family during that time.   Reasonable efforts have been made by me and by staff to reach wife and daughter wtihout effect, and circumstances clearly indicate that the brother is the only active family member assisting with the care of the patient and is acting in his best interest and should be considered his surrogate decision maker.        Subjective: Patient is nonverbal.  However nursing of no concerns.  No respiratory distress anymore, no fever, no agitation.     Physical Exam: BP (!) 167/73   Pulse 78   Temp 97.7 F (36.5 C) (Oral)   Resp 18   Ht 5\' 10"  (1.778 m)   Wt 78.5 kg   SpO2 99%   BMI 24.82 kg/m   Frail chronically ill-appearing adult male, lying in bed, contractures in all 4 extremities due to multiple sclerosis, nonverbal Tachycardic, regular, no murmurs, no  peripheral edema Respiratory rate seems normal, lung sounds diminished overall, coarse crackles in bilateral bases Abdomen without grimace to palpation Attention seems normal, squeezes my hand appropriately responds to questions, nonverbal, contractures of all 4 extremities    Data Reviewed: This metabolic panel shows sodium 157, no change, creatinine okay CBC shows leukocytosis, normal lactate CT head  shows extensive patchy white matter disease, no change from previous CT of the abdomen and pelvis and CT angiogram shows left lower lobe subsegmental PE, bilateral multifocal pneumonia, and aspirated material in the bronchi, as well as chronic bowel and bladder thickening  Family Communication: Brother by phone    Disposition: Status is: Inpatient         Author: Alberteen Sam, MD 02/01/2023 12:47 PM  For on call review [email or web address].

## 2023-02-01 NOTE — Assessment & Plan Note (Signed)
Due to MS.    SLP consulted, feel that his swallowing is significantly worse than prior.  Discussed with brother by phone, clarified that PEG tube would not clearly reduce aspirations, but would allow for around administration of fluids and nutrition at goal rate to prevent dehydration or malnourishment. -Place core track now and start tube feeds - Consult IR for PEG tube placement

## 2023-02-01 NOTE — Consult Note (Signed)
[name] 01/09/1981  [id number].    Requesting MD: Henry Bunnell, MD Chief Complaint/Reason for Consult: dysphagia, gastrostomy tube placement  HPI:  [name] is a 42 y/o M with a history of multiple sclerosis, HTN, and dysphagia who presented  from [facility] 10/2 with SOB and respiratory distress. He was diagnosed with bilateral aspiration pneumonia and a subsegmental PE and admitted for further care. SLP evaluated him and feel he is a severe aspiration risk. Surgery is consulted to consider gastrostomy tube placement. The patient is non-verbal and answered my questions with a head nod. He denies abdominal pain. He denies a history of abdominal surgery.   ROS: Review of Systems  All other systems reviewed and are negative.   Family History  Adopted: Yes  Problem Relation Age of Onset   Multiple sclerosis Cousin    Heart disease Mother    Other Father        Does not know his father.    Past Medical History:  Diagnosis Date   Depression    Diabetes mellitus without complication (HCC)    Eye abnormalities    unspecified by pt   Hypertension    Multiple sclerosis (HCC)    Weakness    generalized    Past Surgical History:  Procedure Laterality Date   RADIOLOGY WITH ANESTHESIA N/A 11/11/2021   Procedure: MRI WITH ANESTHESIA BRAIN MRI WITH T SPINE AND CERVICAL SPINE W/O CONTRAST;  Surgeon: Radiologist, Medication, MD;  Location: [facility] OR;  Service: Radiology;  Laterality: N/A;   WISDOM TOOTH EXTRACTION      Social History:  reports that he has been smoking cigarettes. He has never used smokeless tobacco. He reports current alcohol use of about 12.0 standard drinks of alcohol per week. He reports current drug use. Frequency: 30.00 times per week. Drug: Marijuana.  Allergies: No Known Allergies  Medications Prior to Admission  Medication Sig Dispense Refill   acetaminophen (TYLENOL) 650 MG suppository Place 650 mg rectally every 6 (six) hours as needed  for fever (or pain).     DULCOLAX 10 MG suppository Place 10 mg rectally daily as needed (for constipation).     Emollient (EUCERIN INTENSIVE REPAIR EX) Apply 1 application  topically See admin instructions. Apply to face and body in the morning and at bedtime     gabapentin (NEURONTIN) 100 MG capsule Take 100 mg by mouth daily.     lidocaine 4 % Place 1 patch onto the skin See admin instructions. Apply 1 patch to the lower back and lower extremities once a day- remove as directed     polyethylene glycol powder (GLYCOLAX/MIRALAX) 17 GM/SCOOP powder Take 17 g by mouth daily.     potassium chloride 20 MEQ/15ML (10%) SOLN Take 10 mEq by mouth daily.     sertraline (ZOLOFT) 20 MG/ML concentrated solution Take 100 mg by mouth daily.     acetaminophen (TYLENOL) 325 MG tablet Take 1-2 tablets (325-650 mg total) by mouth every 4 (four) hours as needed for mild pain. (Patient not taking: Reported on 01/31/2023)     pantoprazole (PROTONIX) 40 MG tablet Take 1 tablet (40 mg total) by mouth daily. (Patient not taking: Reported on 01/31/2023) 30 tablet 0     Physical Exam: Blood pressure (!) 162/87, pulse (!) 113, temperature 97.7 F (36.5 C), temperature source Oral, resp. rate 20, height 5\' 10"  (1.778 m), weight 78.5 kg, SpO2 97%. General: chronically ill appearing male laying on hospital bed, NAD. HEENT: head -  normocephalic, atraumatic; Eyes: PERRLA, no conjunctival injection; Neck- Trachea is midline, no thyromegaly or JVD appreciated.  CV- RRR, normal S1/S2, no M/R/G, no lower extremity edema  Pulm- breathing is non-labored on room air, he has a strong, wet cough. No wheezes Abd- soft, NT/ND, appropriate bowel sounds in 4 quadrants, no masses, hernias, or organomegaly.  GU- deferred  MSK- UE/LE symmetrical, no cyanosis, clubbing, or edema. Muscle wasting present Neuro- R>L sided weakness  Psych- unable to assess Skin: warm and dry, hypopigmentation present on the abdominal wall   Results for  orders placed or performed during the hospital encounter of 01/31/23 (from the past 48 hour(s))  Comprehensive metabolic panel     Status: Abnormal   Collection Time: 01/31/23  2:36 PM  Result Value Ref Range   Sodium 156 (H) 135 - 145 mmol/L   Potassium 3.3 (L) 3.5 - 5.1 mmol/L   Chloride 120 (H) 98 - 111 mmol/L   CO2 24 22 - 32 mmol/L   Glucose, Bld 180 (H) 70 - 99 mg/dL    Comment: Glucose reference range applies only to samples taken after fasting for at least 8 hours.   BUN 23 (H) 6 - 20 mg/dL   Creatinine, Ser 7.84 (L) 0.61 - 1.24 mg/dL   Calcium 9.5 8.9 - 69.6 mg/dL   Total Protein 8.1 6.5 - 8.1 g/dL   Albumin 3.4 (L) 3.5 - 5.0 g/dL   AST 26 15 - 41 U/L   ALT 31 0 - 44 U/L   Alkaline Phosphatase 75 38 - 126 U/L   Total Bilirubin 0.8 0.3 - 1.2 mg/dL   GFR, Estimated >29 >52 mL/min    Comment: (NOTE) Calculated using the CKD-EPI Creatinine Equation (2021)    Anion gap 12 5 - 15    Comment: Performed at [facility], [address] [place], [place] [address]  CBC with Differential     Status: Abnormal   Collection Time: 01/31/23  2:36 PM  Result Value Ref Range   WBC 10.9 (H) 4.0 - 10.5 K/uL   RBC 5.27 4.22 - 5.81 MIL/uL   Hemoglobin 14.5 13.0 - 17.0 g/dL   HCT 44.0 10.2 - 72.5 %   MCV 90.7 80.0 - 100.0 fL   MCH 27.5 26.0 - 34.0 pg   MCHC 30.3 30.0 - 36.0 g/dL   RDW 36.6 44.0 - 34.7 %   Platelets 358 150 - 400 K/uL   nRBC 0.0 0.0 - 0.2 %   Neutrophils Relative % 75 %   Neutro Abs 8.2 (H) 1.7 - 7.7 K/uL   Lymphocytes Relative 18 %   Lymphs Abs 2.0 0.7 - 4.0 K/uL   Monocytes Relative 5 %   Monocytes Absolute 0.6 0.1 - 1.0 K/uL   Eosinophils Relative 1 %   Eosinophils Absolute 0.1 0.0 - 0.5 K/uL   Basophils Relative 1 %   Basophils Absolute 0.1 0.0 - 0.1 K/uL   Immature Granulocytes 0 %   Abs Immature Granulocytes 0.04 0.00 - 0.07 K/uL    Comment: Performed at [facility], [address], [place], [place] [address]   Protime-INR     Status: Abnormal   Collection Time: 01/31/23  2:36 PM  Result Value Ref Range   Prothrombin Time 17.6 (H) 11.4 - 15.2 seconds   INR 1.4 (H) 0.8 - 1.2    Comment: (NOTE) INR goal varies based on device and disease states. Performed at [facility], [address]  [place], [place] [address]   APTT     Status: None   Collection Time: 01/31/23  2:36 PM  Result Value Ref Range   aPTT 30 24 - 36 seconds    Comment: Performed at [facility], [address], [place], [place] [address]  Blood gas, venous (at [facility] and [facility])     Status: Abnormal   Collection Time: 01/31/23  2:36 PM  Result Value Ref Range   pH, Ven 7.49 (H) 7.25 - 7.43   pCO2, Ven 35 (L) 44 - 60 mmHg   pO2, Ven 38 32 - 45 mmHg   Bicarbonate 26.7 20.0 - 28.0 mmol/L   Acid-Base Excess 3.6 (H) 0.0 - 2.0 mmol/L   O2 Saturation 72.8 %   Patient temperature 37.0     Comment: Performed at [facility], [address] [place], [place] [address]  I-Stat Lactic Acid, ED     Status: None   Collection Time: 01/31/23  2:48 PM  Result Value Ref Range   Lactic Acid, Venous 1.9 0.5 - 1.9 mmol/L  Urinalysis, w/ Reflex to Culture (Infection Suspected) -Urine, Catheterized     Status: Abnormal   Collection Time: 01/31/23  2:58 PM  Result Value Ref Range   Specimen Source URINE, CATHETERIZED    Color, Urine AMBER (A) YELLOW    Comment: BIOCHEMICALS MAY BE AFFECTED BY COLOR   APPearance CLOUDY (A) CLEAR   Specific Gravity, Urine 1.027 1.005 - 1.030   pH 5.0 5.0 - 8.0   Glucose, UA NEGATIVE NEGATIVE mg/dL   Hgb urine dipstick SMALL (A) NEGATIVE   Bilirubin Urine NEGATIVE NEGATIVE   Ketones, ur 5 (A) NEGATIVE mg/dL   Protein, ur 30 (A) NEGATIVE mg/dL   Nitrite NEGATIVE NEGATIVE   Leukocytes,Ua SMALL (A) NEGATIVE   RBC / HPF 0-5 0 - 5 RBC/hpf   WBC, UA 0-5 0 - 5 WBC/hpf    Comment:        Reflex urine culture not performed if WBC <=10, OR if Squamous  epithelial cells >5. If Squamous epithelial cells >5 suggest recollection.    Bacteria, UA NONE SEEN NONE SEEN   Squamous Epithelial / HPF 0-5 0 - 5 /HPF    Comment: Performed at [facility], [address] [place], [place] [address]  Resp panel by RT-PCR (RSV, Flu A&B, Covid) Anterior Nasal Swab     Status: None   Collection Time: 01/31/23  3:36 PM   Specimen: Anterior Nasal Swab  Result Value Ref Range   SARS Coronavirus 2 by RT PCR NEGATIVE NEGATIVE    Comment: (NOTE) SARS-CoV-2 target nucleic acids are NOT DETECTED.  The SARS-CoV-2 RNA is generally detectable in upper respiratory specimens during the acute phase of infection. The lowest concentration of SARS-CoV-2 viral copies this assay can detect is 138 copies/mL. A negative result does not preclude SARS-Cov-2 infection and should not be used as the sole basis for treatment or other patient management decisions. A negative result may occur with  improper specimen collection/handling, submission of specimen other than nasopharyngeal swab, presence of viral mutation(s) within the areas targeted by this assay, and inadequate number of viral copies(<138 copies/mL). A negative result must be combined with clinical observations, patient history, and epidemiological information. The expected result is Negative.  Fact Sheet for Patients:  BloggerCourse.com  Fact Sheet for Healthcare Providers:  SeriousBroker.it  This test is no t yet approved or cleared by the [place] FDA and  has been  authorized for detection and/or diagnosis of SARS-CoV-2 by FDA under an Emergency Use Authorization (EUA). This EUA will remain  in effect (meaning this test can be used) for the duration of the COVID-19 declaration under Section 564(b)(1) of the Act, 21 U.S.C.section 360bbb-3(b)(1), unless the authorization is terminated  or revoked sooner.       Influenza A by PCR  NEGATIVE NEGATIVE   Influenza B by PCR NEGATIVE NEGATIVE    Comment: (NOTE) The Xpert Xpress SARS-CoV-2/FLU/RSV plus assay is intended as an aid in the diagnosis of influenza from Nasopharyngeal swab specimens and should not be used as a sole basis for treatment. Nasal washings and aspirates are unacceptable for Xpert Xpress SARS-CoV-2/FLU/RSV testing.  Fact Sheet for Patients: BloggerCourse.com  Fact Sheet for Healthcare Providers: SeriousBroker.it  This test is not yet approved or cleared by the [place] FDA and has been authorized for detection and/or diagnosis of SARS-CoV-2 by FDA under an Emergency Use Authorization (EUA). This EUA will remain in effect (meaning this test can be used) for the duration of the COVID-19 declaration under Section 564(b)(1) of the Act, 21 U.S.C. section 360bbb-3(b)(1), unless the authorization is terminated or revoked.     Resp Syncytial Virus by PCR NEGATIVE NEGATIVE    Comment: (NOTE) Fact Sheet for Patients: BloggerCourse.com  Fact Sheet for Healthcare Providers: SeriousBroker.it  This test is not yet approved or cleared by the [place] FDA and has been authorized for detection and/or diagnosis of SARS-CoV-2 by FDA under an Emergency Use Authorization (EUA). This EUA will remain in effect (meaning this test can be used) for the duration of the COVID-19 declaration under Section 564(b)(1) of the Act, 21 U.S.C. section 360bbb-3(b)(1), unless the authorization is terminated or revoked.  Performed at [facility], [address] [place], [place] [address]   I-Stat Lactic Acid, ED     Status: None   Collection Time: 01/31/23  4:54 PM  Result Value Ref Range   Lactic Acid, Venous 1.7 0.5 - 1.9 mmol/L  MRSA Next Gen by PCR, Nasal     Status: None   Collection Time: 01/31/23  8:45 PM   Specimen: Nasal Mucosa;  Nasal Swab  Result Value Ref Range   MRSA by PCR Next Gen NOT DETECTED NOT DETECTED    Comment: (NOTE) The GeneXpert MRSA Assay (FDA approved for NASAL specimens only), is one component of a comprehensive MRSA colonization surveillance program. It is not intended to diagnose MRSA infection nor to guide or monitor treatment for MRSA infections. Test performance is not FDA approved in patients less than 53 years old. Performed at [facility], [address], [place], [place] [address]   Basic metabolic panel     Status: Abnormal   Collection Time: 01/31/23 10:20 PM  Result Value Ref Range   Sodium 157 (H) 135 - 145 mmol/L   Potassium 3.4 (L) 3.5 - 5.1 mmol/L   Chloride 123 (H) 98 - 111 mmol/L   CO2 23 22 - 32 mmol/L   Glucose, Bld 122 (H) 70 - 99 mg/dL    Comment: Glucose reference range applies only to samples taken after fasting for at least 8 hours.   BUN 21 (H) 6 - 20 mg/dL   Creatinine, Ser 9.56 0.61 - 1.24 mg/dL   Calcium 8.3 (L) 8.9 - 10.3 mg/dL   GFR, Estimated >21 >30 mL/min    Comment: (NOTE) Calculated using the CKD-EPI Creatinine Equation (2021)    Anion gap 11 5 -  15    Comment: Performed at [facility], [address], [place], [place] [address]  Basic metabolic panel     Status: Abnormal   Collection Time: 02/01/23  6:07 AM  Result Value Ref Range   Sodium 157 (H) 135 - 145 mmol/L   Potassium 3.5 3.5 - 5.1 mmol/L   Chloride 123 (H) 98 - 111 mmol/L   CO2 22 22 - 32 mmol/L   Glucose, Bld 106 (H) 70 - 99 mg/dL    Comment: Glucose reference range applies only to samples taken after fasting for at least 8 hours.   BUN 16 6 - 20 mg/dL   Creatinine, Ser 0.98 0.61 - 1.24 mg/dL   Calcium 8.2 (L) 8.9 - 10.3 mg/dL   GFR, Estimated >11 >91 mL/min    Comment: (NOTE) Calculated using the CKD-EPI Creatinine Equation (2021)    Anion gap 12 5 - 15    Comment: Performed at [facility], [address],  [place], [place] [address]  CBC     Status: Abnormal   Collection Time: 02/01/23  6:07 AM  Result Value Ref Range   WBC 14.0 (H) 4.0 - 10.5 K/uL   RBC 4.38 4.22 - 5.81 MIL/uL   Hemoglobin 12.5 (L) 13.0 - 17.0 g/dL   HCT 56.2 13.0 - 86.5 %   MCV 94.7 80.0 - 100.0 fL   MCH 28.5 26.0 - 34.0 pg   MCHC 30.1 30.0 - 36.0 g/dL   RDW 78.4 69.6 - 29.5 %   Platelets 233 150 - 400 K/uL   nRBC 0.0 0.0 - 0.2 %    Comment: Performed at [facility], [address], [place], [place] [address]  HIV Antibody (routine testing w rflx)     Status: None   Collection Time: 02/01/23  6:07 AM  Result Value Ref Range   HIV Screen 4th Generation wRfx Non Reactive Non Reactive    Comment: Performed at [facility], [address], [place], [place] [address]  Basic metabolic panel     Status: Abnormal   Collection Time: 02/01/23 11:16 AM  Result Value Ref Range   Sodium 155 (H) 135 - 145 mmol/L   Potassium 3.2 (L) 3.5 - 5.1 mmol/L   Chloride 121 (H) 98 - 111 mmol/L   CO2 21 (L) 22 - 32 mmol/L   Glucose, Bld 117 (H) 70 - 99 mg/dL    Comment: Glucose reference range applies only to samples taken after fasting for at least 8 hours.   BUN 15 6 - 20 mg/dL   Creatinine, Ser 0.27 (L) 0.61 - 1.24 mg/dL   Calcium 8.4 (L) 8.9 - 10.3 mg/dL   GFR, Estimated >25 >36 mL/min    Comment: (NOTE) Calculated using the CKD-EPI Creatinine Equation (2021)    Anion gap 13 5 - 15    Comment: Performed at [facility], [address], [place], [place] [address]   CT ABDOMEN PELVIS W CONTRAST  Result Date: 01/31/2023 CLINICAL DATA:  Sepsis abdomen pain hypoxic EXAM: CT ABDOMEN AND PELVIS WITH CONTRAST TECHNIQUE: Multidetector CT imaging of the abdomen and pelvis was performed using the standard protocol following bolus administration of intravenous contrast. RADIATION DOSE REDUCTION: This exam was performed according to the departmental dose-optimization program which includes automated  exposure control, adjustment of the mA and/or kV according to patient size and/or use of iterative reconstruction technique. CONTRAST:  OMNIPAQUE IOHEXOL 350 MG/ML SOLN COMPARISON:  Chest x-ray 01/31/2023 FINDINGS: Lower chest: Lung bases demonstrate heterogeneous ground-glass density in the left lower lobe. Lower lobe pulmonary arterial filling defects are better seen on CTA chest. Hepatobiliary: No focal liver abnormality is seen. No gallstones, gallbladder wall thickening, or biliary dilatation. Pancreas: Unremarkable. No pancreatic ductal dilatation or surrounding inflammatory changes. Spleen: Normal in size without focal abnormality. Adrenals/Urinary Tract: Adrenal glands are normal. Kidneys show no hydronephrosis. Thick-walled urinary bladder with catheter Stomach/Bowel: Stomach nonenlarged. No dilated small bowel. Fluid in the rectosigmoid colon with suspicion of mild wall thickening and mucosal enhancement. Vascular/Lymphatic: Mild aortic atherosclerosis. No aneurysm. No suspicious lymph nodes Reproductive: Negative prostate Other: Negative for pelvic effusion or free air Musculoskeletal: No acute or suspicious osseous abnormality IMPRESSION: 1. Fluid in the rectosigmoid colon with suspicion of mild wall thickening and mucosal enhancement, correlate for symptoms of proctocolitis. 2. Thick-walled urinary bladder with catheter in place. Correlate with urinalysis to exclude cystitis 3. Heterogeneous ground-glass density in the left lower lobe, question pneumonia. Lower lobe pulmonary arterial filling defects are better seen on CTA chest. Reference chest CTA report 4. Aortic atherosclerosis. Aortic Atherosclerosis (ICD10-I70.0). Electronically Signed   By: Jasmine Pang M.D.   On: 01/31/2023 17:22   CT Angio Chest PE W and/or Wo Contrast  Result Date: 01/31/2023 CLINICAL DATA:  Shortness of breath. EXAM: CT ANGIOGRAPHY CHEST WITH CONTRAST TECHNIQUE: Multidetector CT imaging of the chest was performed  using the standard protocol during bolus administration of intravenous contrast. Multiplanar CT image reconstructions and MIPs were obtained to evaluate the vascular anatomy. RADIATION DOSE REDUCTION: This exam was performed according to the departmental dose-optimization program which includes automated exposure control, adjustment of the mA and/or kV according to patient size and/or use of iterative reconstruction technique. CONTRAST:  OMNIPAQUE IOHEXOL 350 MG/ML SOLN COMPARISON:  None Available. FINDINGS: Cardiovascular: Small filling defect is noted in lower lobe branch of left pulmonary artery consistent with pulmonary embolus. Normal cardiac size. No pericardial effusion. Mediastinum/Nodes: No enlarged mediastinal, hilar, or axillary lymph nodes. Thyroid gland, trachea, and esophagus demonstrate no significant findings. Lungs/Pleura: No pneumothorax or pleural effusion is noted. Aspirated material is noted in left lower lobe bronchi with mild patchy opacity seen in left lower lobe concerning for pneumonia. Smaller patchy opacities are noted in left upper and right lower lobes concerning for multifocal pneumonia. Upper Abdomen: No acute abnormality. Musculoskeletal: No chest wall abnormality. No acute or significant osseous findings. Review of the MIP images confirms the above findings. IMPRESSION: Small filling defect is noted in lower lobe branch of left pulmonary artery consistent with pulmonary embolus. Critical Value/emergent results were called by telephone at the time of interpretation on 01/31/2023 at 5:16 pm to provider DAVID YAO , who verbally acknowledged these results. Aspirated material is noted in left lower lobe bronchi with multiple patchy opacities in left lower lobe concerning for pneumonia. Smaller patchy opacities are also noted in left upper and right lower lobe concerning for multifocal pneumonia. Electronically Signed   By: Lupita Raider M.D.   On: 01/31/2023 17:17   CT HEAD WO  CONTRAST ( )  Result Date: 01/31/2023 CLINICAL DATA:  Mental status change, unknown cause EXAM: CT HEAD WITHOUT CONTRAST TECHNIQUE: Contiguous axial images were obtained from the base of the skull through the vertex without intravenous contrast. RADIATION DOSE REDUCTION: This exam was performed according to the departmental dose-optimization program which includes automated exposure control, adjustment of the mA and/or kV according to patient size and/or use of iterative reconstruction technique. COMPARISON:  MRI 11/11/2021 FINDINGS: Brain: Extensive low-density areas throughout the deep white matter compatible with severe demyelinating disease, stable since prior MRI. No hemorrhage, hydrocephalus or acute infarction. Vascular: No hyperdense vessel or unexpected calcification. Skull: No acute calvarial abnormality. Sinuses/Orbits: No acute findings Other: None IMPRESSION: Extensive patchy low-density throughout the deep white matter compatible with severe demyelinating disease, stable since recent MRI. Electronically Signed   By: Charlett Nose M.D.   On: 01/31/2023 17:13   DG Chest Port 1 View  Result Date: 01/31/2023 CLINICAL DATA:  Respiratory distress, sepsis EXAM: PORTABLE CHEST 1 VIEW COMPARISON:  11/07/2021 FINDINGS: The heart size and mediastinal contours are within normal limits. Both lungs are clear. The visualized skeletal structures are unremarkable. Artifact overlies the left upper lobe. IMPRESSION: No active disease. Electronically Signed   By: Judie Petit.  Shick M.D.   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 IR was consulted and do not have a window for percutaneous G tube. I agree that PEG tube is appropriate. Would recommend a PEG, possible laparoscopic or open gastrostomy tube in the operating room under general anesthesia.  - Please see Dr. Sheryn Bison note that outlines the patients brother, [name], as his only active family  member assisting in his care. I attempted to call the patients wife and she did not answer. I attempted to call the patients daughter and she did not answer. I attempted to call [name], twice, and his phone was off. I will re-attempt calling family to discuss surgery and get consent for gastrostomy tube.  - tentative plan for gastrostomy tube tomorrow in the OR, will have to post-pone the procedure if unable to obtain consent.    FEN - NPO, IVF VTE - SCD's, hold therapeutic lovenox, ok for heparin gtt if necessary, hold 10/4 at 0100 ID - Unasyn Admit - TRH service  Sepsis - present on admission, now resolved.  Acute respiratory failure due to aspiration pneumonia - now on room air, on Unasyn Single subsegmental PE, found this admission - hold lovenox for PEG placement HTN Advanced MS - wheelchair bound and non-verbal at baseline    I reviewed nursing notes, ED provider notes, hospitalist notes, last 24 h vitals and pain scores, last 48 h intake and output, last 24 h labs and trends, and last 24 h imaging results.  Adam Phenix, PA-C [facility] 02/01/2023, 2:01 PM Please see Amion for pager number during day hours 7:00am-4:30pm or 7:00am -11:30am on weekends

## 2023-02-01 NOTE — Assessment & Plan Note (Addendum)
Blood pressure normalized - As needed hydralazine

## 2023-02-01 NOTE — Progress Notes (Signed)
Attempted to place small bore feeding tube, patient immediately went into a panic state, Spo2 dropped into the 80's and patient began profusely gagging.  Patient stable Sp02 98%, respiration unlabored and symmetrical. Patient still on room air after attempt.   I recommend another attempt to be made using conscious sedation.   MD [place] notified.

## 2023-02-01 NOTE — Progress Notes (Signed)
[facility] [address] Hospitalized Hospice Patient   [name] is a current hospice patient followed for terminal diagnosis of unspecified severe protein calorie malnutrition. [name] was sent to the ED for evaluation when found at facility to be short of breath with oxygen saturation of 68%. [facility] was notified by Inpatient provider. Patient is admitted to [facility] with diagnosis of aspiration pneumonia. Per Dr. Patric Dykes, hospice physician, this is a hospice related hospital admission.  Visited patient in hospital and exchanged report with bedside nurse. Patient receiving chest PT while I visited and is not in distress. Patient is nonverbal but alert and communicated nonverbally understanding of my visit. Spoke with brother by phone. Patient is planning to have tube feeding started during this hospitalization.  Patient remains GIP appropriate due to need for IV fluids, IV electrolyte replacement, and IV antibiotics  Vital Signs: 97.8/90/18    140/94    O2 100% on RA  I&O:   4254/500  Abnormal labs: PCO2 35, KCL 3.3, Alb 3.4, WBC 10.9, PT 17.6, INR 1.4, Blood cultures pending  Diagnostics:  CT A1. Fluid in the rectosigmoid colon with suspicion of mild wall thickening and mucosal enhancement, correlate for symptoms of proctocolitis. 2. Thick-walled urinary bladder with catheter in place. Correlate with urinalysis to exclude cystitis 3. Heterogeneous ground-glass density in the left lower lobe, question pneumonia. Lower lobe pulmonary arterial filling defects are better seen on CTA chest. Reference chest CTA report 4. Aortic atherosclerosis.   CT Angio Chest: IMPRESSION: Small filling defect is noted in lower lobe branch of left pulmonary artery consistent with pulmonary embolus. Critical Value/emergent results were called by telephone at the time of interpretation on 01/31/2023 at 5:16 pm to provider DAVID YAO , who verbally acknowledged these  results.   Aspirated material is noted in left lower lobe bronchi with multiple patchy opacities in left lower lobe concerning for pneumonia. Smaller patchy opacities are also noted in left upper and right lower lobe concerning for multifocal pneumonia.  CT head without significant finding.   IV/PRN Meds: Unasyn 3G IV every 6 hours, Zithromax 500mg  IV x 1, Rocephin 1G x 1, D5 with KCL at 100, 10 meq KCL IV x 5, NS bolus total, Vancomycin 1750mg  IV x 1  Problem List:  * Severe sepsis due to aspiration pneumonia (HCC) Presented with tachycardia, tachypnea, leukocytosis, and respiratory failure in the setting of pneumonia. -Continue Unasyn day 2 of 5 - Follow blood cultures   Dysphagia Due to MS.     SLP consulted, feel that his swallowing is significantly worse than prior.  Discussed with brother by phone, clarified that PEG tube would not clearly reduce aspirations, but would allow for around administration of fluids and nutrition at goal rate to prevent dehydration or malnourishment. -Place core track now and start tube feeds - Consult IR for PEG tube placement   Hypernatremia Due to inadequate oral intake due to dysphagia due to MS. - Switch to hypotonic fluids to correct at a rate 1/2 mmol/L/hr.   Hypokalemia - Supplement K   Single subsegmental pulmonary embolism without acute cor pulmonale (HCC) Isolated small PE.  Likely asymptomatic. - Cotinue tx dose Lovenox   Acute respiratory failure with hypoxia (HCC) Presented with tachypnea to the 30s, dyspnea and requiring BiPAP.  Due to pneumonia.  Improving now - Chest PT    Multiple sclerosis (HCC) Advanced disability.  Nonverbal, wheelchair bound.  Follows with Dr. Malvin Johns in [place].  Not on disease  specific therapy at present.  Discharge Planning: Ongoing  Family Contact: Spoke with brother by phone.  IDT: Updated  Goals of Care: Full  Should patient need ambulance transfer at discharge- please use  [facility]) as they contract this service for our active hospice patients.  Glenna Fellows BSN, RN, [facility] hospital liaison [phone]

## 2023-02-01 NOTE — Assessment & Plan Note (Addendum)
Presented with tachycardia, tachypnea, leukocytosis, and respiratory failure in the setting of pneumonia. -Continue Unasyn day 4 of 5 - Blood cultures no growth - Respiratory therapy as able

## 2023-02-01 NOTE — Assessment & Plan Note (Addendum)
Isolated small PE.  Likely asymptomatic.  Very low risk. - Resume therapeutic dose Lovenox postop when able

## 2023-02-01 NOTE — Assessment & Plan Note (Addendum)
Due to inadequate oral intake due to dysphagia due to MS. Improving overnight on hypotonic fluids -Continue hypotonic fluids 1 more day

## 2023-02-01 NOTE — Assessment & Plan Note (Addendum)
Presented with tachypnea to the 30s, dyspnea and requiring BiPAP.  Due to pneumonia.  Now weaned to room air - Chest PT as able

## 2023-02-01 NOTE — Progress Notes (Signed)
Initial Nutrition Assessment  DOCUMENTATION CODES:   Non-severe (moderate) malnutrition in context of chronic illness  INTERVENTION:  - Possible plan for gastrostomy tube placement tomorrow, pending family's consent.   - If plan for feeding tube and to start tube feeds, would recommend: Osmolite 1.5 at 55 ml/h (1320 ml per day) *Would recommend starting at 20mL/hr and advance by 10mL Q12H Prosource TF20 60 ml BID Provides 2140 kcal, 123 gm protein, 1006 ml free water daily  - Monitor magnesium, potassium, and phosphorus BID for at least 3 days, MD to replete as needed, as pt is at risk for refeeding syndrome given malnutrition and predicted sub-optimal nutrient intake.  - Recommend 100mg  thiamine x5 days due to refeeding risk.   - Daily weights.    NUTRITION DIAGNOSIS:   Moderate Malnutrition related to chronic illness (severe multiple sclerosis) as evidenced by moderate fat depletion, moderate muscle depletion.  GOAL:  Patient will meet greater than or equal to 90% of their needs  MONITOR:   Diet advancement, Labs, Weight trends, TF tolerance  REASON FOR ASSESSMENT:   Consult Enteral/tube feeding initiation and management  ASSESSMENT:   42 y.o. male with PMH significant for paraplegia from severe multiple sclerosis admitted to the hospital with sepsis due to aspiration pneumonia and pulmonary embolus.   Patient mostly nonverbal and communicated with head nods/shakes.   Unsure of a UBW but feels he has lost weight, likely over the past few weeks.  No weight history within the past year to assess recent changes.   He trying to eat 3 meals a day and that appetite has been normal. No supplements PTA.   Patient had SLP eval today and recommended NPO. SLP note indicates patient was on a dysphagia diet PTA.   NGT attempted this afternoon but patient's O2 sats dropped to the 80's and started gagging profusely so unable to be placed.   IR consulted for PEG placement  however Surgery's note indicates IR had no window for percutaneous G tube placement.  Surgery now planning to place a possible laparoscopic or open gastrostomy tube in the operating room under general anesthesia.  Tentative plan for feeding tube placement tomorrow however Surgery awaiting consent from family.   Medications reviewed and include: D5 @ 134mL/hr (provides 408 kcals over 24 hours)  Labs reviewed:  Na 155 K+ 3.2    NUTRITION - FOCUSED PHYSICAL EXAM:  Flowsheet Row Most Recent Value  Orbital Region Moderate depletion  Upper Arm Region Moderate depletion  Thoracic and Lumbar Region Moderate depletion  Buccal Region Mild depletion  Temple Region Moderate depletion  Clavicle Bone Region Severe depletion  Clavicle and Acromion Bone Region Moderate depletion  Scapular Bone Region Unable to assess  Dorsal Hand Moderate depletion  Patellar Region Moderate depletion  Anterior Thigh Region Moderate depletion  Posterior Calf Region Moderate depletion  Edema (RD Assessment) None  Hair Reviewed  Eyes Reviewed  Mouth Reviewed  Skin Reviewed  Nails Reviewed       Diet Order:   Diet Order             Diet NPO time specified  Diet effective now                   EDUCATION NEEDS:  Education needs have been addressed  Skin:  Skin Assessment: Reviewed RN Assessment  Last BM:  PTA  Height:  Ht Readings from Last 1 Encounters:  01/31/23 5\' 10"  (1.778 m)   Weight:  Wt Readings from Last  1 Encounters:  01/31/23 78.5 kg    BMI:  Body mass index is 24.82 kg/m.  Estimated Nutritional Needs:  Kcal:  1950-2350 kcals Protein:  110-125 grams Fluid:  >/= 1.9L    Shelle Iron RD, LDN For contact information, refer to [facility].

## 2023-02-02 ENCOUNTER — Inpatient Hospital Stay (HOSPITAL_COMMUNITY)

## 2023-02-02 ENCOUNTER — Inpatient Hospital Stay (HOSPITAL_COMMUNITY): Admitting: Anesthesiology

## 2023-02-02 ENCOUNTER — Encounter (HOSPITAL_COMMUNITY)
Admission: EM | Disposition: A | Discharge status: Nursing Home (Includes ICF) | Payer: Self-pay | Source: Skilled Nursing Facility | Attending: Family Medicine

## 2023-02-02 ENCOUNTER — Encounter (HOSPITAL_COMMUNITY): Payer: Self-pay | Admitting: Internal Medicine

## 2023-02-02 DIAGNOSIS — J9601 Acute respiratory failure with hypoxia: Secondary | ICD-10-CM | POA: Diagnosis not present

## 2023-02-02 DIAGNOSIS — F17210 Nicotine dependence, cigarettes, uncomplicated: Secondary | ICD-10-CM | POA: Diagnosis not present

## 2023-02-02 DIAGNOSIS — I2693 Single subsegmental pulmonary embolism without acute cor pulmonale: Secondary | ICD-10-CM | POA: Diagnosis not present

## 2023-02-02 DIAGNOSIS — E870 Hyperosmolality and hypernatremia: Secondary | ICD-10-CM | POA: Diagnosis not present

## 2023-02-02 DIAGNOSIS — A419 Sepsis, unspecified organism: Secondary | ICD-10-CM | POA: Diagnosis not present

## 2023-02-02 DIAGNOSIS — E440 Moderate protein-calorie malnutrition: Secondary | ICD-10-CM | POA: Insufficient documentation

## 2023-02-02 DIAGNOSIS — R1310 Dysphagia, unspecified: Secondary | ICD-10-CM

## 2023-02-02 DIAGNOSIS — I10 Essential (primary) hypertension: Secondary | ICD-10-CM

## 2023-02-02 HISTORY — PX: LAPAROSCOPIC INSERTION GASTROSTOMY TUBE: SHX6817

## 2023-02-02 LAB — BASIC METABOLIC PANEL
Anion gap: 6 (ref 5–15)
BUN: 9 mg/dL (ref 6–20)
CO2: 22 mmol/L (ref 22–32)
Calcium: 7.9 mg/dL — ABNORMAL LOW (ref 8.9–10.3)
Chloride: 121 mmol/L — ABNORMAL HIGH (ref 98–111)
Creatinine, Ser: 0.51 mg/dL — ABNORMAL LOW (ref 0.61–1.24)
GFR, Estimated: >60 mL/min (ref >60)
Glucose, Bld: 151 mg/dL — ABNORMAL HIGH (ref 70–99)
Potassium: 2.9 mmol/L — ABNORMAL LOW (ref 3.5–5.1)
Sodium: 149 mmol/L — ABNORMAL HIGH (ref 135–145)

## 2023-02-02 LAB — CBC
HCT: 35.6 % — ABNORMAL LOW (ref 39.0–52.0)
Hemoglobin: 10.8 g/dL — ABNORMAL LOW (ref 13.0–17.0)
MCH: 28.4 pg (ref 26.0–34.0)
MCHC: 30.3 g/dL (ref 30.0–36.0)
MCV: 93.7 fL (ref 80.0–100.0)
Platelets: 210 10*3/uL (ref 150–400)
RBC: 3.8 MIL/uL — ABNORMAL LOW (ref 4.22–5.81)
RDW: 13.8 % (ref 11.5–15.5)
WBC: 6.2 10*3/uL (ref 4.0–10.5)
nRBC: 0 % (ref 0.0–0.2)

## 2023-02-02 LAB — ECHOCARDIOGRAM COMPLETE
Calc EF: 63.1 %
Height: 70 in
S' Lateral: 2.7 cm
Single Plane A2C EF: 60.1 %
Single Plane A4C EF: 65.8 %
Weight: 2768 [oz_av]

## 2023-02-02 LAB — GLUCOSE, CAPILLARY: Glucose-Capillary: 169 mg/dL — ABNORMAL HIGH (ref 70–99)

## 2023-02-02 LAB — PHOSPHORUS: Phosphorus: 1.8 mg/dL — ABNORMAL LOW (ref 2.5–4.6)

## 2023-02-02 LAB — MAGNESIUM: Magnesium: 1.8 mg/dL (ref 1.7–2.4)

## 2023-02-02 SURGERY — INSERTION, GASTROSTOMY TUBE, PERCUTANEOUS
Anesthesia: General

## 2023-02-02 MED ORDER — ONDANSETRON HCL 4 MG/2ML IJ SOLN
INTRAMUSCULAR | Status: DC | PRN
  Administered 2023-02-02: 4 mg via INTRAVENOUS

## 2023-02-02 MED ORDER — PROPOFOL 10 MG/ML IV BOLUS
INTRAVENOUS | Status: AC
  Filled 2023-02-02: qty 20

## 2023-02-02 MED ORDER — POTASSIUM CHLORIDE 2 MEQ/ML IV SOLN
INTRAVENOUS | Status: DC
  Administered 2023-02-02: 100 mL/h via INTRAVENOUS
  Filled 2023-02-02 (×3): qty 1000

## 2023-02-02 MED ORDER — FENTANYL CITRATE PF 50 MCG/ML IJ SOSY: 25.0000 ug | PREFILLED_SYRINGE | INTRAMUSCULAR | Status: DC | PRN

## 2023-02-02 MED ORDER — CHLORHEXIDINE GLUCONATE 0.12 % MT SOLN
15.0000 mL | Freq: Once | OROMUCOSAL | Status: AC
  Administered 2023-02-02: 15 mL via OROMUCOSAL

## 2023-02-02 MED ORDER — SUGAMMADEX SODIUM 200 MG/2ML IV SOLN
INTRAVENOUS | Status: DC | PRN
  Administered 2023-02-02: 160 mg via INTRAVENOUS

## 2023-02-02 MED ORDER — OSMOLITE 1.2 CAL PO LIQD
1000.0000 mL | ORAL | Status: DC
  Administered 2023-02-02: 1000 mL

## 2023-02-02 MED ORDER — ROCURONIUM BROMIDE 10 MG/ML (PF) SYRINGE
PREFILLED_SYRINGE | INTRAVENOUS | Status: AC
  Filled 2023-02-02: qty 10

## 2023-02-02 MED ORDER — DEXAMETHASONE SODIUM PHOSPHATE 10 MG/ML IJ SOLN
INTRAMUSCULAR | Status: DC | PRN
  Administered 2023-02-02: 4 mg via INTRAVENOUS

## 2023-02-02 MED ORDER — LACTATED RINGERS IV SOLN: INTRAVENOUS | Status: DC

## 2023-02-02 MED ORDER — BUPIVACAINE-EPINEPHRINE 0.25% -1:200000 IJ SOLN
INTRAMUSCULAR | Status: DC | PRN
  Administered 2023-02-02: 20 mL

## 2023-02-02 MED ORDER — BUPIVACAINE-EPINEPHRINE 0.25% -1:200000 IJ SOLN
INTRAMUSCULAR | Status: AC
  Filled 2023-02-02: qty 1

## 2023-02-02 MED ORDER — THIAMINE MONONITRATE 100 MG PO TABS
100.0000 mg | ORAL_TABLET | Freq: Every day | ORAL | Status: DC
  Administered 2023-02-02 – 2023-02-04 (×3): 100 mg
  Filled 2023-02-02 (×3): qty 1

## 2023-02-02 MED ORDER — PROPOFOL 10 MG/ML IV BOLUS
INTRAVENOUS | Status: DC | PRN
  Administered 2023-02-02: 130 mg via INTRAVENOUS

## 2023-02-02 MED ORDER — POTASSIUM CHLORIDE 10 MEQ/100ML IV SOLN
10.0000 meq | INTRAVENOUS | Status: AC
  Administered 2023-02-02 (×2): 10 meq via INTRAVENOUS
  Filled 2023-02-02 (×3): qty 100

## 2023-02-02 MED ORDER — FENTANYL CITRATE (PF) 100 MCG/2ML IJ SOLN
INTRAMUSCULAR | Status: AC
  Filled 2023-02-02: qty 2

## 2023-02-02 MED ORDER — PHENYLEPHRINE 80 MCG/ML (10ML) SYRINGE FOR IV PUSH (FOR BLOOD PRESSURE SUPPORT)
PREFILLED_SYRINGE | INTRAVENOUS | Status: DC | PRN
  Administered 2023-02-02 (×2): 80 ug via INTRAVENOUS
  Administered 2023-02-02: 160 ug via INTRAVENOUS
  Administered 2023-02-02 (×2): 80 ug via INTRAVENOUS

## 2023-02-02 MED ORDER — ROCURONIUM BROMIDE 10 MG/ML (PF) SYRINGE
PREFILLED_SYRINGE | INTRAVENOUS | Status: DC | PRN
  Administered 2023-02-02: 40 mg via INTRAVENOUS
  Administered 2023-02-02: 20 mg via INTRAVENOUS

## 2023-02-02 MED ORDER — MIDAZOLAM HCL 2 MG/2ML IJ SOLN
INTRAMUSCULAR | Status: AC
  Filled 2023-02-02: qty 2

## 2023-02-02 MED ORDER — ORAL CARE MOUTH RINSE: 15.0000 mL | Freq: Once | OROMUCOSAL | Status: AC

## 2023-02-02 MED ORDER — LIDOCAINE HCL (PF) 2 % IJ SOLN
INTRAMUSCULAR | Status: AC
  Filled 2023-02-02: qty 5

## 2023-02-02 MED ORDER — STERILE WATER FOR IRRIGATION IR SOLN
Status: DC | PRN
  Administered 2023-02-02: 1000 mL

## 2023-02-02 MED ORDER — LIDOCAINE 2% (20 MG/ML) 5 ML SYRINGE
INTRAMUSCULAR | Status: DC | PRN
  Administered 2023-02-02: 40 mg via INTRAVENOUS

## 2023-02-02 MED ORDER — PHENYLEPHRINE HCL-NACL 20-0.9 MG/250ML-% IV SOLN
INTRAVENOUS | Status: DC | PRN
Start: 2023-02-02 — End: 2023-02-02
  Administered 2023-02-02: 50 ug/min via INTRAVENOUS

## 2023-02-02 MED ORDER — ROCURONIUM BROMIDE 100 MG/10ML IV SOLN
INTRAVENOUS | Status: DC | PRN
Start: 2023-02-02 — End: 2023-02-02
  Administered 2023-02-02: 40 mg via INTRAVENOUS

## 2023-02-02 MED ORDER — ONDANSETRON HCL 4 MG/2ML IJ SOLN
INTRAMUSCULAR | Status: AC
  Filled 2023-02-02: qty 2

## 2023-02-02 MED ORDER — ACETAMINOPHEN 10 MG/ML IV SOLN: 1000.0000 mg | Freq: Once | INTRAVENOUS | Status: DC | PRN

## 2023-02-02 MED ORDER — SUCCINYLCHOLINE CHLORIDE 200 MG/10ML IV SOSY
PREFILLED_SYRINGE | INTRAVENOUS | Status: DC | PRN
  Administered 2023-02-02: 100 mg via INTRAVENOUS

## 2023-02-02 MED ORDER — DEXAMETHASONE SODIUM PHOSPHATE 10 MG/ML IJ SOLN
INTRAMUSCULAR | Status: AC
  Filled 2023-02-02: qty 1

## 2023-02-02 MED ORDER — MIDAZOLAM HCL 2 MG/2ML IJ SOLN
INTRAMUSCULAR | Status: DC | PRN
  Administered 2023-02-02: 1 mg via INTRAVENOUS

## 2023-02-02 MED ORDER — FENTANYL CITRATE (PF) 100 MCG/2ML IJ SOLN
INTRAMUSCULAR | Status: DC | PRN
  Administered 2023-02-02 (×2): 50 ug via INTRAVENOUS

## 2023-02-02 SURGICAL SUPPLY — 44 items
ADH SKN CLS APL DERMABOND .7 (GAUZE/BANDAGES/DRESSINGS)
APL PRP STRL LF DISP 70% ISPRP (MISCELLANEOUS) ×1
APL SKNCLS STERI-STRIP NONHPOA (GAUZE/BANDAGES/DRESSINGS)
BAG COUNTER SPONGE SURGICOUNT (BAG) IMPLANT
BAG SPNG CNTER NS LX DISP (BAG) ×1
BENZOIN TINCTURE PRP APPL 2/3 (GAUZE/BANDAGES/DRESSINGS) IMPLANT
BNDG ADH 1X3 SHEER STRL LF (GAUZE/BANDAGES/DRESSINGS) ×2 IMPLANT
BNDG ADH THN 3X1 STRL LF (GAUZE/BANDAGES/DRESSINGS) ×2
CABLE HIGH FREQUENCY MONO STRZ (ELECTRODE) IMPLANT
CHLORAPREP W/TINT 26 (MISCELLANEOUS) ×1 IMPLANT
COVER SURGICAL LIGHT HANDLE (MISCELLANEOUS) ×1 IMPLANT
DERMABOND ADVANCED .7 DNX12 (GAUZE/BANDAGES/DRESSINGS) IMPLANT
G-TUBE MIC BOLUS 22FR ENFIT (TUBING) IMPLANT
G-TUBE MIC BOLUS 24FR ENFIT (CATHETERS) IMPLANT
GLOVE BIOGEL PI IND STRL 7.0 (GLOVE) ×1 IMPLANT
GLOVE SURG SS PI 7.0 STRL IVOR (GLOVE) ×1 IMPLANT
GOWN STRL REUS W/ TWL LRG LVL3 (GOWN DISPOSABLE) ×1 IMPLANT
GOWN STRL REUS W/ TWL XL LVL3 (GOWN DISPOSABLE) IMPLANT
GOWN STRL REUS W/TWL LRG LVL3 (GOWN DISPOSABLE) ×1
GOWN STRL REUS W/TWL XL LVL3 (GOWN DISPOSABLE)
GRASPER SUT TROCAR 14GX15 (MISCELLANEOUS) IMPLANT
IRRIG SUCT STRYKERFLOW 2 WTIP (MISCELLANEOUS)
IRRIGATION SUCT STRKRFLW 2 WTP (MISCELLANEOUS) IMPLANT
KIT BASIN OR (CUSTOM PROCEDURE TRAY) ×1 IMPLANT
KIT TURNOVER KIT A (KITS) IMPLANT
LUBRICANT JELLY K Y 4OZ (MISCELLANEOUS) IMPLANT
PENCIL SMOKE EVACUATOR (MISCELLANEOUS) IMPLANT
SCISSORS LAP 5X35 DISP (ENDOMECHANICALS) IMPLANT
SET IRRIG Y TYPE TUR BLADDER L (SET/KITS/TRAYS/PACK) IMPLANT
SET TUBE SMOKE EVAC HIGH FLOW (TUBING) ×1 IMPLANT
SLEEVE Z-THREAD 5X100MM (TROCAR) ×1 IMPLANT
SPIKE FLUID TRANSFER (MISCELLANEOUS) ×1 IMPLANT
STRIP CLOSURE SKIN 1/2X4 (GAUZE/BANDAGES/DRESSINGS) IMPLANT
SUT ETHILON 2 0 PS N (SUTURE) IMPLANT
SUT MNCRL AB 4-0 PS2 18 (SUTURE) ×1 IMPLANT
SUT SILK 2 0 SH (SUTURE) ×3 IMPLANT
SUT SILK 3 0 SH 30 (SUTURE) IMPLANT
TOWEL OR 17X26 10 PK STRL BLUE (TOWEL DISPOSABLE) ×1 IMPLANT
TOWEL OR NON WOVEN STRL DISP B (DISPOSABLE) IMPLANT
TRAY LAPAROSCOPIC (CUSTOM PROCEDURE TRAY) ×1 IMPLANT
TROCAR Z-THREAD OPTICAL 5X100M (TROCAR) ×1 IMPLANT
TUBE GASTRO BOLUS 20FR ENFIT (TUBING) IMPLANT
TUBE GASTRO BOLUS 22FR ENFIT (TUBING) IMPLANT
TUBE GASTRO BOLUS 24FR ENFIT (CATHETERS) IMPLANT

## 2023-02-02 NOTE — Progress Notes (Addendum)
Nutrition Follow-up  DOCUMENTATION CODES:   Non-severe (moderate) malnutrition in context of chronic illness  INTERVENTION:  - Per Surgery, can start tube feeds through new G-tube this evening.  Plan to start Osmolite 1.2 at 58mL/hr and not advance at this time.   - Once able to advance tube feeds, recommend: Osmolite 1.5 at 55 ml/h (1320 ml per day) *Would recommend increasing by 10mL Q12H Prosource TF20 60 ml BID Provides 2140 kcal, 123 gm protein, 1006 ml free water daily   - Monitor magnesium, potassium, and phosphorus BID for at least 3 days, MD to replete as needed, as pt is at risk for refeeding syndrome given malnutrition and predicted sub-optimal nutrient intake PTA  - Add 100mg  thiamine x5 days due to refeeding risk.    - Daily weights.    NUTRITION DIAGNOSIS:   Moderate Malnutrition related to chronic illness (severe multiple sclerosis) as evidenced by moderate fat depletion, moderate muscle depletion. *ongoing  GOAL:   Patient will meet greater than or equal to 90% of their needs *progressing, starting TF's  MONITOR:   Diet advancement, Labs, Weight trends, TF tolerance  REASON FOR ASSESSMENT:   Consult Enteral/tube feeding initiation and management  ASSESSMENT:   42 y.o. male with PMH significant for paraplegia from severe multiple sclerosis admitted to the hospital with sepsis due to aspiration pneumonia and pulmonary embolus.  10/2: Admit 10/3: SLP eval -> rec'd NPO 10/4: G-tube placed  Family consented for feeding tube placement so paitent is now s/p gastrostomy tube placement this afternoon.   Per discussion with Surgery MD Dr. Sheliah Hatch, can start tube feeds tonight. Surgery added orders to start Osm 1.2 at 69mL/hr. Per RD discussion with Dr. Sheliah Hatch, can advance as tolerated.   Patient noted to be receiving D5 at 141mL/hr. Reached out to [facility] MD to discuss discontinuing now that starting tube feeds.  Per discussion with MD, plan to leave  patient at 31mL/hr for this evening and re-assess tomorrow for ability to start advancing.    Medications reviewed and include: D5 @ 146mL/hr (provides 408 kcals over 24 hours)  Labs reviewed:  Na 149 K+ 2.9   Diet Order:   Diet Order             Diet NPO time specified  Diet effective now                   EDUCATION NEEDS:  Education needs have been addressed  Skin:  Skin Assessment: Reviewed RN Assessment  Last BM:  PTA  Height:  Ht Readings from Last 1 Encounters:  02/02/23 5\' 10"  (1.778 m)   Weight:  Wt Readings from Last 1 Encounters:  02/02/23 78.5 kg   BMI:  Body mass index is 24.82 kg/m.  Estimated Nutritional Needs:  Kcal:  1950-2350 kcals Protein:  110-125 grams Fluid:  >/= 1.9L    Shelle Iron RD, LDN For contact information, refer to [facility].

## 2023-02-02 NOTE — Anesthesia Preprocedure Evaluation (Addendum)
Anesthesia Evaluation  Patient identified by MRN, date of birth, ID band Patient confused    Reviewed: Allergy & Precautions, NPO status , Patient's Chart, lab work & pertinent test results  Airway Mallampati: II  TM Distance: >3 FB Neck ROM: Full    Dental no notable dental hx.    Pulmonary Current Smoker   Pulmonary exam normal        Cardiovascular hypertension,  Rhythm:Regular Rate:Normal     Neuro/Psych    Depression    MS  Neuromuscular disease    GI/Hepatic Neg liver ROS,GERD  Medicated,,  Endo/Other  diabetes    Renal/GU negative Renal ROS  negative genitourinary   Musculoskeletal negative musculoskeletal ROS (+)    Abdominal Normal abdominal exam  (+)   Peds  Hematology negative hematology ROS (+)   Anesthesia Other Findings   Reproductive/Obstetrics                             Anesthesia Physical Anesthesia Plan  ASA: 3  Anesthesia Plan: General   Post-op Pain Management:    Induction: Intravenous and Rapid sequence  PONV Risk Score and Plan: 1 and Ondansetron, Dexamethasone, Midazolam and Treatment may vary due to age or medical condition  Airway Management Planned: Mask and Oral ETT  Additional Equipment: None  Intra-op Plan:   Post-operative Plan: Extubation in OR  Informed Consent: I have reviewed the patients History and Physical, chart, labs and discussed the procedure including the risks, benefits and alternatives for the proposed anesthesia with the patient or authorized representative who has indicated his/her understanding and acceptance.     Dental advisory given  Plan Discussed with: CRNA  Anesthesia Plan Comments:        Anesthesia Quick Evaluation

## 2023-02-02 NOTE — Progress Notes (Signed)
Subjective: No acute changes  ROS: See above, otherwise other systems negative  Objective: Vital signs in last 24 hours: Temp:  [97.8 F (36.6 C)-98.5 F (36.9 C)] 98.1 F (36.7 C) (10/04 0506) Pulse Rate:  [78-113] 98 (10/04 0506) Resp:  [12-20] 16 (10/04 0506) BP: (133-167)/(73-108) 137/98 (10/04 0506) SpO2:  [97 %-100 %] 98 % (10/04 0506) Last BM Date :  (PTA)  Intake/Output from previous day: 10/03 0701 - 10/04 0700 In: -  Out: 1150 [Urine:1150] Intake/Output this shift: No intake/output data recorded.  PE: Gen: NAD Resp: nonlabored CV: RRR Abd: soft, NT, ND  Lab Results:  Recent Labs    02/01/23 0607 02/02/23 0549  WBC 14.0* 6.2  HGB 12.5* 10.8*  HCT 41.5 35.6*  PLT 233 210   BMET Recent Labs    02/01/23 1116 02/02/23 0549  NA 155* 149*  K 3.2* 2.9*  CL 121* 121*  CO2 21* 22  GLUCOSE 117* 151*  BUN 15 9  CREATININE 0.53* 0.51*  CALCIUM 8.4* 7.9*   PT/INR Recent Labs    01/31/23 1436  LABPROT 17.6*  INR 1.4*   CMP     Component Value Date/Time   NA 149 (H) 02/02/2023 0549   NA 142 07/26/2016 1005   K 2.9 (L) 02/02/2023 0549   CL 121 (H) 02/02/2023 0549   CO2 22 02/02/2023 0549   GLUCOSE 151 (H) 02/02/2023 0549   BUN 9 02/02/2023 0549   BUN 10 07/26/2016 1005   CREATININE 0.51 (L) 02/02/2023 0549   CALCIUM 7.9 (L) 02/02/2023 0549   PROT 8.1 01/31/2023 1436   PROT 7.1 07/26/2016 1005   ALBUMIN 3.4 (L) 01/31/2023 1436   ALBUMIN 4.6 07/26/2016 1005   AST 26 01/31/2023 1436   ALT 31 01/31/2023 1436   ALKPHOS 75 01/31/2023 1436   BILITOT 0.8 01/31/2023 1436   BILITOT 0.5 07/26/2016 1005   GFRNONAA >60 02/02/2023 0549   GFRAA >60 06/05/2018 0702   Lipase     Component Value Date/Time   LIPASE 37 11/11/2017 1529    Studies/Results: CT ABDOMEN PELVIS W CONTRAST  Result Date: 01/31/2023 CLINICAL DATA:  Sepsis abdomen pain hypoxic EXAM: CT ABDOMEN AND PELVIS WITH CONTRAST TECHNIQUE: Multidetector CT imaging of the  abdomen and pelvis was performed using the standard protocol following bolus administration of intravenous contrast. RADIATION DOSE REDUCTION: This exam was performed according to the departmental dose-optimization program which includes automated exposure control, adjustment of the mA and/or kV according to patient size and/or use of iterative reconstruction technique. CONTRAST:  OMNIPAQUE IOHEXOL 350 MG/ML SOLN COMPARISON:  Chest x-ray 01/31/2023 FINDINGS: Lower chest: Lung bases demonstrate heterogeneous ground-glass density in the left lower lobe. Lower lobe pulmonary arterial filling defects are better seen on CTA chest. Hepatobiliary: No focal liver abnormality is seen. No gallstones, gallbladder wall thickening, or biliary dilatation. Pancreas: Unremarkable. No pancreatic ductal dilatation or surrounding inflammatory changes. Spleen: Normal in size without focal abnormality. Adrenals/Urinary Tract: Adrenal glands are normal. Kidneys show no hydronephrosis. Thick-walled urinary bladder with catheter Stomach/Bowel: Stomach nonenlarged. No dilated small bowel. Fluid in the rectosigmoid colon with suspicion of mild wall thickening and mucosal enhancement. Vascular/Lymphatic: Mild aortic atherosclerosis. No aneurysm. No suspicious lymph nodes Reproductive: Negative prostate Other: Negative for pelvic effusion or free air Musculoskeletal: No acute or suspicious osseous abnormality IMPRESSION: 1. Fluid in the rectosigmoid colon with suspicion of mild wall thickening and mucosal enhancement, correlate for symptoms of proctocolitis. 2. Thick-walled urinary bladder with  catheter in place. Correlate with urinalysis to exclude cystitis 3. Heterogeneous ground-glass density in the left lower lobe, question pneumonia. Lower lobe pulmonary arterial filling defects are better seen on CTA chest. Reference chest CTA report 4. Aortic atherosclerosis. Aortic Atherosclerosis (ICD10-I70.0). Electronically Signed   By: Jasmine Pang M.D.   On: 01/31/2023 17:22   CT Angio Chest PE W and/or Wo Contrast  Result Date: 01/31/2023 CLINICAL DATA:  Shortness of breath. EXAM: CT ANGIOGRAPHY CHEST WITH CONTRAST TECHNIQUE: Multidetector CT imaging of the chest was performed using the standard protocol during bolus administration of intravenous contrast. Multiplanar CT image reconstructions and MIPs were obtained to evaluate the vascular anatomy. RADIATION DOSE REDUCTION: This exam was performed according to the departmental dose-optimization program which includes automated exposure control, adjustment of the mA and/or kV according to patient size and/or use of iterative reconstruction technique. CONTRAST:  OMNIPAQUE IOHEXOL 350 MG/ML SOLN COMPARISON:  None Available. FINDINGS: Cardiovascular: Small filling defect is noted in lower lobe branch of left pulmonary artery consistent with pulmonary embolus. Normal cardiac size. No pericardial effusion. Mediastinum/Nodes: No enlarged mediastinal, hilar, or axillary lymph nodes. Thyroid gland, trachea, and esophagus demonstrate no significant findings. Lungs/Pleura: No pneumothorax or pleural effusion is noted. Aspirated material is noted in left lower lobe bronchi with mild patchy opacity seen in left lower lobe concerning for pneumonia. Smaller patchy opacities are noted in left upper and right lower lobes concerning for multifocal pneumonia. Upper Abdomen: No acute abnormality. Musculoskeletal: No chest wall abnormality. No acute or significant osseous findings. Review of the MIP images confirms the above findings. IMPRESSION: Small filling defect is noted in lower lobe branch of left pulmonary artery consistent with pulmonary embolus. Critical Value/emergent results were called by telephone at the time of interpretation on 01/31/2023 at 5:16 pm to provider DAVID YAO , who verbally acknowledged these results. Aspirated material is noted in left lower lobe bronchi with multiple patchy  opacities in left lower lobe concerning for pneumonia. Smaller patchy opacities are also noted in left upper and right lower lobe concerning for multifocal pneumonia. Electronically Signed   By: Lupita Raider M.D.   On: 01/31/2023 17:17   CT HEAD WO CONTRAST ( )  Result Date: 01/31/2023 CLINICAL DATA:  Mental status change, unknown cause EXAM: CT HEAD WITHOUT CONTRAST TECHNIQUE: Contiguous axial images were obtained from the base of the skull through the vertex without intravenous contrast. RADIATION DOSE REDUCTION: This exam was performed according to the departmental dose-optimization program which includes automated exposure control, adjustment of the mA and/or kV according to patient size and/or use of iterative reconstruction technique. COMPARISON:  MRI 11/11/2021 FINDINGS: Brain: Extensive low-density areas throughout the deep white matter compatible with severe demyelinating disease, stable since prior MRI. No hemorrhage, hydrocephalus or acute infarction. Vascular: No hyperdense vessel or unexpected calcification. Skull: No acute calvarial abnormality. Sinuses/Orbits: No acute findings Other: None IMPRESSION: Extensive patchy low-density throughout the deep white matter compatible with severe demyelinating disease, stable since recent MRI. Electronically Signed   By: Charlett Nose M.D.   On: 01/31/2023 17:13   DG Chest Port 1 View  Result Date: 01/31/2023 CLINICAL DATA:  Respiratory distress, sepsis EXAM: PORTABLE CHEST 1 VIEW COMPARISON:  11/07/2021 FINDINGS: The heart size and mediastinal contours are within normal limits. Both lungs are clear. The visualized skeletal structures are unremarkable. Artifact overlies the left upper lobe. IMPRESSION: No active disease. Electronically Signed   By: Judie Petit.  Shick M.D.   On: 01/31/2023 15:25  Anti-infectives: Anti-infectives (From admission, onward)    Start     Dose/Rate Route Frequency Ordered Stop   01/31/23 1900  Ampicillin-Sulbactam (UNASYN) 3  g in sodium chloride 0.9 % 100 mL IVPB        3 g 200 mL/hr over 30 Minutes Intravenous Every 6 hours 01/31/23 1849     01/31/23 1500  vancomycin (VANCOREADY) IVPB 1750 mg/350 mL        1,750 mg 175 mL/hr over 120 Minutes Intravenous  Once 01/31/23 1448 01/31/23 1900   01/31/23 1445  cefTRIAXone (ROCEPHIN) 1 g in sodium chloride 0.9 % 100 mL IVPB        1 g 200 mL/hr over 30 Minutes Intravenous  Once 01/31/23 1439 01/31/23 1753   01/31/23 1445  azithromycin (ZITHROMAX) 500 mg in sodium chloride 0.9 % 250 mL IVPB        500 mg 250 mL/hr over 60 Minutes Intravenous  Once 01/31/23 1439 01/31/23 1753       Assessment/Plan  42 yo male with aspiration pneumonia and progressive MS. -I discussed feeding tube placement with his brother Rocky Link who is the involved decision maker. -We will proceed with g tube insertion today  I reviewed last 24 h vitals and pain scores, last 48 h intake and output, last 24 h labs and trends, and last 24 h imaging results.  This care required high  level of medical decision making.    LOS: 2 days   [facility] Surgery 02/02/2023, 10:56 AM Please see Amion for pager number during day hours 7:00am-4:30pm or 7:00am -11:30am on weekends

## 2023-02-02 NOTE — Anesthesia Procedure Notes (Signed)
Procedure Name: Intubation Date/Time: 02/02/2023 12:31 PM  Performed by: Sindy Guadeloupe, CRNAPre-anesthesia Checklist: Patient identified, Emergency Drugs available, Suction available, Patient being monitored and Timeout performed Patient Re-evaluated:Patient Re-evaluated prior to induction Oxygen Delivery Method: Circle system utilized Preoxygenation: Pre-oxygenation with 100% oxygen Induction Type: IV induction, Rapid sequence and Cricoid Pressure applied Laryngoscope Size: Mac and 4 Grade View: Grade I Tube type: Oral Tube size: 7.5 mm Number of attempts: 1 Airway Equipment and Method: Stylet Placement Confirmation: ETT inserted through vocal cords under direct vision, positive ETCO2 and breath sounds checked- equal and bilateral Secured at: 23 cm Tube secured with: Tape Dental Injury: Teeth and Oropharynx as per pre-operative assessment

## 2023-02-02 NOTE — Progress Notes (Signed)
  Progress Note   Patient: [name] ZOX:[id number] DOB: 03-Feb-1981 DOA: 01/31/2023     2 DOS: the patient was seen and examined on 02/02/2023 at 11:10 AM      Brief hospital course: [name] is a 42 y.o. M with hx relapsing remitting MS, not currently on disease specific therapy, wheelchair bound and nonverbal at baseline, lives at [facility], also HTN who presented with apparent respiratory distress.  SpO2 68% with EMS, tachypneic to 30s in ER, placed on BiPAP, found to have subsegmental PE and bilateral aspiration pneumonia.       Assessment and Plan: * Severe sepsis due to aspiration pneumonia (HCC) Presented with tachycardia, tachypnea, leukocytosis, and respiratory failure in the setting of pneumonia. -Continue Unasyn day 3 of 5 - Blood cultures no growth - Respiratory therapy as able    Dysphagia See summary from yesterday Patient is more alert today, although is nonverbal, he is fairly consistent in his responses nodding yes and shaking head no.  And I am confident that he is informed appropriately of the benefits and potential harms of low risk surgery to place PEG tube for nutrition and fluids. - To the OR today for PEG placement - Consult dietitian for tube feeds  Hypernatremia Due to inadequate oral intake due to dysphagia due to MS. Improving overnight on hypotonic fluids -Continue hypotonic fluids 1 more day  Malnutrition of moderate degree related to chronic illness (severe multiple sclerosis) as evidenced by moderate fat depletion, moderate muscle depletion.   Hypokalemia Potassium lower -Continue supplement K  Single subsegmental pulmonary embolism without acute cor pulmonale (HCC) Isolated small PE.  Likely asymptomatic.  Very low risk. - Resume therapeutic dose Lovenox postop when able  Acute respiratory failure with hypoxia (HCC) Presented with tachypnea to the 30s, dyspnea and requiring BiPAP.  Due to pneumonia.  Now weaned to  room air - Chest PT as able  Hypertension Blood pressure normalized - As needed hydralazine  Multiple sclerosis (HCC) Advanced disability.  Nonverbal, wheelchair bound.  Follows with Dr. Malvin Johns in [place].  Not on disease specific therapy at present.  Medical decision making/goals of care Again attempted to reach patient's daughter, no answer.  Reasonable efforts have been made by hospital staff and patient's family to reach the daughter with no success.  See notes from 10/3 regarding decision making.         Subjective: Patient denies pain, dyspnea.  No fever, no respiratory distress, no nursing concerns.  To the OR today.     Physical Exam: BP 137/88   Pulse 83   Temp 98.6 F (37 C) (Oral)   Resp 18   Ht 5\' 10"  (1.778 m)   Wt 78.5 kg   SpO2 99%   BMI 24.82 kg/m   Thin chronically ill-appearing adult male, lying in bed RRR, no murmurs, no peripheral edema Respiratory rate normal, lung sounds diminished overall Abdomen without grimace to palpation Attention seems normal, nods his head and shakes his head yes and no appropriately, contractures of all 4 extremities, completely nonverbal.     Data Reviewed: Metabolic panel shows sodium down to 149, potassium 2.9 Creatinine normal Hemoglobin 10.8, stable  Family Communication: Called to daughter, no answer    Disposition: Status is: Inpatient         Author: Alberteen Sam, MD 02/02/2023 1:36 PM  For on call review [email or web address].

## 2023-02-02 NOTE — Transfer of Care (Signed)
Immediate Anesthesia Transfer of Care Note  Patient: [name]  Procedure(s) Performed: LAPAROSCOPIC INSERTION GASTROSTOMY TUBE  Patient Location: PACU  Anesthesia Type:General  Level of Consciousness: awake, alert , and patient cooperative  Airway & Oxygen Therapy: Patient Spontanous Breathing and Patient connected to face mask oxygen  Post-op Assessment: Report given to RN and Post -op Vital signs reviewed and stable  Post vital signs: Reviewed and stable  Last Vitals:  Vitals Value Taken Time  BP 132/80 02/02/23 1354  Temp 36.4 C 02/02/23 1354  Pulse 75 02/02/23 1400  Resp 13 02/02/23 1400  SpO2 100 % 02/02/23 1400  Vitals shown include unfiled device data.  Last Pain:  Vitals:   02/02/23 1150  TempSrc: Oral  PainSc:       Patients Stated Pain Goal: 0 (01/31/23 2000)  Complications: No notable events documented.

## 2023-02-02 NOTE — Progress Notes (Signed)
PT is sleeping, CPT not completed at this time. RN and ordering MD aware. Spoke with ordering MD- we may make changes to CPT.

## 2023-02-02 NOTE — Progress Notes (Signed)
SLP Cancellation Note  Patient Details Name: [name] MRN: [id number] DOB: 03/17/1981   Cancelled treatment:       Reason Eval/Treat Not Completed: Patient at procedure or test/unavailable (getting g tube today). Will f/u as able.    Mahala Menghini., M.A. CCC-SLP Acute Rehabilitation Services Office [phone]  Secure chat preferred  02/02/2023, 11:45 AM

## 2023-02-02 NOTE — Progress Notes (Signed)
  Echocardiogram 2D Echocardiogram has been performed.  [name] 02/02/2023, 9:49 AM

## 2023-02-02 NOTE — Progress Notes (Signed)
[name] [address] Hospitalized Hospice Patient   Mr. Archie Shea is a current hospice patient followed for terminal diagnosis of unspecified severe protein calorie malnutrition. Mr. Ramseyer was sent to the ED for evaluation when found at facility to be short of breath with oxygen saturation of 68%. [facility] was notified by Inpatient provider. Patient is admitted to [facility] with diagnosis of aspiration pneumonia. Per Dr. Patric Dykes, hospice physician, this is a hospice related hospital admission.   Exchanged report with bedside nurse who states patient is scheduled for PEG placement now with consents just signed by family. Visited patient quickly before transported to procedure, patient is alert and able to communicate with nonverbal signs in NAD. Patient seems at peace with procedure at this time. Made patient aware that [facility] will continue to visit him daily during this hospitalization.  Spoke with patient brother [name]) by phone to support who appreciated the call from hospice.   Patient remains GIP appropriate due to need for IV fluids, IV electrolyte replacement, and IV antibiotics and placement of PEG tube procedure.   Vital Signs: 98.1, 98, 16, 137/98, O2 98% on RA  I&O:  1150 output noted in chart  Abnormal labs: RBC 3,80, Hemo 10.8, HCT 35.6, Na 149, K 2.9, Chl 122, Create 0.51, Cal 7.9, Glucose 151   Diagnostics: No new  IV/PRN Meds: Unasyn 3G IV every 6 hours, D5 with KCL at 100   EPIC Problem List:  No new Hospitalist notes at time of visit. [facility] Notes:   42 yo male with aspiration pneumonia and progressive MS. -I discussed feeding tube placement with his brother Rocky Link who is the involved decision maker. -We will proceed with g tube insertion today  I reviewed last 24 h vitals and pain scores, last 48 h intake and output, last 24 h labs and trends, and last 24 h imaging results.  This care required high  level  of medical decision making.  Subjective Assessment: No acute changes  LOS: 2 days    Discharge Planning: Ongoing - will continue to follow to anticipate discharge needs.  Should patient need ambulance transfer at discharge- please use [facility]) as they contract this service for our active hospice patients  Family Contact: Spoke with brother by phone to support IDT: Publishing copy Updated  Goals of Care: Patient is Full Code at this time  Please feel free to reach out for any hospice related concerns,  Roda Shutters, [facility] Liaison for [employer] [phone]

## 2023-02-02 NOTE — Anesthesia Postprocedure Evaluation (Signed)
Anesthesia Post Note  Patient: [name]  Procedure(s) Performed: LAPAROSCOPIC INSERTION GASTROSTOMY TUBE     Patient location during evaluation: PACU Anesthesia Type: General Level of consciousness: awake and alert Pain management: pain level controlled Vital Signs Assessment: post-procedure vital signs reviewed and stable Respiratory status: spontaneous breathing, nonlabored ventilation, respiratory function stable and patient connected to nasal cannula oxygen Cardiovascular status: blood pressure returned to baseline and stable Postop Assessment: no apparent nausea or vomiting Anesthetic complications: no   No notable events documented.  Last Vitals:  Vitals:   02/02/23 1415 02/02/23 1430  BP: (!) 146/84 127/89  Pulse: 74 69  Resp: 11 12  Temp:  (!) 36.4 C  SpO2: 98% 98%    Last Pain:  Vitals:   02/02/23 1430  TempSrc:   PainSc: 0-No pain                 Earl Lites P Lynzi Meulemans

## 2023-02-02 NOTE — Op Note (Signed)
Preoperative diagnosis: dysphagia  Postoperative diagnosis: same   Procedure: laparoscopic gastrostomy tube insertion, upper endoscopy  Surgeon: Feliciana Rossetti, M.D.  Asst: Trixie Deis, [facility]  Anesthesia: GETA  Indications for procedure: [name] is a 42 y.o. year old male with symptoms of dysphagia with aspiration pneumonia. After discussion of options, he was consented for g tube insertion.  Description of procedure: The patient was brought into the operative suite. Anesthesia was administered with General endotracheal anesthesia. WHO checklist was applied. The patient was then placed in supine position. The area was prepped and draped in the usual sterile fashion.  Upper endoscopy was performed. The scope was easily introduced into the oral pharynx and into the esophagus. The stomach was intubated and distended. There was a moderate amount of bilious liquid. The stomach was tortuous and led to paradoxical movement of the endoscopy and unable to reach the antrum well. Transillumination was attempted without success. Decision was made to convert to laparoscopy.  Next, a transverse incision was in the right subcostal area. A 5mm trocar was used to gain access to the peritoneal cavity by optical entry technique. Pneumoperitoneum was applied with high flow and low pressure and laparoscope was reinserted to confirm placement.  On initial evaluation of the abdomen, The colon was moderately distended. 1 8 mm trocar was placed in the right mid abdomen. 1 5 mm trocar was placed in the left periumbilical space. The stomach was identified. There was no hiatal hernia, the stomach seemed to swing more lateral than normal.  Next, the distal stomach body was identified and appeared to reach the left upper quadrant abdominal wall. A pursestring with 3-0 silk was made in the distal body. A gastrotomy was performed in the center A gastrostomy was created with cautery. A 20 french gastrostomy tube was  inserted and balloon filled with 10 ml of saline. 2 3-0 silks were passed through the distal body of the stomach and sutured to the abdominal wall using a suture passer. The gastrostomy balloon was pulled up to the abdominal wall and was noted at 3 cm at the skin level. Pneumoperitoneum was removed. Trocars were removed. All trocars sites were closed with 4-0 monocryl subcuticular sutures. The patient tolerated the procedure well. All counts were correct.  Findings: tortuous stomach, able to get successful laparoscopic g tube  Specimen: none  Implant: 20 Fr g tube   Blood loss: 20 ml  Local anesthesia:  20 ml marcaine   Complications: none  Feliciana Rossetti, M.D. General, Bariatric, & Minimally Invasive Surgery [facility]

## 2023-02-02 NOTE — Assessment & Plan Note (Signed)
related to chronic illness (severe multiple sclerosis) as evidenced by moderate fat depletion, moderate muscle depletion.

## 2023-02-03 ENCOUNTER — Encounter (HOSPITAL_COMMUNITY): Payer: Self-pay | Admitting: General Surgery

## 2023-02-03 DIAGNOSIS — J9601 Acute respiratory failure with hypoxia: Secondary | ICD-10-CM | POA: Diagnosis not present

## 2023-02-03 DIAGNOSIS — E870 Hyperosmolality and hypernatremia: Secondary | ICD-10-CM | POA: Diagnosis not present

## 2023-02-03 DIAGNOSIS — R1310 Dysphagia, unspecified: Secondary | ICD-10-CM | POA: Diagnosis not present

## 2023-02-03 DIAGNOSIS — A419 Sepsis, unspecified organism: Secondary | ICD-10-CM | POA: Diagnosis not present

## 2023-02-03 LAB — MAGNESIUM: Magnesium: 1.7 mg/dL (ref 1.7–2.4)

## 2023-02-03 LAB — COMPREHENSIVE METABOLIC PANEL
ALT: 18 U/L (ref 0–44)
AST: 22 U/L (ref 15–41)
Albumin: 2.4 g/dL — ABNORMAL LOW (ref 3.5–5.0)
Alkaline Phosphatase: 52 U/L (ref 38–126)
Anion gap: 8 (ref 5–15)
BUN: 6 mg/dL (ref 6–20)
CO2: 26 mmol/L (ref 22–32)
Calcium: 8.1 mg/dL — ABNORMAL LOW (ref 8.9–10.3)
Chloride: 111 mmol/L (ref 98–111)
Creatinine, Ser: 0.52 mg/dL — ABNORMAL LOW (ref 0.61–1.24)
GFR, Estimated: >60 mL/min (ref >60)
Glucose, Bld: 143 mg/dL — ABNORMAL HIGH (ref 70–99)
Potassium: 3.1 mmol/L — ABNORMAL LOW (ref 3.5–5.1)
Sodium: 145 mmol/L (ref 135–145)
Total Bilirubin: 0.5 mg/dL (ref 0.3–1.2)
Total Protein: 5.8 g/dL — ABNORMAL LOW (ref 6.5–8.1)

## 2023-02-03 LAB — CBC
HCT: 36 % — ABNORMAL LOW (ref 39.0–52.0)
Hemoglobin: 11.2 g/dL — ABNORMAL LOW (ref 13.0–17.0)
MCH: 28.5 pg (ref 26.0–34.0)
MCHC: 31.1 g/dL (ref 30.0–36.0)
MCV: 91.6 fL (ref 80.0–100.0)
Platelets: 214 10*3/uL (ref 150–400)
RBC: 3.93 MIL/uL — ABNORMAL LOW (ref 4.22–5.81)
RDW: 13.7 % (ref 11.5–15.5)
WBC: 7.1 10*3/uL (ref 4.0–10.5)
nRBC: 0 % (ref 0.0–0.2)

## 2023-02-03 LAB — GLUCOSE, CAPILLARY
Glucose-Capillary: 106 mg/dL — ABNORMAL HIGH (ref 70–99)
Glucose-Capillary: 113 mg/dL — ABNORMAL HIGH (ref 70–99)
Glucose-Capillary: 121 mg/dL — ABNORMAL HIGH (ref 70–99)
Glucose-Capillary: 125 mg/dL — ABNORMAL HIGH (ref 70–99)
Glucose-Capillary: 136 mg/dL — ABNORMAL HIGH (ref 70–99)
Glucose-Capillary: 148 mg/dL — ABNORMAL HIGH (ref 70–99)
Glucose-Capillary: 189 mg/dL — ABNORMAL HIGH (ref 70–99)

## 2023-02-03 LAB — PHOSPHORUS: Phosphorus: 1.6 mg/dL — ABNORMAL LOW (ref 2.5–4.6)

## 2023-02-03 MED ORDER — K PHOS MONO-SOD PHOS DI & MONO 155-852-130 MG PO TABS
500.0000 mg | ORAL_TABLET | Freq: Four times a day (QID) | ORAL | Status: AC
  Administered 2023-02-03 (×4): 500 mg
  Filled 2023-02-03 (×5): qty 2

## 2023-02-03 MED ORDER — OSMOLITE 1.2 CAL PO LIQD
1000.0000 mL | ORAL | Status: DC
  Administered 2023-02-03 – 2023-02-04 (×2): 1000 mL

## 2023-02-03 MED ORDER — OSMOLITE 1.2 CAL PO LIQD: 1000.0000 mL | ORAL | Status: DC

## 2023-02-03 MED ORDER — POTASSIUM CHLORIDE 20 MEQ PO PACK
60.0000 meq | PACK | Freq: Once | ORAL | Status: AC
  Administered 2023-02-03: 60 meq
  Filled 2023-02-03: qty 3

## 2023-02-03 MED ORDER — FREE WATER
200.0000 mL | Freq: Four times a day (QID) | Status: DC
  Administered 2023-02-03 – 2023-02-04 (×3): 200 mL

## 2023-02-03 NOTE — Progress Notes (Signed)
  Progress Note   Patient: [name] ZOX:[id number] DOB: 03-20-1981 DOA: 01/31/2023     3 DOS: the patient was seen and examined on 02/03/2023 at 11:40 AM      Brief hospital course: Mr. Dilauro is a 42 y.o. M with hx relapsing remitting MS, not currently on disease specific therapy, wheelchair bound and nonverbal at baseline, lives at [facility], also HTN who presented with apparent respiratory distress.  SpO2 68% with EMS, tachypneic to 30s in ER, placed on BiPAP, found to have subsegmental PE and bilateral aspiration pneumonia.       Assessment and Plan: * Severe sepsis due to aspiration pneumonia (HCC) Continue Unasyn day 4 of 5    Dysphagia Refeeding syndrome Status post PEG placement 10/4 by Dr. Alvan Dame. Phosphorus and potassium down today. - Increase tube feed rate slowly - Start free water - Supplement potassium and phosphorus  Hypernatremia Resolved with hypotonic fluids  Malnutrition of moderate degree related to chronic illness (severe multiple sclerosis) as evidenced by moderate fat depletion, moderate muscle depletion.   Single subsegmental pulmonary embolism without acute cor pulmonale (HCC) Isolated small PE.  Likely asymptomatic.  Very low risk. -Resume Lovenox  Multiple sclerosis (HCC) See prior notes           Subjective: Patient is nonverbal, but he shakes his head no when asked about pain, concerns.  No nursing concerns.  No fever, no bleeding.     Physical Exam: BP (!) 142/89 (BP Location: Right Arm)   Pulse 87   Temp 98.4 F (36.9 C) (Oral)   Resp 16   Ht 5\' 10"  (1.778 m)   Wt 78.5 kg   SpO2 100%   BMI 24.82 kg/m   Thin chronically ill-appearing adult male, lying in bed RRR, no murmurs, no peripheral edema Respiratory rate normal, lung sounds diminished Abdomen soft without grimace to palpation Attention normal, nods his head and shakes his head yes and no appropriately, contractures in all 4 extremities,  nonverbal     Data Reviewed: Patient metabolic panel shows resolved hypernatremia, low potassium and phosphorus CBC improving  Family Communication: None present    Disposition: Status is: Inpatient         Author: Alberteen Sam, MD 02/03/2023 3:34 PM  For on call review [email or web address].

## 2023-02-03 NOTE — Progress Notes (Signed)
1 Day Post-Op   Subjective/Chief Complaint: No complaints Tolerating g-tube feeds at 10 ml/hr   Objective: Vital signs in last 24 hours: Temp:  [97.5 F (36.4 C)-99.6 F (37.6 C)] 98.9 F (37.2 C) (10/05 0424) Pulse Rate:  [69-110] 110 (10/05 0424) Resp:  [11-18] 14 (10/05 0424) BP: (127-158)/(71-96) 139/71 (10/05 0424) SpO2:  [97 %-100 %] 97 % (10/05 0424) Weight:  [78.5 kg] 78.5 kg (10/04 1138) Last BM Date :  (PTA)  Intake/Output from previous day: 10/04 0701 - 10/05 0700 In: 3158.9 [I.V.:2374.4; NG/GT:101.2; IV Piggyback:683.3] Out: 760 [Urine:750; Blood:10] Intake/Output this shift: No intake/output data recorded.  Incisions c/d/I No abdominal tenderness G-tube site clean; tube is functioning  Lab Results:  Recent Labs    02/02/23 0549 02/03/23 0609  WBC 6.2 7.1  HGB 10.8* 11.2*  HCT 35.6* 36.0*  PLT 210 214   BMET Recent Labs    02/02/23 0549 02/03/23 0609  NA 149* 145  K 2.9* 3.1*  CL 121* 111  CO2 22 26  GLUCOSE 151* 143*  BUN 9 6  CREATININE 0.51* 0.52*  CALCIUM 7.9* 8.1*   PT/INR Recent Labs    01/31/23 1436  LABPROT 17.6*  INR 1.4*   ABG Recent Labs    01/31/23 1436  HCO3 26.7    Studies/Results: DG Abd 1 View  Result Date: 02/02/2023 CLINICAL DATA:  G-tube EXAM: ABDOMEN - 1 VIEW COMPARISON:  CT 01/31/2023 FINDINGS: Nonobstructed gas pattern with mild air-filled small bowel and gas in the colon. Interim placement of gastrostomy tube projecting over the body of stomach. IMPRESSION: Interim placement of gastrostomy tube projecting over the body of stomach. Electronically Signed   By: Jasmine Pang M.D.   On: 02/02/2023 19:43   ECHOCARDIOGRAM COMPLETE  Result Date: 02/02/2023    ECHOCARDIOGRAM REPORT   Patient Name:   [name] Date of Exam: 02/02/2023 Medical Rec #:  [id number]     Height:       70.0 in Accession #:    [id number]    Weight:       173.0 lb Date of Birth:  [birth year]     BSA:          1.963 m Patient Age:    42  years      BP:           137/88 mmHg Patient Gender: M             HR:           96 bpm. Exam Location:  Inpatient Procedure: 2D Echo, Cardiac Doppler and Color Doppler Indications:    pulmonary emboli  History:        Patient has no prior history of Echocardiogram examinations.                 Signs/Symptoms:sepsis; Risk Factors:Hypertension.  Sonographer:    Milda Smart Referring Phys: [id number] MIR M [facility] IMPRESSIONS  1. Left ventricular ejection fraction, by estimation, is 60 to 65%. The left ventricle has normal function. The left ventricle has no regional wall motion abnormalities. Left ventricular diastolic parameters are indeterminate.  2. Right ventricular systolic function is normal. The right ventricular size is normal. Tricuspid regurgitation signal is inadequate for assessing PA pressure.  3. The mitral valve is normal in structure. No evidence of mitral valve regurgitation. No evidence of mitral stenosis.  4. The aortic valve is normal in structure. Aortic valve regurgitation is not visualized. No aortic stenosis is present.  5.  The inferior vena cava is normal in size with greater than 50% respiratory variability, suggesting right atrial pressure of 3 mmHg. FINDINGS  Left Ventricle: Left ventricular ejection fraction, by estimation, is 60 to 65%. The left ventricle has normal function. The left ventricle has no regional wall motion abnormalities. The left ventricular internal cavity size was normal in size. There is  no left ventricular hypertrophy. Left ventricular diastolic parameters are indeterminate. Right Ventricle: The right ventricular size is normal. No increase in right ventricular wall thickness. Right ventricular systolic function is normal. Tricuspid regurgitation signal is inadequate for assessing PA pressure. Left Atrium: Left atrial size was normal in size. Right Atrium: Right atrial size was normal in size. Pericardium: There is no evidence of pericardial effusion. Mitral  Valve: The mitral valve is normal in structure. No evidence of mitral valve regurgitation. No evidence of mitral valve stenosis. Tricuspid Valve: The tricuspid valve is normal in structure. Tricuspid valve regurgitation is not demonstrated. No evidence of tricuspid stenosis. Aortic Valve: The aortic valve is normal in structure. Aortic valve regurgitation is not visualized. No aortic stenosis is present. Pulmonic Valve: The pulmonic valve was normal in structure. Pulmonic valve regurgitation is not visualized. No evidence of pulmonic stenosis. Aorta: The aortic root is normal in size and structure. Venous: The inferior vena cava is normal in size with greater than 50% respiratory variability, suggesting right atrial pressure of 3 mmHg. IAS/Shunts: No atrial level shunt detected by color flow Doppler.  LEFT VENTRICLE PLAX 2D LVIDd:         3.40 cm     Diastology LVIDs:         2.70 cm     LV e' medial:  6.64 cm/s LV PW:         0.90 cm     LV e' lateral: 9.57 cm/s LV IVS:        0.90 cm LVOT diam:     2.10 cm LV SV:         50 LV SV Index:   25 LVOT Area:     3.46 cm  LV Volumes (MOD) LV vol d, MOD A2C: 44.6 ml LV vol d, MOD A4C: 48.9 ml LV vol s, MOD A2C: 17.8 ml LV vol s, MOD A4C: 16.7 ml LV SV MOD A2C:     26.8 ml LV SV MOD A4C:     48.9 ml LV SV MOD BP:      30.0 ml RIGHT VENTRICLE RV Basal diam:  2.10 cm RV S prime:     12.80 cm/s TAPSE (M-mode): 2.1 cm LEFT ATRIUM             Index        RIGHT ATRIUM          Index LA diam:        1.80 cm 0.92 cm/m   RA Area:     6.94 cm LA Vol (A2C):   21.0 ml 10.70 ml/m  RA Volume:   9.90 ml  5.04 ml/m LA Vol (A4C):   15.3 ml 7.80 ml/m LA Biplane Vol: 17.8 ml 9.07 ml/m  AORTIC VALVE LVOT Vmax:   83.00 cm/s LVOT Vmean:  64.700 cm/s LVOT VTI:    0.143 m  AORTA Ao Root diam: 3.00 cm  SHUNTS Systemic VTI:  0.14 m Systemic Diam: 2.10 cm Kardie Tobb DO Electronically signed by Thomasene Ripple DO Signature Date/Time: 02/02/2023/1:16:04 PM    Final      Anti-infectives: Anti-infectives (From  admission, onward)    Start     Dose/Rate Route Frequency Ordered Stop   01/31/23 1900  Ampicillin-Sulbactam (UNASYN) 3 g in sodium chloride 0.9 % 100 mL IVPB        3 g 200 mL/hr over 30 Minutes Intravenous Every 6 hours 01/31/23 1849     01/31/23 1500  vancomycin (VANCOREADY) IVPB 1750 mg/350 mL        1,750 mg 175 mL/hr over 120 Minutes Intravenous  Once 01/31/23 1448 01/31/23 1900   01/31/23 1445  cefTRIAXone (ROCEPHIN) 1 g in sodium chloride 0.9 % 100 mL IVPB        1 g 200 mL/hr over 30 Minutes Intravenous  Once 01/31/23 1439 01/31/23 1753   01/31/23 1445  azithromycin (ZITHROMAX) 500 mg in sodium chloride 0.9 % 250 mL IVPB        500 mg 250 mL/hr over 60 Minutes Intravenous  Once 01/31/23 1439 01/31/23 1753       Assessment/Plan: s/p Procedure(s): LAPAROSCOPIC INSERTION GASTROSTOMY TUBE (N/A) Advance tube feeds as tolerated to goal rate We will sign off for now.    Follow-up with Dr. Sheliah Hatch as outpatient.  LOS: 3 days    Wynona Luna 02/03/2023

## 2023-02-03 NOTE — Progress Notes (Signed)
[name] 1884- [facility] hospitalized hospice patient visit  [name] is a current hospice patient followed for terminal diagnosis of unspecified severe protein calorie malnutrition. [name] was sent to the ED for evaluation when found at facility to be short of breath with oxygen saturation of 68%. [facility] was notified by Inpatient provider. Patient is admitted to [facility] with diagnosis of aspiration pneumonia. Per Dr. Patric Grant, hospice physician, this is a hospice related hospital admission.    Visited with patient in the hospital, family not present. He is nonverbal but does not show any outward signs of distress. They currently have a continuous tube feed running at 25ml/H and plan to titrate up slowly. He is also still receiving IVAB.  He remains inpatient appropriate with need for IVAB and higher level skilled intervention.    Vital Signs: 98.4/105/16   spO2 100% room air I&O: 1689/350 Abnormal labs: K+ 3.1, Ca+ 8.1, Phos 1.6, Albumin 2.4, RBC 3.93, Hgb 11.2, Hct 36 Diagnostics:  CLINICAL DATA:  G-tube IMPRESSION: Interim placement of gastrostomy tube projecting over the body of stomach.  Electronically Signed   By: [name] M.D.   On: 02/02/2023 19:43  IV/PRN Meds: Unasyn 3g IV q6H, D5 @100ml /H  Problem List: Severe sepsis due to aspiration pneumonia (HCC) Continue Unasyn day 4 of 5   Dysphagia Refeeding syndrome Status post PEG placement 10/4 by Dr. Alvan Grant. Phosphorus and potassium down today. - Increase tube feed rate slowly - Start free water - Supplement potassium and phosphorus   Hypernatremia Resolved with hypotonic fluids   Malnutrition of moderate degree related to chronic illness (severe multiple sclerosis) as evidenced by moderate fat depletion, moderate muscle depletion.    Single subsegmental pulmonary embolism without acute cor pulmonale (HCC) Isolated small PE.  Likely asymptomatic.  Very low risk. -Resume  Lovenox  Discharge Planning: Ongoing, back to facility once medically stable  Family Contact: Left VM for brother IDT: Updated Goals of Care: Full Should patient need ambulance transfer at discharge- please use [name]) as they contract this service for our active hospice patients. Thea Gist, BSN RN Hospice hospital liaison [phone]

## 2023-02-03 NOTE — Plan of Care (Signed)
  Problem: Clinical Measurements: Goal: Will remain free from infection Outcome: Progressing Goal: Respiratory complications will improve Outcome: Progressing   Problem: Clinical Measurements: Goal: Will remain free from infection Outcome: Progressing   Problem: Clinical Measurements: Goal: Respiratory complications will improve Outcome: Progressing   Problem: Activity: Goal: Risk for activity intolerance will decrease Outcome: Progressing   Problem: Activity: Goal: Risk for activity intolerance will decrease Outcome: Progressing   Problem: Nutrition: Goal: Adequate nutrition will be maintained Outcome: Progressing   Problem: Nutrition: Goal: Adequate nutrition will be maintained Outcome: Progressing   Problem: Coping: Goal: Level of anxiety will decrease Outcome: Progressing   Problem: Coping: Goal: Level of anxiety will decrease Outcome: Progressing   Problem: Coping: Goal: Level of anxiety will decrease Outcome: Progressing   Problem: Elimination: Goal: Will not experience complications related to bowel motility Outcome: Progressing   Problem: Elimination: Goal: Will not experience complications related to bowel motility Outcome: Progressing   Problem: Safety: Goal: Ability to remain free from injury will improve Outcome: Progressing   Problem: Safety: Goal: Ability to remain free from injury will improve Outcome: Progressing   Problem: Skin Integrity: Goal: Risk for impaired skin integrity will decrease Outcome: Progressing   Problem: Skin Integrity: Goal: Risk for impaired skin integrity will decrease Outcome: Progressing

## 2023-02-04 DIAGNOSIS — J9601 Acute respiratory failure with hypoxia: Secondary | ICD-10-CM | POA: Diagnosis not present

## 2023-02-04 DIAGNOSIS — A419 Sepsis, unspecified organism: Secondary | ICD-10-CM | POA: Diagnosis not present

## 2023-02-04 DIAGNOSIS — E870 Hyperosmolality and hypernatremia: Secondary | ICD-10-CM | POA: Diagnosis not present

## 2023-02-04 DIAGNOSIS — R1310 Dysphagia, unspecified: Secondary | ICD-10-CM | POA: Diagnosis not present

## 2023-02-04 LAB — CBC
HCT: 36.3 % — ABNORMAL LOW (ref 39.0–52.0)
Hemoglobin: 11.4 g/dL — ABNORMAL LOW (ref 13.0–17.0)
MCH: 28.4 pg (ref 26.0–34.0)
MCHC: 31.4 g/dL (ref 30.0–36.0)
MCV: 90.3 fL (ref 80.0–100.0)
Platelets: 222 10*3/uL (ref 150–400)
RBC: 4.02 MIL/uL — ABNORMAL LOW (ref 4.22–5.81)
RDW: 13.9 % (ref 11.5–15.5)
WBC: 5.3 10*3/uL (ref 4.0–10.5)
nRBC: 0 % (ref 0.0–0.2)

## 2023-02-04 LAB — RENAL FUNCTION PANEL
Albumin: 2.6 g/dL — ABNORMAL LOW (ref 3.5–5.0)
Anion gap: 10 (ref 5–15)
BUN: 6 mg/dL (ref 6–20)
CO2: 25 mmol/L (ref 22–32)
Calcium: 8.3 mg/dL — ABNORMAL LOW (ref 8.9–10.3)
Chloride: 109 mmol/L (ref 98–111)
Creatinine, Ser: 0.44 mg/dL — ABNORMAL LOW (ref 0.61–1.24)
GFR, Estimated: >60 mL/min (ref >60)
Glucose, Bld: 122 mg/dL — ABNORMAL HIGH (ref 70–99)
Phosphorus: 2.9 mg/dL (ref 2.5–4.6)
Potassium: 3.3 mmol/L — ABNORMAL LOW (ref 3.5–5.1)
Sodium: 144 mmol/L (ref 135–145)

## 2023-02-04 LAB — GLUCOSE, CAPILLARY
Glucose-Capillary: 126 mg/dL — ABNORMAL HIGH (ref 70–99)
Glucose-Capillary: 118 mg/dL — ABNORMAL HIGH (ref 70–99)
Glucose-Capillary: 109 mg/dL — ABNORMAL HIGH (ref 70–99)
Glucose-Capillary: 110 mg/dL — ABNORMAL HIGH (ref 70–99)
Glucose-Capillary: 142 mg/dL — ABNORMAL HIGH (ref 70–99)

## 2023-02-04 LAB — MAGNESIUM: Magnesium: 1.8 mg/dL (ref 1.7–2.4)

## 2023-02-04 MED ORDER — APIXABAN 5 MG PO TABS
10.0000 mg | ORAL_TABLET | Freq: Two times a day (BID) | ORAL | Status: DC
Start: 2023-02-04 — End: 2023-02-04

## 2023-02-04 MED ORDER — OSMOLITE 1.5 CAL PO LIQD
1000.0000 mL | ORAL | Status: DC
  Administered 2023-02-04: 1000 mL
  Filled 2023-02-04: qty 1000

## 2023-02-04 MED ORDER — AMOXICILLIN-POT CLAVULANATE 875-125 MG PO TABS
1.0000 | ORAL_TABLET | Freq: Two times a day (BID) | ORAL | Status: AC
  Administered 2023-02-04 (×2): 1
  Filled 2023-02-04 (×2): qty 1

## 2023-02-04 MED ORDER — POTASSIUM CHLORIDE 20 MEQ PO PACK
40.0000 meq | PACK | Freq: Two times a day (BID) | ORAL | Status: AC
  Administered 2023-02-04 (×2): 40 meq via ORAL
  Filled 2023-02-04 (×2): qty 2

## 2023-02-04 MED ORDER — OSMOLITE 1.5 CAL PO LIQD
1000.0000 mL | ORAL | Status: DC
  Administered 2023-02-05: 1000 mL
  Filled 2023-02-04: qty 1000

## 2023-02-04 MED ORDER — PROSOURCE TF20 ENFIT COMPATIBL EN LIQD
60.0000 mL | Freq: Two times a day (BID) | ENTERAL | Status: DC
  Administered 2023-02-04 – 2023-02-05 (×3): 60 mL
  Filled 2023-02-04 (×4): qty 60

## 2023-02-04 MED ORDER — APIXABAN 5 MG PO TABS
5.0000 mg | ORAL_TABLET | Freq: Two times a day (BID) | ORAL | Status: DC
Start: 2023-02-11 — End: 2023-02-04

## 2023-02-04 MED ORDER — APIXABAN 5 MG PO TABS: 5.0000 mg | ORAL_TABLET | Freq: Two times a day (BID) | ORAL | Status: DC

## 2023-02-04 MED ORDER — APIXABAN 5 MG PO TABS
10.0000 mg | ORAL_TABLET | Freq: Two times a day (BID) | ORAL | Status: DC
  Administered 2023-02-04: 10 mg
  Filled 2023-02-04: qty 2

## 2023-02-04 MED ORDER — FREE WATER
200.0000 mL | Status: DC
  Administered 2023-02-04 – 2023-02-05 (×6): 200 mL

## 2023-02-04 NOTE — Progress Notes (Signed)
Patient didn't have much output yesterday and today. Bladder scan showed 279 ml, foley catheter flushed and output was 150 ml.

## 2023-02-04 NOTE — Progress Notes (Signed)
[name] 1324- [facility] hospitalized hospice patient visit   [name] is a current hospice patient followed for terminal diagnosis of unspecified severe protein calorie malnutrition. Mr. Camerer was sent to the ED for evaluation when found at facility to be short of breath with oxygen saturation of 68%. [facility] was notified by Inpatient provider. Patient is admitted to [facility] with diagnosis of aspiration pneumonia. Per Dr. Patric Dykes, hospice physician, this is a hospice related hospital admission.     Visited with patient in the hospital, family not present. Patient is nonverbal due to disease process but denies pain or discomfort.  Requested to sit up.  Tube feedings increased per RN with goal rate of 60cc/hour.  Per RN, patient currently at 40cc/hr and if patient tolerates to goal rate with no issues possibility of discharge to facility tomorrow.  Spoke with patient's brother [name] via phone to update him.  He is appreciative of update with no other needs identified.   Vital Signs: 98.3/77/18   spO2 100% room air I&O: 3158/760 Abnormal labs: RBC 4.02, Hgb 11.4, HCT 36.3, Potassium 3.3, Creatinine 0.33, Calcium 8.3, Glucose 122 Diagnostics: none new   IV/PRN Meds: Unasyn 3g IV q6H   Problem List: Assessment and Plan: * Severe sepsis due to aspiration pneumonia (HCC) Completing 5 days of antibiotics today with Augmentin       Dysphagia Status post PEG placement 10/4 with Dr. Alvan Dame Mild refeeding syndrome, resolved See summary from prior, phosphorus normal now, potassium repleted. - Increase tube feed rate - Continue free water - Start Prosource      Hypernatremia Resolved     Single subsegmental pulmonary embolism without acute cor pulmonale (HCC) -Transition to Eliquis per tube today     Hypertension Blood pressure normal   Multiple sclerosis (HCC) Advanced disability.  Nonverbal, wheelchair bound.  Follows with Dr. Malvin Johns in  [place].  Not on disease specific therapy at present.     Discharge Planning: Ongoing, possible discharge tomorrow if medically stable Family Contact: Spoke to brother via phone to update IDT: Updated Goals of Care: Full, discharge back to facility tomorrow. Should patient need ambulance transfer at discharge- please use [facility]) as they contract this service for our active hospice patients.  Doreatha Martin, RN, [facility] [phone]

## 2023-02-04 NOTE — Progress Notes (Signed)
  Progress Note   Patient: [name] ZOX:[id number] DOB: 27-Oct-1980 DOA: 01/31/2023     4 DOS: the patient was seen and examined on 02/04/2023 at 9:04AM      Brief hospital course: [name] is a 42 y.o. M with hx relapsing remitting MS, not currently on disease specific therapy, wheelchair bound and nonverbal at baseline, lives at [facility], also HTN who presented with apparent respiratory distress.  SpO2 68% with EMS, tachypneic to 30s in ER, placed on BiPAP, found to have subsegmental PE and bilateral aspiration pneumonia.       Assessment and Plan: * Severe sepsis due to aspiration pneumonia (HCC) Completing 5 days of antibiotics today with Augmentin    Dysphagia Status post PEG placement 10/4 with Dr. Alvan Dame Mild refeeding syndrome, resolved See summary from prior, phosphorus normal now, potassium repleted. - Increase tube feed rate - Continue free water - Start Prosource    Hypernatremia Resolved    Single subsegmental pulmonary embolism without acute cor pulmonale (HCC) -Transition to Eliquis per tube today    Hypertension Blood pressure normal  Multiple sclerosis (HCC) Advanced disability.  Nonverbal, wheelchair bound.  Follows with Dr. Malvin Johns in [place].  Not on disease specific therapy at present.           Subjective: No complaints, no nursing concerns, no fever, no respiratory distress     Physical Exam: BP (!) 147/92 (BP Location: Right Arm)   Pulse 80   Temp 98.4 F (36.9 C) (Oral)   Resp 16   Ht 5\' 10"  (1.778 m)   Wt 78.5 kg   SpO2 100%   BMI 24.82 kg/m   Thin chronically ill adult male, lying in bed RRR, no murmurs, no peripheral edema Respiratory normal, lungs clear without rales or wheezes Abdomen soft without grimace to palpation Sleeping but arouses and makes eye contact, nods head, then falls back asleep, contractures in all 4 extremities, nonverbal    Data Reviewed: Basic metabolic panel shows  potassium up to 3.3, sodium 144, creatinine normal Magnesium and phosphate normal  Family Communication: Spoke with sister-in-law last night    Disposition: Status is: Inpatient         Author: Alberteen Sam, MD 02/04/2023 11:58 AM  For on call review [email or web address].

## 2023-02-05 ENCOUNTER — Other Ambulatory Visit: Payer: Self-pay

## 2023-02-05 DIAGNOSIS — R6520 Severe sepsis without septic shock: Secondary | ICD-10-CM | POA: Diagnosis not present

## 2023-02-05 DIAGNOSIS — A419 Sepsis, unspecified organism: Secondary | ICD-10-CM | POA: Diagnosis not present

## 2023-02-05 LAB — CBC
HCT: 39.3 % (ref 39.0–52.0)
Hemoglobin: 12.4 g/dL — ABNORMAL LOW (ref 13.0–17.0)
MCH: 28.4 pg (ref 26.0–34.0)
MCHC: 31.6 g/dL (ref 30.0–36.0)
MCV: 89.9 fL (ref 80.0–100.0)
Platelets: 239 10*3/uL (ref 150–400)
RBC: 4.37 MIL/uL (ref 4.22–5.81)
RDW: 13.9 % (ref 11.5–15.5)
WBC: 4.4 10*3/uL (ref 4.0–10.5)
nRBC: 0 % (ref 0.0–0.2)

## 2023-02-05 LAB — RENAL FUNCTION PANEL
Albumin: 2.6 g/dL — ABNORMAL LOW (ref 3.5–5.0)
Anion gap: 9 (ref 5–15)
BUN: 9 mg/dL (ref 6–20)
CO2: 29 mmol/L (ref 22–32)
Calcium: 8.8 mg/dL — ABNORMAL LOW (ref 8.9–10.3)
Chloride: 106 mmol/L (ref 98–111)
Creatinine, Ser: 0.46 mg/dL — ABNORMAL LOW (ref 0.61–1.24)
GFR, Estimated: >60 mL/min (ref >60)
Glucose, Bld: 142 mg/dL — ABNORMAL HIGH (ref 70–99)
Phosphorus: 2.6 mg/dL (ref 2.5–4.6)
Potassium: 4.2 mmol/L (ref 3.5–5.1)
Sodium: 144 mmol/L (ref 135–145)

## 2023-02-05 LAB — CULTURE, BLOOD (ROUTINE X 2)
Culture: NO GROWTH
Culture: NO GROWTH
Special Requests: ADEQUATE

## 2023-02-05 LAB — MAGNESIUM: Magnesium: 1.8 mg/dL (ref 1.7–2.4)

## 2023-02-05 LAB — GLUCOSE, CAPILLARY
Glucose-Capillary: 146 mg/dL — ABNORMAL HIGH (ref 70–99)
Glucose-Capillary: 151 mg/dL — ABNORMAL HIGH (ref 70–99)
Glucose-Capillary: 153 mg/dL — ABNORMAL HIGH (ref 70–99)
Glucose-Capillary: 154 mg/dL — ABNORMAL HIGH (ref 70–99)

## 2023-02-05 MED ORDER — VITAMIN B-1 100 MG PO TABS: 100.0000 mg | ORAL_TABLET | Freq: Every day | ORAL | Status: AC

## 2023-02-05 MED ORDER — OSMOLITE 1.5 CAL PO LIQD: 1000.0000 mL | ORAL | Status: DC

## 2023-02-05 MED ORDER — PROSOURCE TF20 ENFIT COMPATIBL EN LIQD: 60.0000 mL | Freq: Two times a day (BID) | ENTERAL | Status: DC

## 2023-02-05 MED ORDER — POLYETHYLENE GLYCOL 3350 17 GM/SCOOP PO POWD: 17.0000 g | Freq: Every day | ORAL | 0 refills | Status: DC

## 2023-02-05 MED ORDER — FREE WATER: 200.0000 mL | Status: DC

## 2023-02-05 MED ORDER — APIXABAN 5 MG PO TABS
10.0000 mg | ORAL_TABLET | Freq: Two times a day (BID) | ORAL | Status: DC
  Administered 2023-02-05: 10 mg
  Filled 2023-02-05: qty 2

## 2023-02-05 MED ORDER — GABAPENTIN 250 MG/5ML PO SOLN: 100.0000 mg | Freq: Every day | ORAL | Status: DC

## 2023-02-05 MED ORDER — SERTRALINE HCL 20 MG/ML PO CONC: 100.0000 mg | Freq: Every day | ORAL | 12 refills | Status: DC

## 2023-02-05 MED ORDER — POTASSIUM CHLORIDE 20 MEQ/15ML (10%) PO SOLN: 10.0000 meq | Freq: Every day | ORAL | 0 refills | Status: DC

## 2023-02-05 MED ORDER — APIXABAN 5 MG PO TABS: ORAL_TABLET | ORAL | Status: DC

## 2023-02-05 MED ORDER — APIXABAN 5 MG PO TABS: 5.0000 mg | ORAL_TABLET | Freq: Two times a day (BID) | ORAL | Status: DC

## 2023-02-05 NOTE — TOC Transition Note (Addendum)
Transition of Care [facility]) - CM/SW Discharge Note   Patient Details  Name: [name] MRN: [id number] Date of Birth: Jan 01, 1981  [facility] CM/SW Contact:  Darleene Cleaver, LCSW Phone Number: 02/05/2023, 12:50 PM   Clinical Narrative:     CSW was informed that patient is LTC resident at [facility], and will be discharging today per MD.  CSW contacted Kia at [facility] and she said patient can return today.  CSW notified patient's brother [name], [phone] who is aware patient is discharging today.  Patient is a current hospice patient through [facility], and will need [facility] to transport patient.  Patient to be d/c'ed today to [facility] room 235A.  Patient and family agreeable to plans will transport via ems RN to call report [phone].  CSW notified bedside nurse.  [facility] called at 1:50pm.    Final next level of care: Skilled Nursing Facility Barriers to Discharge: Barriers Resolved   Patient Goals and CMS Choice CMS Medicare.gov Compare Post Acute Care list provided to:: Patient Represenative (must comment) Choice offered to / list presented to : Sibling  Discharge Placement     Existing PASRR number confirmed : 02/05/23          Patient chooses bed at: [facility] Patient to be transferred to facility by: [facility] Name of family member notified: Verlon Setting, [phone] Patient and family notified of of transfer: 02/05/23  Discharge Plan and Services Additional resources added to the After Visit Summary for                                       Social Determinants of Health (SDOH) Interventions SDOH Screenings   Food Insecurity: No Food Insecurity (01/31/2023)  Housing: Low Risk  (01/31/2023)  Transportation Needs: No Transportation Needs (01/31/2023)  Utilities: Not At Risk (01/31/2023)  Tobacco Use: High Risk (02/02/2023)     Readmission Risk  Interventions     No data to display

## 2023-02-05 NOTE — [place] FL2 (Signed)
[place] MEDICAID FL2 LEVEL OF CARE FORM     IDENTIFICATION  Patient Name: [name] Birthdate: 12/26/1980 Sex: male Admission Date (Current Location): 01/31/2023  [place] and [place] Number:  Haynes Bast [id number] Facility and Address:  [facility],  [address] [place], [place] [address]      Provider Number: [id number]  Attending Physician Name and Address:  Alberteen Sam, *  Relative Name and Phone Number:  TRAYCEN, GOYER   [phone]  [facility] Daughter [phone]  [phone]  Sherrill,Katherine Spouse [phone]  [phone]  Funderburke,(SIL)Suzette Relative   [phone]    Current Level of Care: Hospital Recommended Level of Care: Skilled Nursing Facility Prior Approval Number:    Date Approved/Denied:   PASRR Number: [id number]  Discharge Plan: SNF    Current Diagnoses: Patient Active Problem List   Diagnosis Date Noted   Malnutrition of moderate degree 02/02/2023   Acute respiratory failure with hypoxia (HCC) 02/01/2023   Single subsegmental pulmonary embolism without acute cor pulmonale (HCC) 02/01/2023   Hypernatremia 02/01/2023   Hypokalemia 02/01/2023   Dysphagia 02/01/2023   Severe sepsis due to aspiration pneumonia (HCC) 01/31/2023   Muscle spasms of neck    Neuropathic pain    Vitamin B12 deficiency    Hypoalbuminemia    Urinary frequency    Tobacco abuse    Steroid-induced hyperglycemia    Marijuana abuse, continuous 04/12/2017   Vitamin D deficiency 04/12/2017   Multiple sclerosis pseudoexacerbation (HCC)    Abnormality of gait 05/10/2016   Hypertension    Depression    Multiple sclerosis (HCC) 03/02/2011    Orientation RESPIRATION BLADDER Height & Weight     Self, Place  Normal Continent Weight: 173 lb (78.5 kg) Height:  5\' 10"  (177.8 cm)  BEHAVIORAL SYMPTOMS/MOOD NEUROLOGICAL BOWEL NUTRITION STATUS      Incontinent Feeding tube  AMBULATORY STATUS COMMUNICATION OF NEEDS Skin   Extensive Assist  Verbally Normal                       Personal Care Assistance Level of Assistance  Dressing, Feeding, Bathing Bathing Assistance: Maximum assistance Feeding assistance: Limited assistance Dressing Assistance: Maximum assistance     Functional Limitations Info  Sight, Hearing, Speech Sight Info: Adequate Hearing Info: Adequate Speech Info: Adequate    SPECIAL CARE FACTORS FREQUENCY                       Contractures Contractures Info: Not present    Additional Factors Info  Code Status, Allergies, Psychotropic Code Status Info: Full Code Allergies Info: NKA Psychotropic Info: sertraline (ZOLOFT) tablet 100 mg         Current Medications (02/05/2023):  This is the current hospital active medication list Current Facility-Administered Medications  Medication Dose Route Frequency Provider Last Rate Last Admin   acetaminophen (TYLENOL) suppository 650 mg  650 mg Rectal Q6H PRN Kinsinger, De Blanch, MD       albuterol (PROVENTIL) (2.5 MG/3ML) 0.083% nebulizer solution 2.5 mg  2.5 mg Nebulization Q2H PRN Kinsinger, De Blanch, MD       apixaban Everlene Balls) tablet 10 mg  10 mg Per Tube BID Lucia Gaskins, RPH   10 mg at 02/05/23 1050   Followed by   Melene Muller ON 02/11/2023] apixaban (ELIQUIS) tablet 5 mg  5 mg Per Tube BID Pham, Anh P, RPH       bisacodyl (DULCOLAX) suppository 10 mg  10 mg Rectal Daily PRN Kinsinger, De Blanch,  MD       Chlorhexidine Gluconate Cloth 2 % PADS 6 each  6 each Topical Daily Kinsinger, De Blanch, MD   6 each at 02/05/23 1051   feeding supplement (OSMOLITE 1.5 CAL) liquid 1,000 mL  1,000 mL Per Tube Q24H Alberteen Sam, MD   1,000 mL at 02/05/23 1051   feeding supplement (PROSource TF20) liquid 60 mL  60 mL Per Tube BID Alberteen Sam, MD   60 mL at 02/05/23 1050   free water 200 mL  200 mL Per Tube Q4H Danford, Earl Lites, MD   200 mL at 02/05/23 1051   gabapentin (NEURONTIN) 250 MG/5ML solution 100 mg  100 mg Per Tube Daily  Kinsinger, De Blanch, MD   100 mg at 02/05/23 1050   hydrALAZINE (APRESOLINE) injection 5 mg  5 mg Intravenous Q8H PRN Kinsinger, De Blanch, MD       ondansetron [facility]) tablet 4 mg  4 mg Per Tube Q6H PRN Kinsinger, De Blanch, MD       Or   ondansetron [facility]) injection 4 mg  4 mg Intravenous Q6H PRN Kinsinger, De Blanch, MD       sertraline (ZOLOFT) tablet 100 mg  100 mg Per Tube Daily Kinsinger, De Blanch, MD   100 mg at 02/05/23 1050   thiamine (VITAMIN B1) tablet 100 mg  100 mg Per Tube Daily Kinsinger, De Blanch, MD   100 mg at 02/04/23 1808   traZODone (DESYREL) tablet 25 mg  25 mg Per Tube QHS PRN Kinsinger, De Blanch, MD         Discharge Medications: Please see discharge summary for a list of discharge medications.  Relevant Imaging Results:  Relevant Lab Results:   Additional Information SSN [id number]  Darleene Cleaver, LCSW

## 2023-02-05 NOTE — Plan of Care (Signed)
  Problem: Education: Goal: Knowledge of General Education information will improve Description: Including pain rating scale, medication(s)/side effects and non-pharmacologic comfort measures Outcome: Progressing   Problem: Education: Goal: Knowledge of General Education information will improve Description: Including pain rating scale, medication(s)/side effects and non-pharmacologic comfort measures Outcome: Progressing   Problem: Health Behavior/Discharge Planning: Goal: Ability to manage health-related needs will improve Outcome: Progressing   Problem: Health Behavior/Discharge Planning: Goal: Ability to manage health-related needs will improve Outcome: Progressing   Problem: Clinical Measurements: Goal: Ability to maintain clinical measurements within normal limits will improve Outcome: Progressing Goal: Will remain free from infection Outcome: Progressing Goal: Diagnostic test results will improve Outcome: Progressing Goal: Respiratory complications will improve Outcome: Progressing Goal: Cardiovascular complication will be avoided Outcome: Progressing   Problem: Clinical Measurements: Goal: Ability to maintain clinical measurements within normal limits will improve Outcome: Progressing   Problem: Clinical Measurements: Goal: Will remain free from infection Outcome: Progressing   Problem: Clinical Measurements: Goal: Diagnostic test results will improve Outcome: Progressing   Problem: Clinical Measurements: Goal: Respiratory complications will improve Outcome: Progressing   Problem: Clinical Measurements: Goal: Cardiovascular complication will be avoided Outcome: Progressing   Problem: Nutrition: Goal: Adequate nutrition will be maintained Outcome: Progressing   Problem: Nutrition: Goal: Adequate nutrition will be maintained Outcome: Progressing   Problem: Coping: Goal: Level of anxiety will decrease Outcome: Progressing   Problem: Coping: Goal:  Level of anxiety will decrease Outcome: Progressing   Problem: Elimination: Goal: Will not experience complications related to bowel motility Outcome: Progressing Goal: Will not experience complications related to urinary retention Outcome: Progressing   Problem: Elimination: Goal: Will not experience complications related to bowel motility Outcome: Progressing   Problem: Elimination: Goal: Will not experience complications related to urinary retention Outcome: Progressing   Problem: Pain Managment: Goal: General experience of comfort will improve Outcome: Progressing   Problem: Pain Managment: Goal: General experience of comfort will improve Outcome: Progressing   Problem: Safety: Goal: Ability to remain free from injury will improve Outcome: Progressing   Problem: Safety: Goal: Ability to remain free from injury will improve Outcome: Progressing   Problem: Skin Integrity: Goal: Risk for impaired skin integrity will decrease Outcome: Progressing   Problem: Skin Integrity: Goal: Risk for impaired skin integrity will decrease Outcome: Progressing

## 2023-02-05 NOTE — Discharge Summary (Signed)
Physician Discharge Summary   Patient: [name] MRN: [id number] DOB: [birth year]  Admit date:     01/31/2023  Discharge date: 02/05/23  Discharge Physician: Alberteen Sam   PCP: Lonie Peak, PA-C     Recommendations at discharge:  Continue tube feeds, prosource and free water as below Consult nutrition for consolidation to bolus feeding schedule when appropriate Repeat BMP in 1 week Continue Eliquis 10 mg twice daily for 6 more days until 10/12, then on 10/13 reduce to 5 mg BID for three months then stop Convert all medications administration route to "Per Tube" Strict NPO for now, can work with speech therapy as desired by patient/family to attempt oral intake in the future per goals of care     Discharge Diagnoses: Principal Problem:   Severe sepsis due to aspiration pneumonia (HCC) Active Problems:   Dysphagia   Hypernatremia   Multiple sclerosis (HCC)   Hypertension   Acute respiratory failure with hypoxia (HCC)   Single subsegmental pulmonary embolism without acute cor pulmonale (HCC)   Hypokalemia   Malnutrition of moderate degree      Hospital Course: [name] is a 42 y.o. M with hx relapsing remitting MS, not currently on disease specific therapy, wheelchair bound and nonverbal at baseline, lives at [facility], also HTN who presented with apparent respiratory distress.  SpO2 68% with EMS, tachypneic to 30s in ER, placed on BiPAP, found to have subsegmental PE and bilateral aspiration pneumonia.        * Severe sepsis due to aspiration pneumonia (HCC) Presented with tachycardia, tachypnea, leukocytosis, and respiratory failure in the setting of pneumonia.  Blood cultures negative.  Completed 5 days antibiotics in the hospital, Patient now mentating at baseline. Temp < 100 F, heart rate and RR at baseline, SpO2 at baseline.   Stable for discharge.      Dysphagia Due to MS.    SLP consulted, felt that his swallowing is  significantly worse than prior.    Discussed with brother by phone, clarified that PEG tube would not clearly reduce aspirations, but would allow for around administration of fluids and nutrition at goal rate to prevent dehydration or malnourishment.   PEG placed on 10/4 by General Surgery.  Did well post-placement, advanced to goal rate.  See below.      Hypernatremia Due to inadequate oral intake due to dysphagia due to MS.  Corrected with fluids.  - Ensure adequate free water as below.     Malnutrition of moderate degree Feeding schedule: Osmolite 1.5 at 55 ml/hr continuous  Prosource TF20 60 mL twice daily Free water 200 ml every 4 hours  Recommend to work with nutrition to consolidate to bolus feeding schedule.  Hypokalemia Supplemented.  Single subsegmental pulmonary embolism without acute cor pulmonale (HCC) Isolated small PE.  Likely asymptomatic.  Very low risk.  Treated with Eliquis.  BID dose for 1 week then 5 mg BID per tube.  Recommend 3 months treatment then stop.   - Check CBC in 1 week    Acute respiratory failure with hypoxia (HCC)  Hypertension  Multiple sclerosis (HCC) Advanced disability.  Nonverbal, wheelchair bound.  Follows with Dr. Malvin Johns in [place].  Not on disease specific therapy at present.             The [facility] Controlled Substances Registry was reviewed for this patient prior to discharge.   Consultants: None Procedures performed: PEG placement  Disposition: Long term care facility Diet recommendation: NPO all  nutrition/fluids per Tube   DISCHARGE MEDICATION: Allergies as of 02/05/2023   No Known Allergies      Medication List     STOP taking these medications    acetaminophen 325 MG tablet Commonly known as: TYLENOL   acetaminophen 650 MG suppository Commonly known as: TYLENOL   gabapentin 100 MG capsule Commonly known as: NEURONTIN Replaced by: gabapentin 250 MG/5ML solution   pantoprazole 40 MG  tablet Commonly known as: PROTONIX       TAKE these medications    apixaban 5 MG Tabs tablet Commonly known as: ELIQUIS Place 2 tablets (10 mg total) into feeding tube 2 (two) times daily for 6 days, THEN 1 tablet (5 mg total) 2 (two) times daily. Start taking on: February 05, 2023   Dulcolax 10 MG suppository Generic drug: bisacodyl Place 10 mg rectally daily as needed (for constipation).   EUCERIN INTENSIVE REPAIR EX Apply 1 application  topically See admin instructions. Apply to face and body in the morning and at bedtime   feeding supplement (OSMOLITE 1.5 CAL) Liqd Place 1,000 mLs into feeding tube daily. Start taking on: February 06, 2023   feeding supplement (PROSource TF20) liquid Place 60 mLs into feeding tube 2 (two) times daily.   free water Soln Place 200 mLs into feeding tube every 4 (four) hours.   gabapentin 250 MG/5ML solution Commonly known as: NEURONTIN Place 2 mLs (100 mg total) into feeding tube daily. Start taking on: February 06, 2023 Replaces: gabapentin 100 MG capsule   lidocaine 4 % Place 1 patch onto the skin See admin instructions. Apply 1 patch to the lower back and lower extremities once a day- remove as directed   polyethylene glycol powder 17 GM/SCOOP powder Commonly known as: GLYCOLAX/MIRALAX Place 17 g into feeding tube daily. What changed: how to take this   potassium chloride 20 MEQ/15ML (10%) Soln Place 7.5 mLs (10 mEq total) into feeding tube daily. What changed: how to take this   sertraline 20 MG/ML concentrated solution Commonly known as: ZOLOFT Place 5 mLs (100 mg total) into feeding tube daily. What changed: how to take this   thiamine 100 MG tablet Commonly known as: Vitamin B-1 Place 1 tablet (100 mg total) into feeding tube daily.        Follow-up Information     Kinsinger, De Blanch, MD Follow up.   Specialty: General Surgery Why: As needed Contact information: [address] [place]  [address] [phone]                   Discharge Exam: Ceasar Mons Weights   01/31/23 1740 02/02/23 1138  Weight: 78.5 kg 78.5 kg    General: Pt is alert, awake, not in acute distress, lying in bed, nonverbal Cardiovascular: RRR, nl S1-S2, no murmurs appreciated.   No LE edema.   Respiratory: Normal respiratory rate and rhythm.  CTAB without rales or wheezes. Abdominal: Abdomen soft and non-tender.  No distension or HSM.   Neuro/Psych: Contractures in all four extremities.  Responds appropriately with head nod to questions   Condition at discharge: stable  The results of significant diagnostics from this hospitalization (including imaging, microbiology, ancillary and laboratory) are listed below for reference.   Imaging Studies: DG Abd 1 View  Result Date: 02/02/2023 CLINICAL DATA:  G-tube EXAM: ABDOMEN - 1 VIEW COMPARISON:  CT 01/31/2023 FINDINGS: Nonobstructed gas pattern with mild air-filled small bowel and gas in the colon. Interim placement of gastrostomy tube projecting over  the body of stomach. IMPRESSION: Interim placement of gastrostomy tube projecting over the body of stomach. Electronically Signed   By: Jasmine Pang M.D.   On: 02/02/2023 19:43   ECHOCARDIOGRAM COMPLETE  Result Date: 02/02/2023    ECHOCARDIOGRAM REPORT   Patient Name:   CARR SHARTZER Date of Exam: 02/02/2023 Medical Rec #:  [id number]     Height:       70.0 in Accession #:    [id number]    Weight:       173.0 lb Date of Birth:  1980/08/17     BSA:          1.963 m Patient Age:    42 years      BP:           137/88 mmHg Patient Gender: M             HR:           96 bpm. Exam Location:  Inpatient Procedure: 2D Echo, Cardiac Doppler and Color Doppler Indications:    pulmonary emboli  History:        Patient has no prior history of Echocardiogram examinations.                 Signs/Symptoms:sepsis; Risk Factors:Hypertension.  Sonographer:    Milda Smart Referring Phys: [id number] MIR M [facility] IMPRESSIONS  1.  Left ventricular ejection fraction, by estimation, is 60 to 65%. The left ventricle has normal function. The left ventricle has no regional wall motion abnormalities. Left ventricular diastolic parameters are indeterminate.  2. Right ventricular systolic function is normal. The right ventricular size is normal. Tricuspid regurgitation signal is inadequate for assessing PA pressure.  3. The mitral valve is normal in structure. No evidence of mitral valve regurgitation. No evidence of mitral stenosis.  4. The aortic valve is normal in structure. Aortic valve regurgitation is not visualized. No aortic stenosis is present.  5. The inferior vena cava is normal in size with greater than 50% respiratory variability, suggesting right atrial pressure of 3 mmHg. FINDINGS  Left Ventricle: Left ventricular ejection fraction, by estimation, is 60 to 65%. The left ventricle has normal function. The left ventricle has no regional wall motion abnormalities. The left ventricular internal cavity size was normal in size. There is  no left ventricular hypertrophy. Left ventricular diastolic parameters are indeterminate. Right Ventricle: The right ventricular size is normal. No increase in right ventricular wall thickness. Right ventricular systolic function is normal. Tricuspid regurgitation signal is inadequate for assessing PA pressure. Left Atrium: Left atrial size was normal in size. Right Atrium: Right atrial size was normal in size. Pericardium: There is no evidence of pericardial effusion. Mitral Valve: The mitral valve is normal in structure. No evidence of mitral valve regurgitation. No evidence of mitral valve stenosis. Tricuspid Valve: The tricuspid valve is normal in structure. Tricuspid valve regurgitation is not demonstrated. No evidence of tricuspid stenosis. Aortic Valve: The aortic valve is normal in structure. Aortic valve regurgitation is not visualized. No aortic stenosis is present. Pulmonic Valve: The pulmonic  valve was normal in structure. Pulmonic valve regurgitation is not visualized. No evidence of pulmonic stenosis. Aorta: The aortic root is normal in size and structure. Venous: The inferior vena cava is normal in size with greater than 50% respiratory variability, suggesting right atrial pressure of 3 mmHg. IAS/Shunts: No atrial level shunt detected by color flow Doppler.  LEFT VENTRICLE PLAX 2D LVIDd:  3.40 cm     Diastology LVIDs:         2.70 cm     LV e' medial:  6.64 cm/s LV PW:         0.90 cm     LV e' lateral: 9.57 cm/s LV IVS:        0.90 cm LVOT diam:     2.10 cm LV SV:         50 LV SV Index:   25 LVOT Area:     3.46 cm  LV Volumes (MOD) LV vol d, MOD A2C: 44.6 ml LV vol d, MOD A4C: 48.9 ml LV vol s, MOD A2C: 17.8 ml LV vol s, MOD A4C: 16.7 ml LV SV MOD A2C:     26.8 ml LV SV MOD A4C:     48.9 ml LV SV MOD BP:      30.0 ml RIGHT VENTRICLE RV Basal diam:  2.10 cm RV S prime:     12.80 cm/s TAPSE (M-mode): 2.1 cm LEFT ATRIUM             Index        RIGHT ATRIUM          Index LA diam:        1.80 cm 0.92 cm/m   RA Area:     6.94 cm LA Vol (A2C):   21.0 ml 10.70 ml/m  RA Volume:   9.90 ml  5.04 ml/m LA Vol (A4C):   15.3 ml 7.80 ml/m LA Biplane Vol: 17.8 ml 9.07 ml/m  AORTIC VALVE LVOT Vmax:   83.00 cm/s LVOT Vmean:  64.700 cm/s LVOT VTI:    0.143 m  AORTA Ao Root diam: 3.00 cm  SHUNTS Systemic VTI:  0.14 m Systemic Diam: 2.10 cm Kardie Tobb DO Electronically signed by Thomasene Ripple DO Signature Date/Time: 02/02/2023/1:16:04 PM    Final    CT ABDOMEN PELVIS W CONTRAST  Result Date: 01/31/2023 CLINICAL DATA:  Sepsis abdomen pain hypoxic EXAM: CT ABDOMEN AND PELVIS WITH CONTRAST TECHNIQUE: Multidetector CT imaging of the abdomen and pelvis was performed using the standard protocol following bolus administration of intravenous contrast. RADIATION DOSE REDUCTION: This exam was performed according to the departmental dose-optimization program which includes automated exposure control, adjustment of  the mA and/or kV according to patient size and/or use of iterative reconstruction technique. CONTRAST:  OMNIPAQUE IOHEXOL 350 MG/ML SOLN COMPARISON:  Chest x-ray 01/31/2023 FINDINGS: Lower chest: Lung bases demonstrate heterogeneous ground-glass density in the left lower lobe. Lower lobe pulmonary arterial filling defects are better seen on CTA chest. Hepatobiliary: No focal liver abnormality is seen. No gallstones, gallbladder wall thickening, or biliary dilatation. Pancreas: Unremarkable. No pancreatic ductal dilatation or surrounding inflammatory changes. Spleen: Normal in size without focal abnormality. Adrenals/Urinary Tract: Adrenal glands are normal. Kidneys show no hydronephrosis. Thick-walled urinary bladder with catheter Stomach/Bowel: Stomach nonenlarged. No dilated small bowel. Fluid in the rectosigmoid colon with suspicion of mild wall thickening and mucosal enhancement. Vascular/Lymphatic: Mild aortic atherosclerosis. No aneurysm. No suspicious lymph nodes Reproductive: Negative prostate Other: Negative for pelvic effusion or free air Musculoskeletal: No acute or suspicious osseous abnormality IMPRESSION: 1. Fluid in the rectosigmoid colon with suspicion of mild wall thickening and mucosal enhancement, correlate for symptoms of proctocolitis. 2. Thick-walled urinary bladder with catheter in place. Correlate with urinalysis to exclude cystitis 3. Heterogeneous ground-glass density in the left lower lobe, question pneumonia. Lower lobe pulmonary arterial filling defects are better seen on CTA chest.  Reference chest CTA report 4. Aortic atherosclerosis. Aortic Atherosclerosis (ICD10-I70.0). Electronically Signed   By: Jasmine Pang M.D.   On: 01/31/2023 17:22   CT Angio Chest PE W and/or Wo Contrast  Result Date: 01/31/2023 CLINICAL DATA:  Shortness of breath. EXAM: CT ANGIOGRAPHY CHEST WITH CONTRAST TECHNIQUE: Multidetector CT imaging of the chest was performed using the standard protocol  during bolus administration of intravenous contrast. Multiplanar CT image reconstructions and MIPs were obtained to evaluate the vascular anatomy. RADIATION DOSE REDUCTION: This exam was performed according to the departmental dose-optimization program which includes automated exposure control, adjustment of the mA and/or kV according to patient size and/or use of iterative reconstruction technique. CONTRAST:  OMNIPAQUE IOHEXOL 350 MG/ML SOLN COMPARISON:  None Available. FINDINGS: Cardiovascular: Small filling defect is noted in lower lobe branch of left pulmonary artery consistent with pulmonary embolus. Normal cardiac size. No pericardial effusion. Mediastinum/Nodes: No enlarged mediastinal, hilar, or axillary lymph nodes. Thyroid gland, trachea, and esophagus demonstrate no significant findings. Lungs/Pleura: No pneumothorax or pleural effusion is noted. Aspirated material is noted in left lower lobe bronchi with mild patchy opacity seen in left lower lobe concerning for pneumonia. Smaller patchy opacities are noted in left upper and right lower lobes concerning for multifocal pneumonia. Upper Abdomen: No acute abnormality. Musculoskeletal: No chest wall abnormality. No acute or significant osseous findings. Review of the MIP images confirms the above findings. IMPRESSION: Small filling defect is noted in lower lobe branch of left pulmonary artery consistent with pulmonary embolus. Critical Value/emergent results were called by telephone at the time of interpretation on 01/31/2023 at 5:16 pm to provider DAVID YAO , who verbally acknowledged these results. Aspirated material is noted in left lower lobe bronchi with multiple patchy opacities in left lower lobe concerning for pneumonia. Smaller patchy opacities are also noted in left upper and right lower lobe concerning for multifocal pneumonia. Electronically Signed   By: Lupita Raider M.D.   On: 01/31/2023 17:17   CT HEAD WO CONTRAST ( )  Result  Date: 01/31/2023 CLINICAL DATA:  Mental status change, unknown cause EXAM: CT HEAD WITHOUT CONTRAST TECHNIQUE: Contiguous axial images were obtained from the base of the skull through the vertex without intravenous contrast. RADIATION DOSE REDUCTION: This exam was performed according to the departmental dose-optimization program which includes automated exposure control, adjustment of the mA and/or kV according to patient size and/or use of iterative reconstruction technique. COMPARISON:  MRI 11/11/2021 FINDINGS: Brain: Extensive low-density areas throughout the deep white matter compatible with severe demyelinating disease, stable since prior MRI. No hemorrhage, hydrocephalus or acute infarction. Vascular: No hyperdense vessel or unexpected calcification. Skull: No acute calvarial abnormality. Sinuses/Orbits: No acute findings Other: None IMPRESSION: Extensive patchy low-density throughout the deep white matter compatible with severe demyelinating disease, stable since recent MRI. Electronically Signed   By: Charlett Nose M.D.   On: 01/31/2023 17:13   DG Chest Port 1 View  Result Date: 01/31/2023 CLINICAL DATA:  Respiratory distress, sepsis EXAM: PORTABLE CHEST 1 VIEW COMPARISON:  11/07/2021 FINDINGS: The heart size and mediastinal contours are within normal limits. Both lungs are clear. The visualized skeletal structures are unremarkable. Artifact overlies the left upper lobe. IMPRESSION: No active disease. Electronically Signed   By: Judie Petit.  Shick M.D.   On: 01/31/2023 15:25    Microbiology: Results for orders placed or performed during the hospital encounter of 01/31/23  Blood Culture (routine x 2)     Status: None   Collection Time: 01/31/23  2:36  PM   Specimen: BLOOD  Result Value Ref Range Status   Specimen Description   Final    BLOOD BLOOD RIGHT FOREARM Performed at [facility], [address], [place], [place] [address]    Special Requests   Final    BOTTLES DRAWN AEROBIC  AND ANAEROBIC Blood Culture adequate volume Performed at [facility], [address] [place], [place] [address]    Culture   Final    NO GROWTH 5 DAYS Performed at [facility], [address], [place], [place] [address]    Report Status 02/05/2023 FINAL  Final  Blood Culture (routine x 2)     Status: None   Collection Time: 01/31/23  2:36 PM   Specimen: BLOOD  Result Value Ref Range Status   Specimen Description   Final    BLOOD BLOOD LEFT FOREARM Performed at [facility], [address] [place], [place] [address]    Special Requests   Final    BOTTLES DRAWN AEROBIC AND ANAEROBIC Blood Culture results may not be optimal due to an excessive volume of blood received in culture bottles Performed at [facility], [address] [place], [place] [address]    Culture   Final    NO GROWTH 5 DAYS Performed at [facility], [address] [place], [place] [address]    Report Status 02/05/2023 FINAL  Final  Resp panel by RT-PCR (RSV, Flu A&B, Covid) Anterior Nasal Swab     Status: None   Collection Time: 01/31/23  3:36 PM   Specimen: Anterior Nasal Swab  Result Value Ref Range Status   SARS Coronavirus 2 by RT PCR NEGATIVE NEGATIVE Final    Comment: (NOTE) SARS-CoV-2 target nucleic acids are NOT DETECTED.  The SARS-CoV-2 RNA is generally detectable in upper respiratory specimens during the acute phase of infection. The lowest concentration of SARS-CoV-2 viral copies this assay can detect is 138 copies/mL. A negative result does not preclude SARS-Cov-2 infection and should not be used as the sole basis for treatment or other patient management decisions. A negative result may occur with  improper specimen collection/handling, submission of specimen other than nasopharyngeal swab, presence of viral mutation(s) within the areas targeted by this assay, and inadequate number of viral copies(<138 copies/mL). A  negative result must be combined with clinical observations, patient history, and epidemiological information. The expected result is Negative.  Fact Sheet for Patients:  BloggerCourse.com  Fact Sheet for Healthcare Providers:  SeriousBroker.it  This test is no t yet approved or cleared by the [place] FDA and  has been authorized for detection and/or diagnosis of SARS-CoV-2 by FDA under an Emergency Use Authorization (EUA). This EUA will remain  in effect (meaning this test can be used) for the duration of the COVID-19 declaration under Section 564(b)(1) of the Act, 21 U.S.C.section 360bbb-3(b)(1), unless the authorization is terminated  or revoked sooner.       Influenza A by PCR NEGATIVE NEGATIVE Final   Influenza B by PCR NEGATIVE NEGATIVE Final    Comment: (NOTE) The Xpert Xpress SARS-CoV-2/FLU/RSV plus assay is intended as an aid in the diagnosis of influenza from Nasopharyngeal swab specimens and should not be used as a sole basis for treatment. Nasal washings and aspirates are unacceptable for Xpert Xpress SARS-CoV-2/FLU/RSV testing.  Fact Sheet for Patients: BloggerCourse.com  Fact Sheet for Healthcare Providers: SeriousBroker.it  This test is not yet approved or cleared by the [place] FDA and has been authorized for detection and/or diagnosis of SARS-CoV-2 by FDA under an Emergency Use Authorization (EUA). This EUA will remain in effect (meaning this test can be used) for the duration of the COVID-19 declaration under Section 564(b)(1) of the Act, 21 U.S.C. section 360bbb-3(b)(1), unless the authorization is terminated or revoked.     Resp Syncytial Virus by PCR NEGATIVE NEGATIVE Final    Comment: (NOTE) Fact Sheet for Patients: BloggerCourse.com  Fact Sheet for Healthcare  Providers: SeriousBroker.it  This test is not yet approved or cleared by the [place] FDA and has been authorized for detection and/or diagnosis of SARS-CoV-2 by FDA under an Emergency Use Authorization (EUA). This EUA will remain in effect (meaning this test can be used) for the duration of the COVID-19 declaration under Section 564(b)(1) of the Act, 21 U.S.C. section 360bbb-3(b)(1), unless the authorization is terminated or revoked.  Performed at [facility], [address] [place], [place] [address]   MRSA Next Gen by PCR, Nasal     Status: None   Collection Time: 01/31/23  8:45 PM   Specimen: Nasal Mucosa; Nasal Swab  Result Value Ref Range Status   MRSA by PCR Next Gen NOT DETECTED NOT DETECTED Final    Comment: (NOTE) The GeneXpert MRSA Assay (FDA approved for NASAL specimens only), is one component of a comprehensive MRSA colonization surveillance program. It is not intended to diagnose MRSA infection nor to guide or monitor treatment for MRSA infections. Test performance is not FDA approved in patients less than 77 years old. Performed at [facility], [address] [place], [place] [address]     Labs: CBC: Recent Labs  Lab 01/31/23 1436 02/01/23 [phone] 02/02/23 0549 02/03/23 0609 02/04/23 0609 02/05/23 0531  WBC 10.9* 14.0* 6.2 7.1 5.3 4.4  NEUTROABS 8.2*  --   --   --   --   --   HGB 14.5 12.5* 10.8* 11.2* 11.4* 12.4*  HCT 47.8 41.5 35.6* 36.0* 36.3* 39.3  MCV 90.7 94.7 93.7 91.6 90.3 89.9  PLT 358 233 210 214 222 239   Basic Metabolic Panel: Recent Labs  Lab 02/01/23 1116 02/02/23 0549 02/02/23 1538 02/03/23 0609 02/04/23 0609 02/05/23 0531  NA 155* 149*  --  145 144 144  K 3.2* 2.9*  --  3.1* 3.3* 4.2  CL 121* 121*  --  111 109 106  CO2 21* 22  --  26 25 29   GLUCOSE 117* 151*  --  143* 122* 142*  BUN 15 9  --  6 6 9   CREATININE 0.53* 0.51*  --  0.52* 0.44* 0.46*  CALCIUM  8.4* 7.9*  --  8.1* 8.3* 8.8*  MG  --   --  1.8 1.7 1.8 1.8  PHOS  --   --  1.8* 1.6* 2.9 2.6   Liver Function Tests: Recent Labs  Lab 01/31/23 1436 02/03/23 0609 02/04/23 0609 02/05/23 0531  AST 26 22  --   --   ALT 31 18  --   --   ALKPHOS 75 52  --   --   BILITOT 0.8 0.5  --   --   PROT 8.1 5.8*  --   --   ALBUMIN 3.4* 2.4* 2.6* 2.6*   CBG: Recent Labs  Lab 02/04/23 1611 02/04/23 2012 02/05/23 0000 02/05/23 0415 02/05/23 0726  GLUCAP 110* 142* 151* 154* 153*    Discharge time spent: approximately 35 minutes spent on discharge counseling, evaluation  of patient on day of discharge, and coordination of discharge planning with nursing, social work, pharmacy and case management  Signed: Alberteen Sam, MD [facility] 02/05/2023

## 2023-02-05 NOTE — Progress Notes (Signed)
SLP Cancellation Note  Patient Details Name: ISHAM SMITHERMAN MRN: [id number] DOB: July 15, 1980   Cancelled treatment:       Reason Eval/Treat Not Completed: Other (comment). Patient is planned discharge back to SNF today. Per MD discharge note, recommendation is for strict NPO, nutrition and meds through PEG only, and for SLP f/u for PO potential at SNF if desired by patient/family.  Angela Nevin, MA, CCC-SLP Speech Therapy

## 2023-02-05 NOTE — Discharge Instructions (Signed)
Information on my medicine - ELIQUIS (apixaban)  This medication education was reviewed with me or my healthcare representative as part of my discharge preparation.  Why was Eliquis prescribed for you? Eliquis was prescribed to treat blood clots that may have been found in the veins of your legs (deep vein thrombosis) or in your lungs (pulmonary embolism) and to reduce the risk of them occurring again.  What do You need to know about Eliquis ? The starting dose is 10 mg (two 5 mg tablets) taken TWICE daily for the FIRST SEVEN (7) DAYS, then on 02/11/2023  the dose is reduced to ONE 5 mg tablet taken TWICE daily.  Eliquis may be taken with or without food.   Try to take the dose about the same time in the morning and in the evening. If you have difficulty swallowing the tablet whole please discuss with your pharmacist how to take the medication safely.  Take Eliquis exactly as prescribed and DO NOT stop taking Eliquis without talking to the doctor who prescribed the medication.  Stopping may increase your risk of developing a new blood clot.  Refill your prescription before you run out.  After discharge, you should have regular check-up appointments with your healthcare provider that is prescribing your Eliquis.    What do you do if you miss a dose? If a dose of ELIQUIS is not taken at the scheduled time, take it as soon as possible on the same day and twice-daily administration should be resumed. The dose should not be doubled to make up for a missed dose.  Important Safety Information A possible side effect of Eliquis is bleeding. You should call your healthcare provider right away if you experience any of the following: Bleeding from an injury or your nose that does not stop. Unusual colored urine (red or dark brown) or unusual colored stools (red or black). Unusual bruising for unknown reasons. A serious fall or if you hit your head (even if there is no bleeding).  Some  medicines may interact with Eliquis and might increase your risk of bleeding or clotting while on Eliquis. To help avoid this, consult your healthcare provider or pharmacist prior to using any new prescription or non-prescription medications, including herbals, vitamins, non-steroidal anti-inflammatory drugs (NSAIDs) and supplements.  This website has more information on Eliquis (apixaban): http://www.eliquis.com/eliquis/home

## 2023-04-08 ENCOUNTER — Other Ambulatory Visit: Payer: Self-pay

## 2023-04-08 ENCOUNTER — Emergency Department (HOSPITAL_COMMUNITY)
Admission: EM | Admit: 2023-04-08 | Discharge: 2023-04-09 | Disposition: A | Discharge status: Skilled Nursing Facility | Payer: Medicare Other | Source: Home / Self Care | Attending: Emergency Medicine | Admitting: Emergency Medicine

## 2023-04-08 DIAGNOSIS — T82898A Other specified complication of vascular prosthetic devices, implants and grafts, initial encounter: Secondary | ICD-10-CM | POA: Insufficient documentation

## 2023-04-08 DIAGNOSIS — Y732 Prosthetic and other implants, materials and accessory gastroenterology and urology devices associated with adverse incidents: Secondary | ICD-10-CM | POA: Insufficient documentation

## 2023-04-08 DIAGNOSIS — Z7901 Long term (current) use of anticoagulants: Secondary | ICD-10-CM | POA: Insufficient documentation

## 2023-04-08 DIAGNOSIS — I10 Essential (primary) hypertension: Secondary | ICD-10-CM | POA: Insufficient documentation

## 2023-04-08 DIAGNOSIS — M4628 Osteomyelitis of vertebra, sacral and sacrococcygeal region: Secondary | ICD-10-CM | POA: Diagnosis not present

## 2023-04-08 LAB — BASIC METABOLIC PANEL
Anion gap: 9 (ref 5–15)
BUN: 42 mg/dL — ABNORMAL HIGH (ref 6–20)
CO2: 31 mmol/L (ref 22–32)
Calcium: 9.2 mg/dL (ref 8.9–10.3)
Chloride: 107 mmol/L (ref 98–111)
Creatinine, Ser: 0.62 mg/dL (ref 0.61–1.24)
GFR, Estimated: >60 mL/min (ref >60)
Glucose, Bld: 282 mg/dL — ABNORMAL HIGH (ref 70–99)
Potassium: 4.1 mmol/L (ref 3.5–5.1)
Sodium: 147 mmol/L — ABNORMAL HIGH (ref 135–145)

## 2023-04-08 LAB — CBC
HCT: 32.2 % — ABNORMAL LOW (ref 39.0–52.0)
Hemoglobin: 10 g/dL — ABNORMAL LOW (ref 13.0–17.0)
MCH: 28.2 pg (ref 26.0–34.0)
MCHC: 31.1 g/dL (ref 30.0–36.0)
MCV: 91 fL (ref 80.0–100.0)
Platelets: 362 10*3/uL (ref 150–400)
RBC: 3.54 MIL/uL — ABNORMAL LOW (ref 4.22–5.81)
RDW: 15.4 % (ref 11.5–15.5)
WBC: 6.4 10*3/uL (ref 4.0–10.5)
nRBC: 0 % (ref 0.0–0.2)

## 2023-04-08 MED ORDER — SODIUM CHLORIDE 0.9 % IV SOLN: 1.0000 g | Freq: Once | INTRAVENOUS | Status: DC

## 2023-04-08 MED ORDER — VANCOMYCIN HCL 1.5 G IV SOLR
1500.0000 mg | Freq: Once | INTRAVENOUS | Status: DC
  Filled 2023-04-08: qty 30

## 2023-04-08 MED ORDER — VANCOMYCIN HCL 1250 MG/250ML IV SOLN
1250.0000 mg | Freq: Once | INTRAVENOUS | Status: AC
  Administered 2023-04-08: 1250 mg via INTRAVENOUS
  Filled 2023-04-08: qty 250

## 2023-04-08 MED ORDER — SODIUM CHLORIDE 0.9 % IV BOLUS
500.0000 mL | Freq: Once | INTRAVENOUS | Status: AC
  Administered 2023-04-08: 500 mL via INTRAVENOUS

## 2023-04-08 NOTE — ED Provider Notes (Addendum)
[place] [facility]ERGENCY DEPARTMENT AT [facility] Provider Note   CSN: [id number] Arrival date & time: 04/08/23  2046     History  Chief Complaint  Patient presents with   Vascular Access Problem    [name] is a 42 y.o. male.  HPI   Patient has history of multiple sclerosis, hypertension neuropathic pain pulmonary embolism malnutrition.  Patient is a resident of a nursing facility.  Patient has a PICC line in his left AC.  He has been receiving antibiotics per the nursing facility.  They called EMS today because they were not able to administer antibiotics through the PICC line.  Patient is nonverbal but can nod his head yes and no.  He denies any fevers.  He denies chest pain or abdominal pain.  No vomiting or diarrhea.  Prior medical records reviewed.  Patient had laboratory tests as an outpatient today.  He had a white count of 6, hemoglobin of 10.  His electrolytes did show sodium of 148 potassium of 4.4 glucose of 254 BUN of 37 and a creatinine of 0.57  Home Medications Prior to Admission medications   Medication Sig Start Date End Date Taking? Authorizing Provider  apixaban (ELIQUIS) 5 MG TABS tablet Place 2 tablets (10 mg total) into feeding tube 2 (two) times daily for 6 days, THEN 1 tablet (5 mg total) 2 (two) times daily. 02/05/23 05/12/23  Danford, Earl Lites, MD  DULCOLAX 10 MG suppository Place 10 mg rectally daily as needed (for constipation).    [provider]  Emollient (EUCERIN INTENSIVE REPAIR EX) Apply 1 application  topically See admin instructions. Apply to face and body in the morning and at bedtime    [provider]  gabapentin (NEURONTIN) 250 MG/5ML solution Place 2 mLs (100 mg total) into feeding tube daily. 02/06/23   Danford, Earl Lites, MD  lidocaine 4 % Place 1 patch onto the skin See admin instructions. Apply 1 patch to the lower back and lower extremities once a day- remove as directed    [provider]   Nutritional Supplements (FEEDING SUPPLEMENT, OSMOLITE 1.5 CAL,) LIQD Place 1,000 mLs into feeding tube daily. 02/06/23   Danford, Earl Lites, MD  polyethylene glycol powder (GLYCOLAX/MIRALAX) 17 GM/SCOOP powder Place 17 g into feeding tube daily. 02/05/23   Danford, Earl Lites, MD  potassium chloride 20 MEQ/15ML (10%) SOLN Place 7.5 mLs (10 mEq total) into feeding tube daily. 02/05/23   Danford, Earl Lites, MD  Protein (FEEDING SUPPLEMENT, PROSOURCE TF20,) liquid Place 60 mLs into feeding tube 2 (two) times daily. 02/05/23   Danford, Earl Lites, MD  sertraline (ZOLOFT) 20 MG/ML concentrated solution Place 5 mLs (100 mg total) into feeding tube daily. 02/05/23   Danford, Earl Lites, MD  thiamine (VITAMIN B-1) 100 MG tablet Place 1 tablet (100 mg total) into feeding tube daily. 02/05/23   Danford, Earl Lites, MD  Water For Irrigation, Sterile (FREE WATER) SOLN Place 200 mLs into feeding tube every 4 (four) hours. 02/05/23   Danford, Earl Lites, MD      Allergies    Patient has no known allergies.    Review of Systems   Review of Systems  Physical Exam Updated Vital Signs BP (!) 141/99   Pulse (!) 101   Temp 100.3 F (37.9 C) (Oral)   Resp (!) 22   SpO2 97%  Physical Exam Vitals and nursing note reviewed.  Constitutional:      Appearance: He is well-developed.  HENT:  Head: Normocephalic and atraumatic.     Right Ear: External ear normal.     Left Ear: External ear normal.  Eyes:     General: No scleral icterus.       Right eye: No discharge.        Left eye: No discharge.     Conjunctiva/sclera: Conjunctivae normal.  Neck:     Trachea: No tracheal deviation.  Cardiovascular:     Rate and Rhythm: Normal rate and regular rhythm.  Pulmonary:     Effort: Pulmonary effort is normal. No respiratory distress.     Breath sounds: Normal breath sounds. No stridor. No wheezing or rales.  Abdominal:     General: Bowel sounds are normal. There is no distension.      Palpations: Abdomen is soft.     Tenderness: There is no abdominal tenderness. There is no guarding or rebound.  Musculoskeletal:        General: No tenderness or deformity.     Cervical back: Neck supple.  Skin:    General: Skin is warm and dry.     Findings: No rash.  Neurological:     Mental Status: He is alert. Mental status is at baseline.     Cranial Nerves: No dysarthria or facial asymmetry.     Motor: No abnormal muscle tone or seizure activity.     Comments: Patient without upper extremity or lower extremity movement, nonverbal, patient does look at me and nod yes and no  Psychiatric:        Mood and Affect: Mood normal.     ED Results / Procedures / Treatments   Labs (all labs ordered are listed, but only abnormal results are displayed) Labs Reviewed  CBC - Abnormal; Notable for the following components:      Result Value   RBC 3.54 (*)    Hemoglobin 10.0 (*)    HCT 32.2 (*)    All other components within normal limits  BASIC METABOLIC PANEL - Abnormal; Notable for the following components:   Sodium 147 (*)    Glucose, Bld 282 (*)    BUN 42 (*)    All other components within normal limits    EKG None  Radiology No results found.  Procedures Procedures    Medications Ordered in ED Medications  cefTRIAXone (ROCEPHIN) 1 g in sodium chloride 0.9 % 100 mL IVPB (has no administration in time range)  sodium chloride 0.9 % bolus 500 mL (500 mLs Intravenous New Bag/Given 04/08/23 2255)    ED Course/ Medical Decision Making/ A&P Clinical Course as of 04/08/23 2321  Sun Apr 08, 2023  2215 CBC shows no leukocytosis.  Hemoglobin of 10 [JK]  2215 Sodium is slightly increased.  Glucose slightly elevated [JK]    Clinical Course User Index [JK] Linwood Dibbles, MD                                 Medical Decision Making Amount and/or Complexity of Data Reviewed Labs: ordered.  Risk Prescription drug management.   Patient presented to the ED because he had an  occluded PICC line.  Patient reportedly was receiving treatment for some type of infection.  No clear records of that in the EMR or with the paperwork sent by the patient.  Patient is not able to tell me what infection he was being treated for.  ED workup is reassuring.  He does not have  any leukocytosis.  Patient had an IV placed in the ED. patient was receiving vancomycin at the nursing facility.  Patient given a dose here in the ED we will discharge him back to the nursing facility with that IV in place so they can use that until they can arrange for a new PICC line    Final Clinical Impression(s) / ED Diagnoses Final diagnoses:  Occluded PICC line, initial encounter [facility])    Rx / DC Orders ED Discharge Orders     None         Linwood Dibbles, MD 04/08/23 2321    Linwood Dibbles, MD 04/08/23 2329

## 2023-04-08 NOTE — ED Notes (Signed)
  [facility] returned phone call. Stated that Pt was discharged from their care in november

## 2023-04-08 NOTE — ED Notes (Signed)
Contacted [employer] for [employer].

## 2023-04-08 NOTE — ED Triage Notes (Signed)
Pt BIB [facility] from [facility]. Pt staff reports being unable to access pt picc line. Facility nurses also reports not being able to administer pt antibiotics for unknown amount of time. Pt c/o not feeling well. Pt non verbal, denies pain.  132/84 112HR 96% RA 346CBG no hx of diabetes

## 2023-04-08 NOTE — ED Notes (Signed)
Contacted [facility] for coordination.

## 2023-04-08 NOTE — Discharge Instructions (Addendum)
We placed an IV in the ED so you can continue your antibiotic regimen until a new PICC line can be placed as an outpatient.  Continue your regimen at the facility.

## 2023-04-09 ENCOUNTER — Inpatient Hospital Stay (HOSPITAL_COMMUNITY)
Admission: EM | Admit: 2023-04-09 | Discharge: 2023-04-13 | DRG: 539 | Disposition: A | Discharge status: Skilled Nursing Facility | Payer: Medicare Other | Source: Skilled Nursing Facility | Attending: Family Medicine | Admitting: Family Medicine

## 2023-04-09 ENCOUNTER — Emergency Department (HOSPITAL_COMMUNITY): Payer: Medicare Other

## 2023-04-09 ENCOUNTER — Encounter (HOSPITAL_COMMUNITY): Payer: Self-pay | Admitting: Internal Medicine

## 2023-04-09 DIAGNOSIS — Z79899 Other long term (current) drug therapy: Secondary | ICD-10-CM

## 2023-04-09 DIAGNOSIS — E440 Moderate protein-calorie malnutrition: Secondary | ICD-10-CM | POA: Diagnosis present

## 2023-04-09 DIAGNOSIS — E876 Hypokalemia: Secondary | ICD-10-CM | POA: Diagnosis present

## 2023-04-09 DIAGNOSIS — Z7961 Long term (current) use of immunomodulator: Secondary | ICD-10-CM

## 2023-04-09 DIAGNOSIS — Z820 Family history of epilepsy and other diseases of the nervous system: Secondary | ICD-10-CM

## 2023-04-09 DIAGNOSIS — E871 Hypo-osmolality and hyponatremia: Secondary | ICD-10-CM | POA: Diagnosis not present

## 2023-04-09 DIAGNOSIS — G629 Polyneuropathy, unspecified: Secondary | ICD-10-CM | POA: Diagnosis present

## 2023-04-09 DIAGNOSIS — L89616 Pressure-induced deep tissue damage of right heel: Secondary | ICD-10-CM | POA: Diagnosis present

## 2023-04-09 DIAGNOSIS — A419 Sepsis, unspecified organism: Principal | ICD-10-CM | POA: Diagnosis present

## 2023-04-09 DIAGNOSIS — Y838 Other surgical procedures as the cause of abnormal reaction of the patient, or of later complication, without mention of misadventure at the time of the procedure: Secondary | ICD-10-CM | POA: Diagnosis present

## 2023-04-09 DIAGNOSIS — L89626 Pressure-induced deep tissue damage of left heel: Secondary | ICD-10-CM | POA: Diagnosis present

## 2023-04-09 DIAGNOSIS — R6510 Systemic inflammatory response syndrome (SIRS) of non-infectious origin without acute organ dysfunction: Secondary | ICD-10-CM

## 2023-04-09 DIAGNOSIS — M4628 Osteomyelitis of vertebra, sacral and sacrococcygeal region: Principal | ICD-10-CM | POA: Diagnosis present

## 2023-04-09 DIAGNOSIS — Z7901 Long term (current) use of anticoagulants: Secondary | ICD-10-CM

## 2023-04-09 DIAGNOSIS — Z86711 Personal history of pulmonary embolism: Secondary | ICD-10-CM

## 2023-04-09 DIAGNOSIS — R471 Dysarthria and anarthria: Secondary | ICD-10-CM | POA: Diagnosis present

## 2023-04-09 DIAGNOSIS — B9562 Methicillin resistant Staphylococcus aureus infection as the cause of diseases classified elsewhere: Secondary | ICD-10-CM | POA: Diagnosis present

## 2023-04-09 DIAGNOSIS — G8220 Paraplegia, unspecified: Secondary | ICD-10-CM | POA: Diagnosis present

## 2023-04-09 DIAGNOSIS — R1310 Dysphagia, unspecified: Secondary | ICD-10-CM | POA: Diagnosis present

## 2023-04-09 DIAGNOSIS — D649 Anemia, unspecified: Secondary | ICD-10-CM | POA: Diagnosis present

## 2023-04-09 DIAGNOSIS — Z792 Long term (current) use of antibiotics: Secondary | ICD-10-CM

## 2023-04-09 DIAGNOSIS — T82594A Other mechanical complication of infusion catheter, initial encounter: Secondary | ICD-10-CM | POA: Diagnosis present

## 2023-04-09 DIAGNOSIS — F32A Depression, unspecified: Secondary | ICD-10-CM | POA: Diagnosis present

## 2023-04-09 DIAGNOSIS — Z1152 Encounter for screening for COVID-19: Secondary | ICD-10-CM

## 2023-04-09 DIAGNOSIS — Z794 Long term (current) use of insulin: Secondary | ICD-10-CM

## 2023-04-09 DIAGNOSIS — R7401 Elevation of levels of liver transaminase levels: Secondary | ICD-10-CM | POA: Diagnosis present

## 2023-04-09 DIAGNOSIS — E1165 Type 2 diabetes mellitus with hyperglycemia: Secondary | ICD-10-CM | POA: Diagnosis present

## 2023-04-09 DIAGNOSIS — I10 Essential (primary) hypertension: Secondary | ICD-10-CM | POA: Diagnosis present

## 2023-04-09 DIAGNOSIS — L89154 Pressure ulcer of sacral region, stage 4: Secondary | ICD-10-CM | POA: Diagnosis present

## 2023-04-09 DIAGNOSIS — E870 Hyperosmolality and hypernatremia: Secondary | ICD-10-CM | POA: Diagnosis present

## 2023-04-09 DIAGNOSIS — F17210 Nicotine dependence, cigarettes, uncomplicated: Secondary | ICD-10-CM | POA: Diagnosis present

## 2023-04-09 DIAGNOSIS — E1140 Type 2 diabetes mellitus with diabetic neuropathy, unspecified: Secondary | ICD-10-CM | POA: Diagnosis present

## 2023-04-09 DIAGNOSIS — R41841 Cognitive communication deficit: Secondary | ICD-10-CM | POA: Diagnosis present

## 2023-04-09 DIAGNOSIS — Z6823 Body mass index (BMI) 23.0-23.9, adult: Secondary | ICD-10-CM

## 2023-04-09 DIAGNOSIS — Z7401 Bed confinement status: Secondary | ICD-10-CM

## 2023-04-09 DIAGNOSIS — Z931 Gastrostomy status: Secondary | ICD-10-CM

## 2023-04-09 DIAGNOSIS — G35 Multiple sclerosis: Secondary | ICD-10-CM | POA: Diagnosis present

## 2023-04-09 DIAGNOSIS — Z8249 Family history of ischemic heart disease and other diseases of the circulatory system: Secondary | ICD-10-CM

## 2023-04-09 DIAGNOSIS — Z993 Dependence on wheelchair: Secondary | ICD-10-CM

## 2023-04-09 HISTORY — DX: Cognitive communication deficit: R41.841

## 2023-04-09 HISTORY — DX: Dysarthria and anarthria: R47.1

## 2023-04-09 HISTORY — DX: Paraplegia, unspecified: G82.20

## 2023-04-09 LAB — CBC WITH DIFFERENTIAL/PLATELET
Abs Immature Granulocytes: 0.03 10*3/uL (ref 0.00–0.07)
Basophils Absolute: 0 10*3/uL (ref 0.0–0.1)
Basophils Relative: 0 %
Eosinophils Absolute: 0 10*3/uL (ref 0.0–0.5)
Eosinophils Relative: 1 %
HCT: 32.4 % — ABNORMAL LOW (ref 39.0–52.0)
Hemoglobin: 9.9 g/dL — ABNORMAL LOW (ref 13.0–17.0)
Immature Granulocytes: 0 %
Lymphocytes Relative: 11 %
Lymphs Abs: 0.8 10*3/uL (ref 0.7–4.0)
MCH: 27.7 pg (ref 26.0–34.0)
MCHC: 30.6 g/dL (ref 30.0–36.0)
MCV: 90.8 fL (ref 80.0–100.0)
Monocytes Absolute: 0.6 10*3/uL (ref 0.1–1.0)
Monocytes Relative: 8 %
Neutro Abs: 5.5 10*3/uL (ref 1.7–7.7)
Neutrophils Relative %: 80 %
Platelets: 407 10*3/uL — ABNORMAL HIGH (ref 150–400)
RBC: 3.57 MIL/uL — ABNORMAL LOW (ref 4.22–5.81)
RDW: 15.3 % (ref 11.5–15.5)
WBC: 6.9 10*3/uL (ref 4.0–10.5)
nRBC: 0 % (ref 0.0–0.2)

## 2023-04-09 LAB — RESP PANEL BY RT-PCR (RSV, FLU A&B, COVID)  RVPGX2
Influenza A by PCR: NEGATIVE
Influenza B by PCR: NEGATIVE
Resp Syncytial Virus by PCR: NEGATIVE
SARS Coronavirus 2 by RT PCR: NEGATIVE

## 2023-04-09 LAB — URINALYSIS, W/ REFLEX TO CULTURE (INFECTION SUSPECTED)
Squamous Epithelial / HPF: NONE SEEN /[HPF] (ref 0–5)
WBC, UA: >50 WBC/hpf (ref 0–5)

## 2023-04-09 LAB — I-STAT CHEM 8, ED
BUN: 29 mg/dL — ABNORMAL HIGH (ref 6–20)
Calcium, Ion: 1.19 mmol/L (ref 1.15–1.40)
Chloride: 112 mmol/L — ABNORMAL HIGH (ref 98–111)
Creatinine, Ser: 0.6 mg/dL — ABNORMAL LOW (ref 0.61–1.24)
Glucose, Bld: 440 mg/dL — ABNORMAL HIGH (ref 70–99)
HCT: 31 % — ABNORMAL LOW (ref 39.0–52.0)
Hemoglobin: 10.5 g/dL — ABNORMAL LOW (ref 13.0–17.0)
Potassium: 4.1 mmol/L (ref 3.5–5.1)
Sodium: 150 mmol/L — ABNORMAL HIGH (ref 135–145)
TCO2: 30 mmol/L (ref 22–32)

## 2023-04-09 LAB — COMPREHENSIVE METABOLIC PANEL
ALT: 54 U/L — ABNORMAL HIGH (ref 0–44)
AST: 43 U/L — ABNORMAL HIGH (ref 15–41)
Albumin: 2 g/dL — ABNORMAL LOW (ref 3.5–5.0)
Alkaline Phosphatase: 87 U/L (ref 38–126)
Anion gap: 12 (ref 5–15)
BUN: 31 mg/dL — ABNORMAL HIGH (ref 6–20)
CO2: 27 mmol/L (ref 22–32)
Calcium: 8.8 mg/dL — ABNORMAL LOW (ref 8.9–10.3)
Chloride: 112 mmol/L — ABNORMAL HIGH (ref 98–111)
Creatinine, Ser: 0.95 mg/dL (ref 0.61–1.24)
GFR, Estimated: >60 mL/min (ref >60)
Glucose, Bld: 412 mg/dL — ABNORMAL HIGH (ref 70–99)
Potassium: 4 mmol/L (ref 3.5–5.1)
Sodium: 151 mmol/L — ABNORMAL HIGH (ref 135–145)
Total Bilirubin: 0.5 mg/dL (ref <1.2)
Total Protein: 7.2 g/dL (ref 6.5–8.1)

## 2023-04-09 LAB — I-STAT CG4 LACTIC ACID, ED: Lactic Acid, Venous: 2 mmol/L (ref 0.5–1.9)

## 2023-04-09 LAB — GLUCOSE, CAPILLARY
Glucose-Capillary: 263 mg/dL — ABNORMAL HIGH (ref 70–99)
Glucose-Capillary: 260 mg/dL — ABNORMAL HIGH (ref 70–99)

## 2023-04-09 MED ORDER — VANCOMYCIN VARIABLE DOSE PER UNSTABLE RENAL FUNCTION (PHARMACIST DOSING): Status: DC

## 2023-04-09 MED ORDER — ENOXAPARIN SODIUM 60 MG/0.6ML IJ SOSY
80.0000 mg | PREFILLED_SYRINGE | Freq: Two times a day (BID) | INTRAMUSCULAR | Status: DC
  Administered 2023-04-09 – 2023-04-13 (×8): 80 mg via SUBCUTANEOUS
  Filled 2023-04-09 (×7): qty 1.2

## 2023-04-09 MED ORDER — VANCOMYCIN HCL 1.5 G IV SOLR
1500.0000 mg | Freq: Once | INTRAVENOUS | Status: AC
  Administered 2023-04-09: 1500 mg via INTRAVENOUS
  Filled 2023-04-09: qty 30

## 2023-04-09 MED ORDER — LACTATED RINGERS IV SOLN
INTRAVENOUS | Status: DC
Start: 2023-04-09 — End: 2023-04-09

## 2023-04-09 MED ORDER — ACETAMINOPHEN 160 MG/5ML PO SOLN
1000.0000 mg | Freq: Once | ORAL | Status: AC
  Administered 2023-04-09: 1000 mg
  Filled 2023-04-09: qty 40.6

## 2023-04-09 MED ORDER — VANCOMYCIN HCL IN DEXTROSE 1-5 GM/200ML-% IV SOLN: 1000.0000 mg | Freq: Once | INTRAVENOUS | Status: DC

## 2023-04-09 MED ORDER — ENOXAPARIN SODIUM 60 MG/0.6ML IJ SOSY
60.0000 mg | PREFILLED_SYRINGE | Freq: Two times a day (BID) | INTRAMUSCULAR | Status: DC
Start: 2023-04-09 — End: 2023-04-09

## 2023-04-09 MED ORDER — GABAPENTIN 250 MG/5ML PO SOLN
100.0000 mg | Freq: Every day | ORAL | Status: DC
  Administered 2023-04-10 – 2023-04-13 (×4): 100 mg
  Filled 2023-04-09 (×4): qty 2

## 2023-04-09 MED ORDER — VANCOMYCIN HCL 1250 MG/250ML IV SOLN
1250.0000 mg | Freq: Two times a day (BID) | INTRAVENOUS | Status: DC
  Filled 2023-04-09: qty 250

## 2023-04-09 MED ORDER — FREE WATER
300.0000 mL | Status: DC
  Administered 2023-04-09 – 2023-04-10 (×5): 300 mL

## 2023-04-09 MED ORDER — ACETAMINOPHEN 650 MG RE SUPP: 650.0000 mg | Freq: Four times a day (QID) | RECTAL | Status: DC | PRN

## 2023-04-09 MED ORDER — INSULIN ASPART 100 UNIT/ML IJ SOLN
0.0000 [IU] | INTRAMUSCULAR | Status: DC
  Administered 2023-04-09 (×2): 8 [IU] via SUBCUTANEOUS
  Administered 2023-04-10: 3 [IU] via SUBCUTANEOUS
  Administered 2023-04-10: 8 [IU] via SUBCUTANEOUS
  Administered 2023-04-10 – 2023-04-12 (×11): 5 [IU] via SUBCUTANEOUS
  Administered 2023-04-12 (×2): 3 [IU] via SUBCUTANEOUS
  Administered 2023-04-12 (×2): 5 [IU] via SUBCUTANEOUS
  Administered 2023-04-13 (×4): 3 [IU] via SUBCUTANEOUS

## 2023-04-09 MED ORDER — SODIUM CHLORIDE 0.9 % IV BOLUS
30.0000 mL/kg | Freq: Once | INTRAVENOUS | Status: AC
  Administered 2023-04-09: 2400 mL via INTRAVENOUS

## 2023-04-09 MED ORDER — PROPRANOLOL HCL 20 MG PO TABS
20.0000 mg | ORAL_TABLET | Freq: Two times a day (BID) | ORAL | Status: DC
  Administered 2023-04-09 – 2023-04-13 (×8): 20 mg
  Filled 2023-04-09 (×9): qty 1

## 2023-04-09 MED ORDER — FINGOLIMOD HCL 0.5 MG PO CAPS
0.5000 mg | ORAL_CAPSULE | Freq: Every day | ORAL | Status: DC
Start: 2023-04-10 — End: 2023-04-10

## 2023-04-09 MED ORDER — SODIUM CHLORIDE 0.9 % IV SOLN
2.0000 g | Freq: Once | INTRAVENOUS | Status: AC
  Administered 2023-04-09: 2 g via INTRAVENOUS
  Filled 2023-04-09: qty 12.5

## 2023-04-09 MED ORDER — VANCOMYCIN HCL 1.5 G IV SOLR
1500.0000 mg | Freq: Once | INTRAVENOUS | Status: DC
  Filled 2023-04-09: qty 30

## 2023-04-09 MED ORDER — SODIUM CHLORIDE 0.9 % IV SOLN
2.0000 g | Freq: Three times a day (TID) | INTRAVENOUS | Status: DC
  Administered 2023-04-09 – 2023-04-13 (×11): 2 g via INTRAVENOUS
  Filled 2023-04-09 (×11): qty 12.5

## 2023-04-09 MED ORDER — METRONIDAZOLE 500 MG/100ML IV SOLN
500.0000 mg | Freq: Two times a day (BID) | INTRAVENOUS | Status: DC
  Administered 2023-04-09 – 2023-04-10 (×2): 500 mg via INTRAVENOUS
  Filled 2023-04-09 (×2): qty 100

## 2023-04-09 MED ORDER — THIAMINE MONONITRATE 100 MG PO TABS
100.0000 mg | ORAL_TABLET | Freq: Every day | ORAL | Status: DC
Start: 2023-04-10 — End: 2023-04-13
  Administered 2023-04-10 – 2023-04-13 (×4): 100 mg
  Filled 2023-04-09 (×4): qty 1

## 2023-04-09 MED ORDER — LIDOCAINE 5 % EX PTCH
1.0000 | MEDICATED_PATCH | Freq: Every day | CUTANEOUS | Status: DC
  Administered 2023-04-10 – 2023-04-13 (×4): 1 via TRANSDERMAL
  Filled 2023-04-09 (×4): qty 1

## 2023-04-09 MED ORDER — SERTRALINE HCL 20 MG/ML PO CONC
100.0000 mg | Freq: Every day | ORAL | Status: AC
  Administered 2023-04-10 – 2023-04-11 (×2): 100 mg
  Filled 2023-04-09 (×2): qty 5

## 2023-04-09 MED ORDER — HYDROCERIN EX CREA
1.0000 | TOPICAL_CREAM | Freq: Two times a day (BID) | CUTANEOUS | Status: DC
  Administered 2023-04-09 – 2023-04-13 (×8): 1 via TOPICAL
  Filled 2023-04-09: qty 113

## 2023-04-09 MED ORDER — SODIUM CHLORIDE 0.9% FLUSH
3.0000 mL | Freq: Two times a day (BID) | INTRAVENOUS | Status: DC
  Administered 2023-04-09 – 2023-04-13 (×8): 3 mL via INTRAVENOUS

## 2023-04-09 MED ORDER — SODIUM CHLORIDE 0.9 % IV SOLN: Freq: Once | INTRAVENOUS | Status: AC

## 2023-04-09 MED ORDER — CHLORHEXIDINE GLUCONATE CLOTH 2 % EX PADS
6.0000 | MEDICATED_PAD | Freq: Every day | CUTANEOUS | Status: DC
  Administered 2023-04-10 – 2023-04-13 (×4): 6 via TOPICAL

## 2023-04-09 MED ORDER — METRONIDAZOLE 500 MG/100ML IV SOLN
500.0000 mg | Freq: Once | INTRAVENOUS | Status: AC
  Administered 2023-04-09: 500 mg via INTRAVENOUS
  Filled 2023-04-09: qty 100

## 2023-04-09 MED ORDER — POLYETHYLENE GLYCOL 3350 17 G PO PACK: 17.0000 g | PACK | Freq: Every day | ORAL | Status: DC | PRN

## 2023-04-09 MED ORDER — OSMOLITE 1.2 CAL PO LIQD
1000.0000 mL | ORAL | Status: DC
  Administered 2023-04-09: 1000 mL
  Filled 2023-04-09 (×3): qty 1000

## 2023-04-09 MED ORDER — ACETAMINOPHEN 325 MG PO TABS
650.0000 mg | ORAL_TABLET | Freq: Four times a day (QID) | ORAL | Status: DC | PRN
  Administered 2023-04-09: 650 mg
  Filled 2023-04-09: qty 2

## 2023-04-09 MED ORDER — METHOCARBAMOL 500 MG PO TABS
500.0000 mg | ORAL_TABLET | Freq: Four times a day (QID) | ORAL | Status: DC | PRN
  Administered 2023-04-09: 500 mg
  Filled 2023-04-09: qty 1

## 2023-04-09 MED ORDER — MEDIHONEY WOUND/BURN DRESSING EX PSTE
1.0000 | PASTE | Freq: Every day | CUTANEOUS | Status: DC
  Filled 2023-04-09: qty 44

## 2023-04-09 NOTE — Progress Notes (Signed)
Pharmacy Antibiotic Note  [name] is a 42 y.o. 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  Patient with a history of MS, PEG, chronic foley, HTN. Patient presenting with increased lethargy and concern for sepsis.  Patient on vancomycin 1250 mg q12h at facility last dose 12/9 at 0900. Was on linezolid prior. Patient also due to start zosyn but no doses received.  Patient received 1500mg  vancomycin dose in the ED 12/9 at 1513 (~6hr prior to last dose). It was initially believed that last vancomycin dose was 12/8. However, facility [facility], which is now available, also showing that they gave a dose of vancomycin last night and a 1250 mg dose was given last night at [facility] as well.  SCr 0.6 WBC 6.9; LA 2; T 100.5>98.6; HR 91; RR 18 COVID neg / flu neg  Plan: Would plan to resume vancomycin 1500mg  q12h for now (eAUC 484.6). However, given multiple charted doses will plan for a vancomycin level at 12hr post most recent dose to ensure we are not supra-therapeutic. Metronidazole per MD Cefepime 2g q8hr  Monitor WBC, fever, renal function, cultures De-escalate when able Levels at steady state  Weight: 80 kg (176 lb 5.9 oz)  Temp (24hrs), Avg:100.3 F (37.9 C), Min:99.8 F (37.7 C), Max:101 F (38.3 C)  Recent Labs  Lab 04/08/23 2155 04/09/23 1248 04/09/23 1309  WBC 6.4 6.9  --   CREATININE 0.62 0.95 0.60*  LATICACIDVEN  --   --  2.0*    Estimated Creatinine Clearance: 124.2 mL/min (A) (by C-G formula based on SCr of 0.6 mg/dL (L)).    No Known Allergies  Microbiology results: Pending  Thank you for allowing pharmacy to be a part of this patient's care.  Delmar Landau, PharmD, BCPS 04/09/2023 3:44 PM ED Clinical Pharmacist -  [phone]

## 2023-04-09 NOTE — Progress Notes (Addendum)
ED Pharmacy Antibiotic Sign Off An antibiotic consult was received from an ED provider for vancomycin and cefepime per pharmacy dosing for sepsis. A chart review was completed to assess appropriateness.   The following one time order(s) were placed:  Vancomycin 1500 mg IV x 1 Cefepime 2g IV x 1  Further antibiotic and/or antibiotic pharmacy consults should be ordered by the admitting provider if indicated.   Thank you for allowing pharmacy to be a part of this patient's care.   Daylene Posey, [facility]  Clinical Pharmacist 04/09/23 2:33 PM

## 2023-04-09 NOTE — Sepsis Progress Note (Signed)
Code Sepsis protocol being monitored by e[employer]. 

## 2023-04-09 NOTE — ED Provider Notes (Signed)
[place] EMERGENCY DEPARTMENT AT [facility] Provider Note  CSN: [id number] Arrival date & time: 04/09/23 1241  Chief Complaint(s) No chief complaint on file.  HPI [name] is a 42 y.o. male history of diabetes, multiple sclerosis, presenting with lethargic.  Patient noticed today to be weak, fatigued.  Sweaty.  Did have hyperglycemia with no history of diabetes.  Was febrile at facility.  Received 500 mL of normal saline.  Has been undergoing treatment for sacral wound with vancomycin, apparently grew MRSA.  He was at [facility] emergency department yesterday, at that time there was primarily concern for occluded PICC line, he was discharged back to facility.  History limited as patient is nonverbal.   Past Medical History Past Medical History:  Diagnosis Date   Acute respiratory failure with hypoxia (HCC) 02/01/2023   Depression    Diabetes mellitus without complication (HCC)    Eye abnormalities    unspecified by pt   Hypertension    Multiple sclerosis (HCC)    Severe sepsis due to aspiration pneumonia (HCC) 01/31/2023   Weakness    generalized   Patient Active Problem List   Diagnosis Date Noted   SIRS (systemic inflammatory response syndrome) (HCC) 04/09/2023   Malnutrition of moderate degree 02/02/2023   History of pulmonary embolus (PE) 02/01/2023   Hypernatremia 02/01/2023   Hypokalemia 02/01/2023   Dysphagia 02/01/2023   Muscle spasms of neck    Neuropathic pain    Vitamin B12 deficiency    Hypoalbuminemia    Urinary frequency    Tobacco abuse    Steroid-induced hyperglycemia    Marijuana abuse, continuous 04/12/2017   Vitamin D deficiency 04/12/2017   Multiple sclerosis pseudoexacerbation (HCC)    Abnormality of gait 05/10/2016   Hypertension    Depression    Multiple sclerosis (HCC) 03/02/2011   Home Medication(s) Prior to Admission medications   Medication Sig Start Date End Date Taking? Authorizing Provider  acetaminophen (TYLENOL)  325 MG tablet Place 650 mg into feeding tube in the morning and at bedtime. PRN order: take 650 mg per tube every 8 hours as needed for pain/fever   Yes [provider]  Amino Acids-Protein Hydrolys (PRO-STAT) LIQD Place 60 mLs into feeding tube in the morning and at bedtime.   Yes [provider]  calcium carbonate (TUMS - DOSED IN MG ELEMENTAL CALCIUM) 500 MG chewable tablet Place 1,000 mg into feeding tube every 8 (eight) hours as needed for indigestion or heartburn.   Yes [provider]  collagenase (SANTYL) 250 UNIT/GM ointment Apply 1 Application topically daily. Apply to sacral wound   Yes [provider]  DULCOLAX 10 MG suppository Place 10 mg rectally daily as needed (for constipation).   Yes [provider]  Emollient (EUCERIN INTENSIVE REPAIR EX) Apply 1 application  topically See admin instructions. Apply to face and body in the morning and at bedtime   Yes [provider]  enoxaparin (LOVENOX) 60 MG/0.6ML injection Inject 60 mg into the skin every 12 (twelve) hours.   Yes [provider]  Fingolimod HCl (GILENYA) 0.5 MG CAPS Place 0.5 mg into feeding tube daily.   Yes [provider]  gabapentin (NEURONTIN) 250 MG/5ML solution Place 2 mLs (100 mg total) into feeding tube daily. 02/06/23  Yes Danford, Earl Lites, MD  Heparin Sod, Pork, Lock Flush (HEPARIN FLUSH) 10 UNIT/ML SOLN injection 10 Units See admin instructions. Flush with 10 units every  shift   Yes [provider]  insulin aspart (NOVOLOG) 100 UNIT/ML injection Inject 0-10 Units into the skin in the morning, at noon, in the evening, and at bedtime. BS 60-150 inject 0 units  BS 151-199 inject 2 units  BS 200-249 inject 4 units  BS 250-299 inject 6 units  BS 300-349 inject 8 units  BS 350-399 inject 10 units  BS [phone] contact MD   Yes [provider]  Insulin Glargine (BASAGLAR KWIKPEN) 100 UNIT/ML Inject 10 Units into the skin at  bedtime.   Yes [provider]  lidocaine 4 % Place 1 patch onto the skin See admin instructions. Apply 1 patch to the lower back and lower extremities once a day- remove as directed   Yes [provider]  linezolid (ZYVOX) 600 MG/300ML IVPB Inject 600 mg into the vein every 12 (twelve) hours.   Yes [provider]  methocarbamol (ROBAXIN) 500 MG tablet Place 500 mg into feeding tube every 6 (six) hours as needed for muscle spasms.   Yes [provider]  Nutritional Supplements (FEEDING SUPPLEMENT, GLUCERNA 1.5 CAL,) LIQD Place 65 mL/hr into feeding tube continuous.   Yes [provider]  oxyCODONE-acetaminophen (PERCOCET/ROXICET) 5-325 MG tablet Place 1 tablet into feeding tube every 12 (twelve) hours. PRN order: take 1 tablet per tube every 6 hours as needed for pain   Yes [provider]  polyethylene glycol powder (GLYCOLAX/MIRALAX) 17 GM/SCOOP powder Place 17 g into feeding tube daily. 02/05/23  Yes Danford, Earl Lites, MD  potassium chloride 20 MEQ/15ML (10%) SOLN Place 7.5 mLs (10 mEq total) into feeding tube daily. 02/05/23  Yes Danford, Earl Lites, MD  PRESCRIPTION MEDICATION Place 2 drops under the tongue See admin instructions. Atropine 0.4 mg/mL 2 drops sublingual every 1 hour as needed for secretions   Yes [provider]  propranolol (INDERAL) 20 MG tablet Place 20 mg into feeding tube 2 (two) times daily.   Yes [provider]  sertraline (ZOLOFT) 20 MG/ML concentrated solution Place 5 mLs (100 mg total) into feeding tube daily. 02/05/23  Yes Danford, Earl Lites, MD  thiamine (VITAMIN B-1) 100 MG tablet Place 1 tablet (100 mg total) into feeding tube daily. 02/05/23  Yes Danford, Earl Lites, MD  vancomycin (VANCOREADY) 1250 MG/250ML SOLN Inject 1,250 mg into the vein every 12 (twelve) hours.   Yes [provider]  Water For Irrigation, Sterile (FREE WATER) SOLN Place 200 mLs into feeding tube every 4  (four) hours. Patient taking differently: Place 300 mLs into feeding tube every 4 (four) hours. 02/05/23  Yes Danford, Earl Lites, MD  apixaban (ELIQUIS) 5 MG TABS tablet Place 2 tablets (10 mg total) into feeding tube 2 (two) times daily for 6 days, THEN 1 tablet (5 mg total) 2 (two) times daily. Patient not taking: Reported on 04/09/2023 02/05/23 05/12/23  Alberteen Sam, MD  metroNIDAZOLE (FLAGYL) 500 MG tablet Take by mouth. Patient not taking: Reported on 04/09/2023    [provider]  mupirocin ointment (BACTROBAN) 2 % Apply 1 Application topically 2 (two) times daily. Apply to PEG tube site Patient not taking: Reported on 04/09/2023    [provider]  Piperacillin Sod-Tazobactam So 3-0.375 g SOLR Inject 3.375 g into the vein every 6 (six) hours. 7 day course Patient not taking: Reported on 04/09/2023    [provider]  sodium chloride irrigation 0.9 % irrigation Irrigate with as directed once. 125 ml/hr intravenously every 72 hours Patient not taking: Reported on 04/09/2023    [provider]                                                                                                                                    Past Surgical History Past Surgical History:  Procedure Laterality Date   LAPAROSCOPIC INSERTION GASTROSTOMY TUBE N/A 02/02/2023   Procedure: LAPAROSCOPIC INSERTION GASTROSTOMY TUBE;  Surgeon: Sheliah Hatch De Blanch, MD;  Location: [facility] ORS;  Service: General;  Laterality: N/A;   RADIOLOGY WITH ANESTHESIA N/A 11/11/2021   Procedure: MRI WITH ANESTHESIA BRAIN MRI WITH T SPINE AND CERVICAL SPINE W/O CONTRAST;  Surgeon: Radiologist, Medication, MD;  Location: [facility] OR;  Service: Radiology;  Laterality: N/A;   WISDOM TOOTH EXTRACTION     Family History Family History  Adopted: Yes  Problem Relation Age of Onset   Multiple sclerosis Cousin    Heart disease Mother    Other Father        Does not know his father.    Social  History Social History   Tobacco Use   Smoking status: Every Day    Current packs/day: 1.00    Types: Cigarettes   Smokeless tobacco: Never  Vaping Use   Vaping status: Never Used  Substance Use Topics   Alcohol use: Yes    Alcohol/week: 12.0 standard drinks of alcohol    Types: 12 Cans of beer per week   Drug use: Yes    Frequency: 30.0 times per week    Types: Marijuana    Comment: Daily   Allergies Patient has no known allergies.  Review of Systems Review of Systems  All other systems reviewed and are negative.   Physical Exam Vital Signs  I have reviewed the triage vital signs BP (!) 140/89   Pulse 92   Temp (!) 100.5 F (38.1 C) (Rectal)   Resp 17   Wt 80 kg   SpO2 97%   BMI 25.31 kg/m  Physical Exam Vitals and nursing note reviewed.  Constitutional:      General: He is not in acute distress.    Appearance: He is ill-appearing and diaphoretic.  HENT:     Mouth/Throat:     Mouth: Mucous membranes are dry.  Eyes:     Conjunctiva/sclera: Conjunctivae normal.  Cardiovascular:     Rate and Rhythm: Normal rate and regular rhythm.  Pulmonary:     Effort: Pulmonary effort is normal. No respiratory distress.     Breath sounds: Normal breath sounds.  Abdominal:     General: Abdomen is flat.     Palpations: Abdomen is soft.     Tenderness: There is no abdominal tenderness.  Genitourinary:    Comments: Large sacral wound present, approximately stage IV, with granulation tissue at the base, clean margins, no significant drainage or foul smell present Musculoskeletal:     Right lower leg: No edema.     Left lower leg: No edema.  Skin:    General:  Skin is warm.     Capillary Refill: Capillary refill takes less than 2 seconds.  Neurological:     Mental Status: He is alert.     Comments: Seems to be at baseline, very little movement other than nodding his head yes and no to questions.  No speech.  Psychiatric:        Mood and Affect: Mood normal.         Behavior: Behavior normal.     ED Results and Treatments Labs (all labs ordered are listed, but only abnormal results are displayed) Labs Reviewed  COMPREHENSIVE METABOLIC PANEL - Abnormal; Notable for the following components:      Result Value   Sodium 151 (*)    Chloride 112 (*)    Glucose, Bld 412 (*)    BUN 31 (*)    Calcium 8.8 (*)    Albumin 2.0 (*)    AST 43 (*)    ALT 54 (*)    All other components within normal limits  CBC WITH DIFFERENTIAL/PLATELET - Abnormal; Notable for the following components:   RBC 3.57 (*)    Hemoglobin 9.9 (*)    HCT 32.4 (*)    Platelets 407 (*)    All other components within normal limits  I-STAT CHEM 8, ED - Abnormal; Notable for the following components:   Sodium 150 (*)    Chloride 112 (*)    BUN 29 (*)    Creatinine, Ser 0.60 (*)    Glucose, Bld 440 (*)    Hemoglobin 10.5 (*)    HCT 31.0 (*)    All other components within normal limits  I-STAT CG4 LACTIC ACID, ED - Abnormal; Notable for the following components:   Lactic Acid, Venous 2.0 (*)    All other components within normal limits  CULTURE, BLOOD (ROUTINE X 2)  CULTURE, BLOOD (ROUTINE X 2)  RESP PANEL BY RT-PCR (RSV, FLU A&B, COVID)  RVPGX2  URINE CULTURE  URINALYSIS, W/ REFLEX TO CULTURE (INFECTION SUSPECTED)  I-STAT CG4 LACTIC ACID, ED                                                                                                                          Radiology DG Chest Portable 1 View  Result Date: 04/09/2023 CLINICAL DATA:  Chest pain.  Suspected sepsis. EXAM: PORTABLE CHEST 1 VIEW COMPARISON:  01/31/2023 FINDINGS: The heart size and mediastinal contours are within normal limits. Left arm PICC line is seen with tip overlying the SVC. Both lungs are clear. IMPRESSION: No active disease. Electronically Signed   By: Danae Orleans M.D.   On: 04/09/2023 14:42    Pertinent labs & imaging results that were available during my care of the patient were reviewed by me and  considered in my medical decision making (see MDM for details).  Medications Ordered in ED Medications  ceFEPIme (MAXIPIME) 2 g in sodium chloride 0.9 % 100 mL IVPB (2 g Intravenous New Bag/Given 04/09/23  1511)  metroNIDAZOLE (FLAGYL) IVPB 500 mg (500 mg Intravenous New Bag/Given 04/09/23 1516)  Vancomycin (VANCOCIN) 1,500 mg in sodium chloride 0.9 % 500 mL IVPB (1,500 mg Intravenous New Bag/Given 04/09/23 1513)  propranolol (INDERAL) tablet 20 mg (has no administration in time range)  Fingolimod HCl CAPS 0.5 mg (has no administration in time range)  sertraline (ZOLOFT) 20 MG/ML concentrated solution 100 mg (has no administration in time range)  enoxaparin (LOVENOX) injection 60 mg (has no administration in time range)  free water 300 mL (has no administration in time range)  gabapentin (NEURONTIN) 250 MG/5ML solution 100 mg (has no administration in time range)  methocarbamol (ROBAXIN) tablet 500 mg (has no administration in time range)  thiamine (VITAMIN B1) tablet 100 mg (has no administration in time range)  leptospermum manuka honey (MEDIHONEY) paste 1 Application (has no administration in time range)  Eucerin Intensive Repair LOTN (has no administration in time range)  lidocaine 4 % 1 patch (has no administration in time range)  sodium chloride flush (NS) 0.9 % injection 3 mL (has no administration in time range)  acetaminophen (TYLENOL) tablet 650 mg (has no administration in time range)    Or  acetaminophen (TYLENOL) suppository 650 mg (has no administration in time range)  polyethylene glycol (MIRALAX / GLYCOLAX) packet 17 g (has no administration in time range)  metroNIDAZOLE (FLAGYL) IVPB 500 mg (has no administration in time range)  feeding supplement (OSMOLITE 1.2 CAL) liquid 1,000 mL (has no administration in time range)  insulin aspart (novoLOG) injection 0-15 Units (has no administration in time range)  sodium chloride 0.9 % bolus 2,400 mL (0 mLs Intravenous Stopped 04/09/23  1522)  acetaminophen (TYLENOL) 160 MG/5ML solution 1,000 mg (1,000 mg Per Tube Given 04/09/23 1346)  0.9 %  sodium chloride infusion ( Intravenous New Bag/Given 04/09/23 1523)                                                                                                                                     Procedures .Critical Care  Performed by: Lonell Grandchild, MD Authorized by: Lonell Grandchild, MD   Critical care provider statement:    Critical care time (minutes):  30   Critical care was necessary to treat or prevent imminent or life-threatening deterioration of the following conditions:  Sepsis   Critical care was time spent personally by me on the following activities:  Development of treatment plan with patient or surrogate, discussions with consultants, evaluation of patient's response to treatment, examination of patient, ordering and review of laboratory studies, ordering and review of radiographic studies, ordering and performing treatments and interventions, pulse oximetry, re-evaluation of patient's condition and review of old charts   Care discussed with: admitting provider     (including critical care time)  Medical Decision Making / ED Course   MDM:  42 year old presenting to the emergency department with fever and weakness.  On exam,  patient is slightly ill-appearing although no acute distress.  He is febrile in the emergency department.  Suspect underlying infection.  Urinary catheter was exchanged and urine off new catheter is very turbid, urinalysis and urine culture sent, concern for possible UTI.  Differential also includes pneumonia, however lungs are clear on exam and chest x-ray is clear.  Patient seems to be at his neurologic baseline per prior notes.  He does have a large sacral wound, could definitely be source of infection, does has granulation tissue at base with no foul smell and no excessive drainage .  Viral panel is pending.  Ultimately patient will  need to be admitted for further antibiotic therapy and diagnostics.  Discussed with admitting hospitalist Dr. Alinda Money who will accept the patient.      Additional history obtained: -Additional history obtained from ems -External records from outside source obtained and reviewed including: Chart review including previous notes, labs, imaging, consultation notes including ER visit from yesterday    Lab Tests: -I ordered, reviewed, and interpreted labs.   The pertinent results include:   Labs Reviewed  COMPREHENSIVE METABOLIC PANEL - Abnormal; Notable for the following components:      Result Value   Sodium 151 (*)    Chloride 112 (*)    Glucose, Bld 412 (*)    BUN 31 (*)    Calcium 8.8 (*)    Albumin 2.0 (*)    AST 43 (*)    ALT 54 (*)    All other components within normal limits  CBC WITH DIFFERENTIAL/PLATELET - Abnormal; Notable for the following components:   RBC 3.57 (*)    Hemoglobin 9.9 (*)    HCT 32.4 (*)    Platelets 407 (*)    All other components within normal limits  I-STAT CHEM 8, ED - Abnormal; Notable for the following components:   Sodium 150 (*)    Chloride 112 (*)    BUN 29 (*)    Creatinine, Ser 0.60 (*)    Glucose, Bld 440 (*)    Hemoglobin 10.5 (*)    HCT 31.0 (*)    All other components within normal limits  I-STAT CG4 LACTIC ACID, ED - Abnormal; Notable for the following components:   Lactic Acid, Venous 2.0 (*)    All other components within normal limits  CULTURE, BLOOD (ROUTINE X 2)  CULTURE, BLOOD (ROUTINE X 2)  RESP PANEL BY RT-PCR (RSV, FLU A&B, COVID)  RVPGX2  URINE CULTURE  URINALYSIS, W/ REFLEX TO CULTURE (INFECTION SUSPECTED)  I-STAT CG4 LACTIC ACID, ED    Notable for elevated lactacte, mild hypernatremia   Imaging Studies ordered: I ordered imaging studies including CXR On my interpretation imaging demonstrates no acute process I independently visualized and interpreted imaging. I agree with the radiologist  interpretation   Medicines ordered and prescription drug management: Meds ordered this encounter  Medications   sodium chloride 0.9 % bolus 2,400 mL   acetaminophen (TYLENOL) 160 MG/5ML solution 1,000 mg   DISCONTD: lactated ringers infusion   ceFEPIme (MAXIPIME) 2 g in sodium chloride 0.9 % 100 mL IVPB    Order Specific Question:   Antibiotic Indication:    Answer:   Other Indication (list below)    Order Specific Question:   Other Indication:    Answer:   Unknown source   metroNIDAZOLE (FLAGYL) IVPB 500 mg    Order Specific Question:   Antibiotic Indication:    Answer:   Other Indication (list below)  Order Specific Question:   Other Indication:    Answer:   Unknown source   DISCONTD: vancomycin (VANCOCIN) IVPB 1000 mg/200 mL premix    Order Specific Question:   Indication:    Answer:   Other Indication (list below)    Order Specific Question:   Other Indication:    Answer:   Unknown source   DISCONTD: Vancomycin (VANCOCIN) 1,500 mg in sodium chloride 0.9 % 500 mL IVPB    Order Specific Question:   Indication:    Answer:   Other Indication (list below)    Order Specific Question:   Other Indication:    Answer:   Unknown source   Vancomycin (VANCOCIN) 1,500 mg in sodium chloride 0.9 % 500 mL IVPB    Order Specific Question:   Indication:    Answer:   Other Indication (list below)    Order Specific Question:   Other Indication:    Answer:   treatment received at nursing facility, mrsa   0.9 %  sodium chloride infusion   propranolol (INDERAL) tablet 20 mg   Fingolimod HCl CAPS 0.5 mg   sertraline (ZOLOFT) 20 MG/ML concentrated solution 100 mg   enoxaparin (LOVENOX) injection 60 mg   free water 300 mL   gabapentin (NEURONTIN) 250 MG/5ML solution 100 mg   methocarbamol (ROBAXIN) tablet 500 mg   thiamine (VITAMIN B1) tablet 100 mg   leptospermum manuka honey (MEDIHONEY) paste 1 Application   Eucerin Intensive Repair LOTN   lidocaine 4 % 1 patch    Apply 1 patch to the  lower back and lower extremities once a day- remove as directed     sodium chloride flush (NS) 0.9 % injection 3 mL   OR Linked Order Group    acetaminophen (TYLENOL) tablet 650 mg    acetaminophen (TYLENOL) suppository 650 mg   polyethylene glycol (MIRALAX / GLYCOLAX) packet 17 g   metroNIDAZOLE (FLAGYL) IVPB 500 mg    Order Specific Question:   Antibiotic Indication:    Answer:   Other Indication (list below)    Order Specific Question:   Other Indication:    Answer:   Unknown source   feeding supplement (OSMOLITE 1.2 CAL) liquid 1,000 mL   insulin aspart (novoLOG) injection 0-15 Units    Order Specific Question:   Correction coverage:    Answer:   Moderate (average weight, post-op)    Order Specific Question:   CBG < 70:    Answer:   Implement Hypoglycemia Standing Orders and refer to Hypoglycemia Standing Orders sidebar report    Order Specific Question:   CBG 70 - 120:    Answer:   0 units    Order Specific Question:   CBG 121 - 150:    Answer:   2 units    Order Specific Question:   CBG 151 - 200:    Answer:   3 units    Order Specific Question:   CBG 201 - 250:    Answer:   5 units    Order Specific Question:   CBG 251 - 300:    Answer:   8 units    Order Specific Question:   CBG 301 - 350:    Answer:   11 units    Order Specific Question:   CBG 351 - 400:    Answer:   15 units    Order Specific Question:   CBG > 400    Answer:   call MD and obtain STAT lab  verification    -I have reviewed the patients home medicines and have made adjustments as needed   Consultations Obtained: I requested consultation with the hospitalist,  and discussed lab and imaging findings as well as pertinent plan - they recommend: admission   Cardiac Monitoring: The patient was maintained on a cardiac monitor.  I personally viewed and interpreted the cardiac monitored which showed an underlying rhythm of: NSR  Reevaluation: After the interventions noted above, I reevaluated the patient and  found that their symptoms have improved  Co morbidities that complicate the patient evaluation  Past Medical History:  Diagnosis Date   Acute respiratory failure with hypoxia (HCC) 02/01/2023   Depression    Diabetes mellitus without complication (HCC)    Eye abnormalities    unspecified by pt   Hypertension    Multiple sclerosis (HCC)    Severe sepsis due to aspiration pneumonia (HCC) 01/31/2023   Weakness    generalized      Dispostion: Disposition decision including need for hospitalization was considered, and patient admitted to the hospital.    Final Clinical Impression(s) / ED Diagnoses Final diagnoses:  Sepsis, due to unspecified organism, unspecified whether acute organ dysfunction present [facility])     This chart was dictated using voice recognition software.  Despite best efforts to proofread,  errors can occur which can change the documentation meaning.    Lonell Grandchild, MD 04/09/23 1537

## 2023-04-09 NOTE — ED Triage Notes (Addendum)
Pt BIB [facility] from [facility] for Sepsis concerns.  He was diaphoretic on scene.  Pt is more lethargic than normal.   PT at [facility] last night, given Rochepin and returned to facility.     CBG 527 w/o hx of diabeties,   ETC02 43 HR 97 146/94 Oral temp of 100.4 at facility. Hot to touch.   EMS gave 500 mL of NS  Has sacral wound with MRSA. He has had 14 days of ABX at facility.  Hx of MS

## 2023-04-09 NOTE — H&P (Signed)
History and Physical   [name] ZOX:[id number] DOB: 11/25/1980 DOA: 04/09/2023  PCP: Lonie Peak, PA-C   Patient coming from: [facility] (SNF)  Chief Complaint: Lethargy, concern for sepsis  HPI: [name] is a 42 y.o. male with medical history significant of multiple sclerosis, PEG, chronic Foley, nonverbal status, hypertension, depression presenting with increased lethargy and concern for sepsis.  History obtained assistance of chart review given patient's nonverbal status that he can communicate some with nods.  Patient reportedly was more lethargic today at facility.  Patient has been receiving broad-spectrum antibiotics at facility with MRSA coverage for reported sacral wound infection positive for MRSA.  Remains on vancomycin.  Patient was seen yesterday and was along for occluded PICC line and IV was placed and patient's workup there was reassuring to his discharge back to facility with IV in place while awaiting new PICC line.  Now presenting with increased lethargy and concern for sepsis.  Patient unable to fully participate in review of systems.  ED Course: Vital signs in the ED notable for fever to 100.5, blood pressure in the 130s 140 systolic, heart rate in the 90s to 100s.  Lab workup included CMP with sodium 151, chloride 112, BUN 31, glucose 412, calcium 8.8, albumin 2.0, AST 43, ALT 54.  CBC with hemoglobin stable at 9.9, platelets 407.  Lactic acid mildly elevated 2.0 and repeat pending.  Respiratory panel for flu COVID RSV pending.  Urinalysis pending.  Blood cultures pending.  Chest x-ray showed no acute Gershon Mussel.  Patient seen vancomycin, cefepime, Flagyl in the ED.  Also received dose of Tylenol, 2.5 L IV fluids and started on a rate of IV fluids.  Review of Systems: Full review symptoms limited by patients non verbal status be he agrees with history obtained by chart review.  Past Medical History:  Diagnosis Date   Acute respiratory failure  with hypoxia (HCC) 02/01/2023   Depression    Diabetes mellitus without complication (HCC)    Eye abnormalities    unspecified by pt   Hypertension    Multiple sclerosis (HCC)    Severe sepsis due to aspiration pneumonia (HCC) 01/31/2023   Weakness    generalized    Past Surgical History:  Procedure Laterality Date   LAPAROSCOPIC INSERTION GASTROSTOMY TUBE N/A 02/02/2023   Procedure: LAPAROSCOPIC INSERTION GASTROSTOMY TUBE;  Surgeon: Rodman Pickle, MD;  Location: [facility];  Service: General;  Laterality: N/A;   RADIOLOGY WITH ANESTHESIA N/A 11/11/2021   Procedure: MRI WITH ANESTHESIA BRAIN MRI WITH T SPINE AND CERVICAL SPINE W/O CONTRAST;  Surgeon: Radiologist, Medication, MD;  Location: [facility] OR;  Service: Radiology;  Laterality: N/A;   WISDOM TOOTH EXTRACTION      Social History  reports that he has been smoking cigarettes. He has never used smokeless tobacco. He reports current alcohol use of about 12.0 standard drinks of alcohol per week. He reports current drug use. Frequency: 30.00 times per week. Drug: Marijuana.  No Known Allergies  Family History  Adopted: Yes  Problem Relation Age of Onset   Multiple sclerosis Cousin    Heart disease Mother    Other Father        Does not know his father.  Reviewed on admission  Prior to Admission medications   Medication Sig Start Date End Date Taking? Authorizing Provider  acetaminophen (TYLENOL) 325 MG tablet Place 650 mg into feeding tube in the morning and at bedtime. PRN order: take 650 mg per tube every  8 hours as needed for pain/fever   Yes [provider]  Amino Acids-Protein Hydrolys (PRO-STAT) LIQD Place 60 mLs into feeding tube in the morning and at bedtime.   Yes [provider]  calcium carbonate (TUMS - DOSED IN MG ELEMENTAL CALCIUM) 500 MG chewable tablet Place 1,000 mg into feeding tube every 8 (eight) hours as needed for indigestion or heartburn.   Yes [provider]  collagenase  (SANTYL) 250 UNIT/GM ointment Apply 1 Application topically daily. Apply to sacral wound   Yes [provider]  DULCOLAX 10 MG suppository Place 10 mg rectally daily as needed (for constipation).   Yes [provider]  Emollient (EUCERIN INTENSIVE REPAIR EX) Apply 1 application  topically See admin instructions. Apply to face and body in the morning and at bedtime   Yes [provider]  enoxaparin (LOVENOX) 60 MG/0.6ML injection Inject 60 mg into the skin every 12 (twelve) hours.   Yes [provider]  Fingolimod HCl (GILENYA) 0.5 MG CAPS Place 0.5 mg into feeding tube daily.   Yes [provider]  gabapentin (NEURONTIN) 250 MG/5ML solution Place 2 mLs (100 mg total) into feeding tube daily. 02/06/23  Yes Danford, Earl Lites, MD  Heparin Sod, Pork, Lock Flush (HEPARIN FLUSH) 10 UNIT/ML SOLN injection 10 Units See admin instructions. Flush with 10 units every  shift   Yes [provider]  insulin aspart (NOVOLOG) 100 UNIT/ML injection Inject 0-10 Units into the skin in the morning, at noon, in the evening, and at bedtime. BS 60-150 inject 0 units  BS 151-199 inject 2 units  BS 200-249 inject 4 units  BS 250-299 inject 6 units  BS 300-349 inject 8 units  BS 350-399 inject 10 units  BS [phone] contact MD   Yes [provider]  Insulin Glargine (BASAGLAR KWIKPEN) 100 UNIT/ML Inject 10 Units into the skin at bedtime.   Yes [provider]  lidocaine 4 % Place 1 patch onto the skin See admin instructions. Apply 1 patch to the lower back and lower extremities once a day- remove as directed   Yes [provider]  linezolid (ZYVOX) 600 MG/300ML IVPB Inject 600 mg into the vein every 12 (twelve) hours.   Yes [provider]  methocarbamol (ROBAXIN) 500 MG tablet Place 500 mg into feeding tube every 6 (six) hours as needed for muscle spasms.   Yes [provider]  Nutritional Supplements (FEEDING SUPPLEMENT,  GLUCERNA 1.5 CAL,) LIQD Place 65 mL/hr into feeding tube continuous.   Yes [provider]  oxyCODONE-acetaminophen (PERCOCET/ROXICET) 5-325 MG tablet Place 1 tablet into feeding tube every 12 (twelve) hours. PRN order: take 1 tablet per tube every 6 hours as needed for pain   Yes [provider]  polyethylene glycol powder (GLYCOLAX/MIRALAX) 17 GM/SCOOP powder Place 17 g into feeding tube daily. 02/05/23  Yes Danford, Earl Lites, MD  potassium chloride 20 MEQ/15ML (10%) SOLN Place 7.5 mLs (10 mEq total) into feeding tube daily. 02/05/23  Yes Danford, Earl Lites, MD  PRESCRIPTION MEDICATION Place 2 drops under the tongue See admin instructions. Atropine 0.4 mg/mL 2 drops sublingual every 1 hour as needed for secretions   Yes [provider]  propranolol (INDERAL) 20 MG tablet Place 20 mg into feeding tube 2 (two) times daily.   Yes [provider]  sertraline (ZOLOFT) 20 MG/ML concentrated solution Place 5 mLs (100 mg total) into feeding tube daily. 02/05/23  Yes Danford, Earl Lites, MD  thiamine (VITAMIN  B-1) 100 MG tablet Place 1 tablet (100 mg total) into feeding tube daily. 02/05/23  Yes Danford, Earl Lites, MD  vancomycin (VANCOREADY) 1250 MG/250ML SOLN Inject 1,250 mg into the vein every 12 (twelve) hours.   Yes [provider]  Water For Irrigation, Sterile (FREE WATER) SOLN Place 200 mLs into feeding tube every 4 (four) hours. Patient taking differently: Place 300 mLs into feeding tube every 4 (four) hours. 02/05/23  Yes Danford, Earl Lites, MD  apixaban (ELIQUIS) 5 MG TABS tablet Place 2 tablets (10 mg total) into feeding tube 2 (two) times daily for 6 days, THEN 1 tablet (5 mg total) 2 (two) times daily. Patient not taking: Reported on 04/09/2023 02/05/23 05/12/23  Alberteen Sam, MD  metroNIDAZOLE (FLAGYL) 500 MG tablet Take by mouth. Patient not taking: Reported on 04/09/2023    [provider]  mupirocin ointment  (BACTROBAN) 2 % Apply 1 Application topically 2 (two) times daily. Apply to PEG tube site Patient not taking: Reported on 04/09/2023    [provider]  Nutritional Supplements (FEEDING SUPPLEMENT, OSMOLITE 1.5 CAL,) LIQD Place 1,000 mLs into feeding tube daily. Patient not taking: Reported on 04/09/2023 02/06/23   Alberteen Sam, MD  Piperacillin Sod-Tazobactam So 3-0.375 g SOLR Inject 3.375 g into the vein every 6 (six) hours. 7 day course Patient not taking: Reported on 04/09/2023    [provider]  Protein (FEEDING SUPPLEMENT, PROSOURCE TF20,) liquid Place 60 mLs into feeding tube 2 (two) times daily. Patient not taking: Reported on 04/09/2023 02/05/23   Alberteen Sam, MD  sodium chloride 0.9 % infusion Inject 500 mL/hr into the vein once. Patient not taking: Reported on 04/09/2023    [provider]  sodium chloride irrigation 0.9 % irrigation Irrigate with as directed once. 125 ml/hr intravenously every 72 hours Patient not taking: Reported on 04/09/2023    [provider]    Physical Exam: Vitals:   04/09/23 1345 04/09/23 1400 04/09/23 1430 04/09/23 1500  BP: (!) 149/106 (!) 149/93 (!) 146/90 (!) 140/89  Pulse: 95 94 96 92  Resp: 19 18 18 17   Temp:      TempSrc:      SpO2: 98% 98% 100% 97%  Weight:        Physical Exam Constitutional:      General: He is not in acute distress.    Comments: Chronically ill male, bedbound, PEG and Foley catheter in place.  Nods to answer questions.  HENT:     Head: Normocephalic and atraumatic.     Mouth/Throat:     Mouth: Mucous membranes are moist.     Pharynx: Oropharynx is clear.  Eyes:     Extraocular Movements: Extraocular movements intact.     Pupils: Pupils are equal, round, and reactive to light.  Cardiovascular:     Rate and Rhythm: Normal rate and regular rhythm.     Pulses: Normal pulses.     Heart sounds: Normal heart sounds.  Pulmonary:     Effort: Pulmonary effort is normal.  No respiratory distress.     Breath sounds: Normal breath sounds.  Abdominal:     General: Bowel sounds are normal. There is no distension.     Palpations: Abdomen is soft.     Tenderness: There is no abdominal tenderness.  Musculoskeletal:        General: No swelling or deformity.  Skin:    General: Skin is warm and dry.  Neurological:     Mental  Status: Mental status is at baseline.     Comments: Nods to questions appropriately     Labs on Admission: I have personally reviewed following labs and imaging studies  CBC: Recent Labs  Lab 04/08/23 2155 04/09/23 1248 04/09/23 1309  WBC 6.4 6.9  --   NEUTROABS  --  5.5  --   HGB 10.0* 9.9* 10.5*  HCT 32.2* 32.4* 31.0*  MCV 91.0 90.8  --   PLT 362 407*  --     Basic Metabolic Panel: Recent Labs  Lab 04/08/23 2155 04/09/23 1248 04/09/23 1309  NA 147* 151* 150*  K 4.1 4.0 4.1  CL 107 112* 112*  CO2 31 27  --   GLUCOSE 282* 412* 440*  BUN 42* 31* 29*  CREATININE 0.62 0.95 0.60*  CALCIUM 9.2 8.8*  --     GFR: Estimated Creatinine Clearance: 124.2 mL/min (A) (by C-G formula based on SCr of 0.6 mg/dL (L)).  Liver Function Tests: Recent Labs  Lab 04/09/23 1248  AST 43*  ALT 54*  ALKPHOS 87  BILITOT 0.5  PROT 7.2  ALBUMIN 2.0*    Urine analysis:    Component Value Date/Time   COLORURINE AMBER (A) 01/31/2023 1458   APPEARANCEUR CLOUDY (A) 01/31/2023 1458   APPEARANCEUR Clear 07/26/2016 1015   LABSPEC 1.027 01/31/2023 1458   PHURINE 5.0 01/31/2023 1458   GLUCOSEU NEGATIVE 01/31/2023 1458   HGBUR SMALL (A) 01/31/2023 1458   BILIRUBINUR NEGATIVE 01/31/2023 1458   BILIRUBINUR Negative 07/26/2016 1015   KETONESUR 5 (A) 01/31/2023 1458   PROTEINUR 30 (A) 01/31/2023 1458   UROBILINOGEN 1.0 02/26/2011 1750   NITRITE NEGATIVE 01/31/2023 1458   LEUKOCYTESUR SMALL (A) 01/31/2023 1458    Radiological Exams on Admission: DG Chest Portable 1 View  Result Date: 04/09/2023 CLINICAL DATA:  Chest pain.  Suspected  sepsis. EXAM: PORTABLE CHEST 1 VIEW COMPARISON:  01/31/2023 FINDINGS: The heart size and mediastinal contours are within normal limits. Left arm PICC line is seen with tip overlying the SVC. Both lungs are clear. IMPRESSION: No active disease. Electronically Signed   By: Danae Orleans M.D.   On: 04/09/2023 14:42    EKG: Not performed in emergency department  Assessment/Plan Principal Problem:   SIRS (systemic inflammatory response syndrome) (HCC) Active Problems:   Multiple sclerosis (HCC)   Hypertension   Depression   History of pulmonary embolus (PE)   SIRS > Presenting with increased lethargy while on vancomycin for sacral wound. (Chart indicates he had been on the order was due to be on linezolid at some point also was due to start Zosyn but had not done so yet so no gram-negative coverage but did complete Flagyl in the last week.) > Meet SIRS criteria with fever, tachycardia, no source identified yet but had turbid urine, urinalysis still pending.  Chest x-ray without acute abnormality. > No prior urine cultures and no definitive source while awaiting urinalysis the patient has been continued on vancomycin with addition of cefepime and Flagyl in the ED.  Also received 2.5 L of IV fluids. - Monitor on telemetry - Continue with vancomycin, cefepime, Flagyl for now - Urinalysis, urine culture if abnormal - Follow-up blood cultures - Follow-up respiratory panel for flu COVID and RSV, could consider full viral panel given lack of leukocytosis. - Supportive care  Hyponatremia > Chronic hyponatremia managed with free water through PEG tube.  Sodium currently 151. - Continue with free water 300 mL every 4 hours via PEG.  Multiple sclerosis >  Progressive disease.  Now was largely nonverbal and not to answer questions.  Also status post PEG due to progressive dysphagia in October.  Also with chronic Foley. - Continue fingolimod - Continue home gabapentin - Continue as needed Robaxin - Tube  feeding, consult to nutrition, Osmolite ordered for now - Supportive care  Hypertension - Blood pressure 130s to 140s in the ED - Continue home propranolol  Depression - Continue home sertraline  Diabetes - SSI  History of PE - Recently transitioned to treatment dose Lovenox  DVT prophylaxis: Lovenox Code Status:   Full Family Communication:  None on admission. Patient gave thumbs up that he understands plan   Disposition Plan:   Patient is from:  SNF  Anticipated DC to:  Same as above  Anticipated DC date:  1 to 7 days  Anticipated DC barriers: None  Consults called:  None Admission status:  Observation, telemetry  Severity of Illness: The appropriate patient status for this patient is OBSERVATION. Observation status is judged to be reasonable and necessary in order to provide the required intensity of service to ensure the patient's safety. The patient's presenting symptoms, physical exam findings, and initial radiographic and laboratory data in the context of their medical condition is felt to place them at decreased risk for further clinical deterioration. Furthermore, it is anticipated that the patient will be medically stable for discharge from the hospital within 2 midnights of admission.    Synetta Fail MD [facility]  How to contact the [facility] Attending or Consulting provider 7A - 7P or covering provider during after hours 7P -7A, for this patient?   Check the care team in [facility] and look for a) attending/consulting [facility] provider listed and b) the [facility] team listed Log into [email or web address] and use [place]'s universal password to access. If you do not have the password, please contact the hospital operator. Locate the [facility] provider you are looking for under [facility] and page to a number that you can be directly reached. If you still have difficulty reaching the provider, please page the [facility] (Director on Call) for the Hospitalists listed on amion for  assistance.  04/09/2023, 3:31 PM

## 2023-04-09 NOTE — ED Notes (Signed)
ED TO INPATIENT HANDOFF REPORT  ED Nurse Name and Phone #: JOsh  S Name/Age/Gender Val Eagle 41 y.o. male Room/Bed: 033C/033C  Code Status   Code Status: Full Code  Home/SNF/Other Skilled nursing facility PT is alert. Pretty sure he is oriented x 4 but he has MS and has trouble talking so it is difficult to know for sure  Triage Complete: Triage complete  Chief Complaint SIRS (systemic inflammatory response syndrome) (HCC) [R65.10]  Triage Note Pt BIB [facility] from [facility] for Sepsis concerns.  He was diaphoretic on scene.  Pt is more lethargic than normal.   PT at [facility] last night, given Rochepin and returned to facility.     CBG 527 w/o hx of diabeties,   ETC02 43 HR 97 146/94 Oral temp of 100.4 at facility. Hot to touch.   EMS gave 500 mL of NS  Has sacral wound with MRSA. He has had 14 days of ABX at facility.  Hx of MS   Allergies No Known Allergies  Level of Care/Admitting Diagnosis ED Disposition     ED Disposition  Admit   Condition  --   Comment  Hospital Area: [facility] [[id number]]  Level of Care: Telemetry Medical [[id number]]  May place patient in observation at [facility] or [facility] if equivalent level of care is available:: No  Covid Evaluation: Asymptomatic - no recent exposure (last 10 days) testing not required  Diagnosis: SIRS (systemic inflammatory response syndrome) [facility] : [facility]) [[id number]]  Admitting Physician: Synetta Fail [[id number]]  Attending Physician: Synetta Fail [[id number]]          B Medical/Surgery History Past Medical History:  Diagnosis Date   Acute respiratory failure with hypoxia (HCC) 02/01/2023   Depression    Diabetes mellitus without complication (HCC)    Eye abnormalities    unspecified by pt   Hypertension    Multiple sclerosis (HCC)    Severe sepsis due to aspiration pneumonia (HCC) 01/31/2023   Weakness    generalized   Past Surgical History:  Procedure Laterality Date    LAPAROSCOPIC INSERTION GASTROSTOMY TUBE N/A 02/02/2023   Procedure: LAPAROSCOPIC INSERTION GASTROSTOMY TUBE;  Surgeon: Rodman Pickle, MD;  Location: [facility] ORS;  Service: General;  Laterality: N/A;   RADIOLOGY WITH ANESTHESIA N/A 11/11/2021   Procedure: MRI WITH ANESTHESIA BRAIN MRI WITH T SPINE AND CERVICAL SPINE W/O CONTRAST;  Surgeon: Radiologist, Medication, MD;  Location: [facility] OR;  Service: Radiology;  Laterality: N/A;   WISDOM TOOTH EXTRACTION       A IV Location/Drains/Wounds Patient Lines/Drains/Airways Status     Active Line/Drains/Airways     Name Placement date Placement time Site Days   Peripheral IV 04/09/23 18 G Anterior;Left Forearm 04/09/23  1251  Forearm  less than 1   Peripheral IV 04/09/23 18 G Posterior;Right Forearm 04/09/23  1253  Forearm  less than 1   Gastrostomy/Enterostomy PEG-jejunostomy LUQ 02/02/23  1313  LUQ  66   Incision - 3 Ports Abdomen 1: Left;Mid 2: Right;Mid 3: Right;Upper 02/02/23  1305  -- 66            Intake/Output Last 24 hours  Intake/Output Summary (Last 24 hours) at 04/09/2023 1555 Last data filed at 04/09/2023 1551 Gross per 24 hour  Intake 2500 ml  Output --  Net 2500 ml    Labs/Imaging Results for orders placed or performed during the hospital encounter of 04/09/23 (from the past 48 hour(s))  Comprehensive metabolic panel  Status: Abnormal   Collection Time: 04/09/23 12:48 PM  Result Value Ref Range   Sodium 151 (H) 135 - 145 mmol/L   Potassium 4.0 3.5 - 5.1 mmol/L   Chloride 112 (H) 98 - 111 mmol/L   CO2 27 22 - 32 mmol/L   Glucose, Bld 412 (H) 70 - 99 mg/dL    Comment: Glucose reference range applies only to samples taken after fasting for at least 8 hours.   BUN 31 (H) 6 - 20 mg/dL   Creatinine, Ser 9.32 0.61 - 1.24 mg/dL   Calcium 8.8 (L) 8.9 - 10.3 mg/dL   Total Protein 7.2 6.5 - 8.1 g/dL   Albumin 2.0 (L) 3.5 - 5.0 g/dL   AST 43 (H) 15 - 41 U/L   ALT 54 (H) 0 - 44 U/L   Alkaline Phosphatase 87 38 - 126 U/L    Total Bilirubin 0.5 <1.2 mg/dL   GFR, Estimated >35 >57 mL/min    Comment: (NOTE) Calculated using the CKD-EPI Creatinine Equation (2021)    Anion gap 12 5 - 15    Comment: Performed at [facility], [address], [place], [place] [address]  CBC with Differential     Status: Abnormal   Collection Time: 04/09/23 12:48 PM  Result Value Ref Range   WBC 6.9 4.0 - 10.5 K/uL   RBC 3.57 (L) 4.22 - 5.81 MIL/uL   Hemoglobin 9.9 (L) 13.0 - 17.0 g/dL   HCT 54.2 (L) 70.6 - 23.7 %   MCV 90.8 80.0 - 100.0 fL   MCH 27.7 26.0 - 34.0 pg   MCHC 30.6 30.0 - 36.0 g/dL   RDW 62.8 31.5 - 17.6 %   Platelets 407 (H) 150 - 400 K/uL   nRBC 0.0 0.0 - 0.2 %   Neutrophils Relative % 80 %   Neutro Abs 5.5 1.7 - 7.7 K/uL   Lymphocytes Relative 11 %   Lymphs Abs 0.8 0.7 - 4.0 K/uL   Monocytes Relative 8 %   Monocytes Absolute 0.6 0.1 - 1.0 K/uL   Eosinophils Relative 1 %   Eosinophils Absolute 0.0 0.0 - 0.5 K/uL   Basophils Relative 0 %   Basophils Absolute 0.0 0.0 - 0.1 K/uL   Immature Granulocytes 0 %   Abs Immature Granulocytes 0.03 0.00 - 0.07 K/uL    Comment: Performed at [facility], [address], [place], [place] [address]  I-stat chem 8, ED     Status: Abnormal   Collection Time: 04/09/23  1:09 PM  Result Value Ref Range   Sodium 150 (H) 135 - 145 mmol/L   Potassium 4.1 3.5 - 5.1 mmol/L   Chloride 112 (H) 98 - 111 mmol/L   BUN 29 (H) 6 - 20 mg/dL   Creatinine, Ser 7.10 (L) 0.61 - 1.24 mg/dL   Glucose, Bld 626 (H) 70 - 99 mg/dL    Comment: Glucose reference range applies only to samples taken after fasting for at least 8 hours.   Calcium, Ion 1.19 1.15 - 1.40 mmol/L   TCO2 30 22 - 32 mmol/L   Hemoglobin 10.5 (L) 13.0 - 17.0 g/dL   HCT 94.8 (L) 54.6 - 27.0 %  I-Stat Lactic Acid     Status: Abnormal   Collection Time: 04/09/23  1:09 PM  Result Value Ref Range   Lactic Acid, Venous 2.0 (HH) 0.5 - 1.9 mmol/L   Comment NOTIFIED PHYSICIAN    DG Chest Portable 1 View  Result  Date: 04/09/2023 CLINICAL DATA:  Chest pain.  Suspected sepsis. EXAM: PORTABLE CHEST 1 VIEW COMPARISON:  01/31/2023 FINDINGS: The heart size and mediastinal contours are within normal limits. Left arm PICC line is seen with tip overlying the SVC. Both lungs are clear. IMPRESSION: No active disease. Electronically Signed   By: Danae Orleans M.D.   On: 04/09/2023 14:42    Pending Labs Unresulted Labs (From admission, onward)     Start     Ordered   04/10/23 0500  CBC  Tomorrow morning,   R        04/09/23 1527   04/10/23 0500  Comprehensive metabolic panel  Tomorrow morning,   R        04/09/23 1527   04/09/23 1457  Urine Culture  Once,   URGENT       Question Answer Comment  Indication Altered mental status (if no other cause identified)   Release to patient Immediate      04/09/23 1456   04/09/23 1300  Resp panel by RT-PCR (RSV, Flu A&B, Covid) Anterior Nasal Swab  Once,   URGENT        04/09/23 1300   04/09/23 1300  Urinalysis, w/ Reflex to Culture (Infection Suspected) -Urine, Clean Catch  Once,   URGENT       Question:  Specimen Source  Answer:  Urine, Clean Catch   04/09/23 1300   04/09/23 1259  Culture, blood (routine x 2)  BLOOD CULTURE X 2,   R (with STAT occurrences)      04/09/23 1300            Vitals/Pain Today's Vitals   04/09/23 1345 04/09/23 1400 04/09/23 1430 04/09/23 1500  BP: (!) 149/106 (!) 149/93 (!) 146/90 (!) 140/89  Pulse: 95 94 96 92  Resp: 19 18 18 17   Temp:      TempSrc:      SpO2: 98% 98% 100% 97%  Weight:        Isolation Precautions No active isolations  Medications Medications  metroNIDAZOLE (FLAGYL) IVPB 500 mg (500 mg Intravenous New Bag/Given 04/09/23 1516)  Vancomycin (VANCOCIN) 1,500 mg in sodium chloride 0.9 % 500 mL IVPB (1,500 mg Intravenous New Bag/Given 04/09/23 1513)  propranolol (INDERAL) tablet 20 mg (has no administration in time range)  Fingolimod HCl CAPS 0.5 mg (has no administration in time range)  sertraline (ZOLOFT) 20  MG/ML concentrated solution 100 mg (has no administration in time range)  free water 300 mL (has no administration in time range)  gabapentin (NEURONTIN) 250 MG/5ML solution 100 mg (has no administration in time range)  methocarbamol (ROBAXIN) tablet 500 mg (has no administration in time range)  thiamine (VITAMIN B1) tablet 100 mg (has no administration in time range)  leptospermum manuka honey (MEDIHONEY) paste 1 Application (has no administration in time range)  Eucerin Intensive Repair LOTN (has no administration in time range)  lidocaine (LIDODERM) 5 % 1 patch (has no administration in time range)  sodium chloride flush (NS) 0.9 % injection 3 mL (3 mLs Intravenous Not Given 04/09/23 1551)  acetaminophen (TYLENOL) tablet 650 mg (has no administration in time range)    Or  acetaminophen (TYLENOL) suppository 650 mg (has no administration in time range)  polyethylene glycol (MIRALAX / GLYCOLAX) packet 17 g (has no administration in time range)  metroNIDAZOLE (FLAGYL) IVPB 500 mg (has no administration in time range)  feeding supplement (OSMOLITE 1.2 CAL) liquid 1,000 mL (has no administration in time range)  insulin aspart (  novoLOG) injection 0-15 Units (has no administration in time range)  enoxaparin (LOVENOX) injection 80 mg (has no administration in time range)  sodium chloride 0.9 % bolus 2,400 mL (0 mLs Intravenous Stopped 04/09/23 1522)  acetaminophen (TYLENOL) 160 MG/5ML solution 1,000 mg (1,000 mg Per Tube Given 04/09/23 1346)  ceFEPIme (MAXIPIME) 2 g in sodium chloride 0.9 % 100 mL IVPB (0 g Intravenous Stopped 04/09/23 1551)  0.9 %  sodium chloride infusion ( Intravenous New Bag/Given 04/09/23 1523)    Mobility non-ambulatory     Focused Assessments    R Recommendations: See Admitting Provider Note  Report given to:   Additional Notes:

## 2023-04-10 ENCOUNTER — Encounter (HOSPITAL_COMMUNITY): Payer: Self-pay | Admitting: Family Medicine

## 2023-04-10 ENCOUNTER — Inpatient Hospital Stay (HOSPITAL_COMMUNITY): Payer: Medicare Other

## 2023-04-10 DIAGNOSIS — L89626 Pressure-induced deep tissue damage of left heel: Secondary | ICD-10-CM | POA: Diagnosis present

## 2023-04-10 DIAGNOSIS — L89154 Pressure ulcer of sacral region, stage 4: Secondary | ICD-10-CM | POA: Diagnosis present

## 2023-04-10 DIAGNOSIS — Z794 Long term (current) use of insulin: Secondary | ICD-10-CM | POA: Diagnosis not present

## 2023-04-10 DIAGNOSIS — R471 Dysarthria and anarthria: Secondary | ICD-10-CM | POA: Diagnosis present

## 2023-04-10 DIAGNOSIS — Z86711 Personal history of pulmonary embolism: Secondary | ICD-10-CM | POA: Diagnosis not present

## 2023-04-10 DIAGNOSIS — E876 Hypokalemia: Secondary | ICD-10-CM | POA: Diagnosis present

## 2023-04-10 DIAGNOSIS — G35 Multiple sclerosis: Secondary | ICD-10-CM

## 2023-04-10 DIAGNOSIS — E440 Moderate protein-calorie malnutrition: Secondary | ICD-10-CM | POA: Diagnosis present

## 2023-04-10 DIAGNOSIS — E871 Hypo-osmolality and hyponatremia: Secondary | ICD-10-CM | POA: Diagnosis not present

## 2023-04-10 DIAGNOSIS — M4628 Osteomyelitis of vertebra, sacral and sacrococcygeal region: Secondary | ICD-10-CM | POA: Diagnosis present

## 2023-04-10 DIAGNOSIS — E1140 Type 2 diabetes mellitus with diabetic neuropathy, unspecified: Secondary | ICD-10-CM | POA: Diagnosis present

## 2023-04-10 DIAGNOSIS — L89616 Pressure-induced deep tissue damage of right heel: Secondary | ICD-10-CM | POA: Diagnosis present

## 2023-04-10 DIAGNOSIS — E870 Hyperosmolality and hypernatremia: Secondary | ICD-10-CM

## 2023-04-10 DIAGNOSIS — D649 Anemia, unspecified: Secondary | ICD-10-CM

## 2023-04-10 DIAGNOSIS — I10 Essential (primary) hypertension: Secondary | ICD-10-CM

## 2023-04-10 DIAGNOSIS — R41841 Cognitive communication deficit: Secondary | ICD-10-CM | POA: Diagnosis present

## 2023-04-10 DIAGNOSIS — Z931 Gastrostomy status: Secondary | ICD-10-CM | POA: Diagnosis not present

## 2023-04-10 DIAGNOSIS — R6510 Systemic inflammatory response syndrome (SIRS) of non-infectious origin without acute organ dysfunction: Secondary | ICD-10-CM | POA: Diagnosis not present

## 2023-04-10 DIAGNOSIS — Y838 Other surgical procedures as the cause of abnormal reaction of the patient, or of later complication, without mention of misadventure at the time of the procedure: Secondary | ICD-10-CM | POA: Diagnosis present

## 2023-04-10 DIAGNOSIS — F32A Depression, unspecified: Secondary | ICD-10-CM | POA: Diagnosis present

## 2023-04-10 DIAGNOSIS — R7401 Elevation of levels of liver transaminase levels: Secondary | ICD-10-CM | POA: Diagnosis present

## 2023-04-10 DIAGNOSIS — G8220 Paraplegia, unspecified: Secondary | ICD-10-CM | POA: Diagnosis present

## 2023-04-10 DIAGNOSIS — E1165 Type 2 diabetes mellitus with hyperglycemia: Secondary | ICD-10-CM | POA: Diagnosis present

## 2023-04-10 DIAGNOSIS — G629 Polyneuropathy, unspecified: Secondary | ICD-10-CM | POA: Diagnosis present

## 2023-04-10 DIAGNOSIS — T82594A Other mechanical complication of infusion catheter, initial encounter: Secondary | ICD-10-CM | POA: Diagnosis present

## 2023-04-10 DIAGNOSIS — Z1152 Encounter for screening for COVID-19: Secondary | ICD-10-CM | POA: Diagnosis not present

## 2023-04-10 DIAGNOSIS — R1310 Dysphagia, unspecified: Secondary | ICD-10-CM | POA: Diagnosis present

## 2023-04-10 LAB — COMPREHENSIVE METABOLIC PANEL
ALT: 42 U/L (ref 0–44)
AST: 29 U/L (ref 15–41)
Albumin: 1.7 g/dL — ABNORMAL LOW (ref 3.5–5.0)
Alkaline Phosphatase: 65 U/L (ref 38–126)
Anion gap: 9 (ref 5–15)
BUN: 19 mg/dL (ref 6–20)
CO2: 25 mmol/L (ref 22–32)
Calcium: 8.5 mg/dL — ABNORMAL LOW (ref 8.9–10.3)
Chloride: 115 mmol/L — ABNORMAL HIGH (ref 98–111)
Creatinine, Ser: 0.43 mg/dL — ABNORMAL LOW (ref 0.61–1.24)
GFR, Estimated: >60 mL/min (ref >60)
Glucose, Bld: 228 mg/dL — ABNORMAL HIGH (ref 70–99)
Potassium: 3.5 mmol/L (ref 3.5–5.1)
Sodium: 149 mmol/L — ABNORMAL HIGH (ref 135–145)
Total Bilirubin: 0.3 mg/dL (ref <1.2)
Total Protein: 5.9 g/dL — ABNORMAL LOW (ref 6.5–8.1)

## 2023-04-10 LAB — CBC
HCT: 28.5 % — ABNORMAL LOW (ref 39.0–52.0)
Hemoglobin: 8.8 g/dL — ABNORMAL LOW (ref 13.0–17.0)
MCH: 28.1 pg (ref 26.0–34.0)
MCHC: 30.9 g/dL (ref 30.0–36.0)
MCV: 91.1 fL (ref 80.0–100.0)
Platelets: 322 10*3/uL (ref 150–400)
RBC: 3.13 MIL/uL — ABNORMAL LOW (ref 4.22–5.81)
RDW: 15.2 % (ref 11.5–15.5)
WBC: 4.9 10*3/uL (ref 4.0–10.5)
nRBC: 0 % (ref 0.0–0.2)

## 2023-04-10 LAB — GLUCOSE, CAPILLARY
Glucose-Capillary: 173 mg/dL — ABNORMAL HIGH (ref 70–99)
Glucose-Capillary: 201 mg/dL — ABNORMAL HIGH (ref 70–99)
Glucose-Capillary: 217 mg/dL — ABNORMAL HIGH (ref 70–99)
Glucose-Capillary: 226 mg/dL — ABNORMAL HIGH (ref 70–99)
Glucose-Capillary: 226 mg/dL — ABNORMAL HIGH (ref 70–99)
Glucose-Capillary: 253 mg/dL — ABNORMAL HIGH (ref 70–99)

## 2023-04-10 LAB — VANCOMYCIN, RANDOM: Vancomycin Rm: 8 ug/mL

## 2023-04-10 LAB — MRSA NEXT GEN BY PCR, NASAL: MRSA by PCR Next Gen: NOT DETECTED

## 2023-04-10 MED ORDER — OSMOLITE 1.5 CAL PO LIQD
1000.0000 mL | ORAL | Status: DC
Start: 2023-04-10 — End: 2023-04-13
  Administered 2023-04-10 – 2023-04-13 (×2): 1000 mL
  Filled 2023-04-10 (×5): qty 1000

## 2023-04-10 MED ORDER — DAKINS (1/4 STRENGTH) 0.125 % EX SOLN
CUTANEOUS | Status: DC
  Filled 2023-04-10: qty 473

## 2023-04-10 MED ORDER — FREE WATER
300.0000 mL | Status: DC
  Administered 2023-04-10 – 2023-04-13 (×18): 300 mL

## 2023-04-10 MED ORDER — INSULIN GLARGINE-YFGN 100 UNIT/ML ~~LOC~~ SOLN
8.0000 [IU] | Freq: Every day | SUBCUTANEOUS | Status: DC
  Administered 2023-04-10 – 2023-04-13 (×3): 8 [IU] via SUBCUTANEOUS
  Filled 2023-04-10 (×4): qty 0.08

## 2023-04-10 MED ORDER — FREE WATER
200.0000 mL | Status: DC
  Administered 2023-04-10: 200 mL

## 2023-04-10 MED ORDER — PROSOURCE TF20 ENFIT COMPATIBL EN LIQD
60.0000 mL | Freq: Every day | ENTERAL | Status: DC
  Administered 2023-04-10 – 2023-04-13 (×4): 60 mL
  Filled 2023-04-10 (×4): qty 60

## 2023-04-10 MED ORDER — VANCOMYCIN HCL IN DEXTROSE 1-5 GM/200ML-% IV SOLN
1000.0000 mg | Freq: Three times a day (TID) | INTRAVENOUS | Status: DC
  Administered 2023-04-10: 1000 mg via INTRAVENOUS
  Filled 2023-04-10 (×2): qty 200

## 2023-04-10 MED ORDER — MEDIHONEY WOUND/BURN DRESSING EX PSTE
1.0000 | PASTE | Freq: Every day | CUTANEOUS | Status: DC
  Filled 2023-04-10: qty 44

## 2023-04-10 NOTE — Assessment & Plan Note (Signed)
Unclear source.  Had aspiration pneumonia 6 weeks ago, CXR here clear. BCx NGTD.  Sacral wound actually looks clean.  Urine with 80K PsA, this may be the source. - Continue cefepime - Stop Vancomycin and Flagyl - Obtain MRI pelvis to eval for osteomyelitis - Consult to ID, appreciate cares

## 2023-04-10 NOTE — Assessment & Plan Note (Signed)
Baseline Hgb 12, here down to 8s.  No observed bleeding. Suspect this is due to underlying infection. Hgb stable, no observed bleeding -  Trend Hgb

## 2023-04-10 NOTE — Progress Notes (Addendum)
Pharmacy Antibiotic Note  [name] is a 42 y.o. 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  Vancomycin level 8 this morning   Plan: Vancomycin 1000 mg IV q8h  Weight: 73 kg (160 lb 15 oz)  Temp (24hrs), Avg:99.7 F (37.6 C), Min:98.6 F (37 C), Max:101 F (38.3 C)  Recent Labs  Lab 04/08/23 2155 04/09/23 1248 04/09/23 1309 04/10/23 0536  WBC 6.4 6.9  --  4.9  CREATININE 0.62 0.95 0.60*  --   LATICACIDVEN  --   --  2.0*  --   VANCORANDOM  --   --   --  8    Estimated Creatinine Clearance: 124.2 mL/min (A) (by C-G formula based on SCr of 0.6 mg/dL (L)).    No Known Allergies  Microbiology results: Pending  Geannie Risen, PharmD, BCPS

## 2023-04-10 NOTE — Assessment & Plan Note (Addendum)
Patient had a PEG placed for nutrition 6 weeks ago.  Since then, by our measurements, he has lost another 10 pounds.  Family are trying to get collateral information.  Tube feed rate is at goal.   - Continue Osmolite, Prosource - Consult nutrition

## 2023-04-10 NOTE — Hospital Course (Signed)
42 y.o. M with relapsing remitting MS on fingolimod, wheelchair/bedbound and nonverbal at baseline, lives in LTC, and HTN who presented with fever.  Had developed fever at facility few days prior to admission, surface swab of sacral wound growing MRSA, started on vancomycin via PICC.    Did not improve and so was sent to the hospital.

## 2023-04-10 NOTE — Inpatient Diabetes Management (Signed)
Inpatient Diabetes Program Recommendations  AACE/ADA: New Consensus Statement on Inpatient Glycemic Control (2015)  Target Ranges:  Prepandial:   less than 140 mg/dL      Peak postprandial:   less than 180 mg/dL (1-2 hours)      Critically ill patients:  140 - 180 mg/dL   Lab Results  Component Value Date   GLUCAP 253 (H) 04/10/2023   HGBA1C 4.9 11/08/2021    Review of Glycemic Control  Latest Reference Range & Units 04/09/23 20:05 04/10/23 00:00 04/10/23 04:07 04/10/23 08:34  Glucose-Capillary 70 - 99 mg/dL 010 (H) 272 (H) 536 (H) 253 (H)  (H): Data is abnormally high Diabetes history: Type 2 DM Outpatient Diabetes medications: Basaglar 10 units at bedtime, Novolog 0-10 units TID Current orders for Inpatient glycemic control: Novolog 0-15 units Q4H Osmolite @ 50 ml/hr  Inpatient Diabetes Program Recommendations:    Consider: -A1C? -Novolog 3 units Q4H for tube feed coverage (to be stopped or held in the event tube feeds are stopped) -Semglee 8 units every day  Thanks, Lujean Rave, MSN, RNC-OB Diabetes Coordinator [phone] (8a-5p)

## 2023-04-10 NOTE — Progress Notes (Signed)
Initial Nutrition Assessment  DOCUMENTATION CODES:   Not applicable  INTERVENTION:  Initiate tube feeding via PEG: Osmolite 1.5 at 60 ml/h (1440 ml per day) Prosource TF20 60 ml daily Free water flush; every 4 hours.   Provides 2240 kcal, 110 gm protein, 2297 ml free water daily (includes FWF)    NUTRITION DIAGNOSIS:   Increased nutrient needs related to acute illness as evidenced by estimated needs.    GOAL:   Patient will meet greater than or equal to 90% of their needs    MONITOR:   TF tolerance  REASON FOR ASSESSMENT:   Consult Enteral/tube feeding initiation and management  ASSESSMENT:   42 y.o. M, Presented from long term care facility, increased lethargy, and concerns for sepsis. PMH; MS, HTN, Depression, DM, Severe sepsis, acute respiratory failure, PEG. Patient resting no verbal.  Appeared well nourished. All information obtained through EMR and team. Current EN not meeting needs. Will change see interventions.  Current EN via PEG;  Osmolite 1.2 at 75ml/hr w/ FWF every four hours Provides; 1440 kcal, 67 g pro, FW including FWF meeting ~ 65% of EEN.   Admit weight: 80 kg  Current weight: 73 kg  Weight history; 04/10/23 73 kg  02/02/23 78.5 kg  11/11/21 78.2 kg      Average Meal Intake: NPO  Nutritionally Relevant Medications: Scheduled Meds:  Fingolimod HCl  0.5 mg Per Tube Daily   free water  300 mL Per Tube Q4H   gabapentin  100 mg Per Tube Daily   sertraline  100 mg Per Tube Daily   thiamine  100 mg Per Tube Daily   vancomycin variable dose per unstable renal function (pharmacist dosing)   Does not apply See admin instructions    Continuous Infusions:  feeding supplement (OSMOLITE 1.2 CAL) 1,000 mL (04/09/23 1731)   metronidazole 500 mg (04/09/23 2152)   vancomycin 1,000 mg (04/10/23 0839)    Labs Reviewed: Na; 149 H, Cl; 115H CBG ranges from 228-440 mg/dL over the last 24 hours    NUTRITION - FOCUSED PHYSICAL  EXAM:  Flowsheet Row Most Recent Value  Orbital Region No depletion  Upper Arm Region No depletion  Thoracic and Lumbar Region No depletion  Buccal Region No depletion  Temple Region No depletion  Clavicle Bone Region No depletion  Clavicle and Acromion Bone Region No depletion  Scapular Bone Region No depletion  Dorsal Hand No depletion  Patellar Region No depletion  Anterior Thigh Region No depletion  Posterior Calf Region No depletion  Edema (RD Assessment) None  Hair Reviewed  Eyes Reviewed  Mouth Reviewed  Skin Reviewed  Nails Reviewed       Diet Order:   Diet Order             Diet NPO time specified  Diet effective now                   EDUCATION NEEDS:   Not appropriate for education at this time  Skin:  Skin Assessment: Skin Integrity Issues: Skin Integrity Issues:: Stage IV, DTI DTI: DTI pressure, R heel Stage IV: Sacrum  Last BM:  12/9  Height:   Ht Readings from Last 1 Encounters:  02/02/23 5\' 10"  (1.778 m)    Weight:   Wt Readings from Last 1 Encounters:  04/10/23 73 kg    Ideal Body Weight:     BMI:  Body mass index is 23.09 kg/m.  Estimated Nutritional Needs:   Kcal:  2200-2575 kcal  Protein:  95-110 g/d  Fluid:  61ml/kcal    Jamelle Haring RDN, LDN Clinical Dietitian  Pleas see Amion for contact information

## 2023-04-10 NOTE — Assessment & Plan Note (Signed)
Sodium slightly better - Increase free water

## 2023-04-10 NOTE — Assessment & Plan Note (Signed)
-  Continue propranolol 

## 2023-04-10 NOTE — Assessment & Plan Note (Addendum)
Recently transition from apixaban to Lovenox, unclear reason - Continue home Lovenox

## 2023-04-10 NOTE — Consult Note (Addendum)
WOC Nurse Consult Note: Reason for Consult: Requested to Assess Sacrum and both heels wounds. Wound type: Deep pressure injury on both heels. Stage 4 pressure injury on sacrum Perform remotely after assess photos and notes. Pressure Injury POA: Yes  Measurement: Aprox. 3x3cm L heel Aprox. 5x5cm R heel Aprox. 10x9cm Sacrum, appears has a tunneling 3 o`clock.  Wound bed: Both heels dark/purple skin without disruption. Sacrum: 90% Yellow/brown tissue, 10% red.  Dressing procedure/placement/frequency: Both heels, use Prevalon, cover the skin injury with foam dressing, change the position each 2 hours. Sacrum: Initiate treatment with Dakin moisture gauze, apply by wound bed and inside the tunneling if is present, cover with ABD pad. Change daily starting today, until Sunday (12/15). After Lisette Grinder expires on Sunday, begin apply MediHoney everyday, cover with ABD pad.  1400 - Assess the sacrum wound by person with dr. Maryfrances Bunnell, after secure chat. Notice a bone exposition in the wound middle. The physician will refer to a infection disease team.  WOC team will not plan to follow further.  Please reconsult if further assistance is needed. Thank-you,  Denyse Amass BSN, RN, [employer], WOC  (Pager: [phone])

## 2023-04-10 NOTE — Progress Notes (Signed)
Progress Note   Patient: [name]:[id number] DOB: 12-08-1980 DOA: 04/09/2023     0 DOS: the patient was seen and examined on 04/10/2023 at 11:06AM      Brief hospital course: 42 y.o. M with relapsing remitting MS on fingolimod, wheelchair/bedbound and nonverbal at baseline, lives in LTC, and HTN who presented with fever.  Had developed fever at facility few days prior to admission, surface swab of sacral wound growing MRSA, started on vancomycin via PICC.    Did not improve and so was sent to the hospital.     Assessment and Plan: * SIRS (systemic inflammatory response syndrome) (HCC) Unclear source.  Had aspiration pneumonia 6 weeks ago, CXR here clear. BCx NGTD.  Sacral wound actually looks clean.  Urine with 80K PsA, this may be the source. - Continue cefepime - Stop Vancomycin and Flagyl - Obtain MRI pelvis to eval for osteomyelitis - Consult to ID, appreciate cares  Sacral decubitus ulcer, stage IV (HCC) Present on admission.  Evaluated with WOC team, the coccyx is palpable in the wound bed.  Discussed with ID, given his poor functional status, progressive neurological disease, malnutrition and 10 lbs weight loss since placement of PEG 6 weeks ago, long term antibiotics for an osteomyelitis of the pelvis would be likely futile at best, causing harm at worst.    A strategy of reactive treatment of symptoms would be also reasonable. - MRI pelvis - WOC for daily wound dressing - Air loss mattress - ID consult  Normocytic anemia Baseline Hgb 12, here down to 8s.  No observed bleeding. Suspect this is due to underlying infection -  Trend Hgb  Malnutrition of moderate degree Patient had a PEG placed for nutrition 6 weeks ago.  Since then, by our measurements, he has lost another 10 pounds.  Family are trying to get collateral information - Continue Osmolite, Prosource  Hypernatremia Sodium slightly better - Increase free water  History of pulmonary embolus  (PE) Recently transition from apixaban to Lovenox, unclear reason - Continue home Lovenox  Depression - Continue sertraline  Hypertension - Continue propranolol  Multiple sclerosis (HCC) Fingolimod unavailable in hospital. - Resume Gilenya at discharge          Subjective: Patient is nonverbal.  However he shakes his head no to pain.  Shrugs his shoulders when asked about generalized malaise or weakness.  Otherwise unable to articulate symptoms.  Nursing of no concerns.     Physical Exam: BP (!) 144/87 (BP Location: Right Arm)   Pulse 94   Temp 99.2 F (37.3 C) (Oral)   Resp 18   Wt 73 kg   SpO2 100%   BMI 23.09 kg/m   Thin adult male, lying in bed, stigmata of chronic multiple sclerosis, nonverbal, makes eye contact Tachycardic, regular, no murmurs, no peripheral edema Normal respiratory rate and rhythm, lungs clear without rales or wheezes Abdomen soft no tenderness palpation or guarding Attention normal, makes eye contact, spastic in all 4 extremities, tremor noted, nonverbal Wound evaluated with new care nursing, no obvious drainage or discharge, the coccyx is palpable in the wound bed      Data Reviewed: Discussed with infectious disease Patient metabolic panel shows improving hypernatremia Glucose down to the 200s Hemoglobin down to 8.8 Creatinine normal Albumin 1.7 Urinalysis with Pseudomonas     Family Communication: Called to brother, no answer, called to sister-in-law, answered and able to connect with brother on 3-way; discussed current plan for continued treatment of Pseudomonas, likely  discharge home tomorrow, also discussed longer-term prognosis if osteomyelitis are present.  Family desire not to reenter hospice at this time, but were encouraged to enroll in outpatient palliative care at discharge      Disposition: Status is: Inpatient Patient has end-stage multiple sclerosis with PEG tube  He was admitted for fever, possible sepsis,  which appears to be from urinary source  In the meantime, he also has a large sacral wound that probes to bone.  We will get an MRI of this, and involve infectious disease, although I do not believe treatment is likely to be successful if he did have osteomyelitis.          Author: Alberteen Sam, MD 04/10/2023 2:57 PM  For on call review [email or web address].

## 2023-04-10 NOTE — Assessment & Plan Note (Signed)
Fingolimod unavailable in hospital. - Resume Gilenya at discharge

## 2023-04-10 NOTE — Assessment & Plan Note (Signed)
Continue sertraline 

## 2023-04-10 NOTE — Progress Notes (Signed)
Transition of Care [facility]) - Inpatient Brief Assessment   Patient Details  Name: [name] MRN: [id number] Date of Birth: 01/21/1981  Transition of Care [facility]) CM/SW Contact:    Janae Bridgeman, RN Phone Number: 04/10/2023, 10:24 AM   Clinical Narrative: Patient admitted to the hospital from [facility] for SIRS/UTI.  Patient with history of MS and has current wound on buttocks per bedside nursing.    TOC Team will continue to follow the patient for TOC needs and likely return to SNF for care when medically stable.   Transition of Care Asessment: Insurance and Status: (P) Insurance coverage has been reviewed Patient has primary care physician: (P) Yes Home environment has been reviewed: (P) from [facility] Prior level of function:: (P) assistance at nursing facility   Social Determinants of Health Reivew: (P) SDOH reviewed needs interventions Readmission risk has been reviewed: (P) Yes Transition of care needs: (P) transition of care needs identified, TOC will continue to follow

## 2023-04-10 NOTE — Assessment & Plan Note (Signed)
Present on admission.  Evaluated with WOC team, the coccyx is palpable in the wound bed.  Discussed with ID, given his poor functional status, progressive neurological disease, malnutrition and 10 lbs weight loss since placement of PEG 6 weeks ago, long term antibiotics for an osteomyelitis of the pelvis would be likely futile at best, causing harm at worst.    A strategy of reactive treatment of symptoms would be also reasonable. - MRI pelvis - WOC for daily wound dressing - Air loss mattress - ID consult

## 2023-04-11 ENCOUNTER — Inpatient Hospital Stay (HOSPITAL_COMMUNITY): Payer: Medicare Other

## 2023-04-11 DIAGNOSIS — F32A Depression, unspecified: Secondary | ICD-10-CM | POA: Diagnosis not present

## 2023-04-11 DIAGNOSIS — R6510 Systemic inflammatory response syndrome (SIRS) of non-infectious origin without acute organ dysfunction: Secondary | ICD-10-CM | POA: Diagnosis not present

## 2023-04-11 DIAGNOSIS — L89154 Pressure ulcer of sacral region, stage 4: Secondary | ICD-10-CM | POA: Diagnosis not present

## 2023-04-11 DIAGNOSIS — Z86711 Personal history of pulmonary embolism: Secondary | ICD-10-CM | POA: Diagnosis not present

## 2023-04-11 LAB — COMPREHENSIVE METABOLIC PANEL
ALT: 32 U/L (ref 0–44)
AST: 21 U/L (ref 15–41)
Albumin: 1.7 g/dL — ABNORMAL LOW (ref 3.5–5.0)
Alkaline Phosphatase: 59 U/L (ref 38–126)
Anion gap: 5 (ref 5–15)
BUN: 15 mg/dL (ref 6–20)
CO2: 27 mmol/L (ref 22–32)
Calcium: 8.5 mg/dL — ABNORMAL LOW (ref 8.9–10.3)
Chloride: 107 mmol/L (ref 98–111)
Creatinine, Ser: 0.49 mg/dL — ABNORMAL LOW (ref 0.61–1.24)
GFR, Estimated: >60 mL/min (ref >60)
Glucose, Bld: 228 mg/dL — ABNORMAL HIGH (ref 70–99)
Potassium: 3.2 mmol/L — ABNORMAL LOW (ref 3.5–5.1)
Sodium: 139 mmol/L (ref 135–145)
Total Bilirubin: 0.5 mg/dL (ref <1.2)
Total Protein: 5.9 g/dL — ABNORMAL LOW (ref 6.5–8.1)

## 2023-04-11 LAB — URINE CULTURE: Culture: 80000 — AB

## 2023-04-11 LAB — MAGNESIUM: Magnesium: 1.5 mg/dL — ABNORMAL LOW (ref 1.7–2.4)

## 2023-04-11 LAB — GLUCOSE, CAPILLARY
Glucose-Capillary: 212 mg/dL — ABNORMAL HIGH (ref 70–99)
Glucose-Capillary: 213 mg/dL — ABNORMAL HIGH (ref 70–99)
Glucose-Capillary: 222 mg/dL — ABNORMAL HIGH (ref 70–99)
Glucose-Capillary: 224 mg/dL — ABNORMAL HIGH (ref 70–99)
Glucose-Capillary: 247 mg/dL — ABNORMAL HIGH (ref 70–99)

## 2023-04-11 LAB — CBC
HCT: 28.2 % — ABNORMAL LOW (ref 39.0–52.0)
Hemoglobin: 8.8 g/dL — ABNORMAL LOW (ref 13.0–17.0)
MCH: 28.2 pg (ref 26.0–34.0)
MCHC: 31.2 g/dL (ref 30.0–36.0)
MCV: 90.4 fL (ref 80.0–100.0)
Platelets: 311 10*3/uL (ref 150–400)
RBC: 3.12 MIL/uL — ABNORMAL LOW (ref 4.22–5.81)
RDW: 14.9 % (ref 11.5–15.5)
WBC: 5.6 10*3/uL (ref 4.0–10.5)
nRBC: 0 % (ref 0.0–0.2)

## 2023-04-11 MED ORDER — INSULIN ASPART 100 UNIT/ML IJ SOLN
3.0000 [IU] | INTRAMUSCULAR | Status: DC
Start: 2023-04-11 — End: 2023-04-13
  Administered 2023-04-11 – 2023-04-13 (×11): 3 [IU] via SUBCUTANEOUS

## 2023-04-11 MED ORDER — ORAL CARE MOUTH RINSE
15.0000 mL | OROMUCOSAL | Status: DC
  Administered 2023-04-11 – 2023-04-13 (×10): 15 mL via OROMUCOSAL

## 2023-04-11 MED ORDER — ORAL CARE MOUTH RINSE: 15.0000 mL | OROMUCOSAL | Status: DC | PRN

## 2023-04-11 MED ORDER — SERTRALINE HCL 100 MG PO TABS
100.0000 mg | ORAL_TABLET | Freq: Every day | ORAL | Status: DC
  Administered 2023-04-12 – 2023-04-13 (×2): 100 mg
  Filled 2023-04-11 (×2): qty 1

## 2023-04-11 MED ORDER — POTASSIUM CHLORIDE 20 MEQ PO PACK
60.0000 meq | PACK | Freq: Once | ORAL | Status: AC
  Administered 2023-04-11: 60 meq
  Filled 2023-04-11: qty 3

## 2023-04-11 MED ORDER — SODIUM CHLORIDE 0.9% FLUSH
10.0000 mL | Freq: Two times a day (BID) | INTRAVENOUS | Status: DC
  Administered 2023-04-11 – 2023-04-13 (×5): 10 mL

## 2023-04-11 MED ORDER — GADOBUTROL 1 MMOL/ML IV SOLN
7.0000 mL | Freq: Once | INTRAVENOUS | Status: AC | PRN
  Administered 2023-04-11: 7 mL via INTRAVENOUS

## 2023-04-11 MED ORDER — MAGNESIUM SULFATE 2 GM/50ML IV SOLN
2.0000 g | Freq: Once | INTRAVENOUS | Status: AC
  Administered 2023-04-11: 2 g via INTRAVENOUS
  Filled 2023-04-11: qty 50

## 2023-04-11 NOTE — Inpatient Diabetes Management (Signed)
Inpatient Diabetes Program Recommendations  AACE/ADA: New Consensus Statement on Inpatient Glycemic Control (2015)  Target Ranges:  Prepandial:   less than 140 mg/dL      Peak postprandial:   less than 180 mg/dL (1-2 hours)      Critically ill patients:  140 - 180 mg/dL   Lab Results  Component Value Date   GLUCAP 247 (H) 04/11/2023   HGBA1C 4.9 11/08/2021    Review of Glycemic Control  Latest Reference Range & Units 04/10/23 16:42 04/10/23 20:27 04/11/23 00:14 04/11/23 03:53 04/11/23 07:42  Glucose-Capillary 70 - 99 mg/dL 161 (H) 096 (H) 045 (H) 213 (H) 247 (H)  (H): Data is abnormally high Diabetes history: Type 2 DM Outpatient Diabetes medications: Basaglar 10 units at bedtime, Novolog 0-10 units TID Current orders for Inpatient glycemic control: Novolog 0-15 units Q4H Osmolite @ 50 ml/hr   Inpatient Diabetes Program Recommendations:     Consider: -A1C? -Novolog 3 units Q4H for tube feed coverage (to be stopped or held in the event tube feeds are stopped)   Thanks, Lujean Rave, MSN, RNC-OB Diabetes Coordinator [phone] (8a-5p)

## 2023-04-11 NOTE — Progress Notes (Signed)
Progress Note   Patient: [name] [id number]:[id number] DOB: 12-01-1980 DOA: 04/09/2023     1 DOS: the patient was seen and examined on 04/11/2023        Brief hospital course: 42 y.o. M with relapsing remitting MS on fingolimod, wheelchair/bedbound and nonverbal at baseline, lives in LTC, and HTN who presented with fever.  Had developed fever at facility few days prior to admission, surface swab of sacral wound growing MRSA, started on vancomycin via PICC.    Did not improve and so was sent to the hospital.     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  BCx remains no growth.  MRI pelvis confirms changes consistent with osteomyelitis.    Clinically appears better - Continue antibiotics - Consult ID for options for duration and agents Given his poor functional status, progressive neurological disease, nursing reports of stool soiling the wound, malnutrition and 10 lbs weight loss since placement of PEG 6 weeks ago, long term antibiotics for an osteomyelitis of the pelvis are of uncertain benefit.   - WOC for daily wound dressing - Air loss mattress    Hypomagnesemia - Supplement Mag  Normocytic anemia Baseline Hgb 12, here down to 8s.  No observed bleeding. Suspect this is due to underlying infection. Hgb stable, no observed bleeding -  Trend Hgb  Malnutrition of moderate degree Patient had a PEG placed for nutrition 6 weeks ago.  Since then, by our measurements, he has lost another 10 pounds.  Family are trying to get collateral information.  Tube feed rate is at goal.   - Continue Osmolite, Prosource - Consult nutrition  Hypokalemia - Supplement K  Hypernatremia Resolved  History of pulmonary embolus (PE) Recently transition from apixaban to Lovenox, unclear reason - Continue home Lovenox  Depression - Continue sertraline  Hypertension BP  slightly high - Continue propranolol  Multiple sclerosis (HCC) Fingolimod unavailable in hospital. - Resume Gilenya at discharge          Subjective: Patient is nearly nonverbal.  He asks for water.  He shakes his head no to pain or discomfort.  Nursing have no concerns.     Physical Exam: BP (!) 156/91 (BP Location: Right Arm)   Pulse 93   Temp 99 F (37.2 C) (Oral)   Resp 17   Wt 75 kg   SpO2 99%   BMI 23.72 kg/m   Frail adult male, lying in bed, weak and debilitated, tried to watch television, makes eye contact Slightly tachycardic, no murmurs, no peripheral edema Respiratory rate normal, lungs diminished and inspiratory effort weak due to neuromuscular disease, but no rales or wheezes appreciated Abdomen soft without tenderness palpation or guarding, no ascites or distention Tremor noted with movement, ataxia and severe generalized weakness with contractures noted, consistent with chronic multiple sclerosis.  He is severely dysarthric, but can speak short phrases.       Data Reviewed: Discussed with infectious disease Basic metabolic panel shows hypokalemia and hypomagnesemia CBC shows stable anemia   Family Communication: Brother by phone   Disposition: Status is: Inpatient She was admitted with sepsis from osteomyelitis of the sacrum.  This is likely a terminal disease given his poor functional status.  Will discuss options for treatment with infectious disease         Author: Alberteen Sam, MD 04/11/2023 4:34 PM  For on call review [email or web address].

## 2023-04-11 NOTE — Assessment & Plan Note (Signed)
-   Supplement Mag 

## 2023-04-11 NOTE — Assessment & Plan Note (Signed)
Resolved with supplementation and starting spironolactone. 

## 2023-04-11 NOTE — Consult Note (Signed)
[facility]       Reason for Consult: osteomyelitis    Referring Physician: Dr. Maryfrances Bunnell  Principal Problem:   SIRS (systemic inflammatory response syndrome) (HCC) Active Problems:   Multiple sclerosis (HCC)   Hypertension   Depression   History of pulmonary embolus (PE)   Hypernatremia   Hypokalemia   Malnutrition of moderate degree   Normocytic anemia   Sacral decubitus ulcer, stage IV (HCC)   Hypomagnesemia    Chlorhexidine Gluconate Cloth  6 each Topical Q0600   enoxaparin  80 mg Subcutaneous Q12H   feeding supplement (PROSource TF20)  60 mL Per Tube Daily   free water  300 mL Per Tube Q4H   gabapentin  100 mg Per Tube Daily   hydrocerin  1 Application Topical BID   insulin aspart  0-15 Units Subcutaneous Q4H   insulin aspart  3 Units Subcutaneous Q4H   insulin glargine-yfgn  8 Units Subcutaneous Daily   [START ON 04/15/2023] leptospermum manuka honey  1 Application Topical Daily   lidocaine  1 patch Transdermal Daily   mouth rinse  15 mL Mouth Rinse 4 times per day   propranolol  20 mg Per Tube BID   sertraline  100 mg Per Tube Daily   [START ON 04/12/2023] sertraline  100 mg Per Tube Daily   sodium chloride flush  10-40 mL Intracatheter Q12H   sodium chloride flush  3 mL Intravenous Q12H   sodium hypochlorite   Topical 1 day or 1 dose   thiamine  100 mg Per Tube Daily    Recommendations: Continue with supportive care and discussion on patients care   Assessment: He has a sacral decubitus ulcer deep to bone and MRI findings concerning for osteomyelitis with bone marrow edema of the coccygeal segments and possible early osteomyelitis of the sacrum.  He has a urine culture with Pseudomonas.   Treatment of osteomyelitis of the sacral/disc area would require a significant effort in addition to antibiotics - first he will need aggressive nutrition to improve his protein level which is currently very low (albumin 1.7), he will need  offloading of the area for significant portions of the day and will need to avoid soiling with stool and urine to the area with interventions such as a condom catheter or suprapubic catheter and consideration of a diverting colostomy if stool contaminates it.  Then antibiotics can offer some benefit for treatment with the goal to cure the osteomyelitis.  Short of these interventions, it will not heal the bone infection.   If treatment is considered, I agree that he would need broad coverage and would ideally include coverage for Pseudomonas which would require a picc line for cefepime (intermediate to cipro, so no oral options) in addition to something like daptomycin.   HPI: [name] is a 42 y.o. male with multiple sclerosis, bedbound, non-verbal with PEG tube in place admitted 12/9 with lethargy.  He lives in a facility and had been on broad coverage for his sacral wound prior to admission and here with concern for sepsis without a source.  He has been on vancomycin, cefepime and metronidazole empirically with no positive blood cultures.  I was asked to see regarding his sacral decubitus ulcer and MRI with osteomyelitis.  He does not answer questions.  No family at bedside.   Review of Systems:  Unable to be assessed due to mental status  Past Medical History:  Diagnosis Date   Acute respiratory failure  with hypoxia (HCC) 02/01/2023   Cognitive communication deficit    Depression    Diabetes mellitus without complication (HCC)    Dysarthria and anarthria    Eye abnormalities    unspecified by pt   Hypertension    Multiple sclerosis (HCC)    Paraplegia (HCC)    Severe sepsis due to aspiration pneumonia (HCC) 01/31/2023   Weakness    generalized    Social History   Tobacco Use   Smoking status: Every Day    Current packs/day: 1.00    Types: Cigarettes   Smokeless tobacco: Never  Vaping Use   Vaping status: Never Used  Substance Use Topics   Alcohol use: Yes    Alcohol/week:  12.0 standard drinks of alcohol    Types: 12 Cans of beer per week   Drug use: Yes    Frequency: 30.0 times per week    Types: Marijuana    Comment: Daily    Family History  Adopted: Yes  Problem Relation Age of Onset   Multiple sclerosis Cousin    Heart disease Mother    Other Father        Does not know his father.    No Known Allergies  Physical Exam: Constitutional: alert Vitals:   04/11/23 0744 04/11/23 1500  BP: 135/87 (!) 156/91  Pulse: 94 93  Resp: 18 17  Temp: 98.8 F (37.1 C) 99 F (37.2 C)  SpO2: 100% 99%   EYES: anicteric ENMT: no thrush Respiratory: normal respiratory effort Musculoskeletal: thin, no edema  Lab Results  Component Value Date   WBC 5.6 04/11/2023   HGB 8.8 (L) 04/11/2023   HCT 28.2 (L) 04/11/2023   MCV 90.4 04/11/2023   PLT 311 04/11/2023    Lab Results  Component Value Date   CREATININE 0.49 (L) 04/11/2023   BUN 15 04/11/2023   NA 139 04/11/2023   K 3.2 (L) 04/11/2023   CL 107 04/11/2023   CO2 27 04/11/2023    Lab Results  Component Value Date   ALT 32 04/11/2023   AST 21 04/11/2023   ALKPHOS 59 04/11/2023     Microbiology: Recent Results (from the past 240 hour(s))  Culture, blood (routine x 2)     Status: None (Preliminary result)   Collection Time: 04/09/23 12:48 PM   Specimen: BLOOD LEFT FOREARM  Result Value Ref Range Status   Specimen Description BLOOD LEFT FOREARM  Final   Special Requests   Final    BOTTLES DRAWN AEROBIC AND ANAEROBIC Blood Culture adequate volume   Culture   Final    NO GROWTH 2 DAYS Performed at [facility], [address], [place], [place] [address]    Report Status PENDING  Incomplete  Culture, blood (routine x 2)     Status: None (Preliminary result)   Collection Time: 04/09/23  1:00 PM   Specimen: BLOOD RIGHT HAND  Result Value Ref Range Status   Specimen Description BLOOD RIGHT HAND  Final   Special Requests   Final    BOTTLES DRAWN AEROBIC AND ANAEROBIC Blood Culture  adequate volume   Culture   Final    NO GROWTH 2 DAYS Performed at [facility], [address], [place], [place] [address]    Report Status PENDING  Incomplete  Resp panel by RT-PCR (RSV, Flu A&B, Covid) Anterior Nasal Swab     Status: None   Collection Time: 04/09/23  1:00 PM   Specimen: Anterior Nasal Swab  Result Value Ref Range Status   SARS Coronavirus 2 by RT PCR NEGATIVE NEGATIVE Final   Influenza A by PCR NEGATIVE NEGATIVE Final   Influenza B by PCR NEGATIVE NEGATIVE Final    Comment: (NOTE) The Xpert Xpress SARS-CoV-2/FLU/RSV plus assay is intended as an aid in the diagnosis of influenza from Nasopharyngeal swab specimens and should not be used as a sole basis for treatment. Nasal washings and aspirates are unacceptable for Xpert Xpress SARS-CoV-2/FLU/RSV testing.  Fact Sheet for Patients: BloggerCourse.com  Fact Sheet for Healthcare Providers: SeriousBroker.it  This test is not yet approved or cleared by the [place] FDA and has been authorized for detection and/or diagnosis of SARS-CoV-2 by FDA under an Emergency Use Authorization (EUA). This EUA will remain in effect (meaning this test can be used) for the duration of the COVID-19 declaration under Section 564(b)(1) of the Act, 21 U.S.C. section 360bbb-3(b)(1), unless the authorization is terminated or revoked.     Resp Syncytial Virus by PCR NEGATIVE NEGATIVE Final    Comment: (NOTE) Fact Sheet for Patients: BloggerCourse.com  Fact Sheet for Healthcare Providers: SeriousBroker.it  This test is not yet approved or cleared by the [place] FDA and has been authorized for detection and/or diagnosis of SARS-CoV-2 by FDA under an Emergency Use Authorization (EUA). This EUA will remain in effect (meaning this test can be used) for the duration of the COVID-19 declaration under Section 564(b)(1) of the  Act, 21 U.S.C. section 360bbb-3(b)(1), unless the authorization is terminated or revoked.  Performed at [facility], [address] [place], [place] [address]   Urine Culture     Status: Abnormal   Collection Time: 04/09/23  2:57 PM   Specimen: Urine, Catheterized  Result Value Ref Range Status   Specimen Description URINE, CATHETERIZED  Final   Special Requests   Final    NONE Performed at [facility], [address], [place], [place] [address]    Culture 80,000 COLONIES/mL PSEUDOMONAS AERUGINOSA (A)  Final   Report Status 04/11/2023 FINAL  Final   Organism ID, Bacteria PSEUDOMONAS AERUGINOSA (A)  Final      Susceptibility   Pseudomonas aeruginosa - MIC*    CEFTAZIDIME 4 SENSITIVE Sensitive     CIPROFLOXACIN 1 INTERMEDIATE Intermediate     GENTAMICIN <=1 SENSITIVE Sensitive     IMIPENEM <=0.25 SENSITIVE Sensitive     PIP/TAZO 8 SENSITIVE Sensitive ug/mL    CEFEPIME 4 SENSITIVE Sensitive     * 80,000 COLONIES/mL PSEUDOMONAS AERUGINOSA  MRSA Next Gen by PCR, Nasal     Status: None   Collection Time: 04/10/23  6:43 PM   Specimen: Nasal Mucosa; Nasal Swab  Result Value Ref Range Status   MRSA by PCR Next Gen NOT DETECTED NOT DETECTED Final    Comment: (NOTE) The GeneXpert MRSA Assay (FDA approved for NASAL specimens only), is one component of a comprehensive MRSA colonization surveillance program. It is not intended to diagnose MRSA infection nor to guide or monitor treatment for MRSA infections. Test performance is not FDA approved in patients less than 38 years old. Performed at [facility], [address], [place], [place] [address]     Gardiner Barefoot, MD [facility] [email or web address] 04/11/2023, 4:08 PM

## 2023-04-11 NOTE — Plan of Care (Signed)
  Problem: Coping: Goal: Ability to adjust to condition or change in health will improve Outcome: Progressing   Problem: Fluid Volume: Goal: Ability to maintain a balanced intake and output will improve Outcome: Progressing   Problem: Metabolic: Goal: Ability to maintain appropriate glucose levels will improve Outcome: Progressing   

## 2023-04-12 DIAGNOSIS — R6510 Systemic inflammatory response syndrome (SIRS) of non-infectious origin without acute organ dysfunction: Secondary | ICD-10-CM | POA: Diagnosis not present

## 2023-04-12 DIAGNOSIS — Z86711 Personal history of pulmonary embolism: Secondary | ICD-10-CM | POA: Diagnosis not present

## 2023-04-12 DIAGNOSIS — L89154 Pressure ulcer of sacral region, stage 4: Secondary | ICD-10-CM | POA: Diagnosis not present

## 2023-04-12 DIAGNOSIS — F32A Depression, unspecified: Secondary | ICD-10-CM | POA: Diagnosis not present

## 2023-04-12 LAB — GLUCOSE, CAPILLARY
Glucose-Capillary: 187 mg/dL — ABNORMAL HIGH (ref 70–99)
Glucose-Capillary: 193 mg/dL — ABNORMAL HIGH (ref 70–99)
Glucose-Capillary: 204 mg/dL — ABNORMAL HIGH (ref 70–99)
Glucose-Capillary: 208 mg/dL — ABNORMAL HIGH (ref 70–99)
Glucose-Capillary: 230 mg/dL — ABNORMAL HIGH (ref 70–99)
Glucose-Capillary: 231 mg/dL — ABNORMAL HIGH (ref 70–99)

## 2023-04-12 LAB — POTASSIUM: Potassium: 3.6 mmol/L (ref 3.5–5.1)

## 2023-04-12 LAB — CBC
HCT: 29.6 % — ABNORMAL LOW (ref 39.0–52.0)
Hemoglobin: 9.3 g/dL — ABNORMAL LOW (ref 13.0–17.0)
MCH: 27.8 pg (ref 26.0–34.0)
MCHC: 31.4 g/dL (ref 30.0–36.0)
MCV: 88.6 fL (ref 80.0–100.0)
Platelets: 346 10*3/uL (ref 150–400)
RBC: 3.34 MIL/uL — ABNORMAL LOW (ref 4.22–5.81)
RDW: 14.8 % (ref 11.5–15.5)
WBC: 8.6 10*3/uL (ref 4.0–10.5)
nRBC: 0 % (ref 0.0–0.2)

## 2023-04-12 LAB — MAGNESIUM: Magnesium: 1.8 mg/dL (ref 1.7–2.4)

## 2023-04-12 NOTE — Progress Notes (Signed)
  Progress Note   Patient: [name] [id number]:[id number] DOB: [birth year] DOA: 04/09/2023     2 DOS: the patient was seen and examined on 04/12/2023 at 11:25AM      Brief hospital course: 42 y.o. M with relapsing remitting MS on fingolimod, wheelchair/bedbound and nonverbal at baseline, lives in LTC, and HTN who presented with fever.      Assessment and Plan: * SIRS Chronic osteomyelitis Sepsis ruled out.    Discussed with ID Dr. Luciana Axe.  At the moment, it does not appear clinically that the wound has gross infection.  Likely the osteo is chronic and smoldering.  He recommends against prolonged antibitoics at this time.  I discussed option with family for (A) supportive care only and Hospice versus (B) attempt to improve nutrition to the point that the wound is healing: - Optimize nutrition - Off load wound - If albumin increases - If weight increases - At that point could consider referral to Gen Surg for diverting colostomy and to ID for prolonged IV treatment - WOC for daily wound dressing - Air loss mattress             Subjective: Nonverbal.  Shakes head no to pain. No nursing concerns>  Watching TV     Physical Exam: BP (!) 144/91 (BP Location: Right Arm)   Pulse 94   Temp 99.6 F (37.6 C) (Oral)   Resp 17   Wt 90.3 kg   SpO2 100%   BMI 28.55 kg/m   Thin adult male, lying in bed, watching TV Tremor with movement.  Very dysarthric.  Answers some questions with head nod/shake, but others does not answer appropriately.  Tries to articulate something about his PEG, but this doesn't make sense RRR no murmurs, no edema Respiratory rate normal, lungs diminished, too neurospastic to breathe to command   Data Reviewed:   Family Communication: Brother by phone    Disposition: Status is: Inpatient Likely home tomorrow to SNF        Author: Alberteen Sam, MD 04/12/2023 5:14 PM  For on call review [email or web address].

## 2023-04-13 DIAGNOSIS — L89154 Pressure ulcer of sacral region, stage 4: Secondary | ICD-10-CM | POA: Diagnosis not present

## 2023-04-13 LAB — GLUCOSE, CAPILLARY
Glucose-Capillary: 156 mg/dL — ABNORMAL HIGH (ref 70–99)
Glucose-Capillary: 170 mg/dL — ABNORMAL HIGH (ref 70–99)
Glucose-Capillary: 175 mg/dL — ABNORMAL HIGH (ref 70–99)
Glucose-Capillary: 193 mg/dL — ABNORMAL HIGH (ref 70–99)

## 2023-04-13 MED ORDER — JUVEN PO PACK: 1.0000 | PACK | Freq: Two times a day (BID) | ORAL | Status: DC

## 2023-04-13 MED ORDER — PROSOURCE TF20 ENFIT COMPATIBL EN LIQD
60.0000 mL | Freq: Two times a day (BID) | ENTERAL | Status: DC
Start: 2023-04-13 — End: 2023-04-13

## 2023-04-13 MED ORDER — JUVEN PO PACK
1.0000 | PACK | Freq: Two times a day (BID) | ORAL | Status: DC
  Filled 2023-04-13: qty 1

## 2023-04-13 MED ORDER — FREE WATER: 200.0000 mL | Status: AC

## 2023-04-13 NOTE — Progress Notes (Addendum)
Nutrition Follow-up  DOCUMENTATION CODES:   Not applicable  INTERVENTION:  TF via PEG: Osmolite 1.5 at 79ml/h ( per day) Increase ProSource TF20 60ml from daily to BID Free water flushes q4h ( per day)- increased 12/10 per MD   Provides 2320 kcal, 130g protein, free water daily (FWF +TF)  With resolution of hypernatremia, recommend decreasing FWF to q4h (provides per day)  1 packet Juven BID, each packet provides 95 calories, 2.5 grams of protein (collagen), and 9.8 grams of carbohydrate (3 grams sugar); also contains 7 grams of L-arginine and L-glutamine, 300 mg vitamin C, 15 mg vitamin E, 1.2 mcg vitamin B-12, 9.5 mg zinc, 200 mg calcium, and 1.5 g  Calcium Beta-hydroxy-Beta-methylbutyrate to support wound healing   On discharge, recommend resuming home TF regimen: Glucerna 1.5 at 32ml/hr (1560 ml per day) + free water flushes q4h ( per day) Provides 2340 kcal, 129g protein, total free water daily (TF + FWF)  Add and continue Juven BID daily to support wound healing  NUTRITION DIAGNOSIS:   Increased nutrient needs related to acute illness as evidenced by estimated needs. - remains applicable  GOAL:   Patient will meet greater than or equal to 90% of their needs - goal met via TF  MONITOR:   TF tolerance  REASON FOR ASSESSMENT:   Consult Enteral/tube feeding initiation and management  ASSESSMENT:   42 y.o. M, Presented from long term care facility, increased lethargy, and concerns for sepsis. PMH; MS, HTN, Depression, DM, Severe sepsis, acute respiratory failure, PEG.  Received secure chat from MD regarding additional nutrition supplements to support wound healing. Planning for discharge following recommendations.   Reviewed weights throughout admission. Question accuracy of measurements as documentation reflects pt is +15.3 kg between 12/11 and 12/12. Will continue to monitor.   Reviewed estimated needs.  Increased protein needs slightly to account for ongoing wounds and osteomyelitis. If remains on Osmolite, would recommend increasing ProSource administration to BID to better meet increased protein needs.   Noted in home medication list and per MD, pt has been on glucerna 1.5 at 38ml/hr to aid in glucose control. Would recommend continuing this regimen on discharge in addition to adding juven to support wound healing.   Medications: SSI 0-15 units q4h, SSI 3 units q4h, semglee 8 units daily, thiamine, IV abx  Labs (12/11): sodium 139 (wdl), Cr 0.49, CBG's 156-208 x24 hours  Diet Order:   Diet Order             Diet NPO time specified  Diet effective now                   EDUCATION NEEDS:   Not appropriate for education at this time  Skin:  Skin Assessment: Skin Integrity Issues: Skin Integrity Issues:: Stage IV, DTI DTI: DTI pressure, R heel Stage IV: Sacrum  Last BM:  12/11 (type6  x2)  Height:   Ht Readings from Last 1 Encounters:  02/02/23 5\' 10"  (1.778 m)    Weight:   Wt Readings from Last 1 Encounters:  04/13/23 89.5 kg   BMI:  Body mass index is 28.31 kg/m.  Estimated Nutritional Needs:   Kcal:  2200-2575 kcal  Protein:  115-130g  Fluid:  34ml/kcal  Drusilla Kanner, RDN, LDN Clinical Nutrition

## 2023-04-13 NOTE — TOC Transition Note (Signed)
Transition of Care [facility]) - Discharge Note   Patient Details  Name: [name] MRN: [id number] Date of Birth: 1980/09/03  Transition of [facility]) CM/SW Contact:  Khayree Delellis A [place], LCSWA Phone Number: 04/13/2023, 1:19 PM   Clinical Narrative:     Patient will DC to: [facility]  Anticipated DC date: 04/13/23  Family notified: Lytle Butte  Transport by: Sharin Mons      Per MD patient ready for DC to [facility]. RN, patient, patient's family, and facility notified of DC. Discharge Summary and FL2 sent to facility. RN to call report prior to discharge [phone]). DC packet on chart. Ambulance transport requested for patient.     CSW will sign off for now as social work intervention is no longer needed. Please consult [place] again if new needs arise.   Final next level of care: Skilled Nursing Facility Barriers to Discharge: Barriers Resolved   Patient Goals and CMS Choice            Discharge Placement              Patient chooses bed at: [facility] Patient to be transferred to facility by: [facility] Name of family member notified: Jiovanni Silmon Patient and family notified of of transfer: 04/13/23  Discharge Plan and Services Additional resources added to the After Visit Summary for                                       Social Drivers of Health (SDOH) Interventions SDOH Screenings   Food Insecurity: No Food Insecurity (04/09/2023)  Housing: Low Risk  (04/09/2023)  Transportation Needs: No Transportation Needs (04/09/2023)  Utilities: Not At Risk (04/09/2023)  Tobacco Use: Medium Risk (03/01/2023)   Received from [facility]     Readmission Risk Interventions     No data to display

## 2023-04-13 NOTE — Discharge Summary (Signed)
Physician Discharge Summary   Patient: [name] MRN: [id number] DOB: May 29, 1980  Admit date:     04/09/2023  Discharge date: 04/13/23  Discharge Physician: Alberteen Sam   PCP: Lonie Peak, PA-C     Recommendations at discharge:  Stop antibiotics Please consult Palliative Care to follow  Continue daily wound care to sacral decubitus ulcer Please ensure STRICT offloading of sacral wound via reattempting air loss mattress or other appropriate offloading per facility experience Please continue Osmolite 1.5 at 65 cc/hr, ProStat 60 mL BID, and ADD Juven supplement twice daily Please check weight weekly and albumin/prealbumin every 2 weeks If patient has improvement in wound appearance, improving weight and improving albumin over coming weeks, it would be reasonable to refer to General Surgery for diverting colostomy consideration and Infectious Disease for prolonged IV antibiotics If patient loses weight, or has persistently low albumin, would recommend Hospice Treat fever with oral or IV antibiotics at discretion of facility Repeat CBC in 1 week then trend Please ensure patient has expected follow up with Dr. Malvin Johns Neurology       Discharge Diagnoses: Principal Problem:   Fever Active Problems:   Osteomyelitis of the coccyx and sacrum   Sacral decubitus ulcer, stage IV (HCC)   Sepsis ruled out   Multiple sclerosis (HCC)   Hypertension   Depression   History of pulmonary embolus (PE)   Hypernatremia   Hypokalemia   Malnutrition of moderate degree   Normocytic anemia   Hypomagnesemia      Hospital Course: 42 y.o. M with relapsing remitting MS on fingolimod, wheelchair/bedbound and nonverbal at baseline, lives in LTC, and HTN who presented with fever.  Had developed fever at facility few days prior to admission, surface swab of sacral wound growing MRSA, started on vancomycin and Zosyn via PICC.    Subsequently sent to the hospital.      *  SIRS Chronic osteomyelitis of the  P/w fever, tachcyardia.  Blood cultures negative.  Urine culture only 80K PsA.  MRI pelvis showed changes consistent with osteomyelitis.  Wound had no drainage or surrounding cellulitis.  ID were consulted for antibiotic recommendations for osteomyelitis.  They recommended against further antibiotics.    At present, the patient is underweight, has albumin 1.7 and is not a candidate for diverting colostomy.  In light of that fact, his inability to effectively offload the wound because of weakness and progressive neurological disease, and his continued soiling of the wound, ID recommended and I agree that prolonged broad spectrum IV therapy for chronic osteomyelitis was more likely to cause harm (development of resistance, cytopenias, ARF) than benefit.  They recommend no therapy at present, maximization of offloading and nutrition and monitoring of the wound.  If nutrition and wound appearance unexpectedly improve, outpatient referral to General Surgery and ID should be sought.  In the meantime, they recommend Palliative care and ongoing discussions of goals of care.  Reactive treatment of fever at discretion of facility is reasonable.    All of the above was relayed to the patient's brother/POA, who expressed understanding.      Normocytic anemia Baseline Hgb 12, here down to 8s.  No observed bleeding. Suspect this is due to underlying infection. Hgb stable, no observed bleeding  Malnutrition of moderate degree Patient had a PEG placed for nutrition 6 weeks ago.  Since then, nursing facility report that his weight is improving. - Recommend adding Juven - Continue Osmolite 1.5 at 65, prostat 60 bid - Trend albumin and  prealbumin  History of pulmonary embolus (PE) Recently transition from apixaban to Lovenox - Continue home Lovenox for total 3 months  Multiple sclerosis (HCC) Fingolimod continued            [facility] Controlled  Substances Registry was reviewed for this patient prior to discharge.  Consultants: Infectious Disease Dr. Luciana Axe Procedures performed: MRI pelvis   Disposition: Long term care facility Diet recommendation: Tube feed   DISCHARGE MEDICATION: Allergies as of 04/13/2023   No Known Allergies      Medication List     STOP taking these medications    linezolid 600 MG/300ML IVPB Commonly known as: ZYVOX   Piperacillin Sod-Tazobactam So 3-0.375 g Solr   PRESCRIPTION MEDICATION   sodium chloride irrigation 0.9 % irrigation   vancomycin 1250 MG/250ML Soln Commonly known as: VANCOREADY       TAKE these medications    acetaminophen 325 MG tablet Commonly known as: TYLENOL Place 650 mg into feeding tube in the morning and at bedtime. PRN order: take 650 mg per tube every 8 hours as needed for pain/fever   Basaglar KwikPen 100 UNIT/ML Inject 10 Units into the skin at bedtime.   calcium carbonate 500 MG chewable tablet Commonly known as: TUMS - dosed in mg elemental calcium Place 1,000 mg into feeding tube every 8 (eight) hours as needed for indigestion or heartburn.   collagenase 250 UNIT/GM ointment Commonly known as: SANTYL Apply 1 Application topically daily. Apply to sacral wound   Dulcolax 10 MG suppository Generic drug: bisacodyl Place 10 mg rectally daily as needed (for constipation).   enoxaparin 60 MG/0.6ML injection Commonly known as: LOVENOX Inject 60 mg into the skin every 12 (twelve) hours.   EUCERIN INTENSIVE REPAIR EX Apply 1 application  topically See admin instructions. Apply to face and body in the morning and at bedtime   feeding supplement (GLUCERNA 1.5 CAL) Liqd Place 65 mL/hr into feeding tube continuous. What changed: Another medication with the same name was added. Make sure you understand how and when to take each.   nutrition supplement (JUVEN) Pack Take 1 packet by mouth 2 (two) times daily between meals. What changed: You were already  taking a medication with the same name, and this prescription was added. Make sure you understand how and when to take each.   free water Soln Place 200 mLs into feeding tube every 4 (four) hours. What changed: how much to take   gabapentin 250 MG/5ML solution Commonly known as: NEURONTIN Place 2 mLs (100 mg total) into feeding tube daily.   Gilenya 0.5 MG Caps Generic drug: Fingolimod HCl Place 0.5 mg into feeding tube daily.   heparin flush 10 UNIT/ML Soln injection 10 Units See admin instructions. Flush with 10 units every  shift   insulin aspart 100 UNIT/ML injection Commonly known as: novoLOG Inject 0-10 Units into the skin in the morning, at noon, in the evening, and at bedtime. BS 60-150 inject 0 units  BS 151-199 inject 2 units  BS 200-249 inject 4 units  BS 250-299 inject 6 units  BS 300-349 inject 8 units  BS 350-399 inject 10 units  BS [phone] contact MD   lidocaine 4 % Place 1 patch onto the skin See admin instructions. Apply 1 patch to the lower back and lower extremities once a day- remove as directed   methocarbamol 500 MG tablet Commonly known as: ROBAXIN Place 500 mg into feeding tube every 6 (six) hours as needed for muscle spasms.  mupirocin ointment 2 % Commonly known as: BACTROBAN Apply 1 Application topically 2 (two) times daily. Apply to PEG tube site   oxyCODONE-acetaminophen 5-325 MG tablet Commonly known as: PERCOCET/ROXICET Place 1 tablet into feeding tube every 12 (twelve) hours. PRN order: take 1 tablet per tube every 6 hours as needed for pain   polyethylene glycol powder 17 GM/SCOOP powder Commonly known as: GLYCOLAX/MIRALAX Place 17 g into feeding tube daily.   potassium chloride 20 MEQ/15ML (10%) Soln Place 7.5 mLs (10 mEq total) into feeding tube daily.   Pro-Stat Liqd Place 60 mLs into feeding tube in the morning and at bedtime.   propranolol 20 MG tablet Commonly known as: INDERAL Place 20 mg into feeding tube 2 (two) times  daily.   sertraline 20 MG/ML concentrated solution Commonly known as: ZOLOFT Place 5 mLs (100 mg total) into feeding tube daily.   thiamine 100 MG tablet Commonly known as: Vitamin B-1 Place 1 tablet (100 mg total) into feeding tube daily.               Discharge Care Instructions  (From admission, onward)           Start     Ordered   04/13/23 0000  Discharge wound care:       Comments: Change sacral wound dressing daily Apply Santyl or Medihoney to wound Cover with wet to dry dressing Change daily or for soiling Cover with ABD pad   04/13/23 0906             Discharge Instructions     Discharge instructions   Complete by: As directed    **IMPORTANT DISCHARGE INSTRUCTIONS**   From Dr. Maryfrances Bunnell: You were admitted for concern for infection.  You were treated with antibiotics and improved.  Our infectious disease specialist evaluated you and recommended stopping further antibiotics at this time.  There is findings on your MRI of your pelvis that there is infection in the bone of the pelvis.  This is called "osteomyelitis" or infection in the bone.  This is a chronic condition, that can only heal if the skin overlying it heals. It typically will not cause active symptoms (of fever, weakness, etc)  For this to heal: - Every effort must be made to offload the wound (avoid pressure on the wound) at all times - Every effort must be made to improve nutrition - Monitor weight weekly - Monitor albumin and prealbumin levels every two weeks  Given that this is more than likely NOT to heal, we recommend Palliative Care to follow at [facility]  If, over the next 4-6 weeks, the albumin level does improve, and the wound does look better, consultation with Infectious Disease could be sought in the office to start prolonged antibiotics.  Also, an appointment with a surgeon to do a diverting colostomy could be sought (to prevent soiling of the wound)   Discharge  wound care:   Complete by: As directed    Change sacral wound dressing daily Apply Santyl or Medihoney to wound Cover with wet to dry dressing Change daily or for soiling Cover with ABD pad   Increase activity slowly   Complete by: As directed        Discharge Exam: Filed Weights   04/11/23 0410 04/12/23 0500 04/13/23 0622  Weight: 75 kg 90.3 kg 89.5 kg    General: Pt is alert, awake, not in acute distress, watching TV Cardiovascular: Tachycardic, regular, at baseline, nl S1-S2, no murmurs appreciated.  No LE edema.   Respiratory: Normal respiratory rate and rhythm.  CTAB without rales or wheezes. Abdominal: Abdomen soft and non-tender.  No distension or HSM.   Neuro/Psych: Contractured in all four extremities, tremor noted.  Severely dysarthric.  Limited verbal repertoire.   Condition at discharge: poor  The results of significant diagnostics from this hospitalization (including imaging, microbiology, ancillary and laboratory) are listed below for reference.   Imaging Studies: MR PELVIS W WO CONTRAST Result Date: 04/11/2023 CLINICAL DATA:  Sacral decubitus ulcer EXAM: MRI PELVIS WITHOUT AND WITH CONTRAST TECHNIQUE: Multiplanar multisequence MR imaging of the pelvis was performed both before and after administration of intravenous contrast. CONTRAST:  7mL GADAVIST GADOBUTROL 1 MMOL/ML IV SOLN COMPARISON:  CT 01/31/2023 FINDINGS: Bones/Joint/Cartilage Bone marrow edema and confluent low T1 signal throughout the coccygeal segments. There is mild bone marrow edema within the distal most sacrum. No fracture or diastasis. No SI joint effusion. No hip joint dislocation. Diffuse marrow heterogeneity without discrete marrow replacing lesion. Findings are nonspecific but can be seen in the setting of chronic anemia, smoking, and/or obesity. Ligaments No acute findings. Muscles and Tendons Diffusely increased T2 signal of the pelvic and imaged proximal thigh musculature which may be related  to denervation and/or myositis. No organized intramuscular fluid collection. No acute tendinous abnormality. Soft tissues Sacral decubitus ulcer with ulcer base extending to the underlying bone at the sacrococcygeal junction. Diffuse soft tissue edema. No organized or drainable fluid collections. Nonspecific presacral edema. Urinary bladder wall is circumferentially thickened. A Foley catheter is in place. IMPRESSION: 1. Sacral decubitus ulcer with acute osteomyelitis of the coccygeal segments. 2. Mild bone marrow edema within the distal most sacrum, concerning for early acute osteomyelitis. 3. Diffuse soft tissue edema. No organized or drainable fluid collections. 4. Diffusely increased T2 signal of the pelvic and imaged proximal thigh musculature which may be related to denervation and/or myositis. 5. Urinary bladder wall is circumferentially thickened. Correlate with urinalysis to exclude cystitis. Electronically Signed   By: Duanne Guess D.O.   On: 04/11/2023 15:35   DG Pelvis Portable Result Date: 04/10/2023 CLINICAL DATA:  Stage IV sacral decubitus ulcer EXAM: PORTABLE PELVIS 1-2 VIEWS COMPARISON:  02/02/2023 FINDINGS: Supine frontal view of the pelvis includes both hips. No acute displaced fracture. No evidence of bony destruction to suggest osteomyelitis on this single frontal view. Mild symmetrical bilateral hip osteoarthritis. Sacroiliac joints are normal. Diffuse subcutaneous edema. IMPRESSION: 1. No acute or destructive bony abnormalities. No radiographic evidence of osteomyelitis. Electronically Signed   By: Sharlet Salina M.D.   On: 04/10/2023 19:58   DG Chest Portable 1 View Result Date: 04/09/2023 CLINICAL DATA:  Chest pain.  Suspected sepsis. EXAM: PORTABLE CHEST 1 VIEW COMPARISON:  01/31/2023 FINDINGS: The heart size and mediastinal contours are within normal limits. Left arm PICC line is seen with tip overlying the SVC. Both lungs are clear. IMPRESSION: No active disease. Electronically  Signed   By: Danae Orleans M.D.   On: 04/09/2023 14:42    Microbiology: Results for orders placed or performed during the hospital encounter of 04/09/23  Culture, blood (routine x 2)     Status: None (Preliminary result)   Collection Time: 04/09/23 12:48 PM   Specimen: BLOOD LEFT FOREARM  Result Value Ref Range Status   Specimen Description BLOOD LEFT FOREARM  Final   Special Requests   Final    BOTTLES DRAWN AEROBIC AND ANAEROBIC Blood Culture adequate volume   Culture   Final    NO  GROWTH 3 DAYS Performed at [facility], [address] [place], [place] [address]    Report Status PENDING  Incomplete  Culture, blood (routine x 2)     Status: None (Preliminary result)   Collection Time: 04/09/23  1:00 PM   Specimen: BLOOD RIGHT HAND  Result Value Ref Range Status   Specimen Description BLOOD RIGHT HAND  Final   Special Requests   Final    BOTTLES DRAWN AEROBIC AND ANAEROBIC Blood Culture adequate volume   Culture   Final    NO GROWTH 3 DAYS Performed at [facility], [address], [place], [place] [address]    Report Status PENDING  Incomplete  Resp panel by RT-PCR (RSV, Flu A&B, Covid) Anterior Nasal Swab     Status: None   Collection Time: 04/09/23  1:00 PM   Specimen: Anterior Nasal Swab  Result Value Ref Range Status   SARS Coronavirus 2 by RT PCR NEGATIVE NEGATIVE Final   Influenza A by PCR NEGATIVE NEGATIVE Final   Influenza B by PCR NEGATIVE NEGATIVE Final    Comment: (NOTE) The Xpert Xpress SARS-CoV-2/FLU/RSV plus assay is intended as an aid in the diagnosis of influenza from Nasopharyngeal swab specimens and should not be used as a sole basis for treatment. Nasal washings and aspirates are unacceptable for Xpert Xpress SARS-CoV-2/FLU/RSV testing.  Fact Sheet for Patients: BloggerCourse.com  Fact Sheet for Healthcare Providers: SeriousBroker.it  This test is not yet approved or cleared by the  [place] FDA and has been authorized for detection and/or diagnosis of SARS-CoV-2 by FDA under an Emergency Use Authorization (EUA). This EUA will remain in effect (meaning this test can be used) for the duration of the COVID-19 declaration under Section 564(b)(1) of the Act, 21 U.S.C. section 360bbb-3(b)(1), unless the authorization is terminated or revoked.     Resp Syncytial Virus by PCR NEGATIVE NEGATIVE Final    Comment: (NOTE) Fact Sheet for Patients: BloggerCourse.com  Fact Sheet for Healthcare Providers: SeriousBroker.it  This test is not yet approved or cleared by the [place] FDA and has been authorized for detection and/or diagnosis of SARS-CoV-2 by FDA under an Emergency Use Authorization (EUA). This EUA will remain in effect (meaning this test can be used) for the duration of the COVID-19 declaration under Section 564(b)(1) of the Act, 21 U.S.C. section 360bbb-3(b)(1), unless the authorization is terminated or revoked.  Performed at [facility], [address], [place], [place] [address]   Urine Culture     Status: Abnormal   Collection Time: 04/09/23  2:57 PM   Specimen: Urine, Catheterized  Result Value Ref Range Status   Specimen Description URINE, CATHETERIZED  Final   Special Requests   Final    NONE Performed at [facility], [address] [place], [place] [address]    Culture 80,000 COLONIES/mL PSEUDOMONAS AERUGINOSA (A)  Final   Report Status 04/11/2023 FINAL  Final   Organism ID, Bacteria PSEUDOMONAS AERUGINOSA (A)  Final      Susceptibility   Pseudomonas aeruginosa - MIC*    CEFTAZIDIME 4 SENSITIVE Sensitive     CIPROFLOXACIN 1 INTERMEDIATE Intermediate     GENTAMICIN <=1 SENSITIVE Sensitive     IMIPENEM <=0.25 SENSITIVE Sensitive     PIP/TAZO 8 SENSITIVE Sensitive ug/mL    CEFEPIME 4 SENSITIVE Sensitive     * 80,000 COLONIES/mL PSEUDOMONAS AERUGINOSA  MRSA Next Gen by  PCR, Nasal     Status: None  Collection Time: 04/10/23  6:43 PM   Specimen: Nasal Mucosa; Nasal Swab  Result Value Ref Range Status   MRSA by PCR Next Gen NOT DETECTED NOT DETECTED Final    Comment: (NOTE) The GeneXpert MRSA Assay (FDA approved for NASAL specimens only), is one component of a comprehensive MRSA colonization surveillance program. It is not intended to diagnose MRSA infection nor to guide or monitor treatment for MRSA infections. Test performance is not FDA approved in patients less than 2 years old. Performed at [facility], [address], [place], [place] [address]     Labs: CBC: Recent Labs  Lab 04/08/23 2155 04/09/23 1248 04/09/23 1309 04/10/23 0536 04/11/23 0500 04/12/23 0301  WBC 6.4 6.9  --  4.9 5.6 8.6  NEUTROABS  --  5.5  --   --   --   --   HGB 10.0* 9.9* 10.5* 8.8* 8.8* 9.3*  HCT 32.2* 32.4* 31.0* 28.5* 28.2* 29.6*  MCV 91.0 90.8  --  91.1 90.4 88.6  PLT 362 407*  --  322 311 346   Basic Metabolic Panel: Recent Labs  Lab 04/08/23 2155 04/09/23 1248 04/09/23 1309 04/10/23 0536 04/11/23 0500 04/12/23 0301  NA 147* 151* 150* 149* 139  --   K 4.1 4.0 4.1 3.5 3.2* 3.6  CL 107 112* 112* 115* 107  --   CO2 31 27  --  25 27  --   GLUCOSE 282* 412* 440* 228* 228*  --   BUN 42* 31* 29* 19 15  --   CREATININE 0.62 0.95 0.60* 0.43* 0.49*  --   CALCIUM 9.2 8.8*  --  8.5* 8.5*  --   MG  --   --   --   --  1.5* 1.8   Liver Function Tests: Recent Labs  Lab 04/09/23 1248 04/10/23 0536 04/11/23 0500  AST 43* 29 21  ALT 54* 42 32  ALKPHOS 87 65 59  BILITOT 0.5 0.3 0.5  PROT 7.2 5.9* 5.9*  ALBUMIN 2.0* 1.7* 1.7*   CBG: Recent Labs  Lab 04/12/23 1613 04/12/23 2027 04/12/23 2359 04/13/23 0514 04/13/23 0809  GLUCAP 187* 208* 175* 156* 193*    Discharge time spent: approximately 45 minutes spent on discharge counseling, evaluation of patient on day of discharge, and coordination of discharge planning with nursing, social work,  pharmacy and case management  Signed: Alberteen Sam, MD [facility] 04/13/2023

## 2023-04-14 LAB — CULTURE, BLOOD (ROUTINE X 2)
Culture: NO GROWTH
Culture: NO GROWTH
Special Requests: ADEQUATE
Special Requests: ADEQUATE

## 2023-05-13 ENCOUNTER — Encounter (HOSPITAL_COMMUNITY): Payer: Self-pay

## 2023-05-13 ENCOUNTER — Inpatient Hospital Stay (HOSPITAL_COMMUNITY): Payer: Medicare Other

## 2023-05-13 ENCOUNTER — Emergency Department (HOSPITAL_COMMUNITY): Payer: Medicare Other

## 2023-05-13 ENCOUNTER — Other Ambulatory Visit: Payer: Self-pay

## 2023-05-13 ENCOUNTER — Inpatient Hospital Stay (HOSPITAL_COMMUNITY)
Admission: EM | Admit: 2023-05-13 | Discharge: 2023-05-24 | DRG: 004 | Disposition: A | Discharge status: Other Acute Inpatient Hospital | Payer: Medicare Other | Source: Skilled Nursing Facility | Attending: Pulmonary Disease | Admitting: Pulmonary Disease

## 2023-05-13 DIAGNOSIS — L89222 Pressure ulcer of left hip, stage 2: Secondary | ICD-10-CM | POA: Diagnosis present

## 2023-05-13 DIAGNOSIS — J9811 Atelectasis: Secondary | ICD-10-CM | POA: Diagnosis present

## 2023-05-13 DIAGNOSIS — G9341 Metabolic encephalopathy: Secondary | ICD-10-CM | POA: Diagnosis present

## 2023-05-13 DIAGNOSIS — G8220 Paraplegia, unspecified: Secondary | ICD-10-CM | POA: Diagnosis present

## 2023-05-13 DIAGNOSIS — E872 Acidosis, unspecified: Secondary | ICD-10-CM | POA: Diagnosis present

## 2023-05-13 DIAGNOSIS — E440 Moderate protein-calorie malnutrition: Secondary | ICD-10-CM | POA: Diagnosis present

## 2023-05-13 DIAGNOSIS — Z79899 Other long term (current) drug therapy: Secondary | ICD-10-CM

## 2023-05-13 DIAGNOSIS — G35 Multiple sclerosis: Secondary | ICD-10-CM | POA: Diagnosis present

## 2023-05-13 DIAGNOSIS — E876 Hypokalemia: Secondary | ICD-10-CM | POA: Diagnosis present

## 2023-05-13 DIAGNOSIS — R64 Cachexia: Secondary | ICD-10-CM | POA: Diagnosis present

## 2023-05-13 DIAGNOSIS — L89621 Pressure ulcer of left heel, stage 1: Secondary | ICD-10-CM | POA: Diagnosis present

## 2023-05-13 DIAGNOSIS — Z86711 Personal history of pulmonary embolism: Secondary | ICD-10-CM

## 2023-05-13 DIAGNOSIS — J984 Other disorders of lung: Secondary | ICD-10-CM | POA: Diagnosis present

## 2023-05-13 DIAGNOSIS — E1165 Type 2 diabetes mellitus with hyperglycemia: Secondary | ICD-10-CM | POA: Diagnosis present

## 2023-05-13 DIAGNOSIS — L89611 Pressure ulcer of right heel, stage 1: Secondary | ICD-10-CM | POA: Diagnosis present

## 2023-05-13 DIAGNOSIS — Z1152 Encounter for screening for COVID-19: Secondary | ICD-10-CM | POA: Diagnosis not present

## 2023-05-13 DIAGNOSIS — Z820 Family history of epilepsy and other diseases of the nervous system: Secondary | ICD-10-CM

## 2023-05-13 DIAGNOSIS — F17210 Nicotine dependence, cigarettes, uncomplicated: Secondary | ICD-10-CM | POA: Diagnosis present

## 2023-05-13 DIAGNOSIS — L89154 Pressure ulcer of sacral region, stage 4: Secondary | ICD-10-CM | POA: Diagnosis present

## 2023-05-13 DIAGNOSIS — A4152 Sepsis due to Pseudomonas: Secondary | ICD-10-CM | POA: Diagnosis present

## 2023-05-13 DIAGNOSIS — Z930 Tracheostomy status: Secondary | ICD-10-CM | POA: Diagnosis not present

## 2023-05-13 DIAGNOSIS — F32A Depression, unspecified: Secondary | ICD-10-CM | POA: Diagnosis present

## 2023-05-13 DIAGNOSIS — Z931 Gastrostomy status: Secondary | ICD-10-CM

## 2023-05-13 DIAGNOSIS — Z7401 Bed confinement status: Secondary | ICD-10-CM

## 2023-05-13 DIAGNOSIS — J189 Pneumonia, unspecified organism: Secondary | ICD-10-CM | POA: Diagnosis present

## 2023-05-13 DIAGNOSIS — Z8249 Family history of ischemic heart disease and other diseases of the circulatory system: Secondary | ICD-10-CM

## 2023-05-13 DIAGNOSIS — R1310 Dysphagia, unspecified: Secondary | ICD-10-CM | POA: Diagnosis present

## 2023-05-13 DIAGNOSIS — R7401 Elevation of levels of liver transaminase levels: Secondary | ICD-10-CM | POA: Diagnosis present

## 2023-05-13 DIAGNOSIS — Z515 Encounter for palliative care: Secondary | ICD-10-CM | POA: Diagnosis not present

## 2023-05-13 DIAGNOSIS — L8990 Pressure ulcer of unspecified site, unspecified stage: Secondary | ICD-10-CM | POA: Insufficient documentation

## 2023-05-13 DIAGNOSIS — Z7189 Other specified counseling: Secondary | ICD-10-CM | POA: Diagnosis not present

## 2023-05-13 DIAGNOSIS — R6520 Severe sepsis without septic shock: Secondary | ICD-10-CM | POA: Diagnosis present

## 2023-05-13 DIAGNOSIS — E1169 Type 2 diabetes mellitus with other specified complication: Secondary | ICD-10-CM | POA: Diagnosis present

## 2023-05-13 DIAGNOSIS — J9601 Acute respiratory failure with hypoxia: Secondary | ICD-10-CM | POA: Diagnosis present

## 2023-05-13 DIAGNOSIS — M4628 Osteomyelitis of vertebra, sacral and sacrococcygeal region: Secondary | ICD-10-CM | POA: Diagnosis present

## 2023-05-13 DIAGNOSIS — R1312 Dysphagia, oropharyngeal phase: Secondary | ICD-10-CM | POA: Diagnosis not present

## 2023-05-13 DIAGNOSIS — I10 Essential (primary) hypertension: Secondary | ICD-10-CM | POA: Diagnosis present

## 2023-05-13 DIAGNOSIS — A419 Sepsis, unspecified organism: Secondary | ICD-10-CM

## 2023-05-13 DIAGNOSIS — Z681 Body mass index (BMI) 19 or less, adult: Secondary | ICD-10-CM | POA: Diagnosis not present

## 2023-05-13 DIAGNOSIS — N390 Urinary tract infection, site not specified: Secondary | ICD-10-CM | POA: Diagnosis present

## 2023-05-13 DIAGNOSIS — E119 Type 2 diabetes mellitus without complications: Secondary | ICD-10-CM | POA: Diagnosis not present

## 2023-05-13 DIAGNOSIS — R4182 Altered mental status, unspecified: Secondary | ICD-10-CM | POA: Diagnosis not present

## 2023-05-13 DIAGNOSIS — Z794 Long term (current) use of insulin: Secondary | ICD-10-CM

## 2023-05-13 DIAGNOSIS — J9621 Acute and chronic respiratory failure with hypoxia: Secondary | ICD-10-CM | POA: Diagnosis not present

## 2023-05-13 LAB — PHOSPHORUS: Phosphorus: 3.6 mg/dL (ref 2.5–4.6)

## 2023-05-13 LAB — URINALYSIS, W/ REFLEX TO CULTURE (INFECTION SUSPECTED)
Bilirubin Urine: NEGATIVE
Glucose, UA: >=500 mg/dL — AB
Ketones, ur: NEGATIVE mg/dL
Nitrite: NEGATIVE
Protein, ur: 100 mg/dL — AB
RBC / HPF: >50 RBC/hpf (ref 0–5)
Specific Gravity, Urine: 1.039 — ABNORMAL HIGH (ref 1.005–1.030)
WBC, UA: >50 WBC/hpf (ref 0–5)
pH: 5 (ref 5.0–8.0)

## 2023-05-13 LAB — BLOOD GAS, ARTERIAL
Acid-Base Excess: 7.7 mmol/L — ABNORMAL HIGH (ref 0.0–2.0)
Bicarbonate: 32.7 mmol/L — ABNORMAL HIGH (ref 20.0–28.0)
Drawn by: 11249
FIO2: 100 %
MECHVT: 580 mL
O2 Saturation: 95.1 %
PEEP: 8 cmH2O
Patient temperature: 39.2
RATE: 18 {breaths}/min
pCO2 arterial: 51 mm[Hg] — ABNORMAL HIGH (ref 32–48)
pH, Arterial: 7.43 (ref 7.35–7.45)
pO2, Arterial: 81 mm[Hg] — ABNORMAL LOW (ref 83–108)

## 2023-05-13 LAB — LACTIC ACID, PLASMA: Lactic Acid, Venous: 2.1 mmol/L (ref 0.5–1.9)

## 2023-05-13 LAB — CBC WITH DIFFERENTIAL/PLATELET
Abs Immature Granulocytes: 0.1 10*3/uL — ABNORMAL HIGH (ref 0.00–0.07)
Basophils Absolute: 0.1 10*3/uL (ref 0.0–0.1)
Basophils Relative: 0 %
Eosinophils Absolute: 0 10*3/uL (ref 0.0–0.5)
Eosinophils Relative: 0 %
HCT: 36 % — ABNORMAL LOW (ref 39.0–52.0)
Hemoglobin: 11.2 g/dL — ABNORMAL LOW (ref 13.0–17.0)
Immature Granulocytes: 1 %
Lymphocytes Relative: 7 %
Lymphs Abs: 0.9 10*3/uL (ref 0.7–4.0)
MCH: 27.2 pg (ref 26.0–34.0)
MCHC: 31.1 g/dL (ref 30.0–36.0)
MCV: 87.4 fL (ref 80.0–100.0)
Monocytes Absolute: 0.9 10*3/uL (ref 0.1–1.0)
Monocytes Relative: 7 %
Neutro Abs: 10.5 10*3/uL — ABNORMAL HIGH (ref 1.7–7.7)
Neutrophils Relative %: 85 %
Platelets: 550 10*3/uL — ABNORMAL HIGH (ref 150–400)
RBC: 4.12 MIL/uL — ABNORMAL LOW (ref 4.22–5.81)
RDW: 15.9 % — ABNORMAL HIGH (ref 11.5–15.5)
WBC: 12.4 10*3/uL — ABNORMAL HIGH (ref 4.0–10.5)
nRBC: 0 % (ref 0.0–0.2)

## 2023-05-13 LAB — COMPREHENSIVE METABOLIC PANEL
ALT: 113 U/L — ABNORMAL HIGH (ref 0–44)
AST: 94 U/L — ABNORMAL HIGH (ref 15–41)
Albumin: 2.5 g/dL — ABNORMAL LOW (ref 3.5–5.0)
Alkaline Phosphatase: 82 U/L (ref 38–126)
Anion gap: 10 (ref 5–15)
BUN: 34 mg/dL — ABNORMAL HIGH (ref 6–20)
CO2: 29 mmol/L (ref 22–32)
Calcium: 9 mg/dL (ref 8.9–10.3)
Chloride: 104 mmol/L (ref 98–111)
Creatinine, Ser: 0.49 mg/dL — ABNORMAL LOW (ref 0.61–1.24)
GFR, Estimated: >60 mL/min (ref >60)
Glucose, Bld: 399 mg/dL — ABNORMAL HIGH (ref 70–99)
Potassium: 4.3 mmol/L (ref 3.5–5.1)
Sodium: 143 mmol/L (ref 135–145)
Total Bilirubin: 0.3 mg/dL (ref 0.0–1.2)
Total Protein: 8.7 g/dL — ABNORMAL HIGH (ref 6.5–8.1)

## 2023-05-13 LAB — PROTIME-INR
INR: 1.1 (ref 0.8–1.2)
Prothrombin Time: 14.7 s (ref 11.4–15.2)

## 2023-05-13 LAB — HEMOGLOBIN A1C
Hgb A1c MFr Bld: 8.2 % — ABNORMAL HIGH (ref 4.8–5.6)
Mean Plasma Glucose: 188.64 mg/dL

## 2023-05-13 LAB — MRSA NEXT GEN BY PCR, NASAL: MRSA by PCR Next Gen: NOT DETECTED

## 2023-05-13 LAB — RESP PANEL BY RT-PCR (RSV, FLU A&B, COVID)  RVPGX2
Influenza A by PCR: NEGATIVE
Influenza B by PCR: NEGATIVE
Resp Syncytial Virus by PCR: NEGATIVE
SARS Coronavirus 2 by RT PCR: NEGATIVE

## 2023-05-13 LAB — I-STAT CG4 LACTIC ACID, ED: Lactic Acid, Venous: 2.1 mmol/L (ref 0.5–1.9)

## 2023-05-13 LAB — PROCALCITONIN: Procalcitonin: 0.48 ng/mL

## 2023-05-13 LAB — MAGNESIUM: Magnesium: 2.4 mg/dL (ref 1.7–2.4)

## 2023-05-13 LAB — LIPASE, BLOOD: Lipase: 47 U/L (ref 11–51)

## 2023-05-13 LAB — AMYLASE: Amylase: 76 U/L (ref 28–100)

## 2023-05-13 LAB — TSH: TSH: 2.076 u[IU]/mL (ref 0.350–4.500)

## 2023-05-13 LAB — AMMONIA: Ammonia: 16 umol/L (ref 9–35)

## 2023-05-13 LAB — APTT: aPTT: 29 s (ref 24–36)

## 2023-05-13 LAB — CK: Total CK: 34 U/L — ABNORMAL LOW (ref 49–397)

## 2023-05-13 MED ORDER — ETOMIDATE 2 MG/ML IV SOLN
INTRAVENOUS | Status: AC
  Administered 2023-05-13: 25 mg via INTRAVENOUS
  Filled 2023-05-13: qty 20

## 2023-05-13 MED ORDER — ORAL CARE MOUTH RINSE: 15.0000 mL | OROMUCOSAL | Status: DC | PRN

## 2023-05-13 MED ORDER — FENTANYL 2500MCG IN NS 250ML (10MCG/ML) PREMIX INFUSION
0.0000 ug/h | INTRAVENOUS | Status: DC
  Administered 2023-05-13: 25 ug/h via INTRAVENOUS
  Filled 2023-05-13: qty 250

## 2023-05-13 MED ORDER — INSULIN ASPART 100 UNIT/ML IJ SOLN
0.0000 [IU] | INTRAMUSCULAR | Status: DC
  Administered 2023-05-14 (×2): 4 [IU] via SUBCUTANEOUS
  Administered 2023-05-14: 3 [IU] via SUBCUTANEOUS
  Filled 2023-05-13: qty 0.06

## 2023-05-13 MED ORDER — FAMOTIDINE 20 MG PO TABS
20.0000 mg | ORAL_TABLET | Freq: Two times a day (BID) | ORAL | Status: DC
Start: 2023-05-13 — End: 2023-05-24
  Administered 2023-05-14 – 2023-05-24 (×20): 20 mg
  Filled 2023-05-13 (×22): qty 1

## 2023-05-13 MED ORDER — DOXYCYCLINE HYCLATE 100 MG IV SOLR
100.0000 mg | Freq: Two times a day (BID) | INTRAVENOUS | Status: DC
  Administered 2023-05-13 – 2023-05-17 (×8): 100 mg via INTRAVENOUS
  Filled 2023-05-13 (×8): qty 100

## 2023-05-13 MED ORDER — CHLORHEXIDINE GLUCONATE CLOTH 2 % EX PADS
6.0000 | MEDICATED_PAD | Freq: Every day | CUTANEOUS | Status: DC
  Administered 2023-05-13 – 2023-05-24 (×10): 6 via TOPICAL

## 2023-05-13 MED ORDER — ACETAMINOPHEN 10 MG/ML IV SOLN
1000.0000 mg | Freq: Four times a day (QID) | INTRAVENOUS | Status: AC
  Administered 2023-05-13 – 2023-05-14 (×4): 1000 mg via INTRAVENOUS
  Filled 2023-05-13 (×4): qty 100

## 2023-05-13 MED ORDER — SUCCINYLCHOLINE CHLORIDE 200 MG/10ML IV SOSY
PREFILLED_SYRINGE | INTRAVENOUS | Status: AC
  Filled 2023-05-13: qty 10

## 2023-05-13 MED ORDER — ACETAMINOPHEN 160 MG/5ML PO SOLN
650.0000 mg | Freq: Four times a day (QID) | ORAL | Status: DC | PRN
  Administered 2023-05-14 – 2023-05-23 (×13): 650 mg
  Filled 2023-05-13 (×13): qty 20.3

## 2023-05-13 MED ORDER — METRONIDAZOLE 500 MG/100ML IV SOLN
500.0000 mg | Freq: Once | INTRAVENOUS | Status: AC
  Administered 2023-05-13: 500 mg via INTRAVENOUS
  Filled 2023-05-13: qty 100

## 2023-05-13 MED ORDER — SUCCINYLCHOLINE CHLORIDE 200 MG/10ML IV SOSY: 120.0000 mg | PREFILLED_SYRINGE | Freq: Once | INTRAVENOUS | Status: AC

## 2023-05-13 MED ORDER — ORAL CARE MOUTH RINSE
15.0000 mL | OROMUCOSAL | Status: DC
  Administered 2023-05-14 – 2023-05-18 (×56): 15 mL via OROMUCOSAL

## 2023-05-13 MED ORDER — NITROGLYCERIN IN D5W 200-5 MCG/ML-% IV SOLN
INTRAVENOUS | Status: AC
  Filled 2023-05-13: qty 250

## 2023-05-13 MED ORDER — FENTANYL CITRATE PF 50 MCG/ML IJ SOSY
50.0000 ug | PREFILLED_SYRINGE | INTRAMUSCULAR | Status: DC | PRN
  Administered 2023-05-14 (×2): 50 ug via INTRAVENOUS

## 2023-05-13 MED ORDER — SUCCINYLCHOLINE CHLORIDE 200 MG/10ML IV SOSY
PREFILLED_SYRINGE | INTRAVENOUS | Status: AC
  Administered 2023-05-13: 120 mg via INTRAVENOUS
  Filled 2023-05-13: qty 10

## 2023-05-13 MED ORDER — ETOMIDATE 2 MG/ML IV SOLN
25.0000 mg | Freq: Once | INTRAVENOUS | Status: AC
Start: 2023-05-13 — End: 2023-05-13

## 2023-05-13 MED ORDER — ROCURONIUM BROMIDE 10 MG/ML (PF) SYRINGE
PREFILLED_SYRINGE | INTRAVENOUS | Status: AC
  Filled 2023-05-13: qty 10

## 2023-05-13 MED ORDER — VANCOMYCIN HCL 1750 MG/350ML IV SOLN
1750.0000 mg | Freq: Two times a day (BID) | INTRAVENOUS | Status: DC
  Filled 2023-05-13: qty 350

## 2023-05-13 MED ORDER — IBUPROFEN 100 MG/5ML PO SUSP
400.0000 mg | Freq: Three times a day (TID) | ORAL | Status: DC | PRN
  Administered 2023-05-17 – 2023-05-23 (×4): 400 mg
  Filled 2023-05-13 (×6): qty 20

## 2023-05-13 MED ORDER — SODIUM CHLORIDE 0.9 % IV SOLN: INTRAVENOUS | Status: DC | PRN

## 2023-05-13 MED ORDER — VANCOMYCIN HCL IN DEXTROSE 1-5 GM/200ML-% IV SOLN
1000.0000 mg | Freq: Once | INTRAVENOUS | Status: AC
  Administered 2023-05-13: 1000 mg via INTRAVENOUS
  Filled 2023-05-13: qty 200

## 2023-05-13 MED ORDER — DOCUSATE SODIUM 50 MG/5ML PO LIQD
100.0000 mg | Freq: Two times a day (BID) | ORAL | Status: DC
  Administered 2023-05-14 – 2023-05-23 (×13): 100 mg
  Filled 2023-05-13 (×15): qty 10

## 2023-05-13 MED ORDER — SODIUM CHLORIDE 0.9% FLUSH
3.0000 mL | Freq: Two times a day (BID) | INTRAVENOUS | Status: DC
Start: 2023-05-13 — End: 2023-05-24
  Administered 2023-05-13 – 2023-05-14 (×2): 10 mL via INTRAVENOUS
  Administered 2023-05-15 (×2): 3 mL via INTRAVENOUS
  Administered 2023-05-16: 10 mL via INTRAVENOUS
  Administered 2023-05-16: 5 mL via INTRAVENOUS
  Administered 2023-05-17 – 2023-05-24 (×15): 10 mL via INTRAVENOUS

## 2023-05-13 MED ORDER — SODIUM CHLORIDE 0.9% FLUSH: 10.0000 mL | INTRAVENOUS | Status: DC | PRN

## 2023-05-13 MED ORDER — SODIUM CHLORIDE 0.9% FLUSH: 3.0000 mL | INTRAVENOUS | Status: DC | PRN

## 2023-05-13 MED ORDER — PROPOFOL 1000 MG/100ML IV EMUL
5.0000 ug/kg/min | INTRAVENOUS | Status: DC
Start: 2023-05-13 — End: 2023-05-13
  Administered 2023-05-13: 5 ug/kg/min via INTRAVENOUS
  Filled 2023-05-13: qty 100

## 2023-05-13 MED ORDER — ETOMIDATE 2 MG/ML IV SOLN
INTRAVENOUS | Status: AC
  Filled 2023-05-13: qty 20

## 2023-05-13 MED ORDER — PROPOFOL 1000 MG/100ML IV EMUL
0.0000 ug/kg/min | INTRAVENOUS | Status: DC
  Filled 2023-05-13: qty 100

## 2023-05-13 MED ORDER — PIPERACILLIN-TAZOBACTAM 3.375 G IVPB
3.3750 g | Freq: Three times a day (TID) | INTRAVENOUS | Status: DC
  Administered 2023-05-14 – 2023-05-21 (×22): 3.375 g via INTRAVENOUS
  Filled 2023-05-13 (×22): qty 50

## 2023-05-13 MED ORDER — LACTATED RINGERS IV SOLN: INTRAVENOUS | Status: DC

## 2023-05-13 MED ORDER — SODIUM CHLORIDE 0.9 % IV SOLN
2.0000 g | Freq: Once | INTRAVENOUS | Status: AC
  Administered 2023-05-13: 2 g via INTRAVENOUS
  Filled 2023-05-13: qty 12.5

## 2023-05-13 NOTE — ED Notes (Signed)
 Pt family contacted by Dr. Rhae Hammock and states they want everything done on pt, pt will be intubated due to low O2 sat. Pt not tolerating BIPAP

## 2023-05-13 NOTE — ED Notes (Signed)
 Pt desat to 88%, placed on 2L [place] and suctioned secretions  Ox sats rebounded to 92%

## 2023-05-13 NOTE — ED Provider Notes (Addendum)
 [place] [facility]MENT AT [facility] Provider Note   CSN: [id number] Arrival date & time: 05/13/23  1601     History  Chief Complaint  Patient presents with   Altered Mental Status    [name] is a 43 y.o. male.  43 year old male with past medical history of end-stage MS and sacral osteomyelitis presenting to the emergency department today with fever and altered mental status.  The patient was sent in by his skilled nursing facility after they noted that he had a fever this morning.  The patient normally is able to speak 1 word at a time but is end-stage with his MS.  He was on hospice but family apparently decided to revoke this within the past week.  He was a DNR but is no longer.  He was sent to the emergency department from the nursing facility for further evaluation.  The patient was admitted last month for sacral osteomyelitis.   Altered Mental Status      Home Medications Prior to Admission medications   Medication Sig Start Date End Date Taking? Authorizing Provider  Amino Acids -Protein Hydrolys (PRO-STAT) LIQD Place 60 mLs into feeding tube in the morning and at bedtime.   Yes [provider]  atropine 1 % ophthalmic solution Place 1 drop under the tongue every 4 (four) hours as needed (palliative care respiratory secretions). 05/08/23  Yes [provider]  enoxaparin  (LOVENOX ) 60 MG/0.6ML injection Inject 60 mg into the skin every 12 (twelve) hours.   Yes [provider]  ertapenem (INVANZ) 1 g injection Inject 1 g into the muscle daily. 05/05/23  Yes [provider]  Fingolimod  HCl (GILENYA ) 0.5 MG CAPS Place 0.5 mg into feeding tube daily.   Yes [provider]  fluconazole (DIFLUCAN) 200 MG tablet Place 200 mg into feeding tube daily. 05/10/23 05/16/23 Yes [provider]  gabapentin  (NEURONTIN ) 250 MG/5ML solution Place 2 mLs (100 mg total) into feeding tube daily. 02/06/23  Yes Danford,  Lonni SQUIBB, MD  Heparin Sod, Pork, Lock Flush (HEPARIN FLUSH) 10 UNIT/ML SOLN injection 10 Units See admin instructions. Flush with 10 units every  shift   Yes [provider]  insulin  aspart (NOVOLOG ) 100 UNIT/ML injection Inject 0-10 Units into the skin in the morning, at noon, in the evening, and at bedtime. BS 60-150 inject 0 units  BS 151-199 inject 2 units  BS 200-249 inject 4 units  BS 250-299 inject 6 units  BS 300-349 inject 8 units  BS 350-399 inject 10 units  BS [phone] contact MD   Yes [provider]  Insulin  Glargine (BASAGLAR  KWIKPEN) 100 UNIT/ML Inject 10 Units into the skin at bedtime.   Yes [provider]  lidocaine  4 % Place 1 patch onto the skin See admin instructions. Apply 1 patch to the lower back and lower extremities once a day- remove as directed   Yes [provider]  nutrition supplement, JUVEN, (JUVEN) PACK Take 1 packet by mouth 2 (two) times daily between meals. Patient taking differently: Place 1 packet into feeding tube daily. 04/13/23  Yes Danford, Lonni SQUIBB, MD  Nutritional Supplements (FEEDING SUPPLEMENT, GLUCERNA 1.5 CAL,) LIQD Place 65 mL/hr into feeding tube continuous. Total 1521mL/24 hours.   Yes [provider]  oxyCODONE -acetaminophen  (PERCOCET/ROXICET) 5-325 MG tablet Place 1 tablet into feeding tube every 8 (eight) hours. PRN order: take 1 tablet per tube every 12 hours as needed for muscle pain   Yes [provider]  potassium chloride  20 MEQ/15ML (10%) SOLN Place 7.5 mLs (10 mEq total) into feeding tube daily. 02/05/23  Yes Danford, Lonni SQUIBB, MD  propranolol  (INDERAL ) 20 MG tablet Place 20 mg into feeding tube 2 (two) times daily.   Yes [provider]  sertraline  (ZOLOFT ) 20 MG/ML concentrated solution Place 5 mLs (100 mg total) into feeding tube daily. 02/05/23  Yes Danford, Lonni SQUIBB, MD  sodium hypochlorite (DAKIN'S 1/2 STRENGTH) external solution Apply 1 Application  topically daily. Sacral ulcer   Yes [provider]  thiamine  (VITAMIN B-1) 100 MG tablet Place 1 tablet (100 mg total) into feeding tube daily. 02/05/23  Yes Danford, Lonni SQUIBB, MD  Water  For Irrigation, Sterile (FREE WATER ) SOLN Place 200 mLs into feeding tube every 4 (four) hours. Patient taking differently: Place 300 mLs into feeding tube every 4 (four) hours. 04/13/23  Yes Danford, Lonni SQUIBB, MD  acetaminophen  (TYLENOL ) 325 MG tablet Place 650 mg into feeding tube 3 (three) times daily as needed for fever or mild pain (pain score 1-3). PRN order: take 650 mg per tube every 8 hours as needed for pain/fever Patient not taking: Reported on 05/13/2023    [provider]  cefTRIAXone  (ROCEPHIN ) 1 g injection Inject 1 g into the muscle once. One time only after lab work has been drawn. Patient not taking: Reported on 05/13/2023 05/08/23   [provider]  Emollient (EUCERIN INTENSIVE REPAIR EX) Apply 1 application  topically See admin instructions. Apply to face and body in the morning and at bedtime Patient not taking: Reported on 05/13/2023    [provider]  furosemide (LASIX) 20 MG tablet Place 40 mg into feeding tube once. One time only after labs have been drawn. Patient not taking: Reported on 05/13/2023 05/08/23   [provider]  methocarbamol  (ROBAXIN ) 500 MG tablet Place 500 mg into feeding tube every 6 (six) hours as needed for muscle spasms. Patient not taking: Reported on 05/13/2023    [provider]  polyethylene glycol powder (GLYCOLAX /MIRALAX ) 17 GM/SCOOP powder Place 17 g into feeding tube daily. Patient not taking: Reported on 05/13/2023 02/05/23   Jonel Lonni SQUIBB, MD  sodium chloride  0.9 % infusion Inject 2,000 mLs into the vein once. Patient not taking: Reported on 05/13/2023 05/05/23   [provider]      Allergies    Patient has no known allergies.    Review of Systems   Review of Systems  Reason unable  to perform ROS: Mental status.    Physical Exam Updated Vital Signs BP (!) 135/105   Pulse (!) 103   Temp (!) 101.4 F (38.6 C)   Resp (!) 24   SpO2 95%  Physical Exam Vitals and nursing note reviewed.   Gen: Chronically ill appearing, somnolent but will arouse to verbal stimuli Eyes: PERRL, EOMI HEENT: no oropharyngeal swelling, dry mucous membranes Neck: trachea midline Resp: clear to auscultation bilaterally Card: RRR, no murmurs, rubs, or gallops Abd: nontender, nondistended, PEG tube in place Extremities: no calf tenderness, no edema Vascular: 2+ radial pulses bilaterally, 2+ DP pulses bilaterally Neuro: Somnolent but will arouse to verbal stimuli, unable to move extremities at baseline Skin: no rashes Psyc: acting appropriately   ED Results / Procedures / Treatments   Labs (all labs ordered are listed, but only abnormal results are displayed) Labs Reviewed  COMPREHENSIVE METABOLIC PANEL - Abnormal; Notable for the following components:      Result Value   Glucose, Bld 399 (*)  BUN 34 (*)    Creatinine, Ser 0.49 (*)    Total Protein 8.7 (*)    Albumin 2.5 (*)    AST 94 (*)    ALT 113 (*)    All other components within normal limits  CBC WITH DIFFERENTIAL/PLATELET - Abnormal; Notable for the following components:   WBC 12.4 (*)    RBC 4.12 (*)    Hemoglobin 11.2 (*)    HCT 36.0 (*)    RDW 15.9 (*)    Platelets 550 (*)    Neutro Abs 10.5 (*)    Abs Immature Granulocytes 0.10 (*)    All other components within normal limits  URINALYSIS, W/ REFLEX TO CULTURE (INFECTION SUSPECTED) - Abnormal; Notable for the following components:   APPearance HAZY (*)    Specific Gravity, Urine 1.039 (*)    Glucose, UA >=500 (*)    Hgb urine dipstick SMALL (*)    Protein, ur 100 (*)    Leukocytes,Ua SMALL (*)    Bacteria, UA RARE (*)    All other components within normal limits  RESP PANEL BY RT-PCR (RSV, FLU A&B, COVID)  RVPGX2  CULTURE, BLOOD (ROUTINE X 2)  CULTURE,  BLOOD (ROUTINE X 2)  URINE CULTURE  PROTIME-INR  APTT  I-STAT CG4 LACTIC ACID, ED    EKG EKG Interpretation Date/Time:  Sunday May 13 2023 16:25:21 EST Ventricular Rate:  103 PR Interval:  139 QRS Duration:  81 QT Interval:  324 QTC Calculation: 425 R Axis:   121  Text Interpretation: Right and left arm electrode reversal, interpretation assumes no reversal Sinus tachycardia Right axis deviation Abnormal R-wave progression, early transition Nonspecific T abnormalities, lateral leads Confirmed by Ula Barter [phone]) on 05/13/2023 4:33:06 PM  Radiology DG Chest Port 1 View Result Date: 05/13/2023 CLINICAL DATA:  Questionable sepsis - evaluate for abnormality EXAM: PORTABLE CHEST - 1 VIEW COMPARISON:  04/09/2023 FINDINGS: New airspace opacity at the right lung base.  Left lung clear. Heart size and mediastinal contours are within normal limits. No effusion. Visualized bones unremarkable. IMPRESSION: New right basilar airspace disease. Electronically Signed   By: JONETTA Faes M.D.   On: 05/13/2023 17:05    Procedures Procedure Name: Intubation Date/Time: 05/13/2023 6:24 PM  Performed by: Ula Barter SAUNDERS, MDPre-anesthesia Checklist: Patient identified Oxygen Delivery Method: Ambu bag Preoxygenation: Pre-oxygenation with 100% oxygen Induction Type: Rapid sequence Ventilation: Mask ventilation without difficulty Laryngoscope Size: Mac and 4 Grade View: Grade II Tube size: 7.5 mm Number of attempts: 1 Placement Confirmation: ETT inserted through vocal cords under direct vision, CO2 detector, Breath sounds checked- equal and bilateral and Positive ETCO2 Secured at: 25 cm Tube secured with: ETT holder Future Recommendations: Recommend- induction with short-acting agent, and alternative techniques readily available        Medications Ordered in ED Medications  lactated ringers  infusion ( Intravenous New Bag/Given 05/13/23 1633)  ceFEPIme  (MAXIPIME ) 2 g in sodium chloride  0.9 % 100  mL IVPB (has no administration in time range)  metroNIDAZOLE  (FLAGYL ) IVPB 500 mg (500 mg Intravenous New Bag/Given 05/13/23 1717)  vancomycin  (VANCOCIN ) IVPB 1000 mg/200 mL premix (has no administration in time range)  acetaminophen  (OFIRMEV ) IV 1,000 mg (has no administration in time range)    ED Course/ Medical Decision Making/ A&P                                 Medical Decision Making 43 year old male with  past medical history of end-stage MS and sacral osteomyelitis presenting to the emergency department today with fever and altered mental status.  I will initiate a sepsis workup and cover the patient with broad-spectrum antibiotics.  Since the patient's family has decided to revoke his hospice care he will require admission.  The patient's white blood cell count is elevated.  X-ray does show findings concerning for pneumonia.  He otherwise remains hemodynamically stable.  Calls placed to hospital service for admission.  Shortly after the patient was admitted to the hospitalist service I was called to the room.  The patient's pulse ox had dropped to the 40s and he was in respiratory distress.  We did bag the patient and briefly placed him on BiPAP and his pulse ox did improve to the high 80s on maximum settings.  This did give me the chance to call and talk with his family.  I did speak with his brother who is his power of attorney who did inform me that he would want invasive procedures such as intubation if the need were to arise.  The patient was subsequently intubated.  A call was placed to ICU for admission.  CRITICAL CARE Performed by: Prentice JONELLE Medicus   Total critical care time: 36 minutes  Critical care time was exclusive of separately billable procedures and treating other patients.  Critical care was necessary to treat or prevent imminent or life-threatening deterioration.  Critical care was time spent personally by me on the following activities: development of treatment plan  with patient and/or surrogate as well as nursing, discussions with consultants, evaluation of patient's response to treatment, examination of patient, obtaining history from patient or surrogate, ordering and performing treatments and interventions, ordering and review of laboratory studies, ordering and review of radiographic studies, pulse oximetry and re-evaluation of patient's condition.   Amount and/or Complexity of Data Reviewed Labs: ordered. Radiology: ordered.  Risk Prescription drug management. Decision regarding hospitalization.           Final Clinical Impression(s) / ED Diagnoses Final diagnoses:  Pneumonia due to infectious organism, unspecified laterality, unspecified part of lung  Sepsis, due to unspecified organism, unspecified whether acute organ dysfunction present [facility])    Rx / DC Orders ED Discharge Orders     None         Medicus Prentice JONELLE, MD 05/13/23 1747    Medicus Prentice JONELLE, MD 05/13/23 [id number]

## 2023-05-13 NOTE — Progress Notes (Addendum)
 [facility] Physician-Brief Progress Note Patient Name: [name] DOB: 1980-12-25 MRN: [id number]   Date of Service  05/13/2023  HPI/Events of Note  45 M hx of MS and sacral osteo brought in from SNF for fever and AMS. He went into respiratory distress and was intubated  after failed BiPap. New ulcerations were noted on his gluteus. Right sided pneumonia on CT with debris noted in bronchus intermedius. UA with > 50 WBC.  eICU Interventions  On vancomycin  and piperacillin  tazobactam for aspiration pneumonia, SSI and UTI If airway does not open up may need bronch, discussed with bedside CCM     Intervention Category Evaluation Type: New Patient Evaluation  [name] 05/13/2023, 10:52 PM

## 2023-05-13 NOTE — Sepsis Progress Note (Signed)
 Notified bedside RN of need for lactic acid for the protocol. It has been delayed due to emergent bedside care including intubation.

## 2023-05-13 NOTE — Progress Notes (Signed)
 Patient transported to CT then transferred to ICU without any issues.

## 2023-05-13 NOTE — Sepsis Progress Note (Signed)
 Sepsis protocol is being followed by [facility].

## 2023-05-13 NOTE — ED Triage Notes (Signed)
 PT BIBA from [facility] for change in mental status.  EMS states pt is no longer tracking with his eyes or able to communicate.  Family recently revoked hospice care, patient is a full code.

## 2023-05-13 NOTE — ED Notes (Signed)
 Pt O2 sat dropped, respiratory called. Pt being bagged at present time. Dr Rhae Hammock aware, calling family to verify that they want pt intubated due to previous DNR status

## 2023-05-13 NOTE — H&P (Signed)
 NAME:  [name], MRN:  [id number], DOB:  12-09-1980, LOS: 0 ADMISSION DATE:  05/13/2023, CONSULTATION DATE:  05/13/23 REFERRING MD:  Prentice Medicus, M.D. CHIEF COMPLAINT:  Altered Mental Status  History of Present Illness:  43 y.o. male with end-stage multiple sclerosis and sacral osteomyelitis.  Presented to the emergency department today with altered mental status and fever.  Patient currently resides at a skilled nursing facility and with these changes EMS transported the patient to the ED for evaluation.  Normally patient is able to speak 1 word at a time.  Previously patient was on hospice but family decided so back to a Full Code Status.  Review of the electronic medical record indicates patient was admitted from 12/9 through 04/13/2023 for osteomyelitis of the coccyx and sacrum as well as a stage IV sacral decubitus ulcer.  Patient was treated with antibiotics which were stopped at the time of discharge.  Palliative Care Medicine was planned to continue following at that time.  Following arrival patient was evaluated and admitted by hospitalist service.  Shortly after the admission ED provider was called to the patient's room with hypoxia and respiratory distress.  Patient under went bag mask ventilation and then was placed on BiPAP for respiratory support.  After contacting the patient's family/brother the patient was intubated this evening in the emergency department by the ED provider.  With concern for sepsis patient was given bolus IV fluid with lactated Ringer 's and empiric antibiotic therapy with vancomycin , Flagyl , and cefepime . I personally spoke with the patient's bedside nurse and he remains febrile despite receiving Tylenol . Patient notably still has a sacral decubitus ulcer and the beginning of another ulceration on his gluteus noted by nursing staff.  Pertinent  Medical History  N/A  Significant Hospital Events: Including procedures, antibiotic start and stop dates in addition to  other pertinent events   01/12 - Presented to ED & family revoked hospice status >> intubated by EDP >> PCCM consulted for admission  Interim History / Subjective:  N/A  Objective   Blood pressure (!) 129/96, pulse (!) 105, temperature (!) 101.8 F (38.8 C), resp. rate (!) 21, height 5' 10 (1.778 m), weight 89.5 kg, SpO2 100%.    Vent Mode: PRVC FiO2 (%):  [100 %] 100 % Set Rate:  [18 bmp] 18 bmp Vt Set:  [580 mL] 580 mL PEEP:  [5 cmH20-10 cmH20] 8 cmH20 Plateau Pressure:  [23 cmH20] 23 cmH20   Intake/Output Summary (Last 24 hours) at 05/13/2023 1909 Last data filed at 05/13/2023 1850 Gross per 24 hour  Intake 80.9 ml  Output --  Net 80.9 ml   Filed Weights   05/13/23 1825  Weight: 89.5 kg    Examination: General: Thin, cachectic male.  No acute distress. No family at bedside.  Respiratory therapist at bedside Integument:  Warm & dry. No rash or bruising on exposed skin.  No induration or discharge around left upper extremity PICC line.  Dressing surrounding PEG tube is clean and dry. Extremities:  No cyanosis or clubbing.  Lymphatics:  No appreciated cervical or supraclavicular lymphadenopathy. HEENT:  Moist mucus membranes. No scleral icterus. Endotracheal tube in place. Cardiovascular:  Regular rate & rhythm. No edema or JVD appreciated. No murmur appreciated. Pulmonary:  Coarse sounds bilaterally. Symmetric chest wall rise on ventilator. Good aeration bilaterally. Abdomen: Soft. Nondistended. Hypoactive bowel sounds.  PEG tube in place. Musculoskeletal: Symmetrically decreased muscle bulk. No joint deformity or effusion appreciated. Neurological:  Patient sedated on Propofol  drip. Symmetric  face. Pupils symmetric. No spontaneous movements.  Psychiatric:  Unable to assess with patient intubated & sedated.  Resolved Hospital Problem list   N/A  Assessment & Plan:  43 year old male presenting with acute hypoxic respiratory failure and severe sepsis.  Patient ultimately  required endotracheal intubation due to worsening mental status and acute hypoxic respiratory failure that deteriorated to the point where noninvasive mechanical ventilation was unable to maintain his saturation.  1.  Severe sepsis: Present on arrival.  Potential sources include UTI, bacteremia as a result of cellulitis from his sacral decubitus ulcer, and right lung pneumonia.  Continuing to trend lactic acid and checking serum procalcitonin.  Holding on further fluid resuscitation given patient's degree of hypoxia and given his normotension at this time.  Infectious workup as follows.  2.  Acute encephalopathy: Present on admission.  Patient primarily speaks 1 word answers at baseline.  Secondary to acute metabolic encephalopathy as a result of his severe sepsis. Checking serum TSH, free T4, B12, and ammonia.  3.  Acute hypoxic respiratory failure: Multifactorial in etiology with his right lung pneumonia as well as his end-stage multiple sclerosis.  Patient failed noninvasive mechanical ventilation in the ED.  Continuing to wean FiO2 to maintain saturation greater than 92%.  Adjusting target tidal volume to 6 cc/kg ideal body weight.  Monitoring with continuous pulse oximetry.  Utilizing intermittent IV fentanyl  as well as propofol  infusion to maintain ventilator synchrony.  I did discuss the fact that the patient is unlikely to liberate from mechanical ventilation with his brother by phone this evening and we also discussed the potential need to broach the topic of tracheostomy placement.  4.  Right lung pneumonia: Patient notably hospitalized within the last month and also received IV antibiotics.  Checking urine streptococcal and Legionella antigens.  Blood cultures are pending.  Checking MRSA nasal PCR and respiratory culture.  Given prior antibiotic exposure I am initiating treatment with vancomycin , Zosyn , and doxycycline .  Checking CT chest without contrast to better evaluate his underlying lung  parenchyma and determine whether or not a right pleural effusion is present.  5.  Possible UTI:  Urinalysis is suggestive of an acute infection.  Awaiting urine culture.  Continuing empiric treatment with vancomycin  and Zosyn .  6.  Sacral decubitus ulcer: Present on admission.  Consulting wound care.  Blood cultures are pending.  Given potential for bacteremia continuing empiric antibiotic therapy as per #3.  7.  Transaminitis: New this admission.  Checking serum CK and acute hepatitis panel.  Also checking right upper quadrant ultrasound.  Repeating LFTs with morning labs.  8.  Lactic acidosis: Mild.  Secondary to sepsis.  Repeating lactic acid.  Holding on further IV fluid given tenuous respiratory status.  9.  Diabetes mellitus: Monitoring blood glucose with Accu-Cheks every 4 hours.  Treating with very low-dose sliding scale insulin  as per algorithm.  10.  End-stage multiple sclerosis: Patient with notable generalized weakness as well as paraplegia.  Previously was on hospice but they elected to take the patient out of hospice care and reversed to a full CODE STATUS.  11.  Family update: I was unable to reach the patient's daughter by phone.  I was able to reach his brother however and informed him of his brother's current clinical state.  Unfortunately family have been unable to reach his daughter who is his legal surrogate management consultant.  12.  Disposition: Admitting patient to ICU for further treatment, workup, and stabilization.  Best Practice (right click and Reselect all  SmartList Selections daily)   Diet/type: NPO DVT prophylaxis SCD Pressure ulcer(s): present on admission  GI prophylaxis: H2B Lines: yes and it is still needed Foley:  N/A Code Status:  full code Last date of multidisciplinary goals of care discussion [Pending]  Labs   CBC: Recent Labs  Lab 05/13/23 1636  WBC 12.4*  NEUTROABS 10.5*  HGB 11.2*  HCT 36.0*  MCV 87.4  PLT 550*    Basic Metabolic  Panel: Recent Labs  Lab 05/13/23 1636  NA 143  K 4.3  CL 104  CO2 29  GLUCOSE 399*  BUN 34*  CREATININE 0.49*  CALCIUM 9.0   GFR: Estimated Creatinine Clearance: 135.4 mL/min (A) (by C-G formula based on SCr of 0.49 mg/dL (L)). Recent Labs  Lab 05/13/23 1636 05/13/23 1857  WBC 12.4*  --   LATICACIDVEN  --  2.1*    Liver Function Tests: Recent Labs  Lab 05/13/23 1636  AST 94*  ALT 113*  ALKPHOS 82  BILITOT 0.3  PROT 8.7*  ALBUMIN 2.5*   No results for input(s): LIPASE, AMYLASE in the last 168 hours. No results for input(s): AMMONIA in the last 168 hours.  ABG    Component Value Date/Time   HCO3 26.7 01/31/2023 1436   TCO2 30 04/09/2023 1309   O2SAT 72.8 01/31/2023 1436     Coagulation Profile: Recent Labs  Lab 05/13/23 1636  INR 1.1    Cardiac Enzymes: No results for input(s): CKTOTAL, CKMB, CKMBINDEX, TROPONINI in the last 168 hours.  HbA1C: Hgb A1c MFr Bld  Date/Time Value Ref Range Status  11/08/2021 06:08 AM 4.9 4.8 - 5.6 % Final    Comment:    (NOTE) Pre diabetes:          5.7%-6.4%  Diabetes:              >6.4%  Glycemic control for   <7.0% adults with diabetes     CBG: No results for input(s): GLUCAP in the last 168 hours.  IMAGING: PORTABLE CXR 05/13/23 (personally reviewed by me): Trachea midline.  Compared with chest x-ray imaging performed previously patient has a new hazy right basilar opacity.  There are no obvious air bronchograms; therefore, a right pleural effusion cannot be completely excluded.  No left pleural effusion.  Trachea midline.  Endotracheal tube in acceptable position.  No pneumothorax.  Enteric feeding tube courses below the diaphragm.  Review of Systems:   Unable to obtain given intubation & sedation.   Past Medical History:   Past Medical History:  Diagnosis Date   Acute respiratory failure with hypoxia (HCC) 02/01/2023   Cognitive communication deficit    Depression    Diabetes  mellitus without complication (HCC)    Dysarthria and anarthria    Eye abnormalities    unspecified by pt   Hypertension    Multiple sclerosis (HCC)    Paraplegia (HCC)    Severe sepsis due to aspiration pneumonia (HCC) 01/31/2023   Weakness    generalized    Surgical History:   Past Surgical History:  Procedure Laterality Date   LAPAROSCOPIC INSERTION GASTROSTOMY TUBE N/A 02/02/2023   Procedure: LAPAROSCOPIC INSERTION GASTROSTOMY TUBE;  Surgeon: Stevie Herlene Righter, MD;  Location: [facility];  Service: General;  Laterality: N/A;   RADIOLOGY WITH ANESTHESIA N/A 11/11/2021   Procedure: MRI WITH ANESTHESIA BRAIN MRI WITH T SPINE AND CERVICAL SPINE W/O CONTRAST;  Surgeon: Radiologist, Medication, MD;  Location: [facility] OR;  Service: Radiology;  Laterality: N/A;   WISDOM TOOTH  EXTRACTION       Social History:   Social History   Socioeconomic History   Marital status: Married    Spouse name: Not on file   Number of children: 2   Years of education: 12   Highest education level: Not on file  Occupational History   Not on file  Tobacco Use   Smoking status: Every Day    Current packs/day: 1.00    Types: Cigarettes   Smokeless tobacco: Never  Vaping Use   Vaping status: Never Used  Substance and Sexual Activity   Alcohol use: Not Currently    Alcohol/week: 12.0 standard drinks of alcohol    Types: 12 Cans of beer per week   Drug use: Not Currently    Frequency: 30.0 times per week    Types: Marijuana    Comment: Daily   Sexual activity: Not Currently  Other Topics Concern   Not on file  Social History Narrative   Patient lives at home with his wife and children.    Patient is presently not working. Patient has a high school education.   Adopted.   Right-handed.   At least 8 cups caffeine daily.   Social Drivers of Corporate Investment Banker Strain: Not on file  Food Insecurity: No Food Insecurity (04/09/2023)   Hunger Vital Sign    Worried About Running Out of Food in  the Last Year: Never true    Ran Out of Food in the Last Year: Never true  Transportation Needs: No Transportation Needs (04/09/2023)   PRAPARE - Administrator, Civil Service (Medical): No    Lack of Transportation (Non-Medical): No  Physical Activity: Not on file  Stress: Not on file  Social Connections: Not on file    Family History:  His family history includes Heart disease in his mother; Multiple sclerosis in his cousin; Other in his father. He was adopted.   Allergies No Known Allergies   Home Medications  Prior to Admission medications   Medication Sig Start Date End Date Taking? Authorizing Provider  Amino Acids -Protein Hydrolys (PRO-STAT) LIQD Place 60 mLs into feeding tube in the morning and at bedtime.   Yes [provider]  atropine 1 % ophthalmic solution Place 1 drop under the tongue every 4 (four) hours as needed (palliative care respiratory secretions). 05/08/23  Yes [provider]  enoxaparin  (LOVENOX ) 60 MG/0.6ML injection Inject 60 mg into the skin every 12 (twelve) hours.   Yes [provider]  ertapenem (INVANZ) 1 g injection Inject 1 g into the muscle daily. 05/05/23  Yes [provider]  Fingolimod  HCl (GILENYA ) 0.5 MG CAPS Place 0.5 mg into feeding tube daily.   Yes [provider]  fluconazole (DIFLUCAN) 200 MG tablet Place 200 mg into feeding tube daily. 05/10/23 05/16/23 Yes [provider]  gabapentin  (NEURONTIN ) 250 MG/5ML solution Place 2 mLs (100 mg total) into feeding tube daily. 02/06/23  Yes Danford, Lonni SQUIBB, MD  Heparin Sod, Pork, Lock Flush (HEPARIN FLUSH) 10 UNIT/ML SOLN injection 10 Units See admin instructions. Flush with 10 units every  shift   Yes [provider]  insulin  aspart (NOVOLOG ) 100 UNIT/ML injection Inject 0-10 Units into the skin in the morning, at noon, in the evening, and at bedtime. BS 60-150 inject 0 units  BS 151-199 inject 2 units  BS 200-249 inject 4 units   BS 250-299 inject 6 units  BS 300-349 inject 8 units  BS 350-399 inject  10 units  BS [phone] contact MD   Yes [provider]  Insulin  Glargine (BASAGLAR  KWIKPEN) 100 UNIT/ML Inject 10 Units into the skin at bedtime.   Yes [provider]  lidocaine  4 % Place 1 patch onto the skin See admin instructions. Apply 1 patch to the lower back and lower extremities once a day- remove as directed   Yes [provider]  nutrition supplement, JUVEN, (JUVEN) PACK Take 1 packet by mouth 2 (two) times daily between meals. Patient taking differently: Place 1 packet into feeding tube daily. 04/13/23  Yes Danford, Lonni SQUIBB, MD  Nutritional Supplements (FEEDING SUPPLEMENT, GLUCERNA 1.5 CAL,) LIQD Place 65 mL/hr into feeding tube continuous. Total 1541mL/24 hours.   Yes [provider]  oxyCODONE -acetaminophen  (PERCOCET/ROXICET) 5-325 MG tablet Place 1 tablet into feeding tube every 8 (eight) hours. PRN order: take 1 tablet per tube every 12 hours as needed for muscle pain   Yes [provider]  potassium chloride  20 MEQ/15ML (10%) SOLN Place 7.5 mLs (10 mEq total) into feeding tube daily. 02/05/23  Yes Danford, Lonni SQUIBB, MD  propranolol  (INDERAL ) 20 MG tablet Place 20 mg into feeding tube 2 (two) times daily.   Yes [provider]  sertraline  (ZOLOFT ) 20 MG/ML concentrated solution Place 5 mLs (100 mg total) into feeding tube daily. 02/05/23  Yes Danford, Lonni SQUIBB, MD  sodium hypochlorite (DAKIN'S 1/2 STRENGTH) external solution Apply 1 Application topically daily. Sacral ulcer   Yes [provider]  thiamine  (VITAMIN B-1) 100 MG tablet Place 1 tablet (100 mg total) into feeding tube daily. 02/05/23  Yes Danford, Lonni SQUIBB, MD  Water  For Irrigation, Sterile (FREE WATER ) SOLN Place 200 mLs into feeding tube every 4 (four) hours. Patient taking differently: Place 300 mLs into feeding tube every 4 (four) hours. 04/13/23  Yes Danford,  Lonni SQUIBB, MD  acetaminophen  (TYLENOL ) 325 MG tablet Place 650 mg into feeding tube 3 (three) times daily as needed for fever or mild pain (pain score 1-3). PRN order: take 650 mg per tube every 8 hours as needed for pain/fever Patient not taking: Reported on 05/13/2023    [provider]  cefTRIAXone  (ROCEPHIN ) 1 g injection Inject 1 g into the muscle once. One time only after lab work has been drawn. Patient not taking: Reported on 05/13/2023 05/08/23   [provider]  Emollient (EUCERIN INTENSIVE REPAIR EX) Apply 1 application  topically See admin instructions. Apply to face and body in the morning and at bedtime Patient not taking: Reported on 05/13/2023    [provider]  furosemide (LASIX) 20 MG tablet Place 40 mg into feeding tube once. One time only after labs have been drawn. Patient not taking: Reported on 05/13/2023 05/08/23   [provider]  methocarbamol  (ROBAXIN ) 500 MG tablet Place 500 mg into feeding tube every 6 (six) hours as needed for muscle spasms. Patient not taking: Reported on 05/13/2023    [provider]  polyethylene glycol powder (GLYCOLAX /MIRALAX ) 17 GM/SCOOP powder Place 17 g into feeding tube daily. Patient not taking: Reported on 05/13/2023 02/05/23   Jonel Lonni SQUIBB, MD  sodium chloride  0.9 % infusion Inject 2,000 mLs into the vein once. Patient not taking: Reported on 05/13/2023 05/05/23   [provider]     Critical care time: I have spent a total of 41 minutes of critical care time today managing the patient's severe sepsis and acute hypoxic respiratory failure requiring endotracheal intubation in the ED independent of time  spent during procedures caring for the patient, discussing plan of care with staff at bedside, contacting the patient's family to discuss goals of care, and reviewing the patient's electronic medical record.  The patient's prognosis is poor.  His risk for further complications, clinical  deterioration, and death given his critical illness and comorbid medical conditions is quite high.    Tonnie DRAFTS Noreen, M.D. [facility] 7:09 PM 05/13/23   Please see Amion for pager details.  From 7A-7P if no response please call [phone] After hours, please call ELink [phone]

## 2023-05-13 NOTE — ED Notes (Signed)
 Dr. Rhae Hammock at bedside for intubation

## 2023-05-13 NOTE — Progress Notes (Addendum)
 Pharmacy Antibiotic Note  [name] is a 43 y.o. male admitted on 05/13/2023 with sepsis.  Also, X-ray does show findings concerning for pneumonia. Admitted last month for sacral osteomyelitis.  Pharmacy has been consulted for Zosyn  and vancomycin  dosing.  In ED, patient received cefepime /Flagyl  x 1 each and vancomycin  1000 mg x 1.  Plan: Zosyn  3.375g IV q8h (4 hour infusion). Continue vancomycin  1750 mg IV q12h (Goal AUC 400-550, eAUC 505.3, SCr used: rounded up to 0.8) Monitor clinical progress, renal function, vancomycin  levels as indicated F/U daily SCr x 3, C&S, abx deescalation / LOT   Height: 5' 10 (177.8 cm) Weight: 89.5 kg (197 lb 5 oz) IBW/kg (Calculated) : 73  Temp (24hrs), Avg:102 F (38.9 C), Min:97.2 F (36.2 C), Max:103 F (39.4 C)  Recent Labs  Lab 05/13/23 1636 05/13/23 1857  WBC 12.4*  --   CREATININE 0.49*  --   LATICACIDVEN  --  2.1*    Estimated Creatinine Clearance: 135.4 mL/min (A) (by C-G formula based on SCr of 0.49 mg/dL (L)).    No Known Allergies    Thank you for allowing pharmacy to be a part of this patient's care.  Eleanor Agent, PharmD, BCPS 05/13/2023 8:15 PM

## 2023-05-13 NOTE — ED Notes (Signed)
 ED TO INPATIENT HANDOFF REPORT  ED Nurse Name and Phone #: Darryle RAMAN Name/Age/Gender [name] 43 y.o. male Room/Bed: WA02/WA02  Code Status   Code Status: Full Code  Home/SNF/Other Hospice questionable Patient oriented to: intubated, sedated Is this baseline? No   Triage Complete: Triage complete  Chief Complaint Severe sepsis (HCC) [A41.9, R65.20]  Triage Note PT BIBA from [facility] for change in mental status.  EMS states pt is no longer tracking with his eyes or able to communicate.  Family recently revoked hospice care, patient is a full code.       Allergies No Known Allergies  Level of Care/Admitting Diagnosis ED Disposition     ED Disposition  Admit   Condition  --   Comment  Hospital Area: [facility] [[id number]]  Level of Care: ICU [6]  May admit patient to [facility] or [facility] Law if equivalent level of care is available:: Yes  Covid Evaluation: Confirmed COVID Negative  Diagnosis: Severe sepsis [facility]) [[id number]]  Admitting Physician: NOREEN TONNIE BRAVO [[id number]]  Attending Physician: NOREEN TONNIE BRAVO [[id number]]  Certification:: I certify this patient will need inpatient services for at least 2 midnights  Expected Medical Readiness: 05/13/2023          B Medical/Surgery History Past Medical History:  Diagnosis Date   Acute respiratory failure with hypoxia (HCC) 02/01/2023   Cognitive communication deficit    Depression    Diabetes mellitus without complication (HCC)    Dysarthria and anarthria    Eye abnormalities    unspecified by pt   Hypertension    Multiple sclerosis (HCC)    Paraplegia (HCC)    Severe sepsis due to aspiration pneumonia (HCC) 01/31/2023   Weakness    generalized   Past Surgical History:  Procedure Laterality Date   LAPAROSCOPIC INSERTION GASTROSTOMY TUBE N/A 02/02/2023   Procedure: LAPAROSCOPIC INSERTION GASTROSTOMY TUBE;  Surgeon: Stevie Herlene Righter, MD;  Location: [facility] ORS;  Service:  General;  Laterality: N/A;   RADIOLOGY WITH ANESTHESIA N/A 11/11/2021   Procedure: MRI WITH ANESTHESIA BRAIN MRI WITH T SPINE AND CERVICAL SPINE W/O CONTRAST;  Surgeon: Radiologist, Medication, MD;  Location: [facility] OR;  Service: Radiology;  Laterality: N/A;   WISDOM TOOTH EXTRACTION       A IV Location/Drains/Wounds Patient Lines/Drains/Airways Status     Active Line/Drains/Airways     Name Placement date Placement time Site Days   Peripheral IV 05/13/23 20 G Anterior;Right Forearm 05/13/23  1810  Forearm  less than 1   PICC Single Lumen Left --  --  --  --   CVC Single Lumen 04/09/23 Left 04/09/23  1645  --  34   Gastrostomy/Enterostomy PEG-jejunostomy LUQ 02/02/23  1313  LUQ  100   Airway 7.5 mm 05/13/23  0620  -- less than 1   Incision - 3 Ports Abdomen 1: Left;Mid 2: Right;Mid 3: Right;Upper 02/02/23  1305  -- 100   Pressure Injury 04/09/23 Heel Lower;Right Deep Tissue Pressure Injury - Purple or maroon localized area of discolored intact skin or blood-filled blister due to damage of underlying soft tissue from pressure and/or shear. Right heel 04/09/23  0800  -- 34   Wound / Incision (Open or Dehisced) 04/09/23 Other (Comment) Sacrum Mid Presure Injury stage 4 04/09/23  1745  Sacrum  34            Intake/Output Last 24 hours  Intake/Output Summary (Last 24 hours) at 05/13/2023 2018 Last data filed at  05/13/2023 2001 Gross per 24 hour  Intake 180.9 ml  Output --  Net 180.9 ml    Labs/Imaging Results for orders placed or performed during the hospital encounter of 05/13/23 (from the past 48 hours)  Comprehensive metabolic panel     Status: Abnormal   Collection Time: 05/13/23  4:36 PM  Result Value Ref Range   Sodium 143 135 - 145 mmol/L   Potassium 4.3 3.5 - 5.1 mmol/L   Chloride 104 98 - 111 mmol/L   CO2 29 22 - 32 mmol/L   Glucose, Bld 399 (H) 70 - 99 mg/dL    Comment: Glucose reference range applies only to samples taken after fasting for at least 8 hours.   BUN 34  (H) 6 - 20 mg/dL   Creatinine, Ser 9.50 (L) 0.61 - 1.24 mg/dL   Calcium 9.0 8.9 - 89.6 mg/dL   Total Protein 8.7 (H) 6.5 - 8.1 g/dL   Albumin 2.5 (L) 3.5 - 5.0 g/dL   AST 94 (H) 15 - 41 U/L   ALT 113 (H) 0 - 44 U/L   Alkaline Phosphatase 82 38 - 126 U/L   Total Bilirubin 0.3 0.0 - 1.2 mg/dL   GFR, Estimated >39 >39 mL/min    Comment: (NOTE) Calculated using the CKD-EPI Creatinine Equation (2021)    Anion gap 10 5 - 15    Comment: Performed at [facility], [address], [place], [place] [address]  CBC with Differential     Status: Abnormal   Collection Time: 05/13/23  4:36 PM  Result Value Ref Range   WBC 12.4 (H) 4.0 - 10.5 K/uL   RBC 4.12 (L) 4.22 - 5.81 MIL/uL   Hemoglobin 11.2 (L) 13.0 - 17.0 g/dL   HCT 63.9 (L) 60.9 - 47.9 %   MCV 87.4 80.0 - 100.0 fL   MCH 27.2 26.0 - 34.0 pg   MCHC 31.1 30.0 - 36.0 g/dL   RDW 84.0 (H) 88.4 - 84.4 %   Platelets 550 (H) 150 - 400 K/uL   nRBC 0.0 0.0 - 0.2 %   Neutrophils Relative % 85 %   Neutro Abs 10.5 (H) 1.7 - 7.7 K/uL   Lymphocytes Relative 7 %   Lymphs Abs 0.9 0.7 - 4.0 K/uL   Monocytes Relative 7 %   Monocytes Absolute 0.9 0.1 - 1.0 K/uL   Eosinophils Relative 0 %   Eosinophils Absolute 0.0 0.0 - 0.5 K/uL   Basophils Relative 0 %   Basophils Absolute 0.1 0.0 - 0.1 K/uL   Immature Granulocytes 1 %   Abs Immature Granulocytes 0.10 (H) 0.00 - 0.07 K/uL    Comment: Performed at [facility], [address], [place], [place] [address]  Protime-INR     Status: None   Collection Time: 05/13/23  4:36 PM  Result Value Ref Range   Prothrombin Time 14.7 11.4 - 15.2 seconds   INR 1.1 0.8 - 1.2    Comment: (NOTE) INR goal varies based on device and disease states. Performed at [facility], [address] [place], [place] [address]   APTT     Status: None   Collection Time: 05/13/23  4:36 PM  Result Value Ref Range   aPTT 29 24 - 36 seconds    Comment: Performed at  [facility], [address], [place], [place] [address]  Resp panel by RT-PCR (RSV, Flu A&B, Covid) Anterior Nasal Swab     Status: None  Collection Time: 05/13/23  4:37 PM   Specimen: Anterior Nasal Swab  Result Value Ref Range   SARS Coronavirus 2 by RT PCR NEGATIVE NEGATIVE    Comment: (NOTE) SARS-CoV-2 target nucleic acids are NOT DETECTED.  The SARS-CoV-2 RNA is generally detectable in upper respiratory specimens during the acute phase of infection. The lowest concentration of SARS-CoV-2 viral copies this assay can detect is 138 copies/mL. A negative result does not preclude SARS-Cov-2 infection and should not be used as the sole basis for treatment or other patient management decisions. A negative result may occur with  improper specimen collection/handling, submission of specimen other than nasopharyngeal swab, presence of viral mutation(s) within the areas targeted by this assay, and inadequate number of viral copies(<138 copies/mL). A negative result must be combined with clinical observations, patient history, and epidemiological information. The expected result is Negative.  Fact Sheet for Patients:  bloggercourse.com  Fact Sheet for Healthcare Providers:  seriousbroker.it  This test is no t yet approved or cleared by the [place]  FDA and  has been authorized for detection and/or diagnosis of SARS-CoV-2 by FDA under an Emergency Use Authorization (EUA). This EUA will remain  in effect (meaning this test can be used) for the duration of the COVID-19 declaration under Section 564(b)(1) of the Act, 21 U.S.C.section 360bbb-3(b)(1), unless the authorization is terminated  or revoked sooner.       Influenza A by PCR NEGATIVE NEGATIVE   Influenza B by PCR NEGATIVE NEGATIVE    Comment: (NOTE) The Xpert Xpress SARS-CoV-2/FLU/RSV plus assay is intended as an aid in the diagnosis of influenza from  Nasopharyngeal swab specimens and should not be used as a sole basis for treatment. Nasal washings and aspirates are unacceptable for Xpert Xpress SARS-CoV-2/FLU/RSV testing.  Fact Sheet for Patients: bloggercourse.com  Fact Sheet for Healthcare Providers: seriousbroker.it  This test is not yet approved or cleared by the [place]  FDA and has been authorized for detection and/or diagnosis of SARS-CoV-2 by FDA under an Emergency Use Authorization (EUA). This EUA will remain in effect (meaning this test can be used) for the duration of the COVID-19 declaration under Section 564(b)(1) of the Act, 21 U.S.C. section 360bbb-3(b)(1), unless the authorization is terminated or revoked.     Resp Syncytial Virus by PCR NEGATIVE NEGATIVE    Comment: (NOTE) Fact Sheet for Patients: bloggercourse.com  Fact Sheet for Healthcare Providers: seriousbroker.it  This test is not yet approved or cleared by the [place]  FDA and has been authorized for detection and/or diagnosis of SARS-CoV-2 by FDA under an Emergency Use Authorization (EUA). This EUA will remain in effect (meaning this test can be used) for the duration of the COVID-19 declaration under Section 564(b)(1) of the Act, 21 U.S.C. section 360bbb-3(b)(1), unless the authorization is terminated or revoked.  Performed at [facility], [address], [place], [place] [address]   Urinalysis, w/ Reflex to Culture (Infection Suspected) -Urine, Catheterized; Indwelling urinary catheter     Status: Abnormal   Collection Time: 05/13/23  5:09 PM  Result Value Ref Range   Specimen Source URINE, CATHETERIZED    Color, Urine YELLOW YELLOW   APPearance HAZY (A) CLEAR   Specific Gravity, Urine 1.039 (H) 1.005 - 1.030   pH 5.0 5.0 - 8.0   Glucose, UA >=500 (A) NEGATIVE mg/dL   Hgb urine dipstick SMALL (A) NEGATIVE    Bilirubin Urine NEGATIVE NEGATIVE   Ketones, ur NEGATIVE NEGATIVE mg/dL   Protein, ur 899 (A) NEGATIVE  mg/dL   Nitrite NEGATIVE NEGATIVE   Leukocytes,Ua SMALL (A) NEGATIVE   RBC / HPF >50 0 - 5 RBC/hpf   WBC, UA >50 0 - 5 WBC/hpf    Comment:        Reflex urine culture not performed if WBC <=10, OR if Squamous epithelial cells >5. If Squamous epithelial cells >5 suggest recollection.    Bacteria, UA RARE (A) NONE SEEN   Squamous Epithelial / HPF 0-5 0 - 5 /HPF    Comment: Performed at [facility], [address], [place], [place] [address]  I-Stat Lactic Acid, ED     Status: Abnormal   Collection Time: 05/13/23  6:57 PM  Result Value Ref Range   Lactic Acid, Venous 2.1 (HH) 0.5 - 1.9 mmol/L   Comment NOTIFIED PHYSICIAN   Blood gas, arterial     Status: Abnormal   Collection Time: 05/13/23  7:33 PM  Result Value Ref Range   FIO2 100 %   Delivery systems VENTILATOR    Mode PRESSURE REGULATED VOLUME CONTROL    MECHVT 580 mL   RATE 18 resp/min   PEEP 8 cm H20   pH, Arterial 7.43 7.35 - 7.45   pCO2 arterial 51 (H) 32 - 48 mmHg   pO2, Arterial 81 (L) 83 - 108 mmHg   Bicarbonate 32.7 (H) 20.0 - 28.0 mmol/L   Acid-Base Excess 7.7 (H) 0.0 - 2.0 mmol/L   O2 Saturation 95.1 %   Patient temperature 39.2    Collection site RIGHT RADIAL    Drawn by [id number]    Allens test (pass/fail) PASS PASS    Comment: Performed at [facility], [address] [place], [place] [address]   DG Chest Portable 1 View Result Date: 05/13/2023 CLINICAL DATA:  Intubation EXAM: PORTABLE CHEST 1 VIEW COMPARISON:  05/13/2023 FINDINGS: Endotracheal tube terminates 5 cm above the carina. Patchy right lower lung opacity, suspicious for pneumonia. Possible small right pleural effusion. Left lung is clear. No pneumothorax. Heart is normal in size. Enteric tube looped in the gastric cardia. IMPRESSION: Endotracheal tube terminates 5 cm above the carina. Patchy right lower lung  opacity, suspicious for pneumonia. Possible small right pleural effusion. Enteric tube looped in the gastric cardia. Electronically Signed   By: Pinkie Pebbles M.D.   On: 05/13/2023 19:01   DG Chest Port 1 View Result Date: 05/13/2023 CLINICAL DATA:  Questionable sepsis - evaluate for abnormality EXAM: PORTABLE CHEST - 1 VIEW COMPARISON:  04/09/2023 FINDINGS: New airspace opacity at the right lung base.  Left lung clear. Heart size and mediastinal contours are within normal limits. No effusion. Visualized bones unremarkable. IMPRESSION: New right basilar airspace disease. Electronically Signed   By: JONETTA Faes M.D.   On: 05/13/2023 17:05    Pending Labs Unresulted Labs (From admission, onward)     Start     Ordered   05/14/23 0500  Comprehensive metabolic panel  Tomorrow morning,   R        05/13/23 1957   05/14/23 0500  CBC with Differential/Platelet  Tomorrow morning,   R        05/13/23 1957   05/14/23 0500  Magnesium   Tomorrow morning,   R        05/13/23 1957   05/14/23 0500  Phosphorus  Tomorrow morning,   R        05/13/23 1957   05/14/23 0500  Triglycerides  (propofol  (DIPRIVAN ))  Every 72 hours,   R  Comments: While on propofol  (DIPRIVAN )    05/13/23 2003   05/13/23 2009  CK  ONCE - URGENT,   URGENT        05/13/23 2009   05/13/23 2007  Amylase  Once,   R        05/13/23 2008   05/13/23 2007  Lipase, blood  Once,   R        05/13/23 2008   05/13/23 2007  Phosphorus  Once,   R        05/13/23 2008   05/13/23 2007  Magnesium   Once,   R        05/13/23 2008   05/13/23 2006  Procalcitonin  Once,   R       References:    Procalcitonin Lower Respiratory Tract Infection AND Sepsis Procalcitonin Algorithm   05/13/23 2008   05/13/23 2001  Hemoglobin A1c  Once,   URGENT       Comments: To assess prior glycemic control    05/13/23 2000   05/13/23 1959  Legionella Pneumophila Serogp 1 Ur Ag  ONCE - URGENT,   URGENT        05/13/23 1958   05/13/23 1959  Strep pneumoniae  urinary antigen  ONCE - URGENT,   URGENT        05/13/23 1958   05/13/23 1958  Culture, Respiratory w Gram Stain  ONCE - URGENT,   URGENT        05/13/23 1958   05/13/23 1955  Lactic acid, plasma  ONCE - STAT,   STAT        05/13/23 1954   05/13/23 1709  Urine Culture  Once,   R        05/13/23 1709   05/13/23 1621  Blood Culture (routine x 2)  (Septic presentation on arrival (screening labs, nursing and treatment orders for obvious sepsis))  BLOOD CULTURE X 2,   STAT      05/13/23 1621            Vitals/Pain Today's Vitals   05/13/23 1955 05/13/23 2000 05/13/23 2005 05/13/23 2010  BP:  110/79 112/83 104/82  Pulse:  (!) 113 (!) 113 (!) 112  Resp:  18 18 18   Temp:  (!) 102.9 F (39.4 C) (!) 102.9 F (39.4 C) (!) 102.9 F (39.4 C)  SpO2:  96% 97% 98%  Weight:      Height:      PainSc: Asleep       Isolation Precautions No active isolations  Medications Medications  acetaminophen  (OFIRMEV ) IV 1,000 mg (0 mg Intravenous Stopped 05/13/23 2001)  nitroGLYCERIN  0.2 mg/mL in dextrose  5 % infusion (  Not Given 05/13/23 1812)  succinylcholine  (ANECTINE ) 200 MG/10ML syringe (  Not Given 05/13/23 1822)  etomidate  (AMIDATE ) 2 MG/ML injection (  Not Given 05/13/23 1821)  rocuronium  (ZEMURON ) 100 MG/10ML injection (  Not Given 05/13/23 1821)  fentaNYL  in NS (36mcg/ml) infusion-PREMIX (50 mcg/hr Intravenous Rate/Dose Change 05/13/23 1911)  sodium chloride  flush (NS) 0.9 % injection 3-10 mL (has no administration in time range)  sodium chloride  flush (NS) 0.9 % injection 3-10 mL (has no administration in time range)  doxycycline  (VIBRAMYCIN ) 100 mg in dextrose  5 % 250 mL IVPB (has no administration in time range)  insulin  aspart (novoLOG ) injection 0-6 Units (has no administration in time range)  famotidine  (PEPCID ) tablet 20 mg (has no administration in time range)  Oral care mouth rinse (has no administration in time  range)  Oral care mouth rinse (has no administration in  time range)  docusate (COLACE) 50 MG/5ML liquid 100 mg (has no administration in time range)  fentaNYL  (SUBLIMAZE ) injection 50 mcg (has no administration in time range)  propofol  (DIPRIVAN ) 1000 MG/100ML infusion (has no administration in time range)  acetaminophen  (TYLENOL ) 160 MG/5ML solution 650 mg (has no administration in time range)  ibuprofen  (ADVIL ) 100 MG/5ML suspension 400 mg (has no administration in time range)  ceFEPIme  (MAXIPIME ) 2 g in sodium chloride  0.9 % 100 mL IVPB (0 g Intravenous Stopped 05/13/23 1942)  metroNIDAZOLE  (FLAGYL ) IVPB 500 mg (0 mg Intravenous Stopped 05/13/23 1805)  vancomycin  (VANCOCIN ) IVPB 1000 mg/200 mL premix (0 mg Intravenous Stopped 05/13/23 1937)  etomidate  (AMIDATE ) injection 25 mg (25 mg Intravenous Given 05/13/23 1817)  succinylcholine  (ANECTINE ) syringe 120 mg (120 mg Intravenous Given 05/13/23 1819)    Mobility non-ambulatory     Focused Assessments     R Recommendations: See Admitting Provider Note  Report given to:   Additional Notes:

## 2023-05-13 NOTE — ED Notes (Signed)
 Pt successfully intubated by Dr. Rhae Hammock

## 2023-05-13 NOTE — ED Notes (Signed)
 Pt desat to 76%, attempted suction, increased ox to 4L  Contacted charge and MD

## 2023-05-14 DIAGNOSIS — R4182 Altered mental status, unspecified: Secondary | ICD-10-CM

## 2023-05-14 LAB — CBC WITH DIFFERENTIAL/PLATELET
Abs Immature Granulocytes: 0.07 10*3/uL (ref 0.00–0.07)
Basophils Absolute: 0.1 10*3/uL (ref 0.0–0.1)
Basophils Relative: 0 %
Eosinophils Absolute: 0 10*3/uL (ref 0.0–0.5)
Eosinophils Relative: 0 %
HCT: 35 % — ABNORMAL LOW (ref 39.0–52.0)
Hemoglobin: 10.4 g/dL — ABNORMAL LOW (ref 13.0–17.0)
Immature Granulocytes: 1 %
Lymphocytes Relative: 9 %
Lymphs Abs: 1.1 10*3/uL (ref 0.7–4.0)
MCH: 26.6 pg (ref 26.0–34.0)
MCHC: 29.7 g/dL — ABNORMAL LOW (ref 30.0–36.0)
MCV: 89.5 fL (ref 80.0–100.0)
Monocytes Absolute: 0.9 10*3/uL (ref 0.1–1.0)
Monocytes Relative: 7 %
Neutro Abs: 10.6 10*3/uL — ABNORMAL HIGH (ref 1.7–7.7)
Neutrophils Relative %: 83 %
Platelets: 499 10*3/uL — ABNORMAL HIGH (ref 150–400)
RBC: 3.91 MIL/uL — ABNORMAL LOW (ref 4.22–5.81)
RDW: 15.8 % — ABNORMAL HIGH (ref 11.5–15.5)
WBC: 12.8 10*3/uL — ABNORMAL HIGH (ref 4.0–10.5)
nRBC: 0 % (ref 0.0–0.2)

## 2023-05-14 LAB — GLUCOSE, CAPILLARY
Glucose-Capillary: 231 mg/dL — ABNORMAL HIGH (ref 70–99)
Glucose-Capillary: 264 mg/dL — ABNORMAL HIGH (ref 70–99)
Glucose-Capillary: 279 mg/dL — ABNORMAL HIGH (ref 70–99)
Glucose-Capillary: 283 mg/dL — ABNORMAL HIGH (ref 70–99)
Glucose-Capillary: 332 mg/dL — ABNORMAL HIGH (ref 70–99)
Glucose-Capillary: 348 mg/dL — ABNORMAL HIGH (ref 70–99)

## 2023-05-14 LAB — COMPREHENSIVE METABOLIC PANEL
ALT: 87 U/L — ABNORMAL HIGH (ref 0–44)
AST: 54 U/L — ABNORMAL HIGH (ref 15–41)
Albumin: 2.2 g/dL — ABNORMAL LOW (ref 3.5–5.0)
Alkaline Phosphatase: 74 U/L (ref 38–126)
Anion gap: 13 (ref 5–15)
BUN: 40 mg/dL — ABNORMAL HIGH (ref 6–20)
CO2: 29 mmol/L (ref 22–32)
Calcium: 8.6 mg/dL — ABNORMAL LOW (ref 8.9–10.3)
Chloride: 103 mmol/L (ref 98–111)
Creatinine, Ser: 0.7 mg/dL (ref 0.61–1.24)
GFR, Estimated: >60 mL/min (ref >60)
Glucose, Bld: 383 mg/dL — ABNORMAL HIGH (ref 70–99)
Potassium: 4.1 mmol/L (ref 3.5–5.1)
Sodium: 145 mmol/L (ref 135–145)
Total Bilirubin: 0.4 mg/dL (ref 0.0–1.2)
Total Protein: 7.7 g/dL (ref 6.5–8.1)

## 2023-05-14 LAB — MAGNESIUM
Magnesium: 2.1 mg/dL (ref 1.7–2.4)
Magnesium: 2.3 mg/dL (ref 1.7–2.4)
Magnesium: 2.3 mg/dL (ref 1.7–2.4)

## 2023-05-14 LAB — PHOSPHORUS
Phosphorus: 3.9 mg/dL (ref 2.5–4.6)
Phosphorus: 3.6 mg/dL (ref 2.5–4.6)
Phosphorus: 3 mg/dL (ref 2.5–4.6)

## 2023-05-14 LAB — HEPATITIS PANEL, ACUTE
HCV Ab: NONREACTIVE
Hep A IgM: NONREACTIVE
Hep B C IgM: NONREACTIVE
Hepatitis B Surface Ag: NONREACTIVE

## 2023-05-14 LAB — STREP PNEUMONIAE URINARY ANTIGEN: Strep Pneumo Urinary Antigen: NEGATIVE

## 2023-05-14 LAB — TRIGLYCERIDES: Triglycerides: 259 mg/dL — ABNORMAL HIGH (ref <150)

## 2023-05-14 LAB — VITAMIN B12: Vitamin B-12: 1056 pg/mL — ABNORMAL HIGH (ref 180–914)

## 2023-05-14 LAB — T4, FREE: Free T4: 1.16 ng/dL — ABNORMAL HIGH (ref 0.61–1.12)

## 2023-05-14 MED ORDER — SODIUM CHLORIDE 3 % IN NEBU
4.0000 mL | INHALATION_SOLUTION | Freq: Four times a day (QID) | RESPIRATORY_TRACT | Status: AC
  Administered 2023-05-14 – 2023-05-16 (×11): 4 mL via RESPIRATORY_TRACT
  Filled 2023-05-14 (×12): qty 4

## 2023-05-14 MED ORDER — FENTANYL CITRATE PF 50 MCG/ML IJ SOSY: 50.0000 ug | PREFILLED_SYRINGE | Freq: Once | INTRAMUSCULAR | Status: DC

## 2023-05-14 MED ORDER — INSULIN GLARGINE-YFGN 100 UNIT/ML ~~LOC~~ SOLN
10.0000 [IU] | Freq: Two times a day (BID) | SUBCUTANEOUS | Status: DC
  Administered 2023-05-14 – 2023-05-15 (×3): 10 [IU] via SUBCUTANEOUS
  Filled 2023-05-14 (×4): qty 0.1

## 2023-05-14 MED ORDER — LACTATED RINGERS IV BOLUS
1000.0000 mL | Freq: Once | INTRAVENOUS | Status: AC
  Administered 2023-05-14: 1000 mL via INTRAVENOUS

## 2023-05-14 MED ORDER — PIVOT 1.5 CAL PO LIQD
1000.0000 mL | ORAL | Status: DC
  Administered 2023-05-14: 1000 mL
  Filled 2023-05-14: qty 1000

## 2023-05-14 MED ORDER — ENOXAPARIN SODIUM 60 MG/0.6ML IJ SOSY
60.0000 mg | PREFILLED_SYRINGE | Freq: Two times a day (BID) | INTRAMUSCULAR | Status: DC
  Administered 2023-05-14 – 2023-05-21 (×15): 60 mg via SUBCUTANEOUS
  Filled 2023-05-14 (×15): qty 0.6

## 2023-05-14 MED ORDER — FENTANYL BOLUS VIA INFUSION
50.0000 ug | INTRAVENOUS | Status: DC | PRN
  Administered 2023-05-14: 75 ug via INTRAVENOUS
  Administered 2023-05-14: 50 ug via INTRAVENOUS
  Administered 2023-05-14: 75 ug via INTRAVENOUS
  Administered 2023-05-15 – 2023-05-17 (×11): 50 ug via INTRAVENOUS
  Administered 2023-05-17: 75 ug via INTRAVENOUS
  Administered 2023-05-17: 50 ug via INTRAVENOUS
  Administered 2023-05-18: 75 ug via INTRAVENOUS
  Administered 2023-05-18: 50 ug via INTRAVENOUS

## 2023-05-14 MED ORDER — ALBUTEROL SULFATE (2.5 MG/3ML) 0.083% IN NEBU
2.5000 mg | INHALATION_SOLUTION | Freq: Four times a day (QID) | RESPIRATORY_TRACT | Status: AC
  Administered 2023-05-14 – 2023-05-16 (×12): 2.5 mg via RESPIRATORY_TRACT
  Filled 2023-05-14 (×12): qty 3

## 2023-05-14 MED ORDER — PROSOURCE TF20 ENFIT COMPATIBL EN LIQD
60.0000 mL | Freq: Every day | ENTERAL | Status: DC
  Administered 2023-05-14: 60 mL
  Filled 2023-05-14: qty 60

## 2023-05-14 MED ORDER — PROPOFOL 1000 MG/100ML IV EMUL
0.0000 ug/kg/min | INTRAVENOUS | Status: DC
  Administered 2023-05-14 – 2023-05-16 (×6): 35 ug/kg/min via INTRAVENOUS
  Administered 2023-05-17: 5 ug/kg/min via INTRAVENOUS
  Filled 2023-05-14 (×9): qty 100

## 2023-05-14 MED ORDER — VITAL 1.5 CAL PO LIQD
1000.0000 mL | ORAL | Status: DC
  Administered 2023-05-14 – 2023-05-23 (×7): 1000 mL
  Filled 2023-05-14 (×14): qty 1000

## 2023-05-14 MED ORDER — SODIUM CHLORIDE 0.9 % IV SOLN: INTRAVENOUS | Status: DC | PRN

## 2023-05-14 MED ORDER — PROPOFOL 1000 MG/100ML IV EMUL
0.0000 ug/kg/min | INTRAVENOUS | Status: DC
  Administered 2023-05-14: 35 ug/kg/min via INTRAVENOUS
  Administered 2023-05-14: 25 ug/kg/min via INTRAVENOUS
  Filled 2023-05-14: qty 100

## 2023-05-14 MED ORDER — VANCOMYCIN HCL IN DEXTROSE 1-5 GM/200ML-% IV SOLN
1000.0000 mg | Freq: Two times a day (BID) | INTRAVENOUS | Status: DC
  Administered 2023-05-14 – 2023-05-15 (×3): 1000 mg via INTRAVENOUS
  Filled 2023-05-14 (×3): qty 200

## 2023-05-14 MED ORDER — PROSOURCE TF20 ENFIT COMPATIBL EN LIQD
60.0000 mL | Freq: Two times a day (BID) | ENTERAL | Status: DC
  Administered 2023-05-15 – 2023-05-24 (×18): 60 mL
  Filled 2023-05-14 (×18): qty 60

## 2023-05-14 MED ORDER — INSULIN ASPART 100 UNIT/ML IJ SOLN
0.0000 [IU] | INTRAMUSCULAR | Status: DC
  Administered 2023-05-14: 3 [IU] via SUBCUTANEOUS
  Administered 2023-05-14 (×2): 5 [IU] via SUBCUTANEOUS
  Administered 2023-05-15: 3 [IU] via SUBCUTANEOUS
  Administered 2023-05-15 (×4): 5 [IU] via SUBCUTANEOUS
  Administered 2023-05-15: 7 [IU] via SUBCUTANEOUS
  Administered 2023-05-15: 5 [IU] via SUBCUTANEOUS
  Administered 2023-05-16: 7 [IU] via SUBCUTANEOUS
  Administered 2023-05-16: 5 [IU] via SUBCUTANEOUS
  Administered 2023-05-16: 3 [IU] via SUBCUTANEOUS
  Administered 2023-05-16 (×3): 5 [IU] via SUBCUTANEOUS
  Administered 2023-05-17 (×3): 3 [IU] via SUBCUTANEOUS
  Administered 2023-05-17: 5 [IU] via SUBCUTANEOUS
  Administered 2023-05-17: 3 [IU] via SUBCUTANEOUS
  Administered 2023-05-18: 2 [IU] via SUBCUTANEOUS
  Administered 2023-05-18 (×3): 3 [IU] via SUBCUTANEOUS
  Administered 2023-05-18: 2 [IU] via SUBCUTANEOUS
  Administered 2023-05-18: 1 [IU] via SUBCUTANEOUS
  Administered 2023-05-19: 3 [IU] via SUBCUTANEOUS
  Administered 2023-05-19: 2 [IU] via SUBCUTANEOUS
  Administered 2023-05-19: 3 [IU] via SUBCUTANEOUS
  Administered 2023-05-19: 1 [IU] via SUBCUTANEOUS
  Administered 2023-05-19: 2 [IU] via SUBCUTANEOUS
  Administered 2023-05-19: 3 [IU] via SUBCUTANEOUS
  Administered 2023-05-20 (×2): 2 [IU] via SUBCUTANEOUS
  Administered 2023-05-20: 1 [IU] via SUBCUTANEOUS
  Administered 2023-05-20 (×2): 2 [IU] via SUBCUTANEOUS
  Administered 2023-05-20: 3 [IU] via SUBCUTANEOUS
  Administered 2023-05-21: 1 [IU] via SUBCUTANEOUS
  Administered 2023-05-21 (×3): 2 [IU] via SUBCUTANEOUS
  Administered 2023-05-21 – 2023-05-22 (×2): 1 [IU] via SUBCUTANEOUS
  Administered 2023-05-22: 2 [IU] via SUBCUTANEOUS
  Administered 2023-05-23 (×3): 1 [IU] via SUBCUTANEOUS
  Administered 2023-05-23: 2 [IU] via SUBCUTANEOUS
  Administered 2023-05-24: 1 [IU] via SUBCUTANEOUS
  Administered 2023-05-24: 2 [IU] via SUBCUTANEOUS
  Administered 2023-05-24: 1 [IU] via SUBCUTANEOUS
  Administered 2023-05-24: 2 [IU] via SUBCUTANEOUS
  Administered 2023-05-24: 1 [IU] via SUBCUTANEOUS

## 2023-05-14 MED ORDER — FENTANYL 2500MCG IN NS 250ML (10MCG/ML) PREMIX INFUSION
50.0000 ug/h | INTRAVENOUS | Status: DC
  Administered 2023-05-14: 75 ug/h via INTRAVENOUS
  Administered 2023-05-16: 100 ug/h via INTRAVENOUS
  Administered 2023-05-18: 75 ug/h via INTRAVENOUS
  Filled 2023-05-14 (×3): qty 250

## 2023-05-14 NOTE — Progress Notes (Signed)
 Pharmacy Antibiotic Note  [name] is a 43 y.o. male admitted on 05/13/2023 with sepsis.  Also, X-ray does show findings concerning for pneumonia. Admitted last month for sacral osteomyelitis.  Pharmacy has been consulted for Zosyn  and vancomycin  dosing.  In ED, patient received cefepime /Flagyl  x 1 each and vancomycin  1000 mg x 1.  Plan: Zosyn  3.375g IV q8h (4 hour infusion). Continue vancomycin  1750 mg IV q12h (Goal AUC 400-550, eAUC 505.3, SCr used: rounded up to 0.8) Monitor clinical progress, renal function, vancomycin  levels as indicated F/U daily SCr x 3, C&S, abx deescalation / LOT   Height: 5' 10 (177.8 cm) Weight: 61.6 kg (135 lb 12.9 oz) IBW/kg (Calculated) : 73  Temp (24hrs), Avg:102 F (38.9 C), Min:97.2 F (36.2 C), Max:103 F (39.4 C)  Recent Labs  Lab 05/13/23 1636 05/13/23 1857 05/13/23 2303  WBC 12.4*  --   --   CREATININE 0.49*  --   --   LATICACIDVEN  --  2.1* 2.1*    Estimated Creatinine Clearance: 104.8 mL/min (A) (by C-G formula based on SCr of 0.49 mg/dL (L)).    No Known Allergies    Thank you for allowing pharmacy to be a part of this patient's care.  RN contacted pharmacy to inform that the pt's wt is significantly less than what was in the computer 89.5kg >>61.6kg  Based on new info change vancomcyin to 1gm IV q12h (AUC 493.4, Scr used 0.8, TBW -continue Zosyn  3.375g IV Q8H infused over 4hrs.   Leeroy Mace RPh 05/14/2023, 12:30 AM

## 2023-05-14 NOTE — Consult Note (Addendum)
 WOC Nurse Consult Note: Reason for Consult: Consult requested for sacrum and bilat heels. Pt is familiar to [facility] team from a recent admission on 12/10.  Pt is critically ill with multiple systemic factors which can impair healing.  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 70% red, 30% yellow/black slough Please re-consult if further assistance is needed.  [place] Stephane Fought MSN, RN, CWOCN, CWCN-AP, CNS [phone]  to inner wound.  10X7X3 cm with undermining to wound edges to 4 cm.  Large amt tan drainage with strong odor.  Left hip with Stage 2 pressure injury; .5X.5X.1cm, pink and moist Bilat knees with pink dry scar tissue and patchy areas of scabs to the edges from previous full thickness wounds which are almost healed. Bilat heels with pink dry scar tissue and patchy areas of scabs to the edges from previous Stage 3 pressure injuries which are almost healed. Pressure Injury POA: Yes Dressing procedure/placement/frequency: Pt is on a low airloss mattress to reduce pressure.  Prevalon boots ordered to reduce pressure to bilat heels. Topical treatment orders provided for bedside nurses to perform as follows: 1. Apply moist kerlex gauze to sacrum wound Q day, using swab to fill, then cover with foam dressing.  Change foam dressing Q 3 days or PRN soiling. 2. Foam dressing to left hip, bilat knees, bilat heels, change Q 3 days or PRN soiling Please re-consult if further assistance is needed.  Thank-you,  Stephane Fought MSN, RN, CWOCN, [place], CNS [phone]

## 2023-05-14 NOTE — Progress Notes (Signed)
 eLink Physician-Brief Progress Note Patient Name: [name] DOB: [birth year] MRN: [id number]   Date of Service  05/14/2023  HPI/Events of Note  Received request for Foley cath order Foley already inserted from the ED  eICU Interventions  Foley cath ordered Bedside rounding team to reassess when foley can be appropriately removed     Intervention Category Intermediate Interventions: Other:  Damien ONEIDA Grout 05/14/2023, 3:47 AM

## 2023-05-14 NOTE — Progress Notes (Signed)
 Sputum collected and send down to lab.

## 2023-05-14 NOTE — TOC Initial Note (Signed)
 Transition of Care [facility]) - Initial/Assessment Note    Patient Details  Name: [name] MRN: [id number] Date of Birth: Apr 06, 1981  [facility] CM/SW Contact:    Tawni CHRISTELLA Eva, LCSW Phone Number: 05/14/2023, 2:46 PM  Clinical Narrative:                 Pt is currently intubated. Pt is a long tem care resident with [facility] and can return after hospitalization. TOC to follow for d/c needs.  Expected Discharge Plan: Long Term Nursing Home Barriers to Discharge: Continued Medical Work up   Patient Goals and CMS Choice            Expected Discharge Plan and Services                                              Prior Living Arrangements/Services                       Activities of Daily Living      Permission Sought/Granted                  Emotional Assessment              Admission diagnosis:  Severe sepsis (HCC) [A41.9, R65.20] Pneumonia due to infectious organism, unspecified laterality, unspecified part of lung [J18.9] Sepsis, due to unspecified organism, unspecified whether acute organ dysfunction present [facility]) [A41.9] Patient Active Problem List   Diagnosis Date Noted   Severe sepsis (HCC) 05/13/2023   Hypomagnesemia 04/11/2023   Normocytic anemia 04/10/2023   Sacral decubitus ulcer, stage IV (HCC) 04/10/2023   Sepsis due to osteomyelitis of the coccyx and sacrum (HCC) 04/09/2023   Malnutrition of moderate degree 02/02/2023   History of pulmonary embolus (PE) 02/01/2023   Hypernatremia 02/01/2023   Hypokalemia 02/01/2023   Dysphagia 02/01/2023   Muscle spasms of neck    Neuropathic pain    Vitamin B12 deficiency    Hypoalbuminemia    Urinary frequency    Tobacco abuse    Steroid-induced hyperglycemia    Marijuana abuse, continuous 04/12/2017   Vitamin D  deficiency 04/12/2017   Multiple sclerosis pseudoexacerbation (HCC)    Abnormality of gait 05/10/2016   Hypertension    Depression     Multiple sclerosis (HCC) 03/02/2011   PCP:  Montey Lot, PA-C Pharmacy:  No Pharmacies Listed    Social Drivers of Health (SDOH) Social History: SDOH Screenings   Food Insecurity: No Food Insecurity (04/09/2023)  Housing: Low Risk  (04/09/2023)  Transportation Needs: No Transportation Needs (04/09/2023)  Utilities: Not At Risk (04/09/2023)  Tobacco Use: High Risk (05/13/2023)   SDOH Interventions:     Readmission Risk Interventions     No data to display

## 2023-05-14 NOTE — Progress Notes (Signed)
 eLink Physician-Brief Progress Note Patient Name: [name] DOB: 06/24/1980 MRN: [id number]   Date of Service  05/14/2023  HPI/Events of Note  Received clarification as there were 2 vent orders Lactate 2.1 unchanged  eICU Interventions  Discontinued the order with goal TV of 8 cc/kg and maintained the one with 6 cc/kg IBW Ongoing fluids     Intervention Category Intermediate Interventions: Other:  [name] 05/14/2023, 12:28 AM

## 2023-05-14 NOTE — Progress Notes (Signed)
 NAME:  [name], MRN:  [id number], DOB:  June 30, 1980, LOS: 1 ADMISSION DATE:  05/13/2023, CONSULTATION DATE:  05/14/23 REFERRING MD:  Prentice Medicus, M.D. CHIEF COMPLAINT:  Altered Mental Status  History of Present Illness:  43 y.o. male with end-stage multiple sclerosis and sacral osteomyelitis.  Presented to the emergency department today with altered mental status and fever.  Patient currently resides at a skilled nursing facility and with these changes EMS transported the patient to the ED for evaluation.  Normally patient is able to speak 1 word at a time.  Previously patient was on hospice but family decided so back to a Full Code Status.  Review of the electronic medical record indicates patient was admitted from 12/9 through 04/13/2023 for osteomyelitis of the coccyx and sacrum as well as a stage IV sacral decubitus ulcer.  Patient was treated with antibiotics which were stopped at the time of discharge.  Palliative Care Medicine was planned to continue following at that time.  Following arrival patient was evaluated and admitted by hospitalist service.  Shortly after the admission ED provider was called to the patient's room with hypoxia and respiratory distress.  Patient under went bag mask ventilation and then was placed on BiPAP for respiratory support.  After contacting the patient's family/brother the patient was intubated this evening in the emergency department by the ED provider.  With concern for sepsis patient was given bolus IV fluid with lactated Ringer 's and empiric antibiotic therapy with vancomycin , Flagyl , and cefepime . I personally spoke with the patient's bedside nurse and he remains febrile despite receiving Tylenol . Patient notably still has a sacral decubitus ulcer and the beginning of another ulceration on his gluteus noted by nursing staff.  Pertinent  Medical History  N/A  Significant Hospital Events: Including procedures, antibiotic start and stop dates in addition to  other pertinent events   01/12 - Presented to ED & family revoked hospice status >> intubated by EDP >> PCCM consulted for admission  Interim History / Subjective:     Objective   Blood pressure 115/83, pulse (!) 129, temperature 97.6 F (36.4 C), temperature source Axillary, resp. rate (!) 23, height 5' 10 (1.778 m), weight 61.6 kg, SpO2 100%.    Vent Mode: PRVC FiO2 (%):  [60 %-100 %] 60 % Set Rate:  [18 bmp-20 bmp] 20 bmp Vt Set:  [430 mL-580 mL] 430 mL PEEP:  [5 cmH20-10 cmH20] 8 cmH20 Plateau Pressure:  [13 cmH20-23 cmH20] 13 cmH20   Intake/Output Summary (Last 24 hours) at 05/14/2023 1058 Last data filed at 05/14/2023 0800 Gross per 24 hour  Intake 1055.22 ml  Output 260 ml  Net 795.22 ml   Filed Weights   05/13/23 1825 05/13/23 2300 05/14/23 0700  Weight: 89.5 kg 61.6 kg 61.6 kg    Examination: General: Thin, cachectic male.  No acute distress. No family at bedside.  Respiratory therapist at bedside Integument:  Warm & dry. No rash or bruising on exposed skin.  No induration or discharge around left upper extremity PICC line.  Dressing surrounding PEG tube is clean and dry. Extremities:  No cyanosis or clubbing.  Lymphatics:  No appreciated cervical or supraclavicular lymphadenopathy. HEENT:  Moist mucus membranes. No scleral icterus. Endotracheal tube in place. Cardiovascular:  Regular rate & rhythm. No edema or JVD appreciated. No murmur appreciated. Pulmonary:  Coarse sounds bilaterally. Symmetric chest wall rise on ventilator. Good aeration bilaterally. Abdomen: Soft. Nondistended. Hypoactive bowel sounds.  PEG tube in place. Musculoskeletal: Symmetrically decreased muscle  bulk. No joint deformity or effusion appreciated. Neurological:  Patient sedated on Propofol  drip. Symmetric face. Pupils symmetric. No spontaneous movements.  Psychiatric:  Unable to assess with patient intubated & sedated.  Resolved Hospital Problem list   N/A  Assessment & Plan:   Acute  Hypoxemic respiratory Failure Pneumonia, right lung - continue mechanical ventilatory support - PAD protocol with fentanyl  and propofol  - VAP protocol ordered - continue vancomycin , zosyn  and doxycycline  for pneumonia coverage - tracheal aspirate with gram positive cocci and gram negative rods  Severe Sepsis Lactic Acidosis In setting of pneumonia and possible UTI - continue antibiotics as above - remains tachycardic, will give another bolus of LR  Acute Metabolic Encephalopathy - due to sepsis - monitor  Sacral Decubitus Ulcer, POA - wound care consult  Elevated Liver Enzymes - Monitor - Abdominal US  WNL  DMII Hyperglycemia - SSI, q4hr glucose checks  End Stage Multiple Sclerosis - generalized weakness and multiple skin ulcerations and wounds from pressure injuries present on admission   Best Practice (right click and Reselect all SmartList Selections daily)   Diet/type: tubefeeds DVT prophylaxis SCD Pressure ulcer(s): present on admission  GI prophylaxis: H2B Lines: yes and it is still needed Foley:  Yes, and it is still needed Code Status:  full code Last date of multidisciplinary goals of care discussion [Will work on talking with the family today]  Labs   CBC: Recent Labs  Lab 05/13/23 1636 05/14/23 0328  WBC 12.4* 12.8*  NEUTROABS 10.5* 10.6*  HGB 11.2* 10.4*  HCT 36.0* 35.0*  MCV 87.4 89.5  PLT 550* 499*    Basic Metabolic Panel: Recent Labs  Lab 05/13/23 1636 05/14/23 0328  NA 143 145  K 4.3 4.1  CL 104 103  CO2 29 29  GLUCOSE 399* 383*  BUN 34* 40*  CREATININE 0.49* 0.70  CALCIUM 9.0 8.6*  MG 2.4 2.1  PHOS 3.6 3.9   GFR: Estimated Creatinine Clearance: 104.8 mL/min (by C-G formula based on SCr of 0.7 mg/dL). Recent Labs  Lab 05/13/23 1636 05/13/23 1857 05/13/23 2303 05/14/23 0328  PROCALCITON 0.48  --   --   --   WBC 12.4*  --   --  12.8*  LATICACIDVEN  --  2.1* 2.1*  --     Liver Function Tests: Recent Labs  Lab  05/13/23 1636 05/14/23 0328  AST 94* 54*  ALT 113* 87*  ALKPHOS 82 74  BILITOT 0.3 0.4  PROT 8.7* 7.7  ALBUMIN 2.5* 2.2*   Recent Labs  Lab 05/13/23 1636  LIPASE 47  AMYLASE 76   Recent Labs  Lab 05/13/23 2303  AMMONIA 16    ABG    Component Value Date/Time   PHART 7.43 05/13/2023 1933   PCO2ART 51 (H) 05/13/2023 1933   PO2ART 81 (L) 05/13/2023 1933   HCO3 32.7 (H) 05/13/2023 1933   TCO2 30 04/09/2023 1309   O2SAT 95.1 05/13/2023 1933     Coagulation Profile: Recent Labs  Lab 05/13/23 1636  INR 1.1    Cardiac Enzymes: Recent Labs  Lab 05/13/23 1636  CKTOTAL 34*    HbA1C: Hgb A1c MFr Bld  Date/Time Value Ref Range Status  05/13/2023 04:36 PM 8.2 (H) 4.8 - 5.6 % Final    Comment:    (NOTE) Pre diabetes:          5.7%-6.4%  Diabetes:              >6.4%  Glycemic control for   <7.0% adults  with diabetes   11/08/2021 06:08 AM 4.9 4.8 - 5.6 % Final    Comment:    (NOTE) Pre diabetes:          5.7%-6.4%  Diabetes:              >6.4%  Glycemic control for   <7.0% adults with diabetes     CBG: Recent Labs  Lab 05/13/23 2359 05/14/23 0402 05/14/23 0816  GLUCAP 348* 332* 283*      Critical care time: I have spent a total of 45 minutes of critical care time today managing the patient's severe sepsis and acute hypoxic respiratory failure requiring endotracheal intubation in the ED independent of time spent during procedures caring for the patient, discussing plan of care with staff at bedside, contacting the patient's family to discuss goals of care, and reviewing the patient's electronic medical record.  The patient's prognosis is poor.  His risk for further complications, clinical deterioration, and death given his critical illness and comorbid medical conditions is quite high.    Dorn Chill, MD [facility] Office: [phone]   See Amion for personal pager PCCM on call pager [phone] until 7pm. Please  call Elink 7p-7a. [phone]

## 2023-05-14 NOTE — Plan of Care (Signed)
  Problem: Nutritional: Goal: Maintenance of adequate nutrition will improve Outcome: Progressing   Problem: Education: Goal: Knowledge of General Education information will improve Description: Including pain rating scale, medication(s)/side effects and non-pharmacologic comfort measures Outcome: Not Progressing

## 2023-05-14 NOTE — Progress Notes (Signed)
 Initial Nutrition Assessment  DOCUMENTATION CODES:   Non-severe (moderate) malnutrition in context of chronic illness  INTERVENTION:  - Per CCM Dr. Kara, can star tube feeds and advance towards goal.  Vital 1.5 at 50 ml/h (1200 ml per day) *Start at 11mL/hr and advance by 10mL Q12H to goal of  Prosource TF20 60 ml BID Provides 1960 kcal, 121 gm protein, 917 ml free water  daily  - Monitor magnesium , potassium, and phosphorus BID for at least 3 days, MD to replete as needed, as pt is at risk for refeeding syndrome.  - FWF per CCM/MD.   - Monitor weight trends.   NUTRITION DIAGNOSIS:   Moderate Malnutrition related to chronic illness (end-stage multiple sclerosis) as evidenced by moderate fat depletion, severe muscle depletion.  GOAL:   Patient will meet greater than or equal to 90% of their needs  MONITOR:   Vent status, Labs, Weight trends, TF tolerance  REASON FOR ASSESSMENT:   Consult Enteral/tube feeding initiation and management (trickle tube feeds)  ASSESSMENT:   43 y.o. male with end-stage multiple sclerosis and sacral osteomyelitis  who presented with altered mental status and fever. Admitted for severe sepsis and acute hypoxemic respiratory failure.  1/12 Admit; family revoked hospice status; intubated  Patient is currently intubated on ventilator support MV: 8.4 L/min Temp (24hrs), Avg:101.7 F (38.7 C), Min:97.2 F (36.2 C), Max:103 F (39.4 C)  No family at bedside at time of visit.   Per chart review, patient weighed at 160# in December and weighed this admission at 135#. This could indicate a 25# or 15% weight loss in 1 month.  However, weights have varied from 165-197# over the past 3 months so question weight accuracy.   Patient also noted to have been receiving tube feeds PTA. Per Home Meds list, patient's SNF reported patient was getting Glucerna 1.5 at 43mL/hr, which provides 2340 kcals and 129g of protein daily.  RN at bedside confirms  patient has PEG tube. Again question such a drastic weight loss in a short time frame if patient was truly receiving above regimen. Will need to monitor closely.   Received consult to start trickle feeds today. Per discussion with CCM attending Dr. Kara, can advance past trickle and towards goal today.  Suspect patient may be at risk for refeeding syndrome due to muscle/fat wasting and possible significant weight loss recently. Will advance slowly and monitor electrolytes.   Medications reviewed and include: Colace, Miralax  Fentanyl  Propofol  @ 12.46mL/hr (provides 341 kcals over 24 hours)  Labs reviewed:  HA1C 8.2 Blood Glucose 283-348 x24 hours   NUTRITION - FOCUSED PHYSICAL EXAM:  Flowsheet Row Most Recent Value  Orbital Region Moderate depletion  Upper Arm Region Moderate depletion  Thoracic and Lumbar Region Moderate depletion  Buccal Region Unable to assess  Temple Region Severe depletion  Clavicle Bone Region Severe depletion  Clavicle and Acromion Bone Region Moderate depletion  Scapular Bone Region Unable to assess  Dorsal Hand Mild depletion  Patellar Region Severe depletion  Anterior Thigh Region Severe depletion  Posterior Calf Region Moderate depletion  Edema (RD Assessment) None  Hair Reviewed  Eyes Unable to assess  Mouth Unable to assess  Skin Reviewed  Nails Reviewed       Diet Order:   Diet Order             Diet NPO time specified  Diet effective now                   EDUCATION  NEEDS:  Not appropriate for education at this time  Skin:  Skin Assessment: Skin Integrity Issues: Skin Integrity Issues:: Stage IV, Stage II, Stage I Stage I: Left Hip; Right Hip Stage II: Left Hip Stage IV: Sacrum  Last BM:  1/12  Height:  Ht Readings from Last 1 Encounters:  05/13/23 5' 10 (1.778 m)   Weight:  Wt Readings from Last 1 Encounters:  05/14/23 61.6 kg    BMI:  Body mass index is 19.49 kg/m.  Estimated Nutritional Needs:  Kcal:   1850-2150 kcals Protein:  90-125 grams Fluid:  >/= 1.8L    Trude Ned RD, LDN Contact via Secure Chat.

## 2023-05-15 LAB — GLUCOSE, CAPILLARY
Glucose-Capillary: 242 mg/dL — ABNORMAL HIGH (ref 70–99)
Glucose-Capillary: 254 mg/dL — ABNORMAL HIGH (ref 70–99)
Glucose-Capillary: 281 mg/dL — ABNORMAL HIGH (ref 70–99)
Glucose-Capillary: 288 mg/dL — ABNORMAL HIGH (ref 70–99)
Glucose-Capillary: 295 mg/dL — ABNORMAL HIGH (ref 70–99)
Glucose-Capillary: 296 mg/dL — ABNORMAL HIGH (ref 70–99)
Glucose-Capillary: 323 mg/dL — ABNORMAL HIGH (ref 70–99)

## 2023-05-15 LAB — COMPREHENSIVE METABOLIC PANEL
ALT: 55 U/L — ABNORMAL HIGH (ref 0–44)
AST: 23 U/L (ref 15–41)
Albumin: 2.3 g/dL — ABNORMAL LOW (ref 3.5–5.0)
Alkaline Phosphatase: 72 U/L (ref 38–126)
Anion gap: 10 (ref 5–15)
BUN: 43 mg/dL — ABNORMAL HIGH (ref 6–20)
CO2: 28 mmol/L (ref 22–32)
Calcium: 8.8 mg/dL — ABNORMAL LOW (ref 8.9–10.3)
Chloride: 101 mmol/L (ref 98–111)
Creatinine, Ser: 0.63 mg/dL (ref 0.61–1.24)
GFR, Estimated: >60 mL/min (ref >60)
Glucose, Bld: 312 mg/dL — ABNORMAL HIGH (ref 70–99)
Potassium: 3.7 mmol/L (ref 3.5–5.1)
Sodium: 139 mmol/L (ref 135–145)
Total Bilirubin: 0.5 mg/dL (ref 0.0–1.2)
Total Protein: 7.9 g/dL (ref 6.5–8.1)

## 2023-05-15 LAB — MAGNESIUM
Magnesium: 2.3 mg/dL (ref 1.7–2.4)
Magnesium: 2.1 mg/dL (ref 1.7–2.4)

## 2023-05-15 LAB — CBC
HCT: 31.3 % — ABNORMAL LOW (ref 39.0–52.0)
Hemoglobin: 9.5 g/dL — ABNORMAL LOW (ref 13.0–17.0)
MCH: 26.8 pg (ref 26.0–34.0)
MCHC: 30.4 g/dL (ref 30.0–36.0)
MCV: 88.4 fL (ref 80.0–100.0)
Platelets: 396 10*3/uL (ref 150–400)
RBC: 3.54 MIL/uL — ABNORMAL LOW (ref 4.22–5.81)
RDW: 15.6 % — ABNORMAL HIGH (ref 11.5–15.5)
WBC: 11.2 10*3/uL — ABNORMAL HIGH (ref 4.0–10.5)
nRBC: 0 % (ref 0.0–0.2)

## 2023-05-15 LAB — URINE CULTURE: Culture: 80000 — AB

## 2023-05-15 LAB — PHOSPHORUS
Phosphorus: 2.7 mg/dL (ref 2.5–4.6)
Phosphorus: 1.6 mg/dL — ABNORMAL LOW (ref 2.5–4.6)

## 2023-05-15 LAB — TRIGLYCERIDES: Triglycerides: 198 mg/dL — ABNORMAL HIGH (ref <150)

## 2023-05-15 MED ORDER — K PHOS MONO-SOD PHOS DI & MONO 155-852-130 MG PO TABS
500.0000 mg | ORAL_TABLET | Freq: Three times a day (TID) | ORAL | Status: AC
  Administered 2023-05-15 – 2023-05-16 (×3): 500 mg
  Filled 2023-05-15 (×3): qty 2

## 2023-05-15 NOTE — Progress Notes (Signed)
 eLink Physician-Brief Progress Note Patient Name: [name] DOB: May 21, 1980 MRN: [id number]   Date of Service  05/15/2023  HPI/Events of Note  Phosphorus 1.6, potassium 3.7  eICU Interventions  K-Phos ordered    Intervention Category Minor Interventions: Electrolytes abnormality - evaluation and management  Lord Lancour 05/15/2023, 8:53 PM

## 2023-05-15 NOTE — Plan of Care (Signed)
  Problem: Fluid Volume: Goal: Ability to maintain a balanced intake and output will improve Outcome: Progressing   Problem: Nutritional: Goal: Maintenance of adequate nutrition will improve Outcome: Progressing Goal: Progress toward achieving an optimal weight will improve Outcome: Progressing   Problem: Tissue Perfusion: Goal: Adequacy of tissue perfusion will improve Outcome: Progressing   Problem: Respiratory: Goal: Ability to maintain a clear airway and adequate ventilation will improve Outcome: Progressing

## 2023-05-16 DIAGNOSIS — A419 Sepsis, unspecified organism: Secondary | ICD-10-CM | POA: Diagnosis not present

## 2023-05-16 DIAGNOSIS — R6520 Severe sepsis without septic shock: Secondary | ICD-10-CM | POA: Diagnosis not present

## 2023-05-16 LAB — CBC
HCT: 27.5 % — ABNORMAL LOW (ref 39.0–52.0)
Hemoglobin: 8.2 g/dL — ABNORMAL LOW (ref 13.0–17.0)
MCH: 26.8 pg (ref 26.0–34.0)
MCHC: 29.8 g/dL — ABNORMAL LOW (ref 30.0–36.0)
MCV: 89.9 fL (ref 80.0–100.0)
Platelets: 338 10*3/uL (ref 150–400)
RBC: 3.06 MIL/uL — ABNORMAL LOW (ref 4.22–5.81)
RDW: 15.8 % — ABNORMAL HIGH (ref 11.5–15.5)
WBC: 9.5 10*3/uL (ref 4.0–10.5)
nRBC: 0 % (ref 0.0–0.2)

## 2023-05-16 LAB — GLUCOSE, CAPILLARY
Glucose-Capillary: 273 mg/dL — ABNORMAL HIGH (ref 70–99)
Glucose-Capillary: 278 mg/dL — ABNORMAL HIGH (ref 70–99)
Glucose-Capillary: 251 mg/dL — ABNORMAL HIGH (ref 70–99)
Glucose-Capillary: 257 mg/dL — ABNORMAL HIGH (ref 70–99)
Glucose-Capillary: 226 mg/dL — ABNORMAL HIGH (ref 70–99)
Glucose-Capillary: 269 mg/dL — ABNORMAL HIGH (ref 70–99)

## 2023-05-16 LAB — BASIC METABOLIC PANEL
Anion gap: 11 (ref 5–15)
BUN: 29 mg/dL — ABNORMAL HIGH (ref 6–20)
CO2: 28 mmol/L (ref 22–32)
Calcium: 8.8 mg/dL — ABNORMAL LOW (ref 8.9–10.3)
Chloride: 101 mmol/L (ref 98–111)
Creatinine, Ser: 0.43 mg/dL — ABNORMAL LOW (ref 0.61–1.24)
GFR, Estimated: >60 mL/min (ref >60)
Glucose, Bld: 304 mg/dL — ABNORMAL HIGH (ref 70–99)
Potassium: 3 mmol/L — ABNORMAL LOW (ref 3.5–5.1)
Sodium: 140 mmol/L (ref 135–145)

## 2023-05-16 LAB — CULTURE, RESPIRATORY W GRAM STAIN

## 2023-05-16 LAB — LEGIONELLA PNEUMOPHILA SEROGP 1 UR AG: L. pneumophila Serogp 1 Ur Ag: NEGATIVE

## 2023-05-16 LAB — MAGNESIUM: Magnesium: 2 mg/dL (ref 1.7–2.4)

## 2023-05-16 LAB — PHOSPHORUS: Phosphorus: 2.7 mg/dL (ref 2.5–4.6)

## 2023-05-16 MED ORDER — INSULIN GLARGINE-YFGN 100 UNIT/ML ~~LOC~~ SOLN
16.0000 [IU] | Freq: Two times a day (BID) | SUBCUTANEOUS | Status: DC
  Administered 2023-05-16 – 2023-05-24 (×17): 16 [IU] via SUBCUTANEOUS
  Filled 2023-05-16 (×18): qty 0.16

## 2023-05-16 NOTE — Plan of Care (Signed)
  Problem: Coping: Goal: Ability to adjust to condition or change in health will improve Outcome: Progressing   Problem: Fluid Volume: Goal: Ability to maintain a balanced intake and output will improve Outcome: Progressing   Problem: Nutritional: Goal: Maintenance of adequate nutrition will improve Outcome: Progressing

## 2023-05-16 NOTE — Progress Notes (Signed)
   05/16/23 1215  Adult Ventilator Settings  Vent Mode (S)  PRVC (Changed by RN due to fatigue, SOB, pt weaned 3.5 hrs on PSV 10/5 to 5/5.)  Adult Ventilator Measurements  SpO2 100 %

## 2023-05-16 NOTE — Progress Notes (Signed)
 NAME:  [name], MRN:  [id number], DOB:  03/20/1981, LOS: 3 ADMISSION DATE:  05/13/2023, CONSULTATION DATE:  05/16/23 REFERRING MD:  Abner Hoffman, M.D. CHIEF COMPLAINT:  "Altered Mental Status"  History of Present Illness:  43 y.o. male with end-stage multiple sclerosis and sacral osteomyelitis.  Presented to the emergency department today with altered mental status and fever.  Patient currently resides at a skilled nursing facility and with these changes EMS transported the patient to the ED for evaluation.  Normally patient is able to speak 1 word at a time.  Previously patient was on hospice but family decided so back to a Full Code Status.  Review of the electronic medical record indicates patient was admitted from 12/9 through 04/13/2023 for osteomyelitis of the coccyx and sacrum as well as a stage IV sacral decubitus ulcer.  Patient was treated with antibiotics which were stopped at the time of discharge.  Palliative Care Medicine was planned to continue following at that time.  Following arrival patient was evaluated and admitted by hospitalist service.  Shortly after the admission ED provider was called to the patient's room with hypoxia and respiratory distress.  Patient under went bag mask ventilation and then was placed on BiPAP for respiratory support.  After contacting the patient's family/brother the patient was intubated this evening in the emergency department by the ED provider.  With concern for sepsis patient was given bolus IV fluid with lactated Ringer 's and empiric antibiotic therapy with vancomycin , Flagyl , and cefepime . I personally spoke with the patient's bedside nurse and he remains febrile despite receiving Tylenol . Patient notably still has a sacral decubitus ulcer and the beginning of another ulceration on his gluteus noted by nursing staff.  Pertinent  Medical History  N/A  Significant Hospital Events: Including procedures, antibiotic start and stop dates in addition to  other pertinent events   01/12 - Presented to ED & family revoked hospice status >> intubated by EDP >> PCCM consulted for admission  Interim History / Subjective:   Sedation off this morning Denies pain, interested in getting breathing tube out  Objective   Blood pressure 124/78, pulse 99, temperature 99.1 F (37.3 C), resp. rate 20, height 5\' 10"  (1.778 m), weight 64.6 kg, SpO2 100%.    Vent Mode: PRVC FiO2 (%):  [30 %-40 %] 30 % Set Rate:  [20 bmp] 20 bmp Vt Set:  [430 mL] 430 mL PEEP:  [8 cmH20] 8 cmH20 Plateau Pressure:  [17 cmH20-19 cmH20] 18 cmH20   Intake/Output Summary (Last 24 hours) at 05/16/2023 0744 Last data filed at 05/16/2023 0645 Gross per 24 hour  Intake 2150.08 ml  Output 975 ml  Net 1175.08 ml   Filed Weights   05/14/23 0700 05/15/23 0500 05/16/23 0318  Weight: 61.6 kg 63.9 kg 64.6 kg    Examination: General: Thin, cachectic male.  No acute distress.  Integument:  Warm & dry. No rash or bruising on exposed skin.  No induration or discharge around left upper extremity PICC line.  Dressing surrounding PEG tube is clean and dry. Extremities:  No cyanosis or clubbing.  Lymphatics:  No appreciated cervical or supraclavicular lymphadenopathy. HEENT:  Moist mucus membranes. No scleral icterus. Endotracheal tube in place. Cardiovascular:  Regular rate & rhythm. No edema or JVD appreciated. No murmur appreciated. Pulmonary:  clear to auscultation bilaterally Abdomen: Soft. Nondistended. Hypoactive bowel sounds.  PEG tube in place. Musculoskeletal: Symmetrically decreased muscle bulk. No joint deformity or effusion appreciated. Neurological:  able to nod yes/no.  Not moving extremities.   Resolved Hospital Problem list   Lactic Acidosis  Assessment & Plan:   Acute Hypoxemic respiratory Failure Pneumonia, right lung - continue mechanical ventilatory support - PAD protocol with fentanyl  and propofol  - VAP protocol ordered - continue zosyn  and doxycycline  for  pneumonia coverage - tracheal aspirate with gram positive cocci and gram negative rods. Pseudomonas growing. - SAT/SBT this AM  Severe Sepsis In setting of pneumonia and possible UTI - continue antibiotics as above - remains tachycardic  Acute Metabolic Encephalopathy - due to sepsis - monitor, improving  Sacral Decubitus Ulcer, POA - wound care consult  Elevated Liver Enzymes - Monitor - Abdominal US  WNL  DMII Hyperglycemia - SSI, q4hr glucose checks  End Stage Multiple Sclerosis - generalized weakness and multiple skin ulcerations and wounds from pressure injuries present on admission   Best Practice (right click and "Reselect all SmartList Selections" daily)   Diet/type: tubefeeds DVT prophylaxis SCD Pressure ulcer(s): present on admission  GI prophylaxis: H2B Lines: yes and it is still needed Foley:  Yes, and it is still needed Code Status:  full code Last date of multidisciplinary goals of care discussion [Will work on talking with the family today]  Labs   CBC: Recent Labs  Lab 05/13/23 1636 05/14/23 0328 05/15/23 0558 05/16/23 0548  WBC 12.4* 12.8* 11.2* 9.5  NEUTROABS 10.5* 10.6*  --   --   HGB 11.2* 10.4* 9.5* 8.2*  HCT 36.0* 35.0* 31.3* 27.5*  MCV 87.4 89.5 88.4 89.9  PLT 550* 499* 396 338    Basic Metabolic Panel: Recent Labs  Lab 05/13/23 1636 05/14/23 0328 05/14/23 1156 05/14/23 1712 05/15/23 0558 05/15/23 1701 05/16/23 0548  NA 143 145  --   --  139  --  140  K 4.3 4.1  --   --  3.7  --  3.0*  CL 104 103  --   --  101  --  101  CO2 29 29  --   --  28  --  28  GLUCOSE 399* 383*  --   --  312*  --  304*  BUN 34* 40*  --   --  43*  --  29*  CREATININE 0.49* 0.70  --   --  0.63  --  0.43*  CALCIUM 9.0 8.6*  --   --  8.8*  --  8.8*  MG 2.4 2.1 2.3 2.3 2.3 2.1 2.0  PHOS 3.6 3.9 3.6 3.0 2.7 1.6* 2.7   GFR: Estimated Creatinine Clearance: 109.9 mL/min (A) (by C-G formula based on SCr of 0.43 mg/dL (L)). Recent Labs  Lab  05/13/23 1636 05/13/23 1857 05/13/23 2303 05/14/23 0328 05/15/23 0558 05/16/23 0548  PROCALCITON 0.48  --   --   --   --   --   WBC 12.4*  --   --  12.8* 11.2* 9.5  LATICACIDVEN  --  2.1* 2.1*  --   --   --     Liver Function Tests: Recent Labs  Lab 05/13/23 1636 05/14/23 0328 05/15/23 0558  AST 94* 54* 23  ALT 113* 87* 55*  ALKPHOS 82 74 72  BILITOT 0.3 0.4 0.5  PROT 8.7* 7.7 7.9  ALBUMIN 2.5* 2.2* 2.3*   Recent Labs  Lab 05/13/23 1636  LIPASE 47  AMYLASE 76   Recent Labs  Lab 05/13/23 2303  AMMONIA 16    ABG    Component Value Date/Time   PHART 7.43 05/13/2023 1933   PCO2ART 51 (  H) 05/13/2023 1933   PO2ART 81 (L) 05/13/2023 1933   HCO3 32.7 (H) 05/13/2023 1933   TCO2 30 04/09/2023 1309   O2SAT 95.1 05/13/2023 1933     Coagulation Profile: Recent Labs  Lab 05/13/23 1636  INR 1.1    Cardiac Enzymes: Recent Labs  Lab 05/13/23 1636  CKTOTAL 34*    HbA1C: Hgb A1c MFr Bld  Date/Time Value Ref Range Status  05/13/2023 04:36 PM 8.2 (H) 4.8 - 5.6 % Final    Comment:    (NOTE) Pre diabetes:          5.7%-6.4%  Diabetes:              >6.4%  Glycemic control for   <7.0% adults with diabetes   11/08/2021 06:08 AM 4.9 4.8 - 5.6 % Final    Comment:    (NOTE) Pre diabetes:          5.7%-6.4%  Diabetes:              >6.4%  Glycemic control for   <7.0% adults with diabetes     CBG: Recent Labs  Lab 05/15/23 1149 05/15/23 1550 05/15/23 1933 05/15/23 2300 05/16/23 0740  GLUCAP 242* 323* 281* 273* 278*      Critical care time: I have spent a total of 35 minutes of critical care time today managing the patient's severe sepsis and acute hypoxic respiratory failure requiring endotracheal intubation in the ED independent of time spent during procedures caring for the patient, discussing plan of care with staff at bedside, contacting the patient's family to discuss goals of care, and reviewing the patient's electronic medical record.  The  patient's prognosis is poor.  His risk for further complications, clinical deterioration, and death given his critical illness and comorbid medical conditions is quite high.    Duaine German, MD [facility] Office: [phone]   See Amion for personal pager PCCM on call pager [phone] until 7pm. Please call Elink 7p-7a. [phone]

## 2023-05-16 NOTE — Progress Notes (Signed)
   05/16/23 0839  Vent Select  Invasive or Noninvasive Invasive  Adult Vent Y  Airway 7.5 mm  Placement Date/Time: 05/13/23 0620   Airway Device: Endotracheal Tube  Laryngoscope Blade: 4  ETT Types: Oral  Size (mm): 7.5 mm  Cuffed: Cuffed  Insertion attempts: 1  Placement Confirmation: ETCO2 Detector  Secured at (cm): 25 cm  Removal Reason: Pe...  Secured at (cm) 25 cm  Measured From Lips  Secured Location Right  Secured By English as a second language teacher No  Tube Holder Repositioned Yes  Prone position No  Head position Left  Cuff Pressure (cm H2O) Green OR 18-26 CmH2O  Site Condition Drainage (Comment)  Adult Ventilator Settings  Vent Type Servo i  Humidity HME  Vent Mode (S)  PSV;CPAP  FiO2 (%) (S)  30 %  Pressure Support (S)  10 cmH20  PEEP (S)  5 cmH20  Adult Ventilator Measurements  Peak Airway Pressure 16 L/min  Mean Airway Pressure 8 cmH20  Resp Rate Spontaneous 23 br/min  Resp Rate Total 23 br/min  Spont TV 489 mL  Measured Ve 9.4 L  Total PEEP 5 cmH20  SpO2 100 %  Adult Ventilator Alarms  Alarms On Y  Ve High Alarm 16 L/min  Ve Low Alarm 4 L/min  Resp Rate High Alarm 34 br/min  Resp Rate Low Alarm 10  PEEP Low Alarm 3 cmH2O  Press High Alarm 40 cmH2O  T Apnea 20 sec(s)  VAP Prevention  HOB> 30 Degrees Y  Breath Sounds  Bilateral Breath Sounds Rhonchi  Vent Respiratory Assessment  Level of Consciousness Alert  Respiratory Pattern Regular;Unlabored  Suction Method  Respiratory Interventions Oral suction;Airway suction  Oral Suctioning/Secretions  Suction Type Oral  Suction Device Yankauer  Secretion Amount Copious  Secretion Color Yellow;White  Secretion Consistency Thick  Suction Tolerance Tolerated well  Suctioning Adverse Effects None  Airway Suctioning/Secretions  Suction Type ETT  Suction Device  Catheter  Secretion Amount Copious  Secretion Color Yellow;White  Secretion Consistency Thick;Thin  Suction Tolerance Tolerated well   Suctioning Adverse Effects None

## 2023-05-16 NOTE — Progress Notes (Signed)
   05/16/23 1043  Vent Select  Invasive or Noninvasive Invasive  Adult Vent Y  Airway 7.5 mm  Placement Date/Time: 05/13/23 0620   Airway Device: Endotracheal Tube  Laryngoscope Blade: 4  ETT Types: Oral  Size (mm): 7.5 mm  Cuffed: Cuffed  Insertion attempts: 1  Placement Confirmation: ETCO2 Detector  Secured at (cm): 25 cm  Removal Reason: Pe...  Secured at (cm) 25 cm  Measured From Lips  Secured Location Right  Secured By English as a second language teacher No  Prone position No  Head position Right  Cuff Pressure (cm H2O) Green OR 18-26 CmH2O  Site Condition Drainage (Comment)  Adult Ventilator Settings  Vent Type Servo i  Humidity HME  Vent Mode PSV;CPAP  FiO2 (%) 30 %  Pressure Support (S)  5 cmH20 (Decreased to PSV 5.)  PEEP 5 cmH20  Adult Ventilator Measurements  Peak Airway Pressure 11 L/min  Mean Airway Pressure 7 cmH20  Resp Rate Spontaneous 23 br/min  Resp Rate Total 23 br/min  Spont TV 408 mL  Measured Ve 9.7 L  Total PEEP 5 cmH20  SpO2 100 %  Adult Ventilator Alarms  Alarms On Y  Ve High Alarm 16 L/min  Ve Low Alarm 4 L/min  Resp Rate High Alarm 34 br/min  Resp Rate Low Alarm 10  PEEP Low Alarm 3 cmH2O  Press High Alarm 40 cmH2O  T Apnea 20 sec(s)  VAP Prevention  HOB> 30 Degrees Y  Breath Sounds  Bilateral Breath Sounds Diminished  Vent Respiratory Assessment  Level of Consciousness Alert  Respiratory Pattern Regular;Unlabored  Suction Method  Respiratory Interventions  (suctioned by RN.)

## 2023-05-17 DIAGNOSIS — G35 Multiple sclerosis: Secondary | ICD-10-CM

## 2023-05-17 DIAGNOSIS — Z515 Encounter for palliative care: Secondary | ICD-10-CM

## 2023-05-17 DIAGNOSIS — R6520 Severe sepsis without septic shock: Secondary | ICD-10-CM | POA: Diagnosis not present

## 2023-05-17 DIAGNOSIS — A419 Sepsis, unspecified organism: Secondary | ICD-10-CM | POA: Diagnosis not present

## 2023-05-17 LAB — COMPREHENSIVE METABOLIC PANEL
ALT: 31 U/L (ref 0–44)
AST: 20 U/L (ref 15–41)
Albumin: 2 g/dL — ABNORMAL LOW (ref 3.5–5.0)
Alkaline Phosphatase: 67 U/L (ref 38–126)
Anion gap: 9 (ref 5–15)
BUN: 17 mg/dL (ref 6–20)
CO2: 28 mmol/L (ref 22–32)
Calcium: 8.5 mg/dL — ABNORMAL LOW (ref 8.9–10.3)
Chloride: 101 mmol/L (ref 98–111)
Creatinine, Ser: 0.36 mg/dL — ABNORMAL LOW (ref 0.61–1.24)
GFR, Estimated: >60 mL/min (ref >60)
Glucose, Bld: 283 mg/dL — ABNORMAL HIGH (ref 70–99)
Potassium: 2.8 mmol/L — ABNORMAL LOW (ref 3.5–5.1)
Sodium: 138 mmol/L (ref 135–145)
Total Bilirubin: 0.2 mg/dL (ref 0.0–1.2)
Total Protein: 6.8 g/dL (ref 6.5–8.1)

## 2023-05-17 LAB — CBC WITH DIFFERENTIAL/PLATELET
Abs Immature Granulocytes: 0.14 10*3/uL — ABNORMAL HIGH (ref 0.00–0.07)
Basophils Absolute: 0 10*3/uL (ref 0.0–0.1)
Basophils Relative: 0 %
Eosinophils Absolute: 0.3 10*3/uL (ref 0.0–0.5)
Eosinophils Relative: 3 %
HCT: 28.2 % — ABNORMAL LOW (ref 39.0–52.0)
Hemoglobin: 8.5 g/dL — ABNORMAL LOW (ref 13.0–17.0)
Immature Granulocytes: 2 %
Lymphocytes Relative: 9 %
Lymphs Abs: 0.9 10*3/uL (ref 0.7–4.0)
MCH: 26.4 pg (ref 26.0–34.0)
MCHC: 30.1 g/dL (ref 30.0–36.0)
MCV: 87.6 fL (ref 80.0–100.0)
Monocytes Absolute: 0.5 10*3/uL (ref 0.1–1.0)
Monocytes Relative: 5 %
Neutro Abs: 7.4 10*3/uL (ref 1.7–7.7)
Neutrophils Relative %: 81 %
Platelets: 351 10*3/uL (ref 150–400)
RBC: 3.22 MIL/uL — ABNORMAL LOW (ref 4.22–5.81)
RDW: 15.9 % — ABNORMAL HIGH (ref 11.5–15.5)
WBC: 9.2 10*3/uL (ref 4.0–10.5)
nRBC: 0 % (ref 0.0–0.2)

## 2023-05-17 LAB — GLUCOSE, CAPILLARY
Glucose-Capillary: 205 mg/dL — ABNORMAL HIGH (ref 70–99)
Glucose-Capillary: 217 mg/dL — ABNORMAL HIGH (ref 70–99)
Glucose-Capillary: 221 mg/dL — ABNORMAL HIGH (ref 70–99)
Glucose-Capillary: 226 mg/dL — ABNORMAL HIGH (ref 70–99)
Glucose-Capillary: 241 mg/dL — ABNORMAL HIGH (ref 70–99)
Glucose-Capillary: 266 mg/dL — ABNORMAL HIGH (ref 70–99)
Glucose-Capillary: 310 mg/dL — ABNORMAL HIGH (ref 70–99)

## 2023-05-17 LAB — PHOSPHORUS: Phosphorus: 2.5 mg/dL (ref 2.5–4.6)

## 2023-05-17 LAB — MAGNESIUM: Magnesium: 1.8 mg/dL (ref 1.7–2.4)

## 2023-05-17 MED ORDER — SODIUM CHLORIDE 0.9 % IV SOLN
100.0000 mg | Freq: Two times a day (BID) | INTRAVENOUS | Status: DC
  Administered 2023-05-17 – 2023-05-21 (×7): 100 mg via INTRAVENOUS
  Filled 2023-05-17 (×7): qty 100

## 2023-05-17 MED ORDER — POTASSIUM CHLORIDE 20 MEQ PO PACK
40.0000 meq | PACK | ORAL | Status: AC
  Administered 2023-05-17 – 2023-05-18 (×2): 40 meq
  Filled 2023-05-17 (×2): qty 2

## 2023-05-17 NOTE — Plan of Care (Signed)
  Problem: Respiratory: Goal: Ability to maintain a clear airway and adequate ventilation will improve Outcome: Progressing   Problem: Clinical Measurements: Goal: Ability to maintain clinical measurements within normal limits will improve Outcome: Progressing

## 2023-05-17 NOTE — TOC Progression Note (Signed)
Transition of Care [facility]) - Progression Note    Patient Details  Name: [name] MRN: [id number] Date of Birth: 1981/04/21  Transition of Care [facility]) CM/SW Contact  Kohan Azizi, Olegario Messier, RN Phone Number: 05/17/2023, 10:17 AM  Clinical Narrative: On vent. From [facility] for return.      Expected Discharge Plan: Long Term Nursing Home Barriers to Discharge: Continued Medical Work up  Expected Discharge Plan and Services                                               Social Determinants of Health (SDOH) Interventions SDOH Screenings   Food Insecurity: No Food Insecurity (04/09/2023)  Housing: Low Risk  (04/09/2023)  Transportation Needs: No Transportation Needs (04/09/2023)  Utilities: Not At Risk (04/09/2023)  Tobacco Use: High Risk (05/13/2023)    Readmission Risk Interventions     No data to display

## 2023-05-17 NOTE — Plan of Care (Signed)
  Problem: Education: Goal: Ability to describe self-care measures that may prevent or decrease complications (Diabetes Survival Skills Education) will improve Outcome: Progressing Goal: Individualized Educational Video(s) Outcome: Progressing   Problem: Coping: Goal: Ability to adjust to condition or change in health will improve Outcome: Progressing   Problem: Fluid Volume: Goal: Ability to maintain a balanced intake and output will improve Outcome: Progressing   Problem: Health Behavior/Discharge Planning: Goal: Ability to identify and utilize available resources and services will improve Outcome: Progressing Goal: Ability to manage health-related needs will improve Outcome: Progressing   Problem: Metabolic: Goal: Ability to maintain appropriate glucose levels will improve Outcome: Progressing   Problem: Nutritional: Goal: Maintenance of adequate nutrition will improve Outcome: Progressing Goal: Progress toward achieving an optimal weight will improve Outcome: Progressing   Problem: Skin Integrity: Goal: Risk for impaired skin integrity will decrease Outcome: Progressing   Problem: Tissue Perfusion: Goal: Adequacy of tissue perfusion will improve Outcome: Progressing   Problem: Activity: Goal: Ability to tolerate increased activity will improve Outcome: Progressing   Problem: Respiratory: Goal: Ability to maintain a clear airway and adequate ventilation will improve Outcome: Progressing   Problem: Role Relationship: Goal: Method of communication will improve Outcome: Progressing   Problem: Education: Goal: Knowledge of General Education information will improve Description: Including pain rating scale, medication(s)/side effects and non-pharmacologic comfort measures Outcome: Progressing   Problem: Health Behavior/Discharge Planning: Goal: Ability to manage health-related needs will improve Outcome: Progressing   Problem: Clinical Measurements: Goal:  Ability to maintain clinical measurements within normal limits will improve Outcome: Progressing Goal: Will remain free from infection Outcome: Progressing Goal: Diagnostic test results will improve Outcome: Progressing Goal: Respiratory complications will improve Outcome: Progressing Goal: Cardiovascular complication will be avoided Outcome: Progressing   Problem: Activity: Goal: Risk for activity intolerance will decrease Outcome: Progressing   Problem: Nutrition: Goal: Adequate nutrition will be maintained Outcome: Progressing   Problem: Coping: Goal: Level of anxiety will decrease Outcome: Progressing   Problem: Elimination: Goal: Will not experience complications related to bowel motility Outcome: Progressing Goal: Will not experience complications related to urinary retention Outcome: Progressing   Problem: Pain Management: Goal: General experience of comfort will improve Outcome: Progressing   Problem: Safety: Goal: Ability to remain free from injury will improve Outcome: Progressing   Problem: Skin Integrity: Goal: Risk for impaired skin integrity will decrease Outcome: Progressing

## 2023-05-17 NOTE — Progress Notes (Signed)
   05/17/23 0907  Adult Ventilator Settings  Vent Mode (S)  CPAP;PSV  FiO2 (%) (S)  30 %  Pressure Support (S)  10 cmH20  PEEP (S)  8 cmH20

## 2023-05-17 NOTE — Progress Notes (Signed)
NAME:  [name], MRN:  [id number], DOB:  1980-09-03, LOS: 4 ADMISSION DATE:  05/13/2023, CONSULTATION DATE:  05/17/23 REFERRING MD:  Beckey Downing, M.D. CHIEF COMPLAINT:  "Altered Mental Status"  History of Present Illness:  43 y.o. male with end-stage multiple sclerosis and sacral osteomyelitis.  Presented to the emergency department today with altered mental status and fever.  Patient currently resides at a skilled nursing facility and with these changes EMS transported the patient to the ED for evaluation.  Normally patient is able to speak 1 word at a time.  Previously patient was on hospice but family decided so back to a Full Code Status.  Review of the electronic medical record indicates patient was admitted from 12/9 through 04/13/2023 for osteomyelitis of the coccyx and sacrum as well as a stage IV sacral decubitus ulcer.  Patient was treated with antibiotics which were stopped at the time of discharge.  Palliative Care Medicine was planned to continue following at that time.  Following arrival patient was evaluated and admitted by hospitalist service.  Shortly after the admission ED provider was called to the patient's room with hypoxia and respiratory distress.  Patient under went bag mask ventilation and then was placed on BiPAP for respiratory support.  After contacting the patient's family/brother the patient was intubated this evening in the emergency department by the ED provider.  With concern for sepsis patient was given bolus IV fluid with lactated Ringer's and empiric antibiotic therapy with vancomycin, Flagyl, and cefepime. I personally spoke with the patient's bedside nurse and he remains febrile despite receiving Tylenol. Patient notably still has a sacral decubitus ulcer and the beginning of another ulceration on his gluteus noted by nursing staff.  Pertinent  Medical History  N/A  Significant Hospital Events: Including procedures, antibiotic start and stop dates in addition to  other pertinent events   01/12 - Presented to ED & family revoked hospice status >> intubated by EDP >> PCCM consulted for admission 1/15 PSV trial for a couple of hours, copious secretions  Interim History / Subjective:   Sedation off this morning Will place on PSV again this AM  Spoke with brother [name], he is amenable to palliative care consult to discuss future care options together at bedside with [name].   Objective   Blood pressure (!) 153/90, pulse (!) 113, temperature (!) 100.8 F (38.2 C), resp. rate 19, height 5\' 10"  (1.778 m), weight 65 kg, SpO2 100%.    Vent Mode: PRVC FiO2 (%):  [30 %] 30 % Set Rate:  [20 bmp] 20 bmp Vt Set:  [430 mL] 430 mL PEEP:  [5 cmH20-8 cmH20] 8 cmH20 Pressure Support:  [5 cmH20-10 cmH20] 5 cmH20 Plateau Pressure:  [16 cmH20-17 cmH20] 17 cmH20   Intake/Output Summary (Last 24 hours) at 05/17/2023 0730 Last data filed at 05/17/2023 0645 Gross per 24 hour  Intake 1990.08 ml  Output 890 ml  Net 1100.08 ml   Filed Weights   05/15/23 0500 05/16/23 0318 05/17/23 0424  Weight: 63.9 kg 64.6 kg 65 kg    Examination: General: Thin, cachectic male.  No acute distress.  Integument:  Warm & dry. Wounds present on admission Extremities:  No cyanosis or clubbing.  Lymphatics:  No appreciated cervical or supraclavicular lymphadenopathy. HEENT:  Moist mucus membranes. No scleral icterus. Endotracheal tube in place. Cardiovascular:  Regular rate & rhythm. No edema or JVD appreciated. No murmur appreciated. Pulmonary:  course breath sounds Abdomen: Soft. Nondistended. Hypoactive bowel sounds.  PEG tube  in place. Musculoskeletal: Symmetrically decreased muscle bulk. No joint deformity or effusion appreciated. Neurological:  able to nod yes/no. Not moving extremities.   Resolved Hospital Problem list   Lactic Acidosis  Assessment & Plan:   Acute Hypoxemic respiratory Failure Pneumonia, right lung - continue mechanical ventilatory support - PAD  protocol with fentanyl and propofol - VAP protocol ordered - continue zosyn and doxycycline for pneumonia coverage - tracheal aspirate with gram positive cocci and gram negative rods. Pseudomonas growing. - SAT/SBT this AM  Severe Sepsis In setting of pneumonia and possible UTI - continue antibiotics as above - remains tachycardic  Acute Metabolic Encephalopathy - due to sepsis - monitor, improving  Sacral Decubitus Ulcer, POA - wound care consult  Elevated Liver Enzymes - Monitor - Abdominal US WNL  DMII Hyperglycemia - SSI, q4hr glucose checks  End Stage Multiple Sclerosis - generalized weakness and multiple skin ulcerations and wounds from pressure injuries present on admission - will consult palliative care for assistance with goals of care discussions   Best Practice (right click and "Reselect all SmartList Selections" daily)   Diet/type: tubefeeds DVT prophylaxis SCD Pressure ulcer(s): present on admission  GI prophylaxis: H2B Lines: yes and it is still needed Foley:  Yes, and it is still needed Code Status:  full code Last date of multidisciplinary goals of care discussion [1/16, talked with [name] - brother. Plan to consult palliative care to arrange family meeting and talk long term goals of care]  Labs   CBC: Recent Labs  Lab 05/13/23 1636 05/14/23 0328 05/15/23 0558 05/16/23 0548  WBC 12.4* 12.8* 11.2* 9.5  NEUTROABS 10.5* 10.6*  --   --   HGB 11.2* 10.4* 9.5* 8.2*  HCT 36.0* 35.0* 31.3* 27.5*  MCV 87.4 89.5 88.4 89.9  PLT 550* 499* 396 338    Basic Metabolic Panel: Recent Labs  Lab 05/13/23 1636 05/14/23 0328 05/14/23 1156 05/14/23 1712 05/15/23 0558 05/15/23 1701 05/16/23 0548  NA 143 145  --   --  139  --  140  K 4.3 4.1  --   --  3.7  --  3.0*  CL 104 103  --   --  101  --  101  CO2 29 29  --   --  28  --  28  GLUCOSE 399* 383*  --   --  312*  --  304*  BUN 34* 40*  --   --  43*  --  29*  CREATININE 0.49* 0.70  --   --  0.63   --  0.43*  CALCIUM 9.0 8.6*  --   --  8.8*  --  8.8*  MG 2.4 2.1 2.3 2.3 2.3 2.1 2.0  PHOS 3.6 3.9 3.6 3.0 2.7 1.6* 2.7   GFR: Estimated Creatinine Clearance: 110.6 mL/min (A) (by C-G formula based on SCr of 0.43 mg/dL (L)). Recent Labs  Lab 05/13/23 1636 05/13/23 1857 05/13/23 2303 05/14/23 0328 05/15/23 0558 05/16/23 0548  PROCALCITON 0.48  --   --   --   --   --   WBC 12.4*  --   --  12.8* 11.2* 9.5  LATICACIDVEN  --  2.1* 2.1*  --   --   --     Liver Function Tests: Recent Labs  Lab 05/13/23 1636 05/14/23 0328 05/15/23 0558  AST 94* 54* 23  ALT 113* 87* 55*  ALKPHOS 82 74 72  BILITOT 0.3 0.4 0.5  PROT 8.7* 7.7 7.9  ALBUMIN 2.5* 2.2* 2.3*  Recent Labs  Lab 05/13/23 1636  LIPASE 47  AMYLASE 76   Recent Labs  Lab 05/13/23 2303  AMMONIA 16    ABG    Component Value Date/Time   PHART 7.43 05/13/2023 1933   PCO2ART 51 (H) 05/13/2023 1933   PO2ART 81 (L) 05/13/2023 1933   HCO3 32.7 (H) 05/13/2023 1933   TCO2 30 04/09/2023 1309   O2SAT 95.1 05/13/2023 1933     Coagulation Profile: Recent Labs  Lab 05/13/23 1636  INR 1.1    Cardiac Enzymes: Recent Labs  Lab 05/13/23 1636  CKTOTAL 34*    HbA1C: Hgb A1c MFr Bld  Date/Time Value Ref Range Status  05/13/2023 04:36 PM 8.2 (H) 4.8 - 5.6 % Final    Comment:    (NOTE) Pre diabetes:          5.7%-6.4%  Diabetes:              >6.4%  Glycemic control for   <7.0% adults with diabetes   11/08/2021 06:08 AM 4.9 4.8 - 5.6 % Final    Comment:    (NOTE) Pre diabetes:          5.7%-6.4%  Diabetes:              >6.4%  Glycemic control for   <7.0% adults with diabetes     CBG: Recent Labs  Lab 05/16/23 1149 05/16/23 1549 05/16/23 1948 05/16/23 2312 05/17/23 0406  GLUCAP 251* 257* 226* 269* 266*      Critical care time: I have spent a total of 35 minutes of critical care time today managing the patient's severe sepsis and acute hypoxic respiratory failure requiring endotracheal  intubation in the ED independent of time spent during procedures caring for the patient, discussing plan of care with staff at bedside, contacting the patient's family to discuss goals of care, and reviewing the patient's electronic medical record.  The patient's prognosis is poor.  His risk for further complications, clinical deterioration, and death given his critical illness and comorbid medical conditions is quite high.    Melody Comas, MD [facility] Office: [phone]   See Amion for personal pager PCCM on call pager [phone] until 7pm. Please call Elink 7p-7a. [phone]

## 2023-05-17 NOTE — Progress Notes (Signed)
eLink Physician-Brief Progress Note Patient Name: [name] DOB: 1981/03/15 MRN: [id number]   Date of Service  05/17/2023  HPI/Events of Note   Pt more awake, reaching for tube.  eICU Interventions  OK to resume propofol Hypokalemia -repleted      Intervention Category Major Interventions: Delirium, psychosis, severe agitation - evaluation and management  Khristin Keleher V. Kaydan Wong 05/17/2023, 8:42 PM

## 2023-05-17 NOTE — Consult Note (Signed)
Consultation Note Date: 05/17/2023   Patient Name: [name]  DOB: 07/03/1980  MRN: [id number]  Age / Sex: 43 y.o., male  PCP: Lonie Peak, PA-C Referring Physician: Martina Sinner, MD  Reason for Consultation:   goals of care, Brother [name] is involved in decision making. Open to having meeting at bedside with patient to determine long term care goals. Was on hospice previously then revoked.  HPI/Patient Profile: 43 y.o. male  with past medical history of end stage MS, sacral decubitis with osteomyelitis, DM2, admitted on 05/13/2023 with altered mental status. Found to be septic due to UTI, and aspiration pneumonia. Blood cultures negative to date. Currently intubated. Palliative medicine consulted for goals of care.    Primary Decision Maker NEXT OF KIN - patient has a daughter, however, she is estranged- patient's brother [name] is surrogate decision maker  Discussion: Chart reviewed including labs, progress notes, imaging from this and previous encounters.  Prior to admission patient bedbound, nonverbal at baseline but able to track and nod appropriately. PEG tube in place. Per chart review he was on hospice, however, hospice was revoked for hospitalization.  He is currently weaning on pressure support.  Evaluated patient- he was diaphoretic. He nodded "no" to inquiry of pain. I introduced myself. Noted will call his brother and plan a time to meet- Earlis nodded "yes" in response.  I called Damire's brother [name]. Introduced Palliative medicine. [name] notes he is familiar with Palliative services and hospice- his mother died earlier this year and [name] had to be Management consultant.  [name] is unavailable to meet until after 3pm tomorrow. Plan made to meet at 3:30.     SUMMARY OF RECOMMENDATIONS -Sepsis d/t pneumonia, UTI in setting of end stage MS, chronic osteomyelitis- continue current  plan- mental status appears improved today -Goals of care- patient's brother [name] is Runner, broadcasting/film/video. Plan to meet with [name] at 3:30 tomorrow to discuss long term goals of care    Code Status/Advance Care Planning: Full code   Prognosis:   Unable to determine  Discharge Planning: To Be Determined  Primary Diagnoses: Present on Admission:  Severe sepsis (HCC)   Review of Systems  Unable to perform ROS: Intubated    Physical Exam Vitals and nursing note reviewed.  Constitutional:      Appearance: He is ill-appearing and diaphoretic.  Pulmonary:     Comments: intubated Neurological:     Mental Status: He is alert.     Comments: Unable to assess orientation, able to track     Vital Signs: BP (!) 155/93   Pulse (!) 112   Temp (!) 100.8 F (38.2 C)   Resp (!) 23   Ht 5\' 10"  (1.778 m)   Wt 65 kg   SpO2 100%   BMI 20.56 kg/m  Pain Scale: CPOT   Pain Score: Asleep   SpO2: SpO2: 100 % O2 Device:SpO2: 100 % O2 Flow Rate: .   IO: Intake/output summary:  Intake/Output Summary (Last 24 hours) at 05/17/2023 1341 Last data  filed at 05/17/2023 1000 Gross per 24 hour  Intake 2186.61 ml  Output 890 ml  Net 1296.61 ml    LBM: Last BM Date : 05/13/23 Baseline Weight: Weight: 89.5 kg Most recent weight: Weight: 65 kg       Thank you for this consult. Palliative medicine will continue to follow and assist as needed.  Time Total: 60 minutes Signed by: Ocie Bob, AGNP-C Palliative Medicine  Time includes:   Preparing to see the patient (e.g., review of tests) Obtaining and/or reviewing separately obtained history Performing a medically necessary appropriate examination and/or evaluation Counseling and educating the patient/family/caregiver Ordering medications, tests, or procedures Referring and communicating with other health care professionals (when not reported separately) Documenting clinical information in the electronic or other health  record Independently interpreting results (not reported separately) and communicating results to the patient/family/caregiver Care coordination (not reported separately) Clinical documentation   Please contact Palliative Medicine Team phone at [phone] for questions and concerns.  For individual provider: See Loretha Stapler

## 2023-05-17 NOTE — Progress Notes (Signed)
Nutrition Follow-up  DOCUMENTATION CODES:   Non-severe (moderate) malnutrition in context of chronic illness  INTERVENTION:  - Continue current TF regimen: Vital 1.5 at 50 ml/h (1200 ml per day) Prosource TF20 60 ml BID Provides 1960 kcal, 121 gm protein, 917 ml free water daily   - Monitor magnesium, potassium, and phosphorus BID for at least 3 days, MD to replete as needed, as pt is at risk for refeeding syndrome.   - FWF per CCM/MD.    - Monitor weight trends.    NUTRITION DIAGNOSIS:   Moderate Malnutrition related to chronic illness (end-stage multiple sclerosis) as evidenced by moderate fat depletion, severe muscle depletion. *ongoing  GOAL:   Patient will meet greater than or equal to 90% of their needs *met with TF  MONITOR:   Vent status, Labs, Weight trends, TF tolerance  REASON FOR ASSESSMENT:   Consult Enteral/tube feeding initiation and management (trickle tube feeds)  ASSESSMENT:   43 y.o. male with end-stage multiple sclerosis and sacral osteomyelitis  who presented with altered mental status and fever. Admitted for severe sepsis and acute hypoxemic respiratory failure.  1/12 Admit; family revoked hospice status; intubated  1/13 Vital 1.5 started at 28mL/hr  Patient remains intubated on ventilator support MV: 9.9 L/min Temp (24hrs), Avg:100.6 F (38.1 C), Min:100.2 F (37.9 C), Max:101.7 F (38.7 C)  No family at bedside at time of visit. TF infusing at goal of 64mL/hr. Appears to be tolerating well. Reached goal yesterday morning. Appears to be refeeding, potassium and phosphorus both dropped upon initiation of tube feeds.  Palliative care now consulted. Planning to meet with patient's brother tomorrow to discuss long term GOC.  Admit weight: 135# Current weight: 143# I&O's: +5.5L + for mild putting RLE/LLE edema  Medications reviewed and include: Colace  Labs reviewed:   Latest Reference Range & Units 05/14/23 03:28 05/15/23 05:58  05/16/23 05:48 05/17/23 08:49  Potassium 3.5 - 5.1 mmol/L 4.1 3.7 3.0 (L) 2.8 (L)  (L): Data is abnormally low   Latest Reference Range & Units 05/14/23 03:28 05/14/23 11:56 05/14/23 17:12 05/15/23 05:58 05/15/23 17:01 05/16/23 05:48 05/17/23 08:49  Phosphorus 2.5 - 4.6 mg/dL 3.9 3.6 3.0 2.7 1.6 (L) 2.7 2.5  (L): Data is abnormally low  HA1C 8.3 Blood Glucose 221-278 x24 hours   Diet Order:   Diet Order             Diet NPO time specified  Diet effective now                   EDUCATION NEEDS:  Not appropriate for education at this time  Skin:  Skin Assessment: Skin Integrity Issues: Skin Integrity Issues:: Stage IV, Stage II, Stage I Stage I: Left Hip; Right Hip Stage II: Left Hip Stage IV: Sacrum  Last BM:  1/12  Height:  Ht Readings from Last 1 Encounters:  05/16/23 5\' 10"  (1.778 m)   Weight:  Wt Readings from Last 1 Encounters:  05/17/23 65 kg    BMI:  Body mass index is 20.56 kg/m.  Estimated Nutritional Needs:  Kcal:  1850-2150 kcals Protein:  90-125 grams Fluid:  >/= 1.8L    Shelle Iron RD, LDN Contact via Secure Chat.

## 2023-05-17 NOTE — Progress Notes (Signed)
   05/17/23 1408  Adult Ventilator Settings  Vent Mode (S)  PRVC (Ended vent wean due to distress, placed back on full support, pt weaned 5 hrs. on PSV 10/8 to 5/5.)

## 2023-05-18 DIAGNOSIS — R6520 Severe sepsis without septic shock: Secondary | ICD-10-CM | POA: Diagnosis not present

## 2023-05-18 DIAGNOSIS — A419 Sepsis, unspecified organism: Secondary | ICD-10-CM | POA: Diagnosis not present

## 2023-05-18 LAB — CULTURE, BLOOD (ROUTINE X 2)
Culture: NO GROWTH
Culture: NO GROWTH

## 2023-05-18 LAB — GLUCOSE, CAPILLARY
Glucose-Capillary: 147 mg/dL — ABNORMAL HIGH (ref 70–99)
Glucose-Capillary: 152 mg/dL — ABNORMAL HIGH (ref 70–99)
Glucose-Capillary: 155 mg/dL — ABNORMAL HIGH (ref 70–99)
Glucose-Capillary: 197 mg/dL — ABNORMAL HIGH (ref 70–99)
Glucose-Capillary: 212 mg/dL — ABNORMAL HIGH (ref 70–99)
Glucose-Capillary: 236 mg/dL — ABNORMAL HIGH (ref 70–99)

## 2023-05-18 LAB — BASIC METABOLIC PANEL
Anion gap: 11 (ref 5–15)
BUN: 18 mg/dL (ref 6–20)
CO2: 28 mmol/L (ref 22–32)
Calcium: 8.9 mg/dL (ref 8.9–10.3)
Chloride: 106 mmol/L (ref 98–111)
Creatinine, Ser: 0.31 mg/dL — ABNORMAL LOW (ref 0.61–1.24)
GFR, Estimated: >60 mL/min (ref >60)
Glucose, Bld: 228 mg/dL — ABNORMAL HIGH (ref 70–99)
Potassium: 4.1 mmol/L (ref 3.5–5.1)
Sodium: 145 mmol/L (ref 135–145)

## 2023-05-18 LAB — CBC
HCT: 28.7 % — ABNORMAL LOW (ref 39.0–52.0)
Hemoglobin: 8.3 g/dL — ABNORMAL LOW (ref 13.0–17.0)
MCH: 26.5 pg (ref 26.0–34.0)
MCHC: 28.9 g/dL — ABNORMAL LOW (ref 30.0–36.0)
MCV: 91.7 fL (ref 80.0–100.0)
Platelets: 321 10*3/uL (ref 150–400)
RBC: 3.13 MIL/uL — ABNORMAL LOW (ref 4.22–5.81)
RDW: 16 % — ABNORMAL HIGH (ref 11.5–15.5)
WBC: 8.1 10*3/uL (ref 4.0–10.5)
nRBC: 0 % (ref 0.0–0.2)

## 2023-05-18 LAB — MAGNESIUM: Magnesium: 1.8 mg/dL (ref 1.7–2.4)

## 2023-05-18 LAB — TRIGLYCERIDES: Triglycerides: 129 mg/dL (ref <150)

## 2023-05-18 MED ORDER — ORAL CARE MOUTH RINSE: 15.0000 mL | OROMUCOSAL | Status: DC

## 2023-05-18 MED ORDER — MAGNESIUM SULFATE 2 GM/50ML IV SOLN
2.0000 g | Freq: Once | INTRAVENOUS | Status: AC
  Administered 2023-05-18: 2 g via INTRAVENOUS
  Filled 2023-05-18: qty 50

## 2023-05-18 MED ORDER — ORAL CARE MOUTH RINSE
15.0000 mL | OROMUCOSAL | Status: DC
  Administered 2023-05-18 – 2023-05-19 (×5): 15 mL via OROMUCOSAL

## 2023-05-18 MED ORDER — ORAL CARE MOUTH RINSE: 15.0000 mL | OROMUCOSAL | Status: DC | PRN

## 2023-05-18 NOTE — Progress Notes (Signed)
Daily Progress Note   Patient Name: [name]       Date: 05/18/2023 DOB: [birth year]  Age: 43 y.o. MRN#: [id number] Attending Physician: Martina Sinner, MD Primary Care Physician: Lonie Peak, PA-C Admit Date: 05/13/2023  Reason for Consultation/Follow-up: Establishing goals of care Brother [name] is involved in decision making. Open to having meeting at bedside with patient to determine long term care goals. Was on hospice previously then revoked.   Patient Profile/HPI:   43 y.o. male  with past medical history of end stage MS, sacral decubitis with osteomyelitis, DM2, admitted on 05/13/2023 with altered mental status. Found to be septic due to UTI, and aspiration pneumonia. Blood cultures negative to date. Currently intubated. Palliative medicine consulted for goals of care.   Subjective: Chart reviewed including labs, progress notes, imaging from this and previous encounters.  Evaluated patient at bedside. He responded with very slight head nods that were not consistent. Per chart review neurology documented in May of 2024 that patient was having cognitive decline with memory issues, confusion, and disorientation. I am not confident that patient has capacity for medical decision making.  Met with patient's brother [name].  Discussed [name]'s current acute illness, risk of decompensating, and his overall chronic illness and the need to discuss goals of care.  At baseline patient is bedbound and nonverbal. [name] has seen great decline in his status over the last year. [name] was able to communicate some at the beginning of last year.  We discussed that [name] is at high risk of needing to be reintubated and if he is reintubated he is at high risk of becoming ventilator dependent and  requiring tracheostomy if life prolonging care is desired. We also discussed that patient is likely to continue to have repeated infections and hospitalizations due to his dysphagia and his osteomyelitis and sacral decubitis. I also broached concept of if patient would want to continue this cycle of repeated infections and hospitalizations vs transitioning to comfort care and hospice. Patient was previously on hospice- but was discharged for hospital admission in November. [name] does not know who consented to hospice.  [name] is hesitant to make any end of life or goals of care decisions for patient. He would like to make attempts at reaching patient's daughter and involving her in decision making.  There is  no contact information available for patient's wife- chart notes indicated they were separated in 2020- and the phone number for patient's daughter is out of service. Patient also has a son- however, he is only 15.  I reached out to [facility] for possible documented goals of care discussion and they did not have any documentation of prior goals of care discussions- their listed primary contact was patient's daughter Magda Paganini.   Review of Systems  Unable to perform ROS: Acuity of condition     Physical Exam Vitals and nursing note reviewed.  Constitutional:      Appearance: He is ill-appearing and diaphoretic.  Cardiovascular:     Rate and Rhythm: Tachycardia present.  Pulmonary:     Comments: Increased effort Neurological:     Comments: nonverbal             Vital Signs: BP (!) 140/89   Pulse (!) 121   Temp (!) 100.6 F (38.1 C)   Resp 20   Ht 5\' 10"  (1.778 m)   Wt 66.7 kg   SpO2 100%   BMI 21.10 kg/m  SpO2: SpO2: 100 % O2 Device: O2 Device: High Flow Nasal Cannula O2 Flow Rate: O2 Flow Rate (L/min): (S) 4 L/min (Weaned to 4 L HFNC.)  Intake/output summary:  Intake/Output Summary (Last 24 hours) at 05/18/2023 1556 Last data filed at 05/18/2023 1219 Gross per 24 hour   Intake 2739.6 ml  Output 475 ml  Net 2264.6 ml   LBM: Last BM Date : 05/17/23 Baseline Weight: Weight: 89.5 kg Most recent weight: Weight: 66.7 kg       Palliative Assessment/Data: 10% (with PEG tube)      Patient Active Problem List   Diagnosis Date Noted   Severe sepsis (HCC) 05/13/2023   Hypomagnesemia 04/11/2023   Normocytic anemia 04/10/2023   Sacral decubitus ulcer, stage IV (HCC) 04/10/2023   Sepsis due to osteomyelitis of the coccyx and sacrum (HCC) 04/09/2023   Malnutrition of moderate degree 02/02/2023   History of pulmonary embolus (PE) 02/01/2023   Hypernatremia 02/01/2023   Hypokalemia 02/01/2023   Dysphagia 02/01/2023   Muscle spasms of neck    Neuropathic pain    Vitamin B12 deficiency    Hypoalbuminemia    Urinary frequency    Tobacco abuse    Steroid-induced hyperglycemia    Marijuana abuse, continuous 04/12/2017   Vitamin D deficiency 04/12/2017   Multiple sclerosis pseudoexacerbation (HCC)    Abnormality of gait 05/10/2016   Hypertension    Depression    Multiple sclerosis (HCC) 03/02/2011    Palliative Care Assessment & Plan    Assessment/Recommendations/Plan  Multiple infections- sacral decubitis with osteomyelitis, aspiration pneumonia, UTI- continue current plan of care Goals of care- difficult to discern- patient is unable to make decisions, his brother is hesitant to be decision maker- brother plans to make attempts to reach patient's daughter I have also placed a TOC consult requesting due diligence be made to contact either patient's spouse or daughter so that we can involve them in decision making or confirm they do not want to be involved    Code Status: Full code  Prognosis:  Unable to determine  Discharge Planning: To Be Determined  Care plan was discussed with patient's family and care team.   Thank you for allowing the Palliative Medicine Team to assist in the care of this patient.  Total time: 90 minutes Prolonged  billing:  Time includes:   Preparing to see the patient (e.g., review of tests)  Obtaining and/or reviewing separately obtained history Performing a medically necessary appropriate examination and/or evaluation Counseling and educating the patient/family/caregiver Ordering medications, tests, or procedures Referring and communicating with other health care professionals (when not reported separately) Documenting clinical information in the electronic or other health record Independently interpreting results (not reported separately) and communicating results to the patient/family/caregiver Care coordination (not reported separately) Clinical documentation  Ocie Bob, AGNP-C Palliative Medicine   Please contact Palliative Medicine Team phone at [phone] for questions and concerns.

## 2023-05-18 NOTE — Plan of Care (Signed)
  Problem: Fluid Volume: Goal: Ability to maintain a balanced intake and output will improve Outcome: Progressing   Problem: Metabolic: Goal: Ability to maintain appropriate glucose levels will improve Outcome: Progressing   Problem: Nutritional: Goal: Maintenance of adequate nutrition will improve Outcome: Progressing Goal: Progress toward achieving an optimal weight will improve Outcome: Progressing   Problem: Skin Integrity: Goal: Risk for impaired skin integrity will decrease Outcome: Progressing   Problem: Tissue Perfusion: Goal: Adequacy of tissue perfusion will improve Outcome: Progressing   Problem: Activity: Goal: Ability to tolerate increased activity will improve Outcome: Progressing   Problem: Respiratory: Goal: Ability to maintain a clear airway and adequate ventilation will improve Outcome: Progressing   Problem: Role Relationship: Goal: Method of communication will improve Outcome: Progressing   Problem: Education: Goal: Knowledge of General Education information will improve Description: Including pain rating scale, medication(s)/side effects and non-pharmacologic comfort measures Outcome: Progressing   Problem: Health Behavior/Discharge Planning: Goal: Ability to manage health-related needs will improve Outcome: Progressing   Problem: Clinical Measurements: Goal: Ability to maintain clinical measurements within normal limits will improve Outcome: Progressing Goal: Will remain free from infection Outcome: Progressing Goal: Diagnostic test results will improve Outcome: Progressing Goal: Respiratory complications will improve Outcome: Progressing Goal: Cardiovascular complication will be avoided Outcome: Progressing   Problem: Activity: Goal: Risk for activity intolerance will decrease Outcome: Progressing   Problem: Nutrition: Goal: Adequate nutrition will be maintained Outcome: Progressing   Problem: Coping: Goal: Level of anxiety will  decrease Outcome: Progressing   Problem: Elimination: Goal: Will not experience complications related to bowel motility Outcome: Progressing Goal: Will not experience complications related to urinary retention Outcome: Progressing   Problem: Pain Management: Goal: General experience of comfort will improve Outcome: Progressing   Problem: Safety: Goal: Ability to remain free from injury will improve Outcome: Progressing   Problem: Skin Integrity: Goal: Risk for impaired skin integrity will decrease Outcome: Progressing   Cindy S. Clelia Croft BSN, RN, [employer], CCRN 05/18/2023 3:27 AM

## 2023-05-18 NOTE — Progress Notes (Signed)
PHARMACY NOTE -  Zosyn  Pharmacy has been assisting with dosing of Zosyn for wound infection. Dosage remains stable at 3.375 g IV q8 hr and further renal adjustments per institutional Pharmacy antibiotic protocol  Pharmacy will sign off, following peripherally for culture results, dose adjustments, and length of therapy. Please reconsult if a change in clinical status warrants re-evaluation of dosage.  Bernadene Person, PharmD, BCPS [phone] 05/18/2023, 1:50 PM

## 2023-05-18 NOTE — Inpatient Diabetes Management (Signed)
Inpatient Diabetes Program Recommendations  AACE/ADA: New Consensus Statement on Inpatient Glycemic Control (2015)  Target Ranges:  Prepandial:   less than 140 mg/dL      Peak postprandial:   less than 180 mg/dL (1-2 hours)      Critically ill patients:  140 - 180 mg/dL   Lab Results  Component Value Date   GLUCAP 197 (H) 05/18/2023   HGBA1C 8.2 (H) 05/13/2023    Review of Glycemic Control  Latest Reference Range & Units 05/17/23 07:52 05/17/23 11:49 05/17/23 15:39 05/17/23 19:57 05/17/23 23:35 05/18/23 03:18 05/18/23 07:53  Glucose-Capillary 70 - 99 mg/dL 409 (H) 811 (H) 914 (H) 241 (H) 205 (H) 212 (H) 197 (H)   Current orders for Inpatient glycemic control:  Semglee 16 units bid Novolog 0-9 units Q4 hours  A1c 8.2 on 1/12 Vital 50 ml/hour  Inpatient Diabetes Program Recommendations:    -   if in the plan of care may consider Novolog 3 units Q4 hours Tube Feed coverage.  Thanks,  Christena Deem RN, MSN, BC-ADM Inpatient Diabetes Coordinator Team Pager [phone] (8a-5p)

## 2023-05-18 NOTE — Progress Notes (Signed)
NAME:  [name], MRN:  [id number], DOB:  March 06, 1981, LOS: 5 ADMISSION DATE:  05/13/2023, CONSULTATION DATE:  05/18/23 REFERRING MD:  Beckey Downing, M.D. CHIEF COMPLAINT:  "Altered Mental Status"  History of Present Illness:  43 y.o. male with end-stage multiple sclerosis and sacral osteomyelitis.  Presented to the emergency department today with altered mental status and fever.  Patient currently resides at a skilled nursing facility and with these changes EMS transported the patient to the ED for evaluation.  Normally patient is able to speak 1 word at a time.  Previously patient was on hospice but family decided so back to a Full Code Status.  Review of the electronic medical record indicates patient was admitted from 12/9 through 04/13/2023 for osteomyelitis of the coccyx and sacrum as well as a stage IV sacral decubitus ulcer.  Patient was treated with antibiotics which were stopped at the time of discharge.  Palliative Care Medicine was planned to continue following at that time.  Following arrival patient was evaluated and admitted by hospitalist service.  Shortly after the admission ED provider was called to the patient's room with hypoxia and respiratory distress.  Patient under went bag mask ventilation and then was placed on BiPAP for respiratory support.  After contacting the patient's family/brother the patient was intubated this evening in the emergency department by the ED provider.  With concern for sepsis patient was given bolus IV fluid with lactated Ringer's and empiric antibiotic therapy with vancomycin, Flagyl, and cefepime. I personally spoke with the patient's bedside nurse and he remains febrile despite receiving Tylenol. Patient notably still has a sacral decubitus ulcer and the beginning of another ulceration on his gluteus noted by nursing staff.  Pertinent  Medical History  N/A  Significant Hospital Events: Including procedures, antibiotic start and stop dates in addition to  other pertinent events   01/12 - Presented to ED & family revoked hospice status >> intubated by EDP >> PCCM consulted for admission 1/15 PSV trial for a couple of hours, copious secretions  Interim History / Subjective:   Tolerating PSV trial this morning, some secretions noted - thick    Objective   Blood pressure (!) 148/104, pulse (!) 109, temperature 99.7 F (37.6 C), resp. rate 15, height 5\' 10"  (1.778 m), weight 66.7 kg, SpO2 100%.    Vent Mode: PSV;CPAP FiO2 (%):  [30 %] 30 % Set Rate:  [20 bmp] 20 bmp Vt Set:  [430 mL] 430 mL PEEP:  [5 cmH20-8 cmH20] 5 cmH20 Pressure Support:  [5 cmH20-10 cmH20] 5 cmH20 Plateau Pressure:  [21 cmH20] 21 cmH20   Intake/Output Summary (Last 24 hours) at 05/18/2023 1114 Last data filed at 05/18/2023 0929 Gross per 24 hour  Intake 2416.09 ml  Output 725 ml  Net 1691.09 ml   Filed Weights   05/16/23 0318 05/17/23 0424 05/18/23 0416  Weight: 64.6 kg 65 kg 66.7 kg    Examination: General: Thin, cachectic male.  No acute distress.  Integument:  Warm & dry. Wounds present on admission Extremities:  No cyanosis or clubbing.  Lymphatics:  No appreciated cervical or supraclavicular lymphadenopathy. HEENT:  Moist mucus membranes. No scleral icterus. Endotracheal tube in place. Cardiovascular:  Regular rate & rhythm. No edema or JVD appreciated. No murmur appreciated. Pulmonary:  clear to auscultation, thick secretions noted Abdomen: Soft. Nondistended.  PEG tube in place. Musculoskeletal: Symmetrically decreased muscle bulk. No joint deformity or effusion appreciated. Neurological:  able to nod yes/no. Moving left hand  Resolved  Hospital Problem list   Lactic Acidosis  Assessment & Plan:   Acute Hypoxemic respiratory Failure Pneumonia, right lung - continue mechanical ventilatory support - PAD protocol with fentanyl and propofol - VAP protocol ordered - continue zosyn and doxycycline for pneumonia coverage - tracheal aspirate with  gram positive cocci and gram negative rods. Pseudomonas growing. - SAT/SBT this AM  Severe Sepsis In setting of pneumonia and possible UTI - continue antibiotics as above - remains tachycardic  Acute Metabolic Encephalopathy - due to sepsis - monitor, improving  Sacral Decubitus Ulcer, POA - wound care consult  Elevated Liver Enzymes - Monitor - Abdominal US WNL  DMII Hyperglycemia - SSI, q4hr glucose checks  End Stage Multiple Sclerosis - generalized weakness and multiple skin ulcerations and wounds from pressure injuries present on admission - will consult palliative care for assistance with goals of care discussions   Best Practice (right click and "Reselect all SmartList Selections" daily)   Diet/type: tubefeeds DVT prophylaxis SCD Pressure ulcer(s): present on admission  GI prophylaxis: H2B Lines: yes and it is still needed Foley:  Yes, and it is still needed Code Status:  full code Last date of multidisciplinary goals of care discussion [1/16, talked with [name] - brother. Plan to consult palliative care to arrange family meeting and talk long term goals of care]  Labs   CBC: Recent Labs  Lab 05/13/23 1636 05/14/23 0328 05/15/23 0558 05/16/23 0548 05/17/23 0849 05/18/23 0325  WBC 12.4* 12.8* 11.2* 9.5 9.2 8.1  NEUTROABS 10.5* 10.6*  --   --  7.4  --   HGB 11.2* 10.4* 9.5* 8.2* 8.5* 8.3*  HCT 36.0* 35.0* 31.3* 27.5* 28.2* 28.7*  MCV 87.4 89.5 88.4 89.9 87.6 91.7  PLT 550* 499* 396 338 351 321    Basic Metabolic Panel: Recent Labs  Lab 05/14/23 0328 05/14/23 1156 05/14/23 1712 05/15/23 0558 05/15/23 1701 05/16/23 0548 05/17/23 0849 05/18/23 0325  NA 145  --   --  139  --  140 138 145  K 4.1  --   --  3.7  --  3.0* 2.8* 4.1  CL 103  --   --  101  --  101 101 106  CO2 29  --   --  28  --  28 28 28   GLUCOSE 383*  --   --  312*  --  304* 283* 228*  BUN 40*  --   --  43*  --  29* 17 18  CREATININE 0.70  --   --  0.63  --  0.43* 0.36* 0.31*   CALCIUM 8.6*  --   --  8.8*  --  8.8* 8.5* 8.9  MG 2.1   < > 2.3 2.3 2.1 2.0 1.8 1.8  PHOS 3.9   < > 3.0 2.7 1.6* 2.7 2.5  --    < > = values in this interval not displayed.   GFR: Estimated Creatinine Clearance: 113.5 mL/min (A) (by C-G formula based on SCr of 0.31 mg/dL (L)). Recent Labs  Lab 05/13/23 1636 05/13/23 1857 05/13/23 2303 05/14/23 0328 05/15/23 0558 05/16/23 0548 05/17/23 0849 05/18/23 0325  PROCALCITON 0.48  --   --   --   --   --   --   --   WBC 12.4*  --   --    < > 11.2* 9.5 9.2 8.1  LATICACIDVEN  --  2.1* 2.1*  --   --   --   --   --    < > =  values in this interval not displayed.    Liver Function Tests: Recent Labs  Lab 05/13/23 1636 05/14/23 0328 05/15/23 0558 05/17/23 0849  AST 94* 54* 23 20  ALT 113* 87* 55* 31  ALKPHOS 82 74 72 67  BILITOT 0.3 0.4 0.5 0.2  PROT 8.7* 7.7 7.9 6.8  ALBUMIN 2.5* 2.2* 2.3* 2.0*   Recent Labs  Lab 05/13/23 1636  LIPASE 47  AMYLASE 76   Recent Labs  Lab 05/13/23 2303  AMMONIA 16    ABG    Component Value Date/Time   PHART 7.43 05/13/2023 1933   PCO2ART 51 (H) 05/13/2023 1933   PO2ART 81 (L) 05/13/2023 1933   HCO3 32.7 (H) 05/13/2023 1933   TCO2 30 04/09/2023 1309   O2SAT 95.1 05/13/2023 1933     Coagulation Profile: Recent Labs  Lab 05/13/23 1636  INR 1.1    Cardiac Enzymes: Recent Labs  Lab 05/13/23 1636  CKTOTAL 34*    HbA1C: Hgb A1c MFr Bld  Date/Time Value Ref Range Status  05/13/2023 04:36 PM 8.2 (H) 4.8 - 5.6 % Final    Comment:    (NOTE) Pre diabetes:          5.7%-6.4%  Diabetes:              >6.4%  Glycemic control for   <7.0% adults with diabetes   11/08/2021 06:08 AM 4.9 4.8 - 5.6 % Final    Comment:    (NOTE) Pre diabetes:          5.7%-6.4%  Diabetes:              >6.4%  Glycemic control for   <7.0% adults with diabetes     CBG: Recent Labs  Lab 05/17/23 1539 05/17/23 1957 05/17/23 2335 05/18/23 0318 05/18/23 0753  GLUCAP 217* 241* 205* 212*  197*      Critical care time: I have spent a total of 33 minutes of critical care time today managing the patient's severe sepsis and acute hypoxic respiratory failure requiring endotracheal intubation in the ED independent of time spent during procedures caring for the patient, discussing plan of care with staff at bedside, contacting the patient's family to discuss goals of care, and reviewing the patient's electronic medical record.  The patient's prognosis is poor.  His risk for further complications, clinical deterioration, and death given his critical illness and comorbid medical conditions is quite high.    Melody Comas, MD [facility] Office: [phone]   See Amion for personal pager PCCM on call pager [phone] until 7pm. Please call Elink 7p-7a. [phone]

## 2023-05-18 NOTE — Progress Notes (Signed)
[facility] ADULT ICU REPLACEMENT PROTOCOL   The patient does apply for the [facility] Adult ICU Electrolyte Replacment Protocol based on the criteria listed below:   1.Exclusion criteria: TCTS, ECMO, Dialysis, and Myasthenia Gravis patients 2. Is GFR >/= 30 ml/min? Yes.    Patient's GFR today is >60 3. Is SCr </= 2? Yes.   Patient's SCr is 0.31 mg/dL 4. Did SCr increase >/= 0.5 in 24 hours? No. 5.Pt's weight >40kg  Yes.   6. Abnormal electrolyte(s):   Mg 1.8  7. Electrolytes replaced per protocol 8.  Call MD STAT for K+ </= 2.5, Phos </= 1, or Mag </= 1 Physician:  R. Leota Sauers R Sinia Antosh 05/18/2023 4:44 AM

## 2023-05-18 NOTE — Progress Notes (Signed)
   05/18/23 0808  Adult Ventilator Settings  Vent Mode (S)  PSV;CPAP  FiO2 (%) (S)  30 %  Pressure Support (S)  5 cmH20  PEEP (S)  5 cmH20

## 2023-05-18 NOTE — Procedures (Addendum)
Extubation Procedure Note  Patient Details:   Name: [name] DOB: 1980-06-09 MRN: [id number]   Airway Documentation:    Vent end date: 05/18/23 Vent end time: (S) 1155 (Extubated pt to 6 L HFNC.)   Evaluation  O2 sats: stable throughout and currently acceptable Complications: No apparent complications Patient did tolerate procedure well. Bilateral Breath Sounds: Rhonchi   Positive cuff leak prior to extubation, ETT suctioned, small, clear, pale amts of secretions, oral care completed  Pt extubated to 6 L HFNC, wean FI02 as tolerated.  Yes  Nunzio Cobbs 05/18/2023, 12:00 PM

## 2023-05-19 ENCOUNTER — Inpatient Hospital Stay (HOSPITAL_COMMUNITY): Payer: Medicare Other

## 2023-05-19 DIAGNOSIS — R6520 Severe sepsis without septic shock: Secondary | ICD-10-CM | POA: Diagnosis not present

## 2023-05-19 DIAGNOSIS — A419 Sepsis, unspecified organism: Secondary | ICD-10-CM | POA: Diagnosis not present

## 2023-05-19 LAB — GLUCOSE, CAPILLARY
Glucose-Capillary: 147 mg/dL — ABNORMAL HIGH (ref 70–99)
Glucose-Capillary: 150 mg/dL — ABNORMAL HIGH (ref 70–99)
Glucose-Capillary: 153 mg/dL — ABNORMAL HIGH (ref 70–99)
Glucose-Capillary: 199 mg/dL — ABNORMAL HIGH (ref 70–99)
Glucose-Capillary: 201 mg/dL — ABNORMAL HIGH (ref 70–99)
Glucose-Capillary: 203 mg/dL — ABNORMAL HIGH (ref 70–99)
Glucose-Capillary: 208 mg/dL — ABNORMAL HIGH (ref 70–99)

## 2023-05-19 LAB — BLOOD GAS, ARTERIAL
Acid-Base Excess: 8.2 mmol/L — ABNORMAL HIGH (ref 0.0–2.0)
Bicarbonate: 31.9 mmol/L — ABNORMAL HIGH (ref 20.0–28.0)
Drawn by: 11249
FIO2: 100 %
MECHVT: 430 mL
O2 Saturation: 100 %
PEEP: 5 cmH2O
Patient temperature: 38.3
RATE: 20 {breaths}/min
pCO2 arterial: 42 mm[Hg] (ref 32–48)
pH, Arterial: 7.49 — ABNORMAL HIGH (ref 7.35–7.45)
pO2, Arterial: 426 mm[Hg] — ABNORMAL HIGH (ref 83–108)

## 2023-05-19 LAB — BASIC METABOLIC PANEL
Anion gap: 9 (ref 5–15)
BUN: 21 mg/dL — ABNORMAL HIGH (ref 6–20)
CO2: 27 mmol/L (ref 22–32)
Calcium: 8.8 mg/dL — ABNORMAL LOW (ref 8.9–10.3)
Chloride: 106 mmol/L (ref 98–111)
Creatinine, Ser: <0.3 mg/dL — ABNORMAL LOW (ref 0.61–1.24)
Glucose, Bld: 205 mg/dL — ABNORMAL HIGH (ref 70–99)
Potassium: 3.6 mmol/L (ref 3.5–5.1)
Sodium: 142 mmol/L (ref 135–145)

## 2023-05-19 LAB — MAGNESIUM: Magnesium: 1.9 mg/dL (ref 1.7–2.4)

## 2023-05-19 MED ORDER — FENTANYL BOLUS VIA INFUSION
50.0000 ug | INTRAVENOUS | Status: DC | PRN
Start: 2023-05-19 — End: 2023-05-23
  Administered 2023-05-20 (×2): 100 ug via INTRAVENOUS
  Administered 2023-05-22: 50 ug via INTRAVENOUS
  Administered 2023-05-22 (×5): 100 ug via INTRAVENOUS
  Administered 2023-05-22 (×3): 50 ug via INTRAVENOUS

## 2023-05-19 MED ORDER — FENTANYL CITRATE PF 50 MCG/ML IJ SOSY
PREFILLED_SYRINGE | INTRAMUSCULAR | Status: AC
  Administered 2023-05-19: 50 ug via INTRAVENOUS
  Filled 2023-05-19: qty 2

## 2023-05-19 MED ORDER — POLYETHYLENE GLYCOL 3350 17 G PO PACK
17.0000 g | PACK | Freq: Every day | ORAL | Status: DC
  Administered 2023-05-20 – 2023-05-23 (×2): 17 g
  Filled 2023-05-19 (×3): qty 1

## 2023-05-19 MED ORDER — DEXMEDETOMIDINE HCL IN NACL 200 MCG/50ML IV SOLN
0.0000 ug/kg/h | INTRAVENOUS | Status: DC
  Administered 2023-05-19 – 2023-05-22 (×8): 0.4 ug/kg/h via INTRAVENOUS
  Administered 2023-05-22 – 2023-05-23 (×3): 0.6 ug/kg/h via INTRAVENOUS
  Filled 2023-05-19 (×13): qty 50

## 2023-05-19 MED ORDER — DOCUSATE SODIUM 50 MG/5ML PO LIQD: 100.0000 mg | Freq: Two times a day (BID) | ORAL | Status: DC

## 2023-05-19 MED ORDER — HYDRALAZINE HCL 20 MG/ML IJ SOLN
10.0000 mg | Freq: Four times a day (QID) | INTRAMUSCULAR | Status: DC | PRN
  Administered 2023-05-19 – 2023-05-23 (×2): 10 mg via INTRAVENOUS
  Filled 2023-05-19 (×2): qty 1

## 2023-05-19 MED ORDER — ETOMIDATE 2 MG/ML IV SOLN: 20.0000 mg | Freq: Once | INTRAVENOUS | Status: AC

## 2023-05-19 MED ORDER — FENTANYL CITRATE PF 50 MCG/ML IJ SOSY
50.0000 ug | PREFILLED_SYRINGE | Freq: Once | INTRAMUSCULAR | Status: AC
Start: 2023-05-19 — End: 2023-05-19

## 2023-05-19 MED ORDER — FENTANYL CITRATE PF 50 MCG/ML IJ SOSY
50.0000 ug | PREFILLED_SYRINGE | Freq: Once | INTRAMUSCULAR | Status: AC
  Administered 2023-05-19: 50 ug via INTRAVENOUS
  Filled 2023-05-19: qty 1

## 2023-05-19 MED ORDER — MIDAZOLAM HCL 2 MG/2ML IJ SOLN: 2.0000 mg | Freq: Once | INTRAMUSCULAR | Status: AC

## 2023-05-19 MED ORDER — ROCURONIUM BROMIDE 10 MG/ML (PF) SYRINGE
PREFILLED_SYRINGE | INTRAVENOUS | Status: AC
  Administered 2023-05-19: 50 mg via INTRAVENOUS
  Filled 2023-05-19: qty 10

## 2023-05-19 MED ORDER — CLONAZEPAM 0.5 MG PO TBDP
1.0000 mg | ORAL_TABLET | Freq: Two times a day (BID) | ORAL | Status: DC
  Administered 2023-05-19 – 2023-05-24 (×9): 1 mg
  Filled 2023-05-19 (×9): qty 2

## 2023-05-19 MED ORDER — OXYCODONE HCL 5 MG PO TABS
5.0000 mg | ORAL_TABLET | Freq: Four times a day (QID) | ORAL | Status: DC | PRN
  Administered 2023-05-19 – 2023-05-23 (×2): 5 mg
  Filled 2023-05-19 (×2): qty 1

## 2023-05-19 MED ORDER — ROCURONIUM BROMIDE 10 MG/ML (PF) SYRINGE: 50.0000 mg | PREFILLED_SYRINGE | Freq: Once | INTRAVENOUS | Status: AC

## 2023-05-19 MED ORDER — FENTANYL CITRATE (PF) 100 MCG/2ML IJ SOLN
INTRAMUSCULAR | Status: AC
  Filled 2023-05-19: qty 2

## 2023-05-19 MED ORDER — FENTANYL 2500MCG IN NS 250ML (10MCG/ML) PREMIX INFUSION
50.0000 ug/h | INTRAVENOUS | Status: DC
  Administered 2023-05-19 – 2023-05-21 (×2): 50 ug/h via INTRAVENOUS
  Administered 2023-05-22: 100 ug/h via INTRAVENOUS
  Filled 2023-05-19 (×2): qty 250

## 2023-05-19 MED ORDER — ETOMIDATE 2 MG/ML IV SOLN
INTRAVENOUS | Status: AC
  Administered 2023-05-19: 20 mg via INTRAVENOUS
  Filled 2023-05-19: qty 20

## 2023-05-19 MED ORDER — MIDAZOLAM HCL 2 MG/2ML IJ SOLN
INTRAMUSCULAR | Status: AC
  Administered 2023-05-19: 2 mg via INTRAVENOUS
  Filled 2023-05-19: qty 2

## 2023-05-19 MED ORDER — CLONAZEPAM 0.1 MG/ML ORAL SUSPENSION: 1.0000 mg | Freq: Two times a day (BID) | ORAL | Status: DC

## 2023-05-19 NOTE — Progress Notes (Signed)
RT note: CPT done via bed at this time, Pt. tolerated well.

## 2023-05-19 NOTE — Plan of Care (Signed)
  Problem: Education: Goal: Ability to describe self-care measures that may prevent or decrease complications (Diabetes Survival Skills Education) will improve Outcome: Progressing Goal: Individualized Educational Video(s) Outcome: Progressing   Problem: Coping: Goal: Ability to adjust to condition or change in health will improve Outcome: Progressing   Problem: Fluid Volume: Goal: Ability to maintain a balanced intake and output will improve Outcome: Progressing   Problem: Health Behavior/Discharge Planning: Goal: Ability to identify and utilize available resources and services will improve Outcome: Progressing Goal: Ability to manage health-related needs will improve Outcome: Progressing   Problem: Metabolic: Goal: Ability to maintain appropriate glucose levels will improve Outcome: Progressing   Problem: Nutritional: Goal: Maintenance of adequate nutrition will improve Outcome: Progressing Goal: Progress toward achieving an optimal weight will improve Outcome: Progressing   Problem: Skin Integrity: Goal: Risk for impaired skin integrity will decrease Outcome: Progressing   Problem: Tissue Perfusion: Goal: Adequacy of tissue perfusion will improve Outcome: Progressing   Problem: Activity: Goal: Ability to tolerate increased activity will improve Outcome: Progressing   Problem: Respiratory: Goal: Ability to maintain a clear airway and adequate ventilation will improve Outcome: Progressing   Problem: Role Relationship: Goal: Method of communication will improve Outcome: Progressing   Problem: Education: Goal: Knowledge of General Education information will improve Description: Including pain rating scale, medication(s)/side effects and non-pharmacologic comfort measures Outcome: Progressing   Problem: Health Behavior/Discharge Planning: Goal: Ability to manage health-related needs will improve Outcome: Progressing   Problem: Clinical Measurements: Goal:  Ability to maintain clinical measurements within normal limits will improve Outcome: Progressing Goal: Will remain free from infection Outcome: Progressing Goal: Diagnostic test results will improve Outcome: Progressing Goal: Respiratory complications will improve Outcome: Progressing Goal: Cardiovascular complication will be avoided Outcome: Progressing   Problem: Activity: Goal: Risk for activity intolerance will decrease Outcome: Progressing   Problem: Nutrition: Goal: Adequate nutrition will be maintained Outcome: Progressing   Problem: Coping: Goal: Level of anxiety will decrease Outcome: Progressing   Problem: Elimination: Goal: Will not experience complications related to bowel motility Outcome: Progressing Goal: Will not experience complications related to urinary retention Outcome: Progressing   Problem: Pain Management: Goal: General experience of comfort will improve Outcome: Progressing   Problem: Safety: Goal: Ability to remain free from injury will improve Outcome: Progressing   Problem: Skin Integrity: Goal: Risk for impaired skin integrity will decrease Outcome: Progressing   Problem: Activity: Goal: Ability to tolerate increased activity will improve Outcome: Progressing   Problem: Respiratory: Goal: Ability to maintain a clear airway and adequate ventilation will improve Outcome: Progressing

## 2023-05-19 NOTE — Progress Notes (Signed)
NAME:  [name], MRN:  [id number], DOB:  [birth year], LOS: 6 ADMISSION DATE:  05/13/2023, CONSULTATION DATE:  05/19/23 REFERRING MD:  Beckey Downing, M.D. CHIEF COMPLAINT:  "Altered Mental Status"  History of Present Illness:  43 y.o. male with end-stage multiple sclerosis and sacral osteomyelitis.  Presented to the emergency department today with altered mental status and fever.  Patient currently resides at a skilled nursing facility and with these changes EMS transported the patient to the ED for evaluation.  Normally patient is able to speak 1 word at a time.  Previously patient was on hospice but family decided so back to a Full Code Status.  Review of the electronic medical record indicates patient was admitted from 12/9 through 04/13/2023 for osteomyelitis of the coccyx and sacrum as well as a stage IV sacral decubitus ulcer.  Patient was treated with antibiotics which were stopped at the time of discharge.  Palliative Care Medicine was planned to continue following at that time.  Following arrival patient was evaluated and admitted by hospitalist service.  Shortly after the admission ED provider was called to the patient's room with hypoxia and respiratory distress.  Patient under went bag mask ventilation and then was placed on BiPAP for respiratory support.  After contacting the patient's family/brother the patient was intubated this evening in the emergency department by the ED provider.  With concern for sepsis patient was given bolus IV fluid with lactated Ringer's and empiric antibiotic therapy with vancomycin, Flagyl, and cefepime. I personally spoke with the patient's bedside nurse and he remains febrile despite receiving Tylenol. Patient notably still has a sacral decubitus ulcer and the beginning of another ulceration on his gluteus noted by nursing staff.  Pertinent  Medical History  N/A  Significant Hospital Events: Including procedures, antibiotic start and stop dates in addition to  other pertinent events   01/12 - Presented to ED & family revoked hospice status >> intubated by EDP >> PCCM consulted for admission 1/15 PSV trial for a couple of hours, copious secretions 1/17 extubated  Interim History / Subjective:   He has done well after extubation Has strong cough   Objective   Blood pressure (!) 141/120, pulse (!) 114, temperature 99.2 F (37.3 C), temperature source Oral, resp. rate 19, height 5\' 10"  (1.778 m), weight 71 kg, SpO2 99%.    Vent Mode: PSV;CPAP FiO2 (%):  [30 %-54 %] 36 % PEEP:  [5 cmH20] 5 cmH20 Pressure Support:  [5 cmH20] 5 cmH20   Intake/Output Summary (Last 24 hours) at 05/19/2023 1030 Last data filed at 05/19/2023 0951 Gross per 24 hour  Intake 1811.15 ml  Output 700 ml  Net 1111.15 ml   Filed Weights   05/17/23 0424 05/18/23 0416 05/19/23 0555  Weight: 65 kg 66.7 kg 71 kg    Examination: General: Thin, cachectic male.  No acute distress.  Integument:  Warm & dry. Wounds present on admission Extremities:  No cyanosis or clubbing.  Lymphatics:  No appreciated cervical or supraclavicular lymphadenopathy. HEENT:  Moist mucus membranes.  Cardiovascular:  tachycardic. No edema or JVD appreciated. No murmur appreciated. Pulmonary:  clear to auscultation, thick secretions noted Abdomen: Soft. Nondistended.  PEG tube in place. Musculoskeletal: Symmetrically decreased muscle bulk. No joint deformity or effusion appreciated. Neurological:  able to nod yes/no. Moving left hand  Resolved Hospital Problem list   Lactic Acidosis  Assessment & Plan:   Acute Hypoxemic respiratory Failure Pneumonia, right lung - extuabted 1/17 - high risk for re-intubation  due to neuromuscular weakness - continue zosyn and doxycycline for pneumonia coverage - tracheal aspirate with gram positive cocci and gram negative rods. Pseudomonas growing.  Severe Sepsis In setting of pneumonia and possible UTI - continue antibiotics as above - remains  tachycardic  Acute Metabolic Encephalopathy - due to sepsis - monitor, improving  Sacral Decubitus Ulcer, POA - wound care consult  Elevated Liver Enzymes - Monitor - Abdominal US WNL  DMII Hyperglycemia - SSI, q4hr glucose checks  End Stage Multiple Sclerosis - generalized weakness and multiple skin ulcerations and wounds from pressure injuries present on admission - will consult palliative care for assistance with goals of care discussions   Best Practice (right click and "Reselect all SmartList Selections" daily)   Diet/type: tubefeeds DVT prophylaxis SCD Pressure ulcer(s): present on admission  GI prophylaxis: H2B Lines: yes and it is still needed Foley:  Yes, and it is still needed Code Status:  full code Last date of multidisciplinary goals of care discussion [1/16, talked with [name] - brother. Plan to consult palliative care to arrange family meeting and talk long term goals of care]  Labs   CBC: Recent Labs  Lab 05/13/23 1636 05/14/23 0328 05/15/23 0558 05/16/23 0548 05/17/23 0849 05/18/23 0325  WBC 12.4* 12.8* 11.2* 9.5 9.2 8.1  NEUTROABS 10.5* 10.6*  --   --  7.4  --   HGB 11.2* 10.4* 9.5* 8.2* 8.5* 8.3*  HCT 36.0* 35.0* 31.3* 27.5* 28.2* 28.7*  MCV 87.4 89.5 88.4 89.9 87.6 91.7  PLT 550* 499* 396 338 351 321    Basic Metabolic Panel: Recent Labs  Lab 05/14/23 1712 05/15/23 0558 05/15/23 1701 05/16/23 0548 05/17/23 0849 05/18/23 0325 05/19/23 0242  NA  --  139  --  140 138 145 142  K  --  3.7  --  3.0* 2.8* 4.1 3.6  CL  --  101  --  101 101 106 106  CO2  --  28  --  28 28 28 27   GLUCOSE  --  312*  --  304* 283* 228* 205*  BUN  --  43*  --  29* 17 18 21*  CREATININE  --  0.63  --  0.43* 0.36* 0.31* <0.30*  CALCIUM  --  8.8*  --  8.8* 8.5* 8.9 8.8*  MG 2.3 2.3 2.1 2.0 1.8 1.8 1.9  PHOS 3.0 2.7 1.6* 2.7 2.5  --   --    GFR: CrCl cannot be calculated (This lab value cannot be used to calculate CrCl because it is not a number:  <0.30). Recent Labs  Lab 05/13/23 1636 05/13/23 1857 05/13/23 2303 05/14/23 0328 05/15/23 0558 05/16/23 0548 05/17/23 0849 05/18/23 0325  PROCALCITON 0.48  --   --   --   --   --   --   --   WBC 12.4*  --   --    < > 11.2* 9.5 9.2 8.1  LATICACIDVEN  --  2.1* 2.1*  --   --   --   --   --    < > = values in this interval not displayed.    Liver Function Tests: Recent Labs  Lab 05/13/23 1636 05/14/23 0328 05/15/23 0558 05/17/23 0849  AST 94* 54* 23 20  ALT 113* 87* 55* 31  ALKPHOS 82 74 72 67  BILITOT 0.3 0.4 0.5 0.2  PROT 8.7* 7.7 7.9 6.8  ALBUMIN 2.5* 2.2* 2.3* 2.0*   Recent Labs  Lab 05/13/23 1636  LIPASE 47  AMYLASE 76   Recent Labs  Lab 05/13/23 2303  AMMONIA 16    ABG    Component Value Date/Time   PHART 7.43 05/13/2023 1933   PCO2ART 51 (H) 05/13/2023 1933   PO2ART 81 (L) 05/13/2023 1933   HCO3 32.7 (H) 05/13/2023 1933   TCO2 30 04/09/2023 1309   O2SAT 95.1 05/13/2023 1933     Coagulation Profile: Recent Labs  Lab 05/13/23 1636  INR 1.1    Cardiac Enzymes: Recent Labs  Lab 05/13/23 1636  CKTOTAL 34*    HbA1C: Hgb A1c MFr Bld  Date/Time Value Ref Range Status  05/13/2023 04:36 PM 8.2 (H) 4.8 - 5.6 % Final    Comment:    (NOTE) Pre diabetes:          5.7%-6.4%  Diabetes:              >6.4%  Glycemic control for   <7.0% adults with diabetes   11/08/2021 06:08 AM 4.9 4.8 - 5.6 % Final    Comment:    (NOTE) Pre diabetes:          5.7%-6.4%  Diabetes:              >6.4%  Glycemic control for   <7.0% adults with diabetes     CBG: Recent Labs  Lab 05/18/23 1931 05/18/23 2343 05/19/23 0318 05/19/23 0734 05/19/23 0926  GLUCAP 147* 155* 201* 153* 203*      Melody Comas, MD [facility] Office: [phone]   See Amion for personal pager PCCM on call pager [phone] until 7pm. Please call Elink 7p-7a. [phone]

## 2023-05-19 NOTE — Procedures (Signed)
Intubation Procedure Note  [name]  [id number]  Jun 23, 1980  Date:05/19/23  Time:6:45 PM   Provider Performing:Elaysha Bevard B Constance Hackenberg    Procedure: Intubation (31500)  Indication(s) Respiratory Failure  Consent Unable to obtain consent due to emergent nature of procedure.   Anesthesia Etomidate, Versed, Fentanyl, and Rocuronium   Time Out Verified patient identification, verified procedure, site/side was marked, verified correct patient position, special equipment/implants available, medications/allergies/relevant history reviewed, required imaging and test results available.   Sterile Technique Usual hand hygeine, masks, and gloves were used   Procedure Description Patient positioned in bed supine.  Sedation given as noted above.  Patient was intubated with endotracheal tube using Glidescope.  View was Grade 1 full glottis .  Number of attempts was 1.  Colorimetric CO2 detector was consistent with tracheal placement.   Complications/Tolerance None; patient tolerated the procedure well. Chest X-ray is ordered to verify placement.   EBL Minimal   Specimen(s) None

## 2023-05-19 NOTE — Progress Notes (Signed)
CPT done thru the bed. Patient tolerated well

## 2023-05-19 NOTE — Plan of Care (Signed)
Patient remains off vent, on 4 liter via HFNC, tolerating tube feeds, foley patent, IV lines patent and flushed, no signs of distress at this time, patient handbook/guide at bedside.  Problem: Education: Goal: Ability to describe self-care measures that may prevent or decrease complications (Diabetes Survival Skills Education) will improve Outcome: Progressing Goal: Individualized Educational Video(s) Outcome: Progressing   Problem: Coping: Goal: Ability to adjust to condition or change in health will improve Outcome: Progressing   Problem: Fluid Volume: Goal: Ability to maintain a balanced intake and output will improve Outcome: Progressing   Problem: Health Behavior/Discharge Planning: Goal: Ability to identify and utilize available resources and services will improve Outcome: Progressing Goal: Ability to manage health-related needs will improve Outcome: Progressing   Problem: Metabolic: Goal: Ability to maintain appropriate glucose levels will improve Outcome: Progressing   Problem: Nutritional: Goal: Maintenance of adequate nutrition will improve Outcome: Progressing Goal: Progress toward achieving an optimal weight will improve Outcome: Progressing   Problem: Skin Integrity: Goal: Risk for impaired skin integrity will decrease Outcome: Progressing   Problem: Tissue Perfusion: Goal: Adequacy of tissue perfusion will improve Outcome: Progressing   Problem: Activity: Goal: Ability to tolerate increased activity will improve Outcome: Progressing   Problem: Respiratory: Goal: Ability to maintain a clear airway and adequate ventilation will improve Outcome: Progressing   Problem: Role Relationship: Goal: Method of communication will improve Outcome: Progressing   Problem: Education: Goal: Knowledge of General Education information will improve Description: Including pain rating scale, medication(s)/side effects and non-pharmacologic comfort measures Outcome:  Progressing   Problem: Health Behavior/Discharge Planning: Goal: Ability to manage health-related needs will improve Outcome: Progressing   Problem: Clinical Measurements: Goal: Ability to maintain clinical measurements within normal limits will improve Outcome: Progressing Goal: Will remain free from infection Outcome: Progressing Goal: Diagnostic test results will improve Outcome: Progressing Goal: Respiratory complications will improve Outcome: Progressing Goal: Cardiovascular complication will be avoided Outcome: Progressing   Problem: Activity: Goal: Risk for activity intolerance will decrease Outcome: Progressing   Problem: Nutrition: Goal: Adequate nutrition will be maintained Outcome: Progressing   Problem: Coping: Goal: Level of anxiety will decrease Outcome: Progressing   Problem: Elimination: Goal: Will not experience complications related to bowel motility Outcome: Progressing Goal: Will not experience complications related to urinary retention Outcome: Progressing   Problem: Pain Management: Goal: General experience of comfort will improve Outcome: Progressing   Problem: Safety: Goal: Ability to remain free from injury will improve Outcome: Progressing   Problem: Skin Integrity: Goal: Risk for impaired skin integrity will decrease Outcome: Progressing

## 2023-05-19 NOTE — Progress Notes (Signed)
RT note: CPT via Bed not done @0000 /0400, on 05/19/2023 due to X pt. admissions with Arterial Line Order/Placement/Rapid Response with BiPAP placement admitted to ICU/SD.

## 2023-05-20 DIAGNOSIS — A419 Sepsis, unspecified organism: Secondary | ICD-10-CM | POA: Diagnosis not present

## 2023-05-20 DIAGNOSIS — R6520 Severe sepsis without septic shock: Secondary | ICD-10-CM | POA: Diagnosis not present

## 2023-05-20 LAB — GLUCOSE, CAPILLARY
Glucose-Capillary: 182 mg/dL — ABNORMAL HIGH (ref 70–99)
Glucose-Capillary: 198 mg/dL — ABNORMAL HIGH (ref 70–99)
Glucose-Capillary: 172 mg/dL — ABNORMAL HIGH (ref 70–99)
Glucose-Capillary: 173 mg/dL — ABNORMAL HIGH (ref 70–99)
Glucose-Capillary: 205 mg/dL — ABNORMAL HIGH (ref 70–99)
Glucose-Capillary: 158 mg/dL — ABNORMAL HIGH (ref 70–99)

## 2023-05-20 MED ORDER — ORAL CARE MOUTH RINSE
15.0000 mL | OROMUCOSAL | Status: DC
  Administered 2023-05-20 – 2023-05-24 (×55): 15 mL via OROMUCOSAL

## 2023-05-20 MED ORDER — ORAL CARE MOUTH RINSE: 15.0000 mL | OROMUCOSAL | Status: DC | PRN

## 2023-05-20 NOTE — Progress Notes (Signed)
NAME:  [name], MRN:  [id number], DOB:  1980-12-04, LOS: 7 ADMISSION DATE:  05/13/2023, CONSULTATION DATE:  05/20/23 REFERRING MD:  Beckey Downing, M.D. CHIEF COMPLAINT:  "Altered Mental Status"  History of Present Illness:  43 y.o. male with end-stage multiple sclerosis and sacral osteomyelitis.  Presented to the emergency department today with altered mental status and fever.  Patient currently resides at a skilled nursing facility and with these changes EMS transported the patient to the ED for evaluation.  Normally patient is able to speak 1 word at a time.  Previously patient was on hospice but family decided so back to a Full Code Status.  Review of the electronic medical record indicates patient was admitted from 12/9 through 04/13/2023 for osteomyelitis of the coccyx and sacrum as well as a stage IV sacral decubitus ulcer.  Patient was treated with antibiotics which were stopped at the time of discharge.  Palliative Care Medicine was planned to continue following at that time.  Following arrival patient was evaluated and admitted by hospitalist service.  Shortly after the admission ED provider was called to the patient's room with hypoxia and respiratory distress.  Patient under went bag mask ventilation and then was placed on BiPAP for respiratory support.  After contacting the patient's family/brother the patient was intubated this evening in the emergency department by the ED provider.  With concern for sepsis patient was given bolus IV fluid with lactated Ringer's and empiric antibiotic therapy with vancomycin, Flagyl, and cefepime. I personally spoke with the patient's bedside nurse and he remains febrile despite receiving Tylenol. Patient notably still has a sacral decubitus ulcer and the beginning of another ulceration on his gluteus noted by nursing staff.  Pertinent  Medical History  N/A  Significant Hospital Events: Including procedures, antibiotic start and stop dates in addition to  other pertinent events   01/12 - Presented to ED & family revoked hospice status >> intubated by EDP >> PCCM consulted for admission 1/15 PSV trial for a couple of hours, copious secretions 1/17 extubated 1/18 re-intubated due to increased HR, work of breathing and increased secretions  Interim History / Subjective:   Reintubated yesterday Awake and no apparent distress   Objective   Blood pressure 138/89, pulse (!) 117, temperature 99.8 F (37.7 C), temperature source Axillary, resp. rate (!) 28, height 5\' 10"  (1.778 m), weight 69.2 kg, SpO2 100%.    Vent Mode: PRVC FiO2 (%):  [40 %-100 %] 40 % Set Rate:  [20 bmp] 20 bmp Vt Set:  [430 mL] 430 mL PEEP:  [5 cmH20] 5 cmH20 Plateau Pressure:  [13 cmH20-16 cmH20] 13 cmH20   Intake/Output Summary (Last 24 hours) at 05/20/2023 0901 Last data filed at 05/20/2023 0800 Gross per 24 hour  Intake 1659.73 ml  Output 2030 ml  Net -370.27 ml   Filed Weights   05/18/23 0416 05/19/23 0555 05/20/23 0600  Weight: 66.7 kg 71 kg 69.2 kg    Examination: General: Thin, cachectic male.  No acute distress. Intubated Integument:  Warm & dry. Wounds present on admission Extremities:  No cyanosis or clubbing.  Lymphatics:  No appreciated cervical or supraclavicular lymphadenopathy. HEENT:  Moist mucus membranes.  Cardiovascular:  rrr. No edema or JVD appreciated. No murmur appreciated. Pulmonary:  course breath sounds Abdomen: Soft. Nondistended.  PEG tube in place. Musculoskeletal: Symmetrically decreased muscle bulk. No joint deformity or effusion appreciated. Neurological:  able to nod yes/no. Moving left hand  Resolved Hospital Problem list   Lactic  Acidosis Acute Metabolic Encephalopathy  Assessment & Plan:   Acute Hypoxemic respiratory Failure in setting of neuromuscular disorder Pneumonia, right lung - extuabted 1/17, reintubated 1/18 - VAP bundle - continue mechanical ventilator support, 6cc/kg tidal volume - continue zosyn and  doxycycline for pneumonia coverage  - tracheal aspirate with gram positive cocci and gram negative rods. Pseudomonas growing. - consider glycopyrolate for secretion management  Severe Sepsis In setting of pneumonia and possible UTI - continue antibiotics as above  Sacral Decubitus Ulcer, POA - wound care consult  Elevated Liver Enzymes - Monitor - Abdominal US WNL  DMII Hyperglycemia - SSI, q4hr glucose checks  End Stage Multiple Sclerosis - generalized weakness and multiple skin ulcerations and wounds from pressure injuries present on admission - Will likely require tracheostomy and long term vent support due to the progression of his neuromuscular disease. - Patient's family need to assist with decision making, whether patient is transitioned to comfort care or would want tracheostomy  Moderate Protein Calorie Malnutrition - tube feeds  Best Practice (right click and "Reselect all SmartList Selections" daily)   Diet/type: tubefeeds DVT prophylaxis SCD Pressure ulcer(s): present on admission  GI prophylaxis: H2B Lines: yes and it is still needed Foley:  Yes, and it is still needed Code Status:  full code Last date of multidisciplinary goals of care discussion [1/18, talked with [name] - brother. Update provided, plan to meet with palliative care and patient's daughter along with his brother to formulate long term plan]  Labs   CBC: Recent Labs  Lab 05/13/23 1636 05/14/23 0328 05/15/23 0558 05/16/23 0548 05/17/23 0849 05/18/23 0325  WBC 12.4* 12.8* 11.2* 9.5 9.2 8.1  NEUTROABS 10.5* 10.6*  --   --  7.4  --   HGB 11.2* 10.4* 9.5* 8.2* 8.5* 8.3*  HCT 36.0* 35.0* 31.3* 27.5* 28.2* 28.7*  MCV 87.4 89.5 88.4 89.9 87.6 91.7  PLT 550* 499* 396 338 351 321    Basic Metabolic Panel: Recent Labs  Lab 05/14/23 1712 05/15/23 0558 05/15/23 1701 05/16/23 0548 05/17/23 0849 05/18/23 0325 05/19/23 0242  NA  --  139  --  140 138 145 142  K  --  3.7  --  3.0* 2.8*  4.1 3.6  CL  --  101  --  101 101 106 106  CO2  --  28  --  28 28 28 27   GLUCOSE  --  312*  --  304* 283* 228* 205*  BUN  --  43*  --  29* 17 18 21*  CREATININE  --  0.63  --  0.43* 0.36* 0.31* <0.30*  CALCIUM  --  8.8*  --  8.8* 8.5* 8.9 8.8*  MG 2.3 2.3 2.1 2.0 1.8 1.8 1.9  PHOS 3.0 2.7 1.6* 2.7 2.5  --   --    GFR: CrCl cannot be calculated (This lab value cannot be used to calculate CrCl because it is not a number: <0.30). Recent Labs  Lab 05/13/23 1636 05/13/23 1857 05/13/23 2303 05/14/23 0328 05/15/23 0558 05/16/23 0548 05/17/23 0849 05/18/23 0325  PROCALCITON 0.48  --   --   --   --   --   --   --   WBC 12.4*  --   --    < > 11.2* 9.5 9.2 8.1  LATICACIDVEN  --  2.1* 2.1*  --   --   --   --   --    < > = values in this interval not displayed.  Liver Function Tests: Recent Labs  Lab 05/13/23 1636 05/14/23 0328 05/15/23 0558 05/17/23 0849  AST 94* 54* 23 20  ALT 113* 87* 55* 31  ALKPHOS 82 74 72 67  BILITOT 0.3 0.4 0.5 0.2  PROT 8.7* 7.7 7.9 6.8  ALBUMIN 2.5* 2.2* 2.3* 2.0*   Recent Labs  Lab 05/13/23 1636  LIPASE 47  AMYLASE 76   Recent Labs  Lab 05/13/23 2303  AMMONIA 16    ABG    Component Value Date/Time   PHART 7.49 (H) 05/19/2023 1940   PCO2ART 42 05/19/2023 1940   PO2ART 426 (H) 05/19/2023 1940   HCO3 31.9 (H) 05/19/2023 1940   TCO2 30 04/09/2023 1309   O2SAT 100 05/19/2023 1940     Coagulation Profile: Recent Labs  Lab 05/13/23 1636  INR 1.1    Cardiac Enzymes: Recent Labs  Lab 05/13/23 1636  CKTOTAL 34*    HbA1C: Hgb A1c MFr Bld  Date/Time Value Ref Range Status  05/13/2023 04:36 PM 8.2 (H) 4.8 - 5.6 % Final    Comment:    (NOTE) Pre diabetes:          5.7%-6.4%  Diabetes:              >6.4%  Glycemic control for   <7.0% adults with diabetes   11/08/2021 06:08 AM 4.9 4.8 - 5.6 % Final    Comment:    (NOTE) Pre diabetes:          5.7%-6.4%  Diabetes:              >6.4%  Glycemic control for    <7.0% adults with diabetes     CBG: Recent Labs  Lab 05/19/23 1547 05/19/23 1923 05/19/23 2331 05/20/23 0317 05/20/23 0722  GLUCAP 199* 150* 147* 182* 198*    CC time 35 minutes  Melody Comas, MD [facility] Office: [phone]   See Amion for personal pager PCCM on call pager [phone] until 7pm. Please call Elink 7p-7a. [phone]

## 2023-05-20 NOTE — Plan of Care (Signed)
Patient remains on ventilator at this time, tolerating it well, his secretions has decreased, patient tolerating tube feedings, blood glucose levels have been with a good range with the sliding scale coverage, patient has both foley and a Flexiseal which is helping the healing of his stage 4-5 sacral wound, dressing are being done daily and PRN. Patient response to voice and is able to follow simple commands. Problem: Education: Goal: Individualized Educational Video(s) Outcome: Progressing   Problem: Coping: Goal: Ability to adjust to condition or change in health will improve Outcome: Progressing   Problem: Fluid Volume: Goal: Ability to maintain a balanced intake and output will improve Outcome: Progressing   Problem: Metabolic: Goal: Ability to maintain appropriate glucose levels will improve Outcome: Progressing   Problem: Nutritional: Goal: Maintenance of adequate nutrition will improve Outcome: Progressing Goal: Progress toward achieving an optimal weight will improve Outcome: Progressing   Problem: Skin Integrity: Goal: Risk for impaired skin integrity will decrease Outcome: Progressing   Problem: Tissue Perfusion: Goal: Adequacy of tissue perfusion will improve Outcome: Progressing   Problem: Activity: Goal: Ability to tolerate increased activity will improve Outcome: Progressing   Problem: Respiratory: Goal: Ability to maintain a clear airway and adequate ventilation will improve Outcome: Progressing   Problem: Role Relationship: Goal: Method of communication will improve Outcome: Progressing   Problem: Education: Goal: Knowledge of General Education information will improve Description: Including pain rating scale, medication(s)/side effects and non-pharmacologic comfort measures Outcome: Progressing   Problem: Health Behavior/Discharge Planning: Goal: Ability to manage health-related needs will improve Outcome: Progressing   Problem: Clinical  Measurements: Goal: Ability to maintain clinical measurements within normal limits will improve Outcome: Progressing Goal: Will remain free from infection Outcome: Progressing Goal: Diagnostic test results will improve Outcome: Progressing Goal: Respiratory complications will improve Outcome: Progressing Goal: Cardiovascular complication will be avoided Outcome: Progressing   Problem: Activity: Goal: Risk for activity intolerance will decrease Outcome: Progressing   Problem: Nutrition: Goal: Adequate nutrition will be maintained Outcome: Progressing   Problem: Coping: Goal: Level of anxiety will decrease Outcome: Progressing   Problem: Elimination: Goal: Will not experience complications related to bowel motility Outcome: Progressing Goal: Will not experience complications related to urinary retention Outcome: Progressing   Problem: Pain Management: Goal: General experience of comfort will improve Outcome: Progressing   Problem: Safety: Goal: Ability to remain free from injury will improve Outcome: Progressing   Problem: Skin Integrity: Goal: Risk for impaired skin integrity will decrease Outcome: Progressing   Problem: Activity: Goal: Ability to tolerate increased activity will improve Outcome: Progressing   Problem: Respiratory: Goal: Ability to maintain a clear airway and adequate ventilation will improve Outcome: Progressing

## 2023-05-21 ENCOUNTER — Inpatient Hospital Stay (HOSPITAL_COMMUNITY): Payer: Medicare Other

## 2023-05-21 DIAGNOSIS — J189 Pneumonia, unspecified organism: Secondary | ICD-10-CM | POA: Diagnosis not present

## 2023-05-21 DIAGNOSIS — Z7189 Other specified counseling: Secondary | ICD-10-CM

## 2023-05-21 DIAGNOSIS — A419 Sepsis, unspecified organism: Secondary | ICD-10-CM | POA: Diagnosis not present

## 2023-05-21 LAB — CBC
HCT: 26.6 % — ABNORMAL LOW (ref 39.0–52.0)
Hemoglobin: 7.7 g/dL — ABNORMAL LOW (ref 13.0–17.0)
MCH: 26.4 pg (ref 26.0–34.0)
MCHC: 28.9 g/dL — ABNORMAL LOW (ref 30.0–36.0)
MCV: 91.1 fL (ref 80.0–100.0)
Platelets: 332 10*3/uL (ref 150–400)
RBC: 2.92 MIL/uL — ABNORMAL LOW (ref 4.22–5.81)
RDW: 17.2 % — ABNORMAL HIGH (ref 11.5–15.5)
WBC: 6.6 10*3/uL (ref 4.0–10.5)
nRBC: 0.5 % — ABNORMAL HIGH (ref 0.0–0.2)

## 2023-05-21 LAB — BASIC METABOLIC PANEL
Anion gap: 3 — ABNORMAL LOW (ref 5–15)
BUN: 24 mg/dL — ABNORMAL HIGH (ref 6–20)
CO2: 29 mmol/L (ref 22–32)
Calcium: 8.6 mg/dL — ABNORMAL LOW (ref 8.9–10.3)
Chloride: 109 mmol/L (ref 98–111)
Creatinine, Ser: <0.3 mg/dL — ABNORMAL LOW (ref 0.61–1.24)
Glucose, Bld: 139 mg/dL — ABNORMAL HIGH (ref 70–99)
Potassium: 4.3 mmol/L (ref 3.5–5.1)
Sodium: 141 mmol/L (ref 135–145)

## 2023-05-21 LAB — GLUCOSE, CAPILLARY
Glucose-Capillary: 101 mg/dL — ABNORMAL HIGH (ref 70–99)
Glucose-Capillary: 107 mg/dL — ABNORMAL HIGH (ref 70–99)
Glucose-Capillary: 130 mg/dL — ABNORMAL HIGH (ref 70–99)
Glucose-Capillary: 143 mg/dL — ABNORMAL HIGH (ref 70–99)
Glucose-Capillary: 155 mg/dL — ABNORMAL HIGH (ref 70–99)
Glucose-Capillary: 156 mg/dL — ABNORMAL HIGH (ref 70–99)
Glucose-Capillary: 160 mg/dL — ABNORMAL HIGH (ref 70–99)

## 2023-05-21 LAB — TRIGLYCERIDES: Triglycerides: 60 mg/dL (ref <150)

## 2023-05-21 LAB — PROCALCITONIN: Procalcitonin: <0.1 ng/mL

## 2023-05-21 MED ORDER — FENTANYL CITRATE (PF) 100 MCG/2ML IJ SOLN
200.0000 ug | Freq: Once | INTRAMUSCULAR | Status: DC
  Filled 2023-05-21: qty 4

## 2023-05-21 MED ORDER — ROCURONIUM BROMIDE 10 MG/ML (PF) SYRINGE
100.0000 mg | PREFILLED_SYRINGE | Freq: Once | INTRAVENOUS | Status: AC
  Administered 2023-05-22: 100 mg via INTRAVENOUS
  Filled 2023-05-21: qty 10

## 2023-05-21 MED ORDER — ENOXAPARIN SODIUM 60 MG/0.6ML IJ SOSY
60.0000 mg | PREFILLED_SYRINGE | Freq: Two times a day (BID) | INTRAMUSCULAR | Status: AC
  Administered 2023-05-21: 60 mg via SUBCUTANEOUS
  Filled 2023-05-21: qty 0.6

## 2023-05-21 MED ORDER — MIDAZOLAM HCL 2 MG/2ML IJ SOLN
5.0000 mg | Freq: Once | INTRAMUSCULAR | Status: AC
  Administered 2023-05-22: 4 mg via INTRAVENOUS
  Filled 2023-05-21: qty 6

## 2023-05-21 MED ORDER — ETOMIDATE 2 MG/ML IV SOLN
20.0000 mg | Freq: Once | INTRAVENOUS | Status: AC
  Administered 2023-05-22: 20 mg via INTRAVENOUS
  Filled 2023-05-21: qty 10

## 2023-05-21 MED ORDER — LIDOCAINE-EPINEPHRINE 1 %-1:100000 IJ SOLN
20.0000 mL | Freq: Once | INTRAMUSCULAR | Status: AC
  Administered 2023-05-22: 20 mL via INTRADERMAL
  Filled 2023-05-21: qty 1

## 2023-05-21 MED ORDER — SODIUM CHLORIDE 0.9 % IV SOLN
2.0000 g | Freq: Three times a day (TID) | INTRAVENOUS | Status: DC
  Administered 2023-05-21 – 2023-05-22 (×4): 2 g via INTRAVENOUS
  Filled 2023-05-21 (×5): qty 12.5

## 2023-05-21 NOTE — Progress Notes (Signed)
Pharmacy Antibiotic Note  [name] is a 43 y.o. male admitted on 05/13/2023 with  AMS and fever, found to be pneumonia and UTI . Patient has been on doxycycline and Zosyn therapy since 1/12 for a pseudomonal UTI and pneumonia. Patient continues to fever (Temp 100.58 and 100.76 on 1/20 AM), HR 100s, BP 100s/50s. Pharmacy has been consulted for cefepime dosing for an additional 5 days of treatment.  Plan: Discontinue doxycycline and Zosyn Start cefepime 2g IV q8hrs  Monitor renal function and overall clinical picture F/u de-escalation of antibiotics given administration of Zosyn since 1/12  Height: 5\' 10"  (177.8 cm) Weight: 67 kg (147 lb 11.3 oz) IBW/kg (Calculated) : 73  Temp (24hrs), Avg:99.3 F (37.4 C), Min:98.2 F (36.8 C), Max:100.3 F (37.9 C)  Recent Labs  Lab 05/15/23 0558 05/16/23 0548 05/17/23 0849 05/18/23 0325 05/19/23 0242  WBC 11.2* 9.5 9.2 8.1  --   CREATININE 0.63 0.43* 0.36* 0.31* <0.30*    CrCl cannot be calculated (This lab value cannot be used to calculate CrCl because it is not a number: <0.30).    No Known Allergies  Antimicrobials this admission: Cefepime 1/20 >> (1/24) Zosyn 1/12 >> 1/20 Doxy 1/12 >> 1/20 Vanc 1/12 >> 1/14  Dose adjustments this admission: N/A  Microbiology results: 1/13 Sputum: PsA (pan sensitive) 1/12 Ucx: 80k PsA (same in Dec, likely colonized) 1/12 MRSA PCR: negative   Thank you for allowing pharmacy to be a part of this patient's care.  Cherylin Mylar, PharmD Clinical Pharmacist  1/20/20259:51 AM

## 2023-05-21 NOTE — Progress Notes (Signed)
Palliative Medicine Inpatient Follow Up Note HPI: 43 y.o. male  with past medical history of end stage MS, sacral decubitis with osteomyelitis, DM2, admitted on 05/13/2023 with altered mental status. Found to be septic due to UTI, and aspiration pneumonia. Blood cultures negative to date. Currently intubated. Palliative medicine consulted for goals of care.    Today's Discussion 05/21/2023  *Please note that this is a verbal dictation therefore any spelling or grammatical errors are due to the "Dragon Medical One" system interpretation.  Chart reviewed inclusive of vital signs, progress notes, laboratory results, and diagnostic images.   I met with [name] at bedside this afternoon he chronically ill appearing intubated, awake.  Have spoken to patients RN, [name] who shares that deaunte will likely be extubated in the oncoming day(s).   I have called and spoken to patients brother, [name]. Created space and opportunity for [name] to explore thoughts feelings and fears regarding his brothers current medical situation. He and I discussed that [name] is at end stage disease and I worry for [place] to keep intubating him is likely causing a degree of both trauma and discomfort. [name] shares that he has thought about the idea of tracheostomy and he would want to pursue this. He states that he has been trying to get in touch with patients daughter, [name] though this has been unsuccessful. He wanted to give her one week to get back to him, thus far he still has no clarity in regard to hearing from her.  ___________________ Addendum:  Have spoken to [name], APP of CCM who shares that he spoke to patients brother who has agreed to move forward with tracheostomy.   ___________________  I have spoke to [facility] CM - [name] who has done due diligence in trying to reach all family members. The only viable contact at this time is the patients brother, [name].    Questions and concerns  addressed/Palliative Support Provided.   Objective Assessment: Vital Signs Vitals:   05/21/23 1200 05/21/23 1208  BP: 123/69   Pulse: (!) 104 (!) 111  Resp: (!) 25 (!) 26  Temp: (!) 100.8 F (38.2 C) (!) 100.9 F (38.3 C)  SpO2: 100% 100%    Intake/Output Summary (Last 24 hours) at 05/21/2023 1444 Last data filed at 05/21/2023 1400 Gross per 24 hour  Intake 2452.57 ml  Output 1110 ml  Net 1342.57 ml   Last Weight  Most recent update: 05/21/2023  6:20 AM    Weight  67 kg (147 lb 11.3 oz)            Gen:  Middle aged AA M chronically ill appearing HEENT:  ETT, dry mucous membranes CV: Irregular rate and irregular rhythm  PULM: On mechanical ventilator ABD: (+) G-Tube, soft/nontender  EXT: Pedal edema Neuro: Open eyes  SUMMARY OF RECOMMENDATIONS   Full Code / Full Scope of Care  Patients brother, [name] would like to pursue tracheostomy  Have asked TOC to complete a comprehensive investigation for additional family members contact information - namely patients daughter   PMT will continue to follow along  Time Spent: 34 Billing based on MDM: High ______________________________________________________________________________________ [name] Palliative Medicine Team Team Cell Phone: [phone] Please utilize secure chat with additional questions, if there is no response within 30 minutes please call the above phone number  Palliative Medicine Team providers are available by phone from 7am to 7pm daily and can be reached through the team cell phone.  Should this patient require  assistance outside of these hours, please call the patient's attending physician.

## 2023-05-21 NOTE — TOC Progression Note (Addendum)
Transition of Care [facility]) - Progression Note    Patient Details  Name: KAESYN STIEG MRN: [id number] Date of Birth: [birth year]  Transition of Care [facility]) CM/SW Contact  Beckie Busing, RN Phone [phone]  05/21/2023, 2:55 PM  Clinical Narrative:    TOC receive referral to attempt to locate a valid number for patients daughter. CM has called Kroy Saffle brother [phone] who states that he has no contact whatsoever with patients daughter and he is not aware of anyone in the family who may have her contact information. CM attempted to call Eddison Iniguez (daughter) [phone] recording states that number can not be dialed. CM called Carren Rang patient sister in law [phone]. Hector Shade states that she does not have a number for Magda Paganini and that the whole family has been trying to reach her with no luck.   1515 CM called [phone] which is a number listed in records from [facility]. There is a male that answers phone but she explains that this is not [place] number and that people have called previously looking for [place].   1523 CM attempted [phone]  for daughter Magda Paganini per search, no answer recording stating welcome to [employer] , your call can not be completed as dialed.    Expected Discharge Plan: Long Term Nursing Home Barriers to Discharge: Continued Medical Work up  Expected Discharge Plan and Services                                               Social Determinants of Health (SDOH) Interventions SDOH Screenings   Food Insecurity: No Food Insecurity (04/09/2023)  Housing: Low Risk  (04/09/2023)  Transportation Needs: No Transportation Needs (04/09/2023)  Utilities: Not At Risk (04/09/2023)  Tobacco Use: High Risk (05/13/2023)    Readmission Risk Interventions     No data to display

## 2023-05-21 NOTE — Progress Notes (Signed)
NAME:  [name], MRN:  [id number], DOB:  Dec 09, 1980, LOS: 8 ADMISSION DATE:  05/13/2023, CONSULTATION DATE:  05/21/23 REFERRING MD:  [name], M.D. CHIEF COMPLAINT:  "Altered Mental Status"  History of Present Illness:  43 y.o. male with end-stage multiple sclerosis and sacral osteomyelitis.  Presented to the emergency department today with altered mental status and fever.  Patient currently resides at a skilled nursing facility and with these changes EMS transported the patient to the ED for evaluation.  Normally patient is able to speak 1 word at a time.  Previously patient was on hospice but family decided so back to a Full Code Status.  Review of the electronic medical record indicates patient was admitted from 12/9 through 04/13/2023 for osteomyelitis of the coccyx and sacrum as well as a stage IV sacral decubitus ulcer.  Patient was treated with antibiotics which were stopped at the time of discharge.  Palliative Care Medicine was planned to continue following at that time.  Following arrival patient was evaluated and admitted by hospitalist service.  Shortly after the admission ED provider was called to the patient's room with hypoxia and respiratory distress.  Patient under went bag mask ventilation and then was placed on BiPAP for respiratory support.  After contacting the patient's family/brother the patient was intubated this evening in the emergency department by the ED provider.  With concern for sepsis patient was given bolus IV fluid with lactated Ringer's and empiric antibiotic therapy with vancomycin, Flagyl, and cefepime. I personally spoke with the patient's bedside nurse and he remains febrile despite receiving Tylenol. Patient notably still has a sacral decubitus ulcer and the beginning of another ulceration on his gluteus noted by nursing staff.  Pertinent  Medical History  N/A  Significant Hospital Events: Including procedures, antibiotic start and stop dates in addition to  other pertinent events   01/12 - Presented to ED & family revoked hospice status >> intubated by EDP >> PCCM consulted for admission 1/15 PSV trial for a couple of hours, copious secretions 1/17 extubated 1/18 re-intubated due to increased HR, work of breathing and increased secretions 1/20 cxr improved. Good Ve w/ PSV 5. No accessory use   Interim History / Subjective:   Looks comfortable on PSV  No distress or accessory use   Objective   Blood pressure (!) 108/57, pulse 87, temperature 98.2 F (36.8 C), temperature source Oral, resp. rate 20, height 5\' 10"  (1.778 m), weight 67 kg, SpO2 100%.    Vent Mode: PRVC FiO2 (%):  [40 %] 40 % Set Rate:  [20 bmp] 20 bmp Vt Set:  [430 mL] 430 mL PEEP:  [5 cmH20] 5 cmH20 Plateau Pressure:  [10 cmH20-13 cmH20] 10 cmH20   Intake/Output Summary (Last 24 hours) at 05/21/2023 0742 Last data filed at 05/21/2023 0600 Gross per 24 hour  Intake 2748.62 ml  Output 1265 ml  Net 1483.62 ml   Filed Weights   05/19/23 0555 05/20/23 0600 05/21/23 0600  Weight: 71 kg 69.2 kg 67 kg    Examination: General this is a debilitated 43 year old male Pulm scattered rhonchi, no accessory use currently on PSV. VTs in 400s on PS of 5/peep 5.  Card rrr Abd soft Last CXR 18th. Ett good position. Right sided patchy airspace disease. Improved aeration c/w prior imaging  PCXR today w/ out infiltrates. Essentially clear  Ext bilateral foot drop. Dependent edema pulses palp  Neuro awake. Will grip had to request w/ LUE. No movement on right  side.  GU cl yellow  Resolved Hospital Problem list   Lactic Acidosis Acute Metabolic Encephalopathy Sepsis  Elevated Liver Enzymes Assessment & Plan:   Acute Hypoxemic respiratory Failure due to PNA and mucous plugging/ atelectasis in setting of neuromuscular disorder Now s/p reintubation on 18th, initial sputum + PSA (Pan sens) Plan Cont PSV w/ rest on vent At HS if not extubated RASS goal 0 to -1 VAP bundle  Daily  assessment for weaning/extubation  Day 9 GNR coverage. No need to cont doxy.  Will narrow to cefepime, complete 5 more days,  F/u CBC and PCT  Will reach out to family prior to extubation. Need to decide on extubation vs trach   Sacral Decubitus Ulcer, POA wound care consulted Plan See note from 1/13 cont care as outlined  DMII Plan Goal glucose 140-180 Cont sensitive scale Cont semeglee at 16 units bid  End Stage Multiple Sclerosis - generalized weakness and multiple skin ulcerations and wounds from pressure injuries present on admission Plan Will cont goals of care discussion. Looking like trach would be needed for airway clearance    Moderate Protein Calorie Malnutrition Plan Cont tubefeed  Best Practice (right click and "Reselect all SmartList Selections" daily)   Diet/type: tubefeeds DVT prophylaxis SCD Pressure ulcer(s): present on admission  GI prophylaxis: H2B Lines: yes and it is still needed Foley:  Yes, and it is still needed Code Status:  full code Last date of multidisciplinary goals of care discussion [1/18, talked with [name] - brother. Update provided, plan to meet with palliative care and patient's daughter along with his brother to formulate long term plan] I will call today   My time 48 min  [name] ACNP-BC [facility] Pulmonary/Critical Care Pager # [phone] OR # [phone] if no answer

## 2023-05-22 ENCOUNTER — Other Ambulatory Visit: Payer: Self-pay

## 2023-05-22 ENCOUNTER — Inpatient Hospital Stay (HOSPITAL_COMMUNITY): Payer: Medicare Other

## 2023-05-22 DIAGNOSIS — J189 Pneumonia, unspecified organism: Secondary | ICD-10-CM | POA: Diagnosis not present

## 2023-05-22 DIAGNOSIS — A419 Sepsis, unspecified organism: Secondary | ICD-10-CM | POA: Diagnosis not present

## 2023-05-22 LAB — GLUCOSE, CAPILLARY
Glucose-Capillary: 154 mg/dL — ABNORMAL HIGH (ref 70–99)
Glucose-Capillary: 130 mg/dL — ABNORMAL HIGH (ref 70–99)
Glucose-Capillary: 92 mg/dL (ref 70–99)
Glucose-Capillary: 89 mg/dL (ref 70–99)
Glucose-Capillary: 118 mg/dL — ABNORMAL HIGH (ref 70–99)

## 2023-05-22 LAB — CBC
HCT: 27.1 % — ABNORMAL LOW (ref 39.0–52.0)
Hemoglobin: 8 g/dL — ABNORMAL LOW (ref 13.0–17.0)
MCH: 26.3 pg (ref 26.0–34.0)
MCHC: 29.5 g/dL — ABNORMAL LOW (ref 30.0–36.0)
MCV: 89.1 fL (ref 80.0–100.0)
Platelets: 342 10*3/uL (ref 150–400)
RBC: 3.04 MIL/uL — ABNORMAL LOW (ref 4.22–5.81)
RDW: 17.5 % — ABNORMAL HIGH (ref 11.5–15.5)
WBC: 6.8 10*3/uL (ref 4.0–10.5)
nRBC: 0 % (ref 0.0–0.2)

## 2023-05-22 MED ORDER — PHENYLEPHRINE 80 MCG/ML (10ML) SYRINGE FOR IV PUSH (FOR BLOOD PRESSURE SUPPORT)
PREFILLED_SYRINGE | INTRAVENOUS | Status: AC
  Filled 2023-05-22: qty 10

## 2023-05-22 MED ORDER — SODIUM CHLORIDE 0.9 % IV SOLN: 250.0000 mL | INTRAVENOUS | Status: AC

## 2023-05-22 MED ORDER — NOREPINEPHRINE 4 MG/250ML-% IV SOLN: 2.0000 ug/min | INTRAVENOUS | Status: DC

## 2023-05-22 MED ORDER — NOREPINEPHRINE 4 MG/250ML-% IV SOLN: 0.0000 ug/min | INTRAVENOUS | Status: DC

## 2023-05-22 MED ORDER — ENOXAPARIN SODIUM 60 MG/0.6ML IJ SOSY
60.0000 mg | PREFILLED_SYRINGE | Freq: Two times a day (BID) | INTRAMUSCULAR | Status: DC
  Administered 2023-05-22 – 2023-05-24 (×4): 60 mg via SUBCUTANEOUS
  Filled 2023-05-22 (×4): qty 0.6

## 2023-05-22 MED ORDER — NOREPINEPHRINE 4 MG/250ML-% IV SOLN
INTRAVENOUS | Status: AC
  Filled 2023-05-22: qty 250

## 2023-05-22 NOTE — Progress Notes (Signed)
NAME:  [name], MRN:  [id number], DOB:  [birth year], LOS: 9 ADMISSION DATE:  05/13/2023, CONSULTATION DATE:  05/22/23 REFERRING MD:  Beckey Downing, M.D. CHIEF COMPLAINT:  "Altered Mental Status"  History of Present Illness:  43 y.o. male with end-stage multiple sclerosis and sacral osteomyelitis.  Presented to the emergency department today with altered mental status and fever.  Patient currently resides at a skilled nursing facility and with these changes EMS transported the patient to the ED for evaluation.  Normally patient is able to speak 1 word at a time.  Previously patient was on hospice but family decided so back to a Full Code Status.  Review of the electronic medical record indicates patient was admitted from 12/9 through 04/13/2023 for osteomyelitis of the coccyx and sacrum as well as a stage IV sacral decubitus ulcer.  Patient was treated with antibiotics which were stopped at the time of discharge.  Palliative Care Medicine was planned to continue following at that time.  Following arrival patient was evaluated and admitted by hospitalist service.  Shortly after the admission ED provider was called to the patient's room with hypoxia and respiratory distress.  Patient under went bag mask ventilation and then was placed on BiPAP for respiratory support.  After contacting the patient's family/brother the patient was intubated this evening in the emergency department by the ED provider.  With concern for sepsis patient was given bolus IV fluid with lactated Ringer's and empiric antibiotic therapy with vancomycin, Flagyl, and cefepime. I personally spoke with the patient's bedside nurse and he remains febrile despite receiving Tylenol. Patient notably still has a sacral decubitus ulcer and the beginning of another ulceration on his gluteus noted by nursing staff.  Pertinent  Medical History  N/A  Significant Hospital Events: Including procedures, antibiotic start and stop dates in addition to  other pertinent events   01/12 - Presented to ED & family revoked hospice status >> intubated by EDP >> PCCM consulted for admission 1/15 PSV trial for a couple of hours, copious secretions 1/17 extubated 1/18 re-intubated due to increased HR, work of breathing and increased secretions 1/20 cxr improved. Good Ve w/ PSV 5. No accessory use spoke w/ brother given concern about cough and airway protection decided to electively trach vs extubate and potentially fail req re-intubation and trach 1/21 trach   Interim History / Subjective:   No distress awaiting Trach   Objective   Blood pressure 104/63, pulse 81, temperature 98.4 F (36.9 C), resp. rate 20, height 5\' 10"  (1.778 m), weight 65.5 kg, SpO2 100%.    Vent Mode: PRVC FiO2 (%):  [30 %-40 %] 40 % Set Rate:  [20 bmp] 20 bmp Vt Set:  [430 mL] 430 mL PEEP:  [5 cmH20] 5 cmH20 Pressure Support:  [5 cmH20-8 cmH20] 8 cmH20 Plateau Pressure:  [14 cmH20] 14 cmH20   Intake/Output Summary (Last 24 hours) at 05/22/2023 0915 Last data filed at 05/22/2023 0855 Gross per 24 hour  Intake 1349.81 ml  Output 1005 ml  Net 344.81 ml   Filed Weights   05/20/23 0600 05/21/23 0600 05/22/23 0500  Weight: 69.2 kg 67 kg 65.5 kg    Examination: General resting in bed  HENT temporal wasting. Orally intubated Pulm some scattered rhonchi. No accessory use PCXR yesterday showed improved aeration Card rrr Abd soft has PEG Ext warm no sig edema Neuro can follow commands. No movement except LUE Gu foley. Clear   Resolved Hospital Problem list   Lactic Acidosis Acute  Metabolic Encephalopathy Sepsis  Elevated Liver Enzymes Assessment & Plan:   Acute Hypoxemic respiratory Failure due to PNA and mucous plugging/ atelectasis in setting of neuromuscular disorder Now s/p reintubation on 18th, initial sputum + PSA (Pan sens) Plan Trach today then resume weaning protocol  And daily assessment for ATC RASS goal 0  VAP bundle  Day 10/14  GNR coverage  now on cefepime  Sacral Decubitus Ulcer, POA wound care consulted Plan See note from 1/13 cont care as outlined  DMII Plan Goal glucose 140-180 Cont sensitive scale Cont semeglee at 16 units bid  End Stage Multiple Sclerosis - generalized weakness and multiple skin ulcerations and wounds from pressure injuries present on admission Plan Will need SNF post dc Still full code. Need to continue to discuss this w/ brother. I told him given current nutritional status would be surprised if survives a year even w/ trach    Moderate Protein Calorie Malnutrition Plan Cont tubefeed  Best Practice (right click and "Reselect all SmartList Selections" daily)   Diet/type: tubefeeds DVT prophylaxis SCD Pressure ulcer(s): present on admission  GI prophylaxis: H2B Lines: yes and it is still needed Foley:  Yes, and it is still needed Code Status:  full code Last date of multidisciplinary goals of care discussion [1/18, talked with [name] - brother. Update provided, plan to meet with palliative care and patient's daughter along with his brother to formulate long term plan]  My time 26 min  Simonne Martinet ACNP-BC [facility] Pulmonary/Critical Care Pager # [phone] OR # [phone] if no answer

## 2023-05-22 NOTE — Plan of Care (Signed)
  Problem: Education: Goal: Ability to describe self-care measures that may prevent or decrease complications (Diabetes Survival Skills Education) will improve Outcome: Progressing Goal: Individualized Educational Video(s) Outcome: Progressing   Problem: Metabolic: Goal: Ability to maintain appropriate glucose levels will improve Outcome: Progressing   Problem: Skin Integrity: Goal: Risk for impaired skin integrity will decrease Outcome: Progressing   Problem: Respiratory: Goal: Ability to maintain a clear airway and adequate ventilation will improve Outcome: Progressing

## 2023-05-22 NOTE — Plan of Care (Signed)
  Problem: Respiratory: Goal: Ability to maintain a clear airway and adequate ventilation will improve Outcome: Progressing   Problem: Role Relationship: Goal: Method of communication will improve Outcome: Progressing   Problem: Clinical Measurements: Goal: Diagnostic test results will improve Outcome: Progressing Goal: Respiratory complications will improve Outcome: Progressing   Problem: Coping: Goal: Level of anxiety will decrease Outcome: Progressing

## 2023-05-22 NOTE — Procedures (Signed)
Diagnostic Bronchoscopy  [name]  [id number]  [birth year]  Date:05/22/23  Time:2:05 PM   Provider Performing:Sahith Nurse C Shaiden Aldous   Procedure: Diagnostic Bronchoscopy (40981)  Indication(s) Assist with direct visualization of tracheostomy placement  Consent Risks of the procedure as well as the alternatives and risks of each were explained to the patient and/or caregiver.  Consent for the procedure was obtained.   Anesthesia See separate tracheostomy note   Time Out Verified patient identification, verified procedure, site/side was marked, verified correct patient position, special equipment/implants available, medications/allergies/relevant history reviewed, required imaging and test results available.   Sterile Technique Usual hand hygiene, masks, gowns, and gloves were used   Procedure Description Bronchoscope advanced through endotracheal tube and into airway.  After suctioning out tracheal secretions, bronchoscope used to provide direct visualization of tracheostomy placement. Trach balloon entirely within tracheal lumen.  Complications/Tolerance None; patient tolerated the procedure well.   EBL None  Specimen(s) None

## 2023-05-22 NOTE — Progress Notes (Signed)
Nutrition Follow-up  DOCUMENTATION CODES:   Non-severe (moderate) malnutrition in context of chronic illness  INTERVENTION:  - Continue current TF regimen: Vital 1.5 at 50 ml/h (1200 ml per day) Prosource TF20 60 ml BID Provides 1960 kcal, 121 gm protein, 917 ml free water daily   - FWF per CCM/MD.    - Monitor weight trends.   NUTRITION DIAGNOSIS:   Moderate Malnutrition related to chronic illness (end-stage multiple sclerosis) as evidenced by moderate fat depletion, severe muscle depletion. *ongoing  GOAL:   Patient will meet greater than or equal to 90% of their needs *met with TF  MONITOR:   Vent status, Labs, Weight trends, TF tolerance  REASON FOR ASSESSMENT:   Consult Enteral/tube feeding initiation and management (trickle tube feeds)  ASSESSMENT:   43 y.o. male with end-stage multiple sclerosis and sacral osteomyelitis  who presented with altered mental status and fever. Admitted for severe sepsis and acute hypoxemic respiratory failure.  1/12 Admit; family revoked hospice status; intubated  1/13 Vital 1.5 started at 45mL/hr 1/15 Reached goal TF of 1mL/hr 1/21 s/p trach  Patient is currently intubated on ventilator support MV: 8.5 L/min Temp (24hrs), Avg:99.5 F (37.5 C), Min:98.4 F (36.9 C), Max:100.8 F (38.2 C)  No family at bedside. TF off for trach placement, which patient had done this morning.  Plan to restart tube feeds at goal this afternoon.  GOC discussions ongoing.   Admit weight: 135# Current weight: 144# I&O's: + 9.9L + for mild pitting RUE/LUE edema and non-pitting RLE/LLE  Medications reviewed and include: Colace, Miralax\ Precedex Fentanyl  Labs reviewed:  HA1C 8.2 Blood Glucose 101-156 x24 hours   Diet Order:   Diet Order             Diet NPO time specified  Diet effective now                   EDUCATION NEEDS:  Not appropriate for education at this time  Skin:  Skin Assessment: Skin Integrity  Issues: Skin Integrity Issues:: Stage IV, Stage II, Stage I Stage I: Left Hip; Right Hip Stage II: Left Hip Stage IV: Sacrum  Last BM:  1/21 - type 7 - rectal tube  Height:  Ht Readings from Last 1 Encounters:  05/16/23 5\' 10"  (1.778 m)   Weight:  Wt Readings from Last 1 Encounters:  05/22/23 65.5 kg    BMI:  Body mass index is 20.72 kg/m.  Estimated Nutritional Needs:  Kcal:  1850-2150 kcals Protein:  90-125 grams Fluid:  >/= 1.8L    Shelle Iron RD, LDN Contact via Secure Chat.

## 2023-05-22 NOTE — Progress Notes (Addendum)
   Palliative Medicine Inpatient Follow Up Note HPI: 43 y.o. male  with past medical history of end stage MS, sacral decubitis with osteomyelitis, DM2, admitted on 05/13/2023 with altered mental status. Found to be septic due to UTI, and aspiration pneumonia. Blood cultures negative to date. Currently intubated. Palliative medicine consulted for goals of care.    Today's Discussion 05/22/2023  *Please note that this is a verbal dictation therefore any spelling or grammatical errors are due to the "Dragon Medical One" system interpretation.  Chart reviewed inclusive of vital signs, progress notes, laboratory results, and diagnostic images.   Per discussion with nursing staff patient receiving a tracheostomy.  Upon brief assessment, procedure being performed.   Plan for patient to be weaned off vent support in the oncoming days.  Have communicated with the MSW team - by default at this time patients surrogate decision maker is his brother, [name]. Likely return to [facility] once medically optimized. _______________________ Addendum:  I went to bedside this evening to see how [name] was doing post procedure. He is awake and able to blink his eyes for me with questioning and use his left hand grip. I shared with him my role in his care. I shared the importance of understanding his wishes moving forward.  I shared if at any point he desires de-escalating care that we should honor this. He did squeeze my hand though it is not clear if he understood the context of the conversation. I did try to reiterate the same point multiple times though hand squeeze was not consistent.   Questions and concerns addressed/Palliative Support Provided.   Objective Assessment: Vital Signs Vitals:   05/22/23 1053 05/22/23 1200  BP:  121/77  Pulse: 87 89  Resp: 16 20  Temp: 99.3 F (37.4 C) 99.5 F (37.5 C)  SpO2: 100% 100%    Intake/Output Summary (Last 24 hours) at 05/22/2023 1249 Last data  filed at 05/22/2023 1228 Gross per 24 hour  Intake 1268.69 ml  Output 805 ml  Net 463.69 ml   Last Weight  Most recent update: 05/22/2023  5:12 AM    Weight  65.5 kg (144 lb 6.4 oz)            Gen:  Middle aged AA M chronically ill appearing HEENT:  (+) tracheostomy, dry mucous membranes CV: Irregular rate and irregular rhythm  PULM: On trach collar, breathing is not labored ABD: (+) G-Tube, soft/nontender  EXT: Pedal edema, squeezes with L hand Neuro: Open eyes - can blink appropriatley  SUMMARY OF RECOMMENDATIONS   Full Code / Full Scope of Care  S/P tracheostomy this morning  Patients decision maker by default is his brother, [name]  PMT will continue to follow along peripherally given the goals   MDM: Moderate ______________________________________________________________________________________ [name] [place] Palliative Medicine Team Team Cell Phone: [phone] Please utilize secure chat with additional questions, if there is no response within 30 minutes please call the above phone number  Palliative Medicine Team providers are available by phone from 7am to 7pm daily and can be reached through the team cell phone.  Should this patient require assistance outside of these hours, please call the patient's attending physician.

## 2023-05-22 NOTE — Procedures (Signed)
Percutaneous Tracheostomy Procedure Note   KREIG DOCKEN  [id number]  06/16/1980  Date:05/22/23  Time:11:32 AM   Provider Performing:Pete E Tanja Port  Procedure: Percutaneous Tracheostomy with Bronchoscopic Guidance (41324)  Indication(s) Vent weaning and airway clearance   Consent Risks of the procedure as well as the alternatives and risks of each were explained to the patient and/or caregiver.  Consent for the procedure was obtained.  Anesthesia Etomidate, Versed, Fentanyl, Vecuronium   Time Out Verified patient identification, verified procedure, site/side was marked, verified correct patient position, special equipment/implants available, medications/allergies/relevant history reviewed, required imaging and test results available.   Sterile Technique Maximal sterile technique including sterile barrier drape, hand hygiene, sterile gown, sterile gloves, mask, hair covering.    Procedure Description Appropriate anatomy identified by palpation.  Patient's neck prepped and draped in sterile fashion.  1% lidocaine with epinephrine was used to anesthetize skin overlying neck.  1.5cm incision made and blunt dissection performed until tracheal rings could be easily palpated.   Then a size 8 Shiley tracheostomy was placed under bronchoscopic visualization using usual Seldinger technique and serial dilation.   Bronchoscope confirmed placement above the carina.  Tracheostomy was sutured in place with adhesive pad to protect skin under pressure.    Patient connected to ventilator.   Complications/Tolerance None; patient tolerated the procedure well. Chest X-ray is ordered to confirm no post-procedural complication.   EBL Minimal   Specimen(s) None

## 2023-05-23 DIAGNOSIS — J9621 Acute and chronic respiratory failure with hypoxia: Secondary | ICD-10-CM

## 2023-05-23 LAB — GLUCOSE, CAPILLARY
Glucose-Capillary: 103 mg/dL — ABNORMAL HIGH (ref 70–99)
Glucose-Capillary: 110 mg/dL — ABNORMAL HIGH (ref 70–99)
Glucose-Capillary: 123 mg/dL — ABNORMAL HIGH (ref 70–99)
Glucose-Capillary: 129 mg/dL — ABNORMAL HIGH (ref 70–99)
Glucose-Capillary: 134 mg/dL — ABNORMAL HIGH (ref 70–99)
Glucose-Capillary: 154 mg/dL — ABNORMAL HIGH (ref 70–99)

## 2023-05-23 LAB — BASIC METABOLIC PANEL
Anion gap: 7 (ref 5–15)
BUN: 20 mg/dL (ref 6–20)
CO2: 25 mmol/L (ref 22–32)
Calcium: 8.9 mg/dL (ref 8.9–10.3)
Chloride: 107 mmol/L (ref 98–111)
Creatinine, Ser: <0.3 mg/dL — ABNORMAL LOW (ref 0.61–1.24)
Glucose, Bld: 165 mg/dL — ABNORMAL HIGH (ref 70–99)
Potassium: 4.8 mmol/L (ref 3.5–5.1)
Sodium: 139 mmol/L (ref 135–145)

## 2023-05-23 MED ORDER — SODIUM CHLORIDE 0.9 % IV SOLN
2.0000 g | Freq: Three times a day (TID) | INTRAVENOUS | Status: DC
  Administered 2023-05-23 – 2023-05-24 (×4): 2 g via INTRAVENOUS
  Filled 2023-05-23 (×4): qty 12.5

## 2023-05-23 MED ORDER — PROPRANOLOL HCL 20 MG PO TABS
20.0000 mg | ORAL_TABLET | Freq: Three times a day (TID) | ORAL | Status: DC
  Administered 2023-05-23 – 2023-05-24 (×4): 20 mg
  Filled 2023-05-23 (×4): qty 1

## 2023-05-23 NOTE — Progress Notes (Signed)
Placed PT on 40% ATC at approximately 0825- RN aware. PT tolerating well at this time.

## 2023-05-23 NOTE — Evaluation (Addendum)
SLP Cancellation Note  Patient Details Name: [name] MRN: [id number] DOB: [birth year]   Cancelled treatment:       Reason Eval/Treat Not Completed: Other (comment) (Per RT, pt had "tons of oral secretions". He coughed up so many secretions it soaked his gown and they are blood tinged."  His HR went up to 137. She advised he was not ready for PMSV trials, will hold on cog eval (that palliative ordered) until pt able to speak.)   Of note, pt was seen by SLP In October 2024 and required oral suctioning due to oral holding, no hyo-laryngeal elevation noted with po trials - though pt reported he had swallowed and he did not follow directions to cough or swallow on command. His spontaneous reflexive cough was weak and not productive.  He was advised to continue NPO with oral care during that admit.     Rolena Infante, MS [facility] SLP Acute Rehab Services Office [phone]   Chales Abrahams 05/23/2023, 12:40 PM

## 2023-05-23 NOTE — Progress Notes (Signed)
Daily Progress Note   Patient Name: [name]       Date: 05/23/2023 DOB: November 01, 1980  Age: 43 y.o. MRN#: [id number] Attending Physician: Tomma Lightning, MD Primary Care Physician: Lonie Peak, PA-C Admit Date: 05/13/2023  Reason for Consultation/Follow-up: Establishing goals of care Brother [name] is involved in decision making. Open to having meeting at bedside with patient to determine long term care goals. Was on hospice previously then revoked.   Patient Profile/HPI:   43 y.o. male  with past medical history of end stage MS, sacral decubitis with osteomyelitis, DM2, admitted on 05/13/2023 with altered mental status. Found to be septic due to UTI, and aspiration pneumonia. Blood cultures negative to date. Currently intubated. Palliative medicine consulted for goals of care.   Subjective: Chart reviewed including labs, progress notes, imaging from this and previous encounters.  [name] has had tracheostomy placed. He shakes head no to pain. When I mention his tracheostomy he looks confused and shrugs. Social work has completed search for Management consultant- patient's brother has also not been able to get in touch with patient's daughter.  Discussed with SLP- they are evaluating for Passy-Muir valve. Requested possible cognitive linguistic eval to facilitate communication and cognitive understanding.   Review of Systems  Unable to perform ROS: Acuity of condition     Physical Exam Vitals and nursing note reviewed.  Constitutional:      Appearance: He is ill-appearing.  Cardiovascular:     Rate and Rhythm: Tachycardia present.  Pulmonary:     Comments: Increased effort Neurological:     Comments: nonverbal             Vital Signs: BP (!) 142/87   Pulse (!) 107   Temp 98.4 F  (36.9 C)   Resp 18   Ht 5\' 10"  (1.778 m)   Wt 66.3 kg   SpO2 100%   BMI 20.97 kg/m  SpO2: SpO2: 100 % O2 Device: O2 Device: Tracheostomy Collar O2 Flow Rate: O2 Flow Rate (L/min): (S) 10 L/min  Intake/output summary:  Intake/Output Summary (Last 24 hours) at 05/23/2023 1235 Last data filed at 05/23/2023 1127 Gross per 24 hour  Intake 2160.58 ml  Output 1355 ml  Net 805.58 ml   LBM: Last BM Date : 05/22/23 Baseline Weight: Weight: 89.5 kg Most recent weight: Weight:  66.3 kg       Palliative Assessment/Data: 10% (with PEG tube)      Patient Active Problem List   Diagnosis Date Noted   Severe sepsis (HCC) 05/13/2023   Hypomagnesemia 04/11/2023   Normocytic anemia 04/10/2023   Sacral decubitus ulcer, stage IV (HCC) 04/10/2023   Sepsis due to osteomyelitis of the coccyx and sacrum (HCC) 04/09/2023   Malnutrition of moderate degree 02/02/2023   History of pulmonary embolus (PE) 02/01/2023   Hypernatremia 02/01/2023   Hypokalemia 02/01/2023   Dysphagia 02/01/2023   Muscle spasms of neck    Neuropathic pain    Vitamin B12 deficiency    Hypoalbuminemia    Urinary frequency    Tobacco abuse    Steroid-induced hyperglycemia    Marijuana abuse, continuous 04/12/2017   Vitamin D deficiency 04/12/2017   Multiple sclerosis pseudoexacerbation (HCC)    Abnormality of gait 05/10/2016   Hypertension    Depression    Multiple sclerosis (HCC) 03/02/2011    Palliative Care Assessment & Plan    Assessment/Recommendations/Plan  Multiple infections- sacral decubitis with osteomyelitis, aspiration pneumonia, UTI- continue current plan of care Goals of care- full code and full scope Appreciate SLP assistance with cognitive/linguistic evaluation and assistance with possible communication devices  Code Status: Full code  Prognosis:  Unable to determine  Discharge Planning: To Be Determined  Care plan was discussed with patient's family and care team.   Thank you for  allowing the Palliative Medicine Team to assist in the care of this patient.  Total time: 35 minutes Prolonged billing:  Time includes:   Preparing to see the patient (e.g., review of tests) Obtaining and/or reviewing separately obtained history Performing a medically necessary appropriate examination and/or evaluation Counseling and educating the patient/family/caregiver Ordering medications, tests, or procedures Referring and communicating with other health care professionals (when not reported separately) Documenting clinical information in the electronic or other health record Independently interpreting results (not reported separately) and communicating results to the patient/family/caregiver Care coordination (not reported separately) Clinical documentation  Ocie Bob, AGNP-C Palliative Medicine   Please contact Palliative Medicine Team phone at [phone] for questions and concerns.

## 2023-05-23 NOTE — Plan of Care (Signed)
  Problem: Metabolic: Goal: Ability to maintain appropriate glucose levels will improve Outcome: Progressing   Problem: Clinical Measurements: Goal: Diagnostic test results will improve Outcome: Progressing Goal: Respiratory complications will improve Outcome: Progressing   Problem: Coping: Goal: Level of anxiety will decrease Outcome: Progressing

## 2023-05-23 NOTE — Progress Notes (Signed)
Brief PCCM Progress Note   Notified by RN that patient has become more tachycardic and hypertensive this afternoon. On assessment patient was being placed back on vent for signs of fatigue. ATC trial lasted ~9hrs.   Tachycardia persisted despite vent support, on chart review patient is on propanolol at baseline. Now that patient is off continuous sedation hemodynamics will allow resumption of home beta blocker. Order placed.   Clair Alfieri D. Harris, NP-C [facility] Personal contact information can be found on Amion  If no contact or response made please call [phone] 05/23/2023, 5:21 PM

## 2023-05-23 NOTE — Progress Notes (Signed)
PT Note  Patient Details Name: [name] MRN: [id number] DOB: 1981/04/26   Cancelled Treatment:    Reason Eval/Treat Not Completed: PT screened, no needs identified, will sign off. Pt is currently at baseline with end stage MS, pt resides in SNF and is total care. No PT warranted. Thank you for this referral.   Johnny Bridge, PT Acute Rehab   Jacqualyn Posey 05/23/2023, 3:54 PM

## 2023-05-23 NOTE — Progress Notes (Signed)
NAME:  [name], MRN:  [id number], DOB:  [birth year], LOS: 10 ADMISSION DATE:  05/13/2023, CONSULTATION DATE:  05/23/23 REFERRING MD:  Beckey Downing, M.D. CHIEF COMPLAINT:  "Altered Mental Status"  History of Present Illness:  43 y.o. male with end-stage multiple sclerosis and sacral osteomyelitis.  Presented to the emergency department today with altered mental status and fever.  Patient currently resides at a skilled nursing facility and with these changes EMS transported the patient to the ED for evaluation.  Normally patient is able to speak 1 word at a time.  Previously patient was on hospice but family decided so back to a Full Code Status.  Review of the electronic medical record indicates patient was admitted from 12/9 through 04/13/2023 for osteomyelitis of the coccyx and sacrum as well as a stage IV sacral decubitus ulcer.  Patient was treated with antibiotics which were stopped at the time of discharge.  Palliative Care Medicine was planned to continue following at that time.  Following arrival patient was evaluated and admitted by hospitalist service.  Shortly after the admission ED provider was called to the patient's room with hypoxia and respiratory distress.  Patient under went bag mask ventilation and then was placed on BiPAP for respiratory support.  After contacting the patient's family/brother the patient was intubated this evening in the emergency department by the ED provider.  With concern for sepsis patient was given bolus IV fluid with lactated Ringer's and empiric antibiotic therapy with vancomycin, Flagyl, and cefepime. I personally spoke with the patient's bedside nurse and he remains febrile despite receiving Tylenol. Patient notably still has a sacral decubitus ulcer and the beginning of another ulceration on his gluteus noted by nursing staff.  Pertinent  Medical History  N/A  Significant Hospital Events: Including procedures, antibiotic start and stop dates in addition to  other pertinent events   1/12 - Presented to ED & family revoked hospice status >> intubated by EDP >> PCCM consulted for admission 1/15 PSV trial for a couple of hours, copious secretions 1/17 extubated 1/18 re-intubated due to increased HR, work of breathing and increased secretions 1/20 cxr improved. Good Ve w/ PSV 5. No accessory use spoke w/ brother given concern about cough and airway protection decided to electively trach vs extubate and potentially fail req re-intubation and trach 1/21 trach  1/22 no issues overnight, placed on ATC trial at 0825  Interim History / Subjective:  Seen lying in bed mechanical ventilation alert and tracking movements.   Objective   Blood pressure 112/73, pulse 83, temperature 99.1 F (37.3 C), resp. rate 19, height 5\' 10"  (1.778 m), weight 66.3 kg, SpO2 100%.    Vent Mode: PRVC FiO2 (%):  [40 %] 40 % Set Rate:  [20 bmp] 20 bmp Vt Set:  [430 mL] 430 mL PEEP:  [5 cmH20] 5 cmH20 Pressure Support:  [5 cmH20] 5 cmH20 Plateau Pressure:  [10 cmH20-14 cmH20] 13 cmH20   Intake/Output Summary (Last 24 hours) at 05/23/2023 0721 Last data filed at 05/23/2023 0710 Gross per 24 hour  Intake 2484.06 ml  Output 1055 ml  Net 1429.06 ml   Filed Weights   05/21/23 0600 05/22/23 0500 05/23/23 0500  Weight: 67 kg 65.5 kg 66.3 kg    Examination: General: Deconditioned adult male lying in bed ventilation via tracheostomy in no acute distress HEENT: 8 cuffed trach midline, MM pink/moist, PERRL,  Neuro: Alert and interactive on ventilator, able to track movements and able to follow simple commands CV: s1s2  regular rate and rhythm, no murmur, rubs, or gallops,  PULM: Clear to auscultation bilaterally, no increased work of breathing, breath sounds GI: soft, bowel sounds active in all 4 quadrants, non-tender, non-distended, tolerating TF Extremities: warm/dry, no edema  Skin: no rashes or lesions  Resolved Hospital Problem list   Lactic Acidosis Acute Metabolic  Encephalopathy Sepsis  Elevated Liver Enzymes  Assessment & Plan:   Acute Hypoxemic respiratory Failure due to PNA and mucous plugging/atelectasis in setting of neuromuscular disorder -Initial sputum + PSA (Pan sens), trached 1/21 -Placed on ATC am 1/22 at 0825 P: Continue ventilator support with lung protective strategies  Daily assessment for ATC will likely require nocturnal vent support  Continue Cefepime to completed 14 days total of coverage  Wean PEEP and FiO2 for sats greater than 90%. Head of bed elevated 30 degrees. Plateau pressures less than 30 cm H20.  Follow intermittent chest x-ray and ABG.   SAT/SBT as tolerated, mentation preclude extubation  Ensure adequate pulmonary hygiene  Follow cultures  VAP bundle in place  PAD protocol  Stage 4 sacral Decubitus Ulcer with history of osteomyelitis Stage II pressure ulcer to left hip Stage I pressure ulcer left heel Stage I pressure ulcer right heel All pressure ulcers were present on admission  -Wound care consulted, recommendations for care provided in note written 1/13 P: Care per WOC reccs  Pressure alleviating devices  Optimize nutrition   Type 2 diabetes  P: Continue SSI and long acting insulin CBG goal 140-180 CBG ACHS   End Stage Multiple Sclerosis - generalized weakness and multiple skin ulcerations and wounds from pressure injuries present on admission P: Ongoing GOC discussion  Will need SNF at discharge   Moderate Protein Calorie Malnutrition P: Optimize nutrition as able  Best Practice (right click and "Reselect all SmartList Selections" daily)   Diet/type: tubefeeds DVT prophylaxis SCD Pressure ulcer(s): present on admission  GI prophylaxis: H2B Lines: yes and it is still needed Foley:  Yes, and it is still needed Code Status:  full code Last date of multidisciplinary goals of care discussion 1/18, talked with [name] - brother. Update provided, plan to meet with palliative care and  patient's daughter along with his brother to formulate long term plan  CRITICAL CARE Performed by: Cameryn Schum D. Harris   Total critical care time: 38 minutes  Critical care time was exclusive of separately billable procedures and treating other patients.  Critical care was necessary to treat or prevent imminent or life-threatening deterioration.  Critical care was time spent personally by me on the following activities: development of treatment plan with patient and/or surrogate as well as nursing, discussions with consultants, evaluation of patient's response to treatment, examination of patient, obtaining history from patient or surrogate, ordering and performing treatments and interventions, ordering and review of laboratory studies, ordering and review of radiographic studies, pulse oximetry and re-evaluation of patient's condition.  Ashea Winiarski D. Harris, NP-C [facility] Personal contact information can be found on Amion  If no contact or response made please call [phone] 05/23/2023, 7:31 AM

## 2023-05-24 DIAGNOSIS — J9601 Acute respiratory failure with hypoxia: Secondary | ICD-10-CM | POA: Diagnosis not present

## 2023-05-24 DIAGNOSIS — E119 Type 2 diabetes mellitus without complications: Secondary | ICD-10-CM

## 2023-05-24 DIAGNOSIS — L8990 Pressure ulcer of unspecified site, unspecified stage: Secondary | ICD-10-CM | POA: Insufficient documentation

## 2023-05-24 DIAGNOSIS — J189 Pneumonia, unspecified organism: Secondary | ICD-10-CM | POA: Diagnosis not present

## 2023-05-24 LAB — GLUCOSE, CAPILLARY
Glucose-Capillary: 141 mg/dL — ABNORMAL HIGH (ref 70–99)
Glucose-Capillary: 147 mg/dL — ABNORMAL HIGH (ref 70–99)
Glucose-Capillary: 150 mg/dL — ABNORMAL HIGH (ref 70–99)
Glucose-Capillary: 154 mg/dL — ABNORMAL HIGH (ref 70–99)
Glucose-Capillary: 164 mg/dL — ABNORMAL HIGH (ref 70–99)

## 2023-05-24 LAB — CBC
HCT: 27.7 % — ABNORMAL LOW (ref 39.0–52.0)
Hemoglobin: 8.2 g/dL — ABNORMAL LOW (ref 13.0–17.0)
MCH: 26.5 pg (ref 26.0–34.0)
MCHC: 29.6 g/dL — ABNORMAL LOW (ref 30.0–36.0)
MCV: 89.6 fL (ref 80.0–100.0)
Platelets: 406 10*3/uL — ABNORMAL HIGH (ref 150–400)
RBC: 3.09 MIL/uL — ABNORMAL LOW (ref 4.22–5.81)
RDW: 18.2 % — ABNORMAL HIGH (ref 11.5–15.5)
WBC: 8.2 10*3/uL (ref 4.0–10.5)
nRBC: 0 % (ref 0.0–0.2)

## 2023-05-24 MED ORDER — INSULIN ASPART 100 UNIT/ML IJ SOLN: 0.0000 [IU] | INTRAMUSCULAR | 11 refills | Status: DC

## 2023-05-24 MED ORDER — INSULIN GLARGINE-YFGN 100 UNIT/ML ~~LOC~~ SOLN: 16.0000 [IU] | Freq: Two times a day (BID) | SUBCUTANEOUS | 11 refills | Status: DC

## 2023-05-24 MED ORDER — PROPRANOLOL HCL 20 MG PO TABS: 20.0000 mg | ORAL_TABLET | Freq: Three times a day (TID) | ORAL | 0 refills | Status: AC

## 2023-05-24 MED ORDER — POLYETHYLENE GLYCOL 3350 17 G PO PACK: 17.0000 g | PACK | Freq: Every day | ORAL | 0 refills | Status: DC

## 2023-05-24 MED ORDER — FAMOTIDINE 20 MG PO TABS: 20.0000 mg | ORAL_TABLET | Freq: Two times a day (BID) | ORAL | 0 refills | Status: DC

## 2023-05-24 MED ORDER — SODIUM CHLORIDE 0.9 % IV SOLN: 2.0000 g | Freq: Three times a day (TID) | INTRAVENOUS | 0 refills | Status: DC

## 2023-05-24 MED ORDER — ORAL CARE MOUTH RINSE: 15.0000 mL | OROMUCOSAL | 0 refills | Status: DC

## 2023-05-24 MED ORDER — DOCUSATE SODIUM 50 MG/5ML PO LIQD: 100.0000 mg | Freq: Two times a day (BID) | ORAL | 0 refills | Status: DC

## 2023-05-24 MED ORDER — VITAL 1.5 CAL PO LIQD: 1000.0000 mL | ORAL | 0 refills | Status: DC

## 2023-05-24 MED ORDER — OXYCODONE HCL 5 MG PO TABS: 5.0000 mg | ORAL_TABLET | Freq: Four times a day (QID) | ORAL | 0 refills | Status: AC | PRN

## 2023-05-24 MED ORDER — CLONAZEPAM 1 MG PO TBDP: 1.0000 mg | ORAL_TABLET | Freq: Two times a day (BID) | ORAL | 0 refills | Status: DC

## 2023-05-24 MED ORDER — PROSOURCE TF20 ENFIT COMPATIBL EN LIQD: 60.0000 mL | Freq: Two times a day (BID) | ENTERAL | 1 refills | Status: DC

## 2023-05-24 MED ORDER — SODIUM CHLORIDE 0.9 % IV SOLN: 10.0000 mL | INTRAVENOUS | 0 refills | Status: DC | PRN

## 2023-05-24 NOTE — TOC Progression Note (Addendum)
Transition of Care [facility]) - Progression Note    Patient Details  Name: [name] MRN: [id number] Date of Birth: [birth year]  [facility] CM/SW Contact  [name], Henry Messier, RN Phone Number: 05/24/2023, 11:05 AM  Clinical Narrative:Noted Trach- for ST PMSV trial once appriopriate. PEG, sacral decub-Palliative care following w/ongoing discussions w/brother [name] is aware we are unable to reach dtr. From [facility]-LTC vs LTACH-currently d/c plan return back to [facility] LTC-will continue to follow.   -11:16a   Received LTACH referral-screen-[facility] has bed available can accept for LTACH.If agree I can discuss w/brother [name].Please advise. -11:27a Just spoke to [name](brother) he is in agreement. If all agree will need d/c summary. nsg to manage carelink acute to acute transfer. Await rm#, attending to follow @[facility] , report tel# -2p-Per [facility] rep [name]-he is awaiting to confirm patient's insurance medicare A concern of termination end of this month-await for nsg to receive rm#,report#,attending to follow @ [facility] LTACH-acute to acute transfer Nsg to manage-carelink forms-full transfer report;med necessity form for carelink Provided [facility] rep nsg tel# to transfer. No further CM needs. -3:26p Provided [facility] LTACH rep [name] nurse tel# for transfer if confirmed.    Expected Discharge Plan: Long Term Nursing Home Barriers to Discharge: Continued Medical Work up  Expected Discharge Plan and Services                                               Social Determinants of Health (SDOH) Interventions SDOH Screenings   Food Insecurity: No Food Insecurity (04/09/2023)  Housing: Low Risk  (04/09/2023)  Transportation Needs: No Transportation Needs (04/09/2023)  Utilities: Not At Risk (04/09/2023)  Tobacco Use: High Risk (05/13/2023)    Readmission Risk Interventions     No data to display

## 2023-05-24 NOTE — Discharge Summary (Signed)
Physician Discharge Summary         Patient ID: [name] MRN: [id number] DOB/AGE: November 16, 1980 43 y.o.  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 male with end-stage multiple sclerosis and sacral osteomyelitis.  Presented to the emergency department 05/13/2023 from local SNF with altered mental status and fever.Normally patient is able to speak 1 word at a time.  Previously patient was on hospice but family decided so back to a Full Code Status.   Review of the electronic medical record indicates patient was admitted from 12/9 through 04/13/2023 for osteomyelitis of the coccyx and sacrum as well as a stage IV sacral decubitus ulcer.  Patient was treated with antibiotics which were stopped at the time of discharge.  Palliative Care Medicine was planned to continue following at that time.    Following arrival patient was evaluated and admitted by hospitalist service.  Shortly after the admission ED provider was called to the patient's room with hypoxia and respiratory distress.  Patient under went bag mask ventilation and then was placed on BiPAP for respiratory support.  After contacting the patient's family/brother the patient was intubated this evening in the emergency department by the ED provider.  With concern for sepsis patient was given bolus IV fluid with lactated Ringer's and empiric antibiotic therapy with vancomycin, Flagyl, and cefepime. Patient notably still has a sacral decubitus ulcer and the beginning of another  ulceration on his gluteus noted by nursing staff.   See below for timeline of hospital events; 1/12 Presented to ED with AMS and fever, decompensated in ED resulting in intubation as family revoked hospice status. Empiricly started on  vancomycin, Flagyl, and cefepime 1/13 sputum and urine culture positive for pansensetive pseudomonas. Antibiotics changed to Zosyn and Doxy  1/15 PSV trial for a couple of hours, copious secretions 1/17 extubated 1/18 re-intubated due to increased HR, work of breathing and increased secretions 1/20 cxr improved. Good Ve w/ PSV 5. No accessory use spoke w/ brother given concern about cough and airway protection decided to electively trach vs extubate and potentially fail req re-intubation and trach. Antibiotics changed to Cefepime only  1/21 trach  1/22 no issues overnight, Tolerated ATC ~9hrs 1/23 Placed on ATC at 0800, no issues overnight. Stable for transfer to [facility]  Discharge Plan by Active Problems   Acute Hypoxemic respiratory Failure due to PNA and mucous plugging/atelectasis in setting of neuromuscular disorder -Initial sputum + PSA (Pan sens), trached 1/21 -Placed on ATC am 1/22 at 0825 P: Continue daily trials of ATC, will likely require nocturnal vent support  Continue Cefepime until 1/26 Continue ventilator support with lung protective strategies  Wean PEEP and FiO2 for sats greater than 90%. Head of bed elevated 30 degrees. Follow intermittent chest x-ray and ABG.   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  Prevalon boots ordered to reduce pressure to bilat  heels. Topical treatment orders provided for bedside nurses to perform as follows: 1. Apply moist kerlex gauze to sacrum wound Q day, using swab to fill, then cover with foam dressing.  Change foam dressing Q 3 days or PRN soiling. 2. Foam dressing to left hip, bilat knees, bilat heels, change Q 3 days or PRN soiling Pressure alleviating devices  Optimize nutrition    Type 2 diabetes  P: Continue SSI and long acting insulin CBG goal 140-180 CBG checks ACHS    End Stage Multiple Sclerosis - generalized weakness and multiple skin ulcerations and wounds from pressure injuries present on admission P: Ongoing GOC discussion  Recommendations made to hole Fingolimod at discharge given active infection, should discuss case with primary Neurologist Alphonzo Lemmings, MD with [facility] prior to resumption    Moderate Protein Calorie Malnutrition P: Optimize nutrition as able    Significant Hospital tests/ studies  1/12 Presented to ED with AMS and fever, decompensated in ED resulting in intubation as family revoked hospice status. Empiricly started on  vancomycin, Flagyl, and cefepime 1/13 sputum and urine culture positive for pansensetive pseudomonas. Antibiotics changed to Zosyn and Doxy  1/15 PSV trial for a couple of hours, copious secretions 1/17 extubated 1/18 re-intubated due to increased HR, work of breathing and increased secretions 1/20 cxr improved. Good Ve w/ PSV 5. No accessory use spoke w/ brother given concern about cough and airway protection decided to electively trach vs extubate and potentially fail req re-intubation and trach. Antibiotics changed to Cefepime only  1/21 trach  1/22 no issues overnight, Tolerated ATC ~9hrs 1/23 Placed on ATC at 0800, no issues overnight. Stable for transfer to LTAC  Procedures   1/21 Trach  Culture data/antimicrobials   Urine and sputum cultures positive for pansesnsitive pseudomonas    Consults  Palliative care     Discharge  Exam: BP (!) 140/73   Pulse (!) 112   Temp 100 F (37.8 C) (Axillary) Comment: notified RN  Resp (!) 27   Ht 5\' 10"  (1.778 m)   Wt 72.4 kg   SpO2 100%   BMI 22.90 kg/m   General: Chronically ill appearing adult male lying in bed on mechanical ventilation via trach, in NAD HEENT: ETT, MM pink/moist, PERRL,  Neuro: Opens eyes to verbal stimuli, able to follow simple commands CV: s1s2 regular rate and rhythm, no murmur, rubs, or gallops,  PULM:  Clear to auscultation, no increased work of breathing, no added breath sounds  GI: soft, bowel sounds active in all 4 quadrants, non-tender, non-distended, tolerating TF Extremities: warm/dry, no edema  Skin: no rashes or lesions, multiple sacral ulcer   Labs at discharge   Lab Results  Component Value Date   CREATININE <0.30 (L) 05/23/2023   BUN 20 05/23/2023   NA 139 05/23/2023   K 4.8 05/23/2023   CL 107 05/23/2023   CO2 25 05/23/2023   Lab Results  Component Value Date   WBC 8.2 05/24/2023   HGB 8.2 (L) 05/24/2023   HCT 27.7 (L) 05/24/2023   MCV 89.6 05/24/2023   PLT 406 (H) 05/24/2023   Lab Results  Component Value Date   ALT 31 05/17/2023   AST 20 05/17/2023   ALKPHOS 67 05/17/2023   BILITOT 0.2 05/17/2023   Lab Results  Component Value Date  INR 1.1 05/13/2023   INR 1.4 (H) 01/31/2023   INR 0.93 02/26/2011    Current radiological studies    [place] EKG SITE RITE Result Date: 05/22/2023 If Site Rite image not attached, placement could not be confirmed due to current cardiac rhythm.   Disposition:        Allergies as of 05/24/2023   No Known Allergies      Medication List     PAUSE taking these medications    Gilenya 0.5 MG Caps Wait to take this until your doctor or other care provider tells you to start again. Generic drug: Fingolimod HCl Place 0.5 mg into feeding tube daily.       STOP taking these medications    atropine 1 % ophthalmic solution   Basaglar KwikPen 100 UNIT/ML    cefTRIAXone 1 g injection Commonly known as: ROCEPHIN   ertapenem 1 g injection Commonly known as: INVANZ   EUCERIN INTENSIVE REPAIR EX   fluconazole 200 MG tablet Commonly known as: DIFLUCAN   furosemide 20 MG tablet Commonly known as: LASIX   gabapentin 250 MG/5ML solution Commonly known as: NEURONTIN   heparin flush 10 UNIT/ML Soln injection   methocarbamol 500 MG tablet Commonly known as: ROBAXIN   oxyCODONE-acetaminophen 5-325 MG tablet Commonly known as: PERCOCET/ROXICET   polyethylene glycol powder 17 GM/SCOOP powder Commonly known as: GLYCOLAX/MIRALAX Replaced by: polyethylene glycol 17 g packet   potassium chloride 20 MEQ/15ML (10%) Soln   Pro-Stat Liqd       TAKE these medications    acetaminophen 325 MG tablet Commonly known as: TYLENOL Place 650 mg into feeding tube 3 (three) times daily as needed for fever or mild pain (pain score 1-3). PRN order: take 650 mg per tube every 8 hours as needed for pain/fever   ceFEPIme 2 g in sodium chloride 0.9 % 100 mL Inject 2 g into the vein every 8 (eight) hours.   clonazePAM 1 MG disintegrating tablet Commonly known as: KLONOPIN Place 1 tablet (1 mg total) into feeding tube 2 (two) times daily.   docusate 50 MG/5ML liquid Commonly known as: COLACE Place 10 mLs (100 mg total) into feeding tube 2 (two) times daily.   enoxaparin 60 MG/0.6ML injection Commonly known as: LOVENOX Inject 60 mg into the skin every 12 (twelve) hours.   famotidine 20 MG tablet Commonly known as: PEPCID Place 1 tablet (20 mg total) into feeding tube 2 (two) times daily.   feeding supplement (PROSource TF20) liquid Place 60 mLs into feeding tube 2 (two) times daily.   feeding supplement (VITAL 1.5 CAL) Liqd Place 1,000 mLs into feeding tube continuous. What changed:  how much to take additional instructions Another medication with the same name was removed. Continue taking this medication, and follow the directions you see  here.   free water Soln Place 200 mLs into feeding tube every 4 (four) hours. What changed: how much to take   insulin aspart 100 UNIT/ML injection Commonly known as: novoLOG Inject 0-9 Units into the skin every 4 (four) hours. What changed:  how much to take when to take this additional instructions   insulin glargine-yfgn 100 UNIT/ML injection Commonly known as: SEMGLEE Inject 0.16 mLs (16 Units total) into the skin 2 (two) times daily.   lidocaine 4 % Place 1 patch onto the skin See admin instructions. Apply 1 patch to the lower back and lower extremities once a day- remove as directed   mouth rinse Liqd solution 15 mLs by Mouth  Rinse route every 2 (two) hours.   oxyCODONE 5 MG immediate release tablet Commonly known as: Oxy IR/ROXICODONE Place 1 tablet (5 mg total) into feeding tube every 6 (six) hours as needed for moderate pain (pain score 4-6).   polyethylene glycol 17 g packet Commonly known as: MIRALAX / GLYCOLAX Place 17 g into feeding tube daily. Start taking on: May 25, 2023 Replaces: polyethylene glycol powder 17 GM/SCOOP powder   propranolol 20 MG tablet Commonly known as: INDERAL Place 1 tablet (20 mg total) into feeding tube 3 (three) times daily. What changed: when to take this   sertraline 20 MG/ML concentrated solution Commonly known as: ZOLOFT Place 5 mLs (100 mg total) into feeding tube daily.   sodium chloride 0.9 % infusion Inject 10 mLs into the vein as needed (for administration of IV medications (carrier fluid)).   sodium hypochlorite external solution Commonly known as: DAKIN'S 1/2 STRENGTH Apply 1 Application topically daily. Sacral ulcer   thiamine 100 MG tablet Commonly known as: Vitamin B-1 Place 1 tablet (100 mg total) into feeding tube daily.         Follow-up appointment   TBD   Discharge Condition:    stable   Signed: Trystan Eads D. Harris 05/24/2023, 12:33 PM

## 2023-05-24 NOTE — TOC Transition Note (Signed)
Transition of Care [facility]) - Discharge Note   Patient Details  Name: AADIN LATON MRN: [id number] Date of Birth: 1980-09-18  [facility] CM/SW Contact:  Lanier Clam, RN Phone Number: 05/24/2023, 4:08 PM   Clinical Narrative: d/c LTACH [facility] to manage [facility]. Nsg has rm,report #, she will call [facility]. No further CM needs.     Final next level of care: Long Term Acute Care (LTAC) Barriers to Discharge: No Barriers Identified   Patient Goals and CMS Choice            Discharge Placement                       Discharge Plan and Services Additional resources added to the After Visit Summary for                                       Social Drivers of Health (SDOH) Interventions SDOH Screenings   Food Insecurity: No Food Insecurity (04/09/2023)  Housing: Low Risk  (04/09/2023)  Transportation Needs: No Transportation Needs (04/09/2023)  Utilities: Not At Risk (04/09/2023)  Tobacco Use: High Risk (05/13/2023)     Readmission Risk Interventions     No data to display

## 2023-05-24 NOTE — Evaluation (Signed)
SLP Cancellation Note  Patient Details Name: [name] MRN: [id number] DOB: 1980/12/11   Cancelled treatment:       Reason Eval/Treat Not Completed: Other (comment);Medical issues which prohibited therapy (per communication with RT, pt is off vent this am, tires out very easily and has copious secretions, thus is not ready for PMSV trial, will continue efforts.) Rolena Infante, MS [facility] SLP Acute Rehab Services Office [phone]   Chales Abrahams 05/24/2023, 10:07 AM

## 2023-05-24 NOTE — Progress Notes (Signed)
NAME:  [name], MRN:  [id number], DOB:  [birth year], LOS: 11 ADMISSION DATE:  05/13/2023, CONSULTATION DATE:  05/24/23 REFERRING MD:  Beckey Downing, M.D. CHIEF COMPLAINT:  "Altered Mental Status"  History of Present Illness:  43 y.o. male with end-stage multiple sclerosis and sacral osteomyelitis.  Presented to the emergency department today with altered mental status and fever.  Patient currently resides at a skilled nursing facility and with these changes EMS transported the patient to the ED for evaluation.  Normally patient is able to speak 1 word at a time.  Previously patient was on hospice but family decided so back to a Full Code Status.  Review of the electronic medical record indicates patient was admitted from 12/9 through 04/13/2023 for osteomyelitis of the coccyx and sacrum as well as a stage IV sacral decubitus ulcer.  Patient was treated with antibiotics which were stopped at the time of discharge.  Palliative Care Medicine was planned to continue following at that time.  Following arrival patient was evaluated and admitted by hospitalist service.  Shortly after the admission ED provider was called to the patient's room with hypoxia and respiratory distress.  Patient under went bag mask ventilation and then was placed on BiPAP for respiratory support.  After contacting the patient's family/brother the patient was intubated this evening in the emergency department by the ED provider.  With concern for sepsis patient was given bolus IV fluid with lactated Ringer's and empiric antibiotic therapy with vancomycin, Flagyl, and cefepime. I personally spoke with the patient's bedside nurse and he remains febrile despite receiving Tylenol. Patient notably still has a sacral decubitus ulcer and the beginning of another ulceration on his gluteus noted by nursing staff.  Pertinent  Medical History  N/A  Significant Hospital Events: Including procedures, antibiotic start and stop dates in addition to  other pertinent events   1/12 - Presented to ED & family revoked hospice status >> intubated by EDP >> PCCM consulted for admission 1/15 PSV trial for a couple of hours, copious secretions 1/17 extubated 1/18 re-intubated due to increased HR, work of breathing and increased secretions 1/20 cxr improved. Good Ve w/ PSV 5. No accessory use spoke w/ brother given concern about cough and airway protection decided to electively trach vs extubate and potentially fail req re-intubation and trach 1/21 trach  1/22 no issues overnight, Tolerated ATC ~9hrs 1/23 Placed on ATC at 0800, no issues overnight   Interim History / Subjective:  Seen lying in bed on vent via trach   Objective   Blood pressure (!) 140/73, pulse (!) 112, temperature 100 F (37.8 C), temperature source Axillary, resp. rate (!) 27, height 5\' 10"  (1.778 m), weight 72.4 kg, SpO2 100%.    Vent Mode: PRVC FiO2 (%):  [30 %-40 %] 35 % Set Rate:  [20 bmp] 20 bmp Vt Set:  [430 mL] 430 mL PEEP:  [5 cmH20] 5 cmH20 Plateau Pressure:  [12 cmH20-16 cmH20] 13 cmH20   Intake/Output Summary (Last 24 hours) at 05/24/2023 1003 Last data filed at 05/24/2023 7564 Gross per 24 hour  Intake 797.86 ml  Output 1060 ml  Net -262.14 ml   Filed Weights   05/22/23 0500 05/23/23 0500 05/24/23 0500  Weight: 65.5 kg 66.3 kg 72.4 kg    Examination: General: Chronically ill appearing adult male lying in bed on mechanical ventilation via trach, in NAD HEENT: ETT, MM pink/moist, PERRL,  Neuro: Opens eyes to verbal stimuli, able to follow simple commands CV: s1s2 regular  rate and rhythm, no murmur, rubs, or gallops,  PULM:  Clear to auscultation, no increased work of breathing, no added breath sounds  GI: soft, bowel sounds active in all 4 quadrants, non-tender, non-distended, tolerating TF Extremities: warm/dry, no edema  Skin: no rashes or lesions, multiple sacral ulcer   Resolved Hospital Problem list   Lactic Acidosis Acute Metabolic  Encephalopathy Sepsis  Elevated Liver Enzymes  Assessment & Plan:   Acute Hypoxemic respiratory Failure due to PNA and mucous plugging/atelectasis in setting of neuromuscular disorder -Initial sputum + PSA (Pan sens), trached 1/21 -Placed on ATC am 1/22 at 0825 P: Continue daily trials of ATC, will likely require nocturnal vent support  Continue ventilator support with lung protective strategies  Wean PEEP and FiO2 for sats greater than 90%. Head of bed elevated 30 degrees. Plateau pressures less than 30 cm H20.  Follow intermittent chest x-ray and ABG.   SAT/SBT as tolerated, mentation preclude extubation  Ensure adequate pulmonary hygiene  Follow cultures  VAP bundle in place  PAD protocol  Stage 4 sacral Decubitus Ulcer with history of osteomyelitis Stage II pressure ulcer to left hip Stage I pressure ulcer left heel Stage I pressure ulcer right heel All pressure ulcers were present on admission  -Wound care consulted, recommendations for care provided in note written 1/13 P: Care per WOC reccs  Pressure alleviating devices  Optimize nutrition   Type 2 diabetes  P: Continue SSI and long acting insulin CBG goal 140-180 CBG checks ACHS   End Stage Multiple Sclerosis - generalized weakness and multiple skin ulcerations and wounds from pressure injuries present on admission P: Ongoing GOC discussion  Will need SNF vs LTAC at discharge   Moderate Protein Calorie Malnutrition P: Optimize nutrition as able   Best Practice (right click and "Reselect all SmartList Selections" daily)   Diet/type: tubefeeds DVT prophylaxis SCD Pressure ulcer(s): present on admission  GI prophylaxis: H2B Lines: yes and it is still needed Foley:  Yes, and it is still needed Code Status:  full code Last date of multidisciplinary goals of care discussion 1/18, talked with [name] - brother. Update provided, plan to meet with palliative care and patient's daughter along with his brother  to formulate long term plan  CRITICAL CARE Performed by: Judith Campillo D. Harris   Total critical care time: 37 minutes  Critical care time was exclusive of separately billable procedures and treating other patients.  Critical care was necessary to treat or prevent imminent or life-threatening deterioration.  Critical care was time spent personally by me on the following activities: development of treatment plan with patient and/or surrogate as well as nursing, discussions with consultants, evaluation of patient's response to treatment, examination of patient, obtaining history from patient or surrogate, ordering and performing treatments and interventions, ordering and review of laboratory studies, ordering and review of radiographic studies, pulse oximetry and re-evaluation of patient's condition.  Kem Hensen D. Harris, NP-C [facility] Personal contact information can be found on Amion  If no contact or response made please call [phone] 05/24/2023, 10:03 AM

## 2023-05-25 DIAGNOSIS — G35 Multiple sclerosis: Secondary | ICD-10-CM

## 2023-05-25 DIAGNOSIS — R1312 Dysphagia, oropharyngeal phase: Secondary | ICD-10-CM

## 2023-05-25 DIAGNOSIS — L89154 Pressure ulcer of sacral region, stage 4: Secondary | ICD-10-CM

## 2023-05-25 DIAGNOSIS — R1310 Dysphagia, unspecified: Secondary | ICD-10-CM

## 2023-05-25 DIAGNOSIS — J9621 Acute and chronic respiratory failure with hypoxia: Secondary | ICD-10-CM

## 2023-05-25 DIAGNOSIS — Z930 Tracheostomy status: Secondary | ICD-10-CM

## 2023-05-25 DIAGNOSIS — J9601 Acute respiratory failure with hypoxia: Secondary | ICD-10-CM

## 2023-05-26 DIAGNOSIS — J9601 Acute respiratory failure with hypoxia: Secondary | ICD-10-CM

## 2023-05-26 DIAGNOSIS — R1310 Dysphagia, unspecified: Secondary | ICD-10-CM

## 2023-05-26 DIAGNOSIS — R1312 Dysphagia, oropharyngeal phase: Secondary | ICD-10-CM

## 2023-05-26 DIAGNOSIS — J9621 Acute and chronic respiratory failure with hypoxia: Secondary | ICD-10-CM

## 2023-05-26 DIAGNOSIS — G35 Multiple sclerosis: Secondary | ICD-10-CM

## 2023-05-26 DIAGNOSIS — Z930 Tracheostomy status: Secondary | ICD-10-CM

## 2023-05-26 DIAGNOSIS — L89154 Pressure ulcer of sacral region, stage 4: Secondary | ICD-10-CM

## 2023-05-27 DIAGNOSIS — Z930 Tracheostomy status: Secondary | ICD-10-CM

## 2023-05-27 DIAGNOSIS — J9621 Acute and chronic respiratory failure with hypoxia: Secondary | ICD-10-CM

## 2023-05-27 DIAGNOSIS — R1310 Dysphagia, unspecified: Secondary | ICD-10-CM

## 2023-05-27 DIAGNOSIS — G35 Multiple sclerosis: Secondary | ICD-10-CM

## 2023-05-28 DIAGNOSIS — J9601 Acute respiratory failure with hypoxia: Secondary | ICD-10-CM

## 2023-05-28 DIAGNOSIS — L89154 Pressure ulcer of sacral region, stage 4: Secondary | ICD-10-CM

## 2023-05-28 DIAGNOSIS — R1312 Dysphagia, oropharyngeal phase: Secondary | ICD-10-CM

## 2023-05-28 DIAGNOSIS — G35 Multiple sclerosis: Secondary | ICD-10-CM

## 2023-06-01 DIAGNOSIS — R1310 Dysphagia, unspecified: Secondary | ICD-10-CM

## 2023-06-01 DIAGNOSIS — G35 Multiple sclerosis: Secondary | ICD-10-CM

## 2023-06-01 DIAGNOSIS — J9621 Acute and chronic respiratory failure with hypoxia: Secondary | ICD-10-CM

## 2023-06-01 DIAGNOSIS — Z930 Tracheostomy status: Secondary | ICD-10-CM

## 2023-06-02 DIAGNOSIS — Z930 Tracheostomy status: Secondary | ICD-10-CM

## 2023-06-02 DIAGNOSIS — J9621 Acute and chronic respiratory failure with hypoxia: Secondary | ICD-10-CM

## 2023-06-02 DIAGNOSIS — G35 Multiple sclerosis: Secondary | ICD-10-CM

## 2023-06-02 DIAGNOSIS — R1310 Dysphagia, unspecified: Secondary | ICD-10-CM

## 2023-06-03 DIAGNOSIS — R1310 Dysphagia, unspecified: Secondary | ICD-10-CM

## 2023-06-03 DIAGNOSIS — Z930 Tracheostomy status: Secondary | ICD-10-CM

## 2023-06-03 DIAGNOSIS — J9621 Acute and chronic respiratory failure with hypoxia: Secondary | ICD-10-CM

## 2023-06-03 DIAGNOSIS — G35 Multiple sclerosis: Secondary | ICD-10-CM

## 2023-06-04 DIAGNOSIS — R1312 Dysphagia, oropharyngeal phase: Secondary | ICD-10-CM

## 2023-06-04 DIAGNOSIS — G35 Multiple sclerosis: Secondary | ICD-10-CM

## 2023-06-04 DIAGNOSIS — L89154 Pressure ulcer of sacral region, stage 4: Secondary | ICD-10-CM

## 2023-06-04 DIAGNOSIS — J9621 Acute and chronic respiratory failure with hypoxia: Secondary | ICD-10-CM

## 2023-06-04 DIAGNOSIS — R1310 Dysphagia, unspecified: Secondary | ICD-10-CM

## 2023-06-04 DIAGNOSIS — Z930 Tracheostomy status: Secondary | ICD-10-CM

## 2023-06-04 DIAGNOSIS — J9601 Acute respiratory failure with hypoxia: Secondary | ICD-10-CM

## 2023-06-05 DIAGNOSIS — R1310 Dysphagia, unspecified: Secondary | ICD-10-CM

## 2023-06-05 DIAGNOSIS — G35 Multiple sclerosis: Secondary | ICD-10-CM

## 2023-06-05 DIAGNOSIS — Z930 Tracheostomy status: Secondary | ICD-10-CM

## 2023-06-05 DIAGNOSIS — J9621 Acute and chronic respiratory failure with hypoxia: Secondary | ICD-10-CM

## 2023-06-06 DIAGNOSIS — J9621 Acute and chronic respiratory failure with hypoxia: Secondary | ICD-10-CM

## 2023-06-06 DIAGNOSIS — R1310 Dysphagia, unspecified: Secondary | ICD-10-CM

## 2023-06-06 DIAGNOSIS — Z930 Tracheostomy status: Secondary | ICD-10-CM

## 2023-06-06 DIAGNOSIS — G35 Multiple sclerosis: Secondary | ICD-10-CM

## 2023-06-07 DIAGNOSIS — G35 Multiple sclerosis: Secondary | ICD-10-CM

## 2023-06-07 DIAGNOSIS — R1310 Dysphagia, unspecified: Secondary | ICD-10-CM

## 2023-06-07 DIAGNOSIS — J9621 Acute and chronic respiratory failure with hypoxia: Secondary | ICD-10-CM

## 2023-06-07 DIAGNOSIS — Z930 Tracheostomy status: Secondary | ICD-10-CM

## 2023-06-09 DIAGNOSIS — L89154 Pressure ulcer of sacral region, stage 4: Secondary | ICD-10-CM

## 2023-06-09 DIAGNOSIS — G35 Multiple sclerosis: Secondary | ICD-10-CM

## 2023-06-09 DIAGNOSIS — R1312 Dysphagia, oropharyngeal phase: Secondary | ICD-10-CM

## 2023-06-09 DIAGNOSIS — J9601 Acute respiratory failure with hypoxia: Secondary | ICD-10-CM

## 2023-06-11 DIAGNOSIS — J9601 Acute respiratory failure with hypoxia: Secondary | ICD-10-CM

## 2023-06-11 DIAGNOSIS — R1312 Dysphagia, oropharyngeal phase: Secondary | ICD-10-CM

## 2023-06-11 DIAGNOSIS — L89154 Pressure ulcer of sacral region, stage 4: Secondary | ICD-10-CM

## 2023-06-11 DIAGNOSIS — G35 Multiple sclerosis: Secondary | ICD-10-CM

## 2023-06-12 DIAGNOSIS — J9601 Acute respiratory failure with hypoxia: Secondary | ICD-10-CM

## 2023-06-12 DIAGNOSIS — R1312 Dysphagia, oropharyngeal phase: Secondary | ICD-10-CM

## 2023-06-12 DIAGNOSIS — G35 Multiple sclerosis: Secondary | ICD-10-CM

## 2023-06-12 DIAGNOSIS — L89154 Pressure ulcer of sacral region, stage 4: Secondary | ICD-10-CM

## 2023-06-18 DIAGNOSIS — Z930 Tracheostomy status: Secondary | ICD-10-CM

## 2023-06-18 DIAGNOSIS — J9601 Acute respiratory failure with hypoxia: Secondary | ICD-10-CM

## 2023-06-18 DIAGNOSIS — R1310 Dysphagia, unspecified: Secondary | ICD-10-CM

## 2023-06-18 DIAGNOSIS — J9621 Acute and chronic respiratory failure with hypoxia: Secondary | ICD-10-CM

## 2023-06-18 DIAGNOSIS — R1312 Dysphagia, oropharyngeal phase: Secondary | ICD-10-CM

## 2023-06-18 DIAGNOSIS — G35 Multiple sclerosis: Secondary | ICD-10-CM

## 2023-06-18 DIAGNOSIS — L89154 Pressure ulcer of sacral region, stage 4: Secondary | ICD-10-CM

## 2023-06-19 DIAGNOSIS — J9621 Acute and chronic respiratory failure with hypoxia: Secondary | ICD-10-CM

## 2023-06-19 DIAGNOSIS — Z930 Tracheostomy status: Secondary | ICD-10-CM

## 2023-06-19 DIAGNOSIS — R1310 Dysphagia, unspecified: Secondary | ICD-10-CM

## 2023-06-19 DIAGNOSIS — G35 Multiple sclerosis: Secondary | ICD-10-CM

## 2023-06-20 DIAGNOSIS — J9621 Acute and chronic respiratory failure with hypoxia: Secondary | ICD-10-CM

## 2023-06-20 DIAGNOSIS — J9601 Acute respiratory failure with hypoxia: Secondary | ICD-10-CM

## 2023-06-20 DIAGNOSIS — R1310 Dysphagia, unspecified: Secondary | ICD-10-CM

## 2023-06-20 DIAGNOSIS — Z930 Tracheostomy status: Secondary | ICD-10-CM

## 2023-06-20 DIAGNOSIS — R1312 Dysphagia, oropharyngeal phase: Secondary | ICD-10-CM

## 2023-06-20 DIAGNOSIS — L89154 Pressure ulcer of sacral region, stage 4: Secondary | ICD-10-CM

## 2023-06-20 DIAGNOSIS — G35 Multiple sclerosis: Secondary | ICD-10-CM

## 2023-06-21 DIAGNOSIS — J9621 Acute and chronic respiratory failure with hypoxia: Secondary | ICD-10-CM

## 2023-06-21 DIAGNOSIS — G35 Multiple sclerosis: Secondary | ICD-10-CM

## 2023-06-21 DIAGNOSIS — R1310 Dysphagia, unspecified: Secondary | ICD-10-CM

## 2023-06-21 DIAGNOSIS — Z930 Tracheostomy status: Secondary | ICD-10-CM

## 2023-06-22 DIAGNOSIS — R1312 Dysphagia, oropharyngeal phase: Secondary | ICD-10-CM

## 2023-06-22 DIAGNOSIS — R1310 Dysphagia, unspecified: Secondary | ICD-10-CM

## 2023-06-22 DIAGNOSIS — G35 Multiple sclerosis: Secondary | ICD-10-CM

## 2023-06-22 DIAGNOSIS — Z930 Tracheostomy status: Secondary | ICD-10-CM

## 2023-06-22 DIAGNOSIS — J9601 Acute respiratory failure with hypoxia: Secondary | ICD-10-CM

## 2023-06-22 DIAGNOSIS — L89154 Pressure ulcer of sacral region, stage 4: Secondary | ICD-10-CM

## 2023-06-22 DIAGNOSIS — J9621 Acute and chronic respiratory failure with hypoxia: Secondary | ICD-10-CM

## 2023-06-23 DIAGNOSIS — J9621 Acute and chronic respiratory failure with hypoxia: Secondary | ICD-10-CM

## 2023-06-23 DIAGNOSIS — Z930 Tracheostomy status: Secondary | ICD-10-CM

## 2023-06-23 DIAGNOSIS — G35 Multiple sclerosis: Secondary | ICD-10-CM

## 2023-06-23 DIAGNOSIS — R1310 Dysphagia, unspecified: Secondary | ICD-10-CM

## 2023-06-24 DIAGNOSIS — G35 Multiple sclerosis: Secondary | ICD-10-CM

## 2023-06-24 DIAGNOSIS — R1310 Dysphagia, unspecified: Secondary | ICD-10-CM

## 2023-06-24 DIAGNOSIS — Z930 Tracheostomy status: Secondary | ICD-10-CM

## 2023-06-24 DIAGNOSIS — J9621 Acute and chronic respiratory failure with hypoxia: Secondary | ICD-10-CM

## 2023-06-25 DIAGNOSIS — J9601 Acute respiratory failure with hypoxia: Secondary | ICD-10-CM

## 2023-06-25 DIAGNOSIS — R1312 Dysphagia, oropharyngeal phase: Secondary | ICD-10-CM

## 2023-06-25 DIAGNOSIS — Z930 Tracheostomy status: Secondary | ICD-10-CM

## 2023-06-25 DIAGNOSIS — L89154 Pressure ulcer of sacral region, stage 4: Secondary | ICD-10-CM

## 2023-06-25 DIAGNOSIS — G35 Multiple sclerosis: Secondary | ICD-10-CM

## 2023-06-25 DIAGNOSIS — R1310 Dysphagia, unspecified: Secondary | ICD-10-CM

## 2023-06-25 DIAGNOSIS — J9621 Acute and chronic respiratory failure with hypoxia: Secondary | ICD-10-CM

## 2023-06-26 DIAGNOSIS — Z930 Tracheostomy status: Secondary | ICD-10-CM

## 2023-06-26 DIAGNOSIS — G35 Multiple sclerosis: Secondary | ICD-10-CM

## 2023-06-26 DIAGNOSIS — R1310 Dysphagia, unspecified: Secondary | ICD-10-CM

## 2023-06-26 DIAGNOSIS — J9621 Acute and chronic respiratory failure with hypoxia: Secondary | ICD-10-CM

## 2023-06-27 DIAGNOSIS — Z930 Tracheostomy status: Secondary | ICD-10-CM

## 2023-06-27 DIAGNOSIS — R1310 Dysphagia, unspecified: Secondary | ICD-10-CM

## 2023-06-27 DIAGNOSIS — G35 Multiple sclerosis: Secondary | ICD-10-CM

## 2023-06-27 DIAGNOSIS — J9621 Acute and chronic respiratory failure with hypoxia: Secondary | ICD-10-CM

## 2023-06-28 DIAGNOSIS — Z930 Tracheostomy status: Secondary | ICD-10-CM

## 2023-06-28 DIAGNOSIS — J9621 Acute and chronic respiratory failure with hypoxia: Secondary | ICD-10-CM

## 2023-06-28 DIAGNOSIS — R1310 Dysphagia, unspecified: Secondary | ICD-10-CM

## 2023-06-28 DIAGNOSIS — G35 Multiple sclerosis: Secondary | ICD-10-CM

## 2023-07-07 DIAGNOSIS — G35 Multiple sclerosis: Secondary | ICD-10-CM

## 2023-07-07 DIAGNOSIS — R1312 Dysphagia, oropharyngeal phase: Secondary | ICD-10-CM

## 2023-07-07 DIAGNOSIS — J9601 Acute respiratory failure with hypoxia: Secondary | ICD-10-CM

## 2023-07-07 DIAGNOSIS — L89154 Pressure ulcer of sacral region, stage 4: Secondary | ICD-10-CM

## 2023-07-09 DIAGNOSIS — J9601 Acute respiratory failure with hypoxia: Secondary | ICD-10-CM

## 2023-07-09 DIAGNOSIS — R1312 Dysphagia, oropharyngeal phase: Secondary | ICD-10-CM

## 2023-07-09 DIAGNOSIS — L89154 Pressure ulcer of sacral region, stage 4: Secondary | ICD-10-CM

## 2023-07-09 DIAGNOSIS — G35 Multiple sclerosis: Secondary | ICD-10-CM

## 2023-07-12 DIAGNOSIS — G35 Multiple sclerosis: Secondary | ICD-10-CM | POA: Diagnosis not present

## 2023-07-12 DIAGNOSIS — L98429 Non-pressure chronic ulcer of back with unspecified severity: Secondary | ICD-10-CM | POA: Diagnosis not present

## 2023-07-12 DIAGNOSIS — R5381 Other malaise: Secondary | ICD-10-CM

## 2023-07-12 DIAGNOSIS — J9601 Acute respiratory failure with hypoxia: Secondary | ICD-10-CM | POA: Diagnosis not present

## 2023-07-12 DIAGNOSIS — I2782 Chronic pulmonary embolism: Secondary | ICD-10-CM | POA: Diagnosis not present

## 2023-07-17 ENCOUNTER — Other Ambulatory Visit: Payer: Self-pay | Admitting: Internal Medicine

## 2023-07-24 DIAGNOSIS — G35 Multiple sclerosis: Secondary | ICD-10-CM | POA: Diagnosis not present

## 2023-07-24 DIAGNOSIS — J9601 Acute respiratory failure with hypoxia: Secondary | ICD-10-CM | POA: Diagnosis not present

## 2023-07-24 DIAGNOSIS — L98429 Non-pressure chronic ulcer of back with unspecified severity: Secondary | ICD-10-CM | POA: Diagnosis not present

## 2023-07-24 DIAGNOSIS — I2782 Chronic pulmonary embolism: Secondary | ICD-10-CM | POA: Diagnosis not present

## 2023-07-24 DIAGNOSIS — R5381 Other malaise: Secondary | ICD-10-CM

## 2023-08-01 DIAGNOSIS — J9601 Acute respiratory failure with hypoxia: Secondary | ICD-10-CM | POA: Diagnosis not present

## 2023-08-01 DIAGNOSIS — G35 Multiple sclerosis: Secondary | ICD-10-CM | POA: Diagnosis not present

## 2023-08-01 DIAGNOSIS — L98429 Non-pressure chronic ulcer of back with unspecified severity: Secondary | ICD-10-CM | POA: Diagnosis not present

## 2023-08-01 DIAGNOSIS — R5381 Other malaise: Secondary | ICD-10-CM

## 2023-08-01 DIAGNOSIS — I2782 Chronic pulmonary embolism: Secondary | ICD-10-CM | POA: Diagnosis not present

## 2023-08-07 DIAGNOSIS — I2782 Chronic pulmonary embolism: Secondary | ICD-10-CM | POA: Diagnosis not present

## 2023-08-07 DIAGNOSIS — G35 Multiple sclerosis: Secondary | ICD-10-CM | POA: Diagnosis not present

## 2023-08-07 DIAGNOSIS — L98429 Non-pressure chronic ulcer of back with unspecified severity: Secondary | ICD-10-CM | POA: Diagnosis not present

## 2023-08-07 DIAGNOSIS — R5381 Other malaise: Secondary | ICD-10-CM

## 2023-08-07 DIAGNOSIS — J9601 Acute respiratory failure with hypoxia: Secondary | ICD-10-CM | POA: Diagnosis not present

## 2023-08-14 ENCOUNTER — Other Ambulatory Visit: Payer: Self-pay | Admitting: Internal Medicine

## 2023-08-14 DIAGNOSIS — I2782 Chronic pulmonary embolism: Secondary | ICD-10-CM | POA: Diagnosis not present

## 2023-08-14 DIAGNOSIS — J9601 Acute respiratory failure with hypoxia: Secondary | ICD-10-CM | POA: Diagnosis not present

## 2023-08-14 DIAGNOSIS — G35 Multiple sclerosis: Secondary | ICD-10-CM | POA: Diagnosis not present

## 2023-08-14 DIAGNOSIS — L98429 Non-pressure chronic ulcer of back with unspecified severity: Secondary | ICD-10-CM | POA: Diagnosis not present

## 2023-08-14 DIAGNOSIS — R5381 Other malaise: Secondary | ICD-10-CM

## 2023-08-21 DIAGNOSIS — L98429 Non-pressure chronic ulcer of back with unspecified severity: Secondary | ICD-10-CM | POA: Diagnosis not present

## 2023-08-21 DIAGNOSIS — I2782 Chronic pulmonary embolism: Secondary | ICD-10-CM | POA: Diagnosis not present

## 2023-08-21 DIAGNOSIS — G35 Multiple sclerosis: Secondary | ICD-10-CM | POA: Diagnosis not present

## 2023-08-21 DIAGNOSIS — R5381 Other malaise: Secondary | ICD-10-CM

## 2023-08-21 DIAGNOSIS — J9601 Acute respiratory failure with hypoxia: Secondary | ICD-10-CM | POA: Diagnosis not present

## 2023-08-28 DIAGNOSIS — I2782 Chronic pulmonary embolism: Secondary | ICD-10-CM | POA: Diagnosis not present

## 2023-08-28 DIAGNOSIS — L98429 Non-pressure chronic ulcer of back with unspecified severity: Secondary | ICD-10-CM | POA: Diagnosis not present

## 2023-08-28 DIAGNOSIS — J9601 Acute respiratory failure with hypoxia: Secondary | ICD-10-CM | POA: Diagnosis not present

## 2023-08-28 DIAGNOSIS — R5381 Other malaise: Secondary | ICD-10-CM | POA: Diagnosis not present

## 2023-09-04 DIAGNOSIS — R5381 Other malaise: Secondary | ICD-10-CM | POA: Diagnosis not present

## 2023-09-04 DIAGNOSIS — J9601 Acute respiratory failure with hypoxia: Secondary | ICD-10-CM | POA: Diagnosis not present

## 2023-09-04 DIAGNOSIS — L98429 Non-pressure chronic ulcer of back with unspecified severity: Secondary | ICD-10-CM | POA: Diagnosis not present

## 2023-09-04 DIAGNOSIS — I2782 Chronic pulmonary embolism: Secondary | ICD-10-CM | POA: Diagnosis not present

## 2023-09-11 ENCOUNTER — Other Ambulatory Visit: Payer: Self-pay | Admitting: Internal Medicine

## 2023-09-11 DIAGNOSIS — I2782 Chronic pulmonary embolism: Secondary | ICD-10-CM | POA: Diagnosis not present

## 2023-09-11 DIAGNOSIS — L98429 Non-pressure chronic ulcer of back with unspecified severity: Secondary | ICD-10-CM | POA: Diagnosis not present

## 2023-09-11 DIAGNOSIS — G35 Multiple sclerosis: Secondary | ICD-10-CM

## 2023-09-11 DIAGNOSIS — J9601 Acute respiratory failure with hypoxia: Secondary | ICD-10-CM | POA: Diagnosis not present

## 2023-09-11 DIAGNOSIS — R5381 Other malaise: Secondary | ICD-10-CM | POA: Diagnosis not present

## 2023-09-18 DIAGNOSIS — I2782 Chronic pulmonary embolism: Secondary | ICD-10-CM

## 2023-09-18 DIAGNOSIS — A419 Sepsis, unspecified organism: Secondary | ICD-10-CM | POA: Diagnosis not present

## 2023-09-18 DIAGNOSIS — J9601 Acute respiratory failure with hypoxia: Secondary | ICD-10-CM | POA: Diagnosis not present

## 2023-09-18 DIAGNOSIS — L98429 Non-pressure chronic ulcer of back with unspecified severity: Secondary | ICD-10-CM | POA: Diagnosis not present

## 2023-09-18 DIAGNOSIS — G35 Multiple sclerosis: Secondary | ICD-10-CM

## 2023-09-18 DIAGNOSIS — R5381 Other malaise: Secondary | ICD-10-CM

## 2023-09-18 DIAGNOSIS — R7881 Bacteremia: Secondary | ICD-10-CM | POA: Diagnosis not present

## 2023-09-24 DIAGNOSIS — R7881 Bacteremia: Secondary | ICD-10-CM | POA: Diagnosis not present

## 2023-09-24 DIAGNOSIS — J9601 Acute respiratory failure with hypoxia: Secondary | ICD-10-CM | POA: Diagnosis not present

## 2023-09-24 DIAGNOSIS — I2782 Chronic pulmonary embolism: Secondary | ICD-10-CM

## 2023-09-24 DIAGNOSIS — L98429 Non-pressure chronic ulcer of back with unspecified severity: Secondary | ICD-10-CM | POA: Diagnosis not present

## 2023-09-24 DIAGNOSIS — G35 Multiple sclerosis: Secondary | ICD-10-CM

## 2023-09-24 DIAGNOSIS — R5381 Other malaise: Secondary | ICD-10-CM

## 2023-09-24 DIAGNOSIS — A419 Sepsis, unspecified organism: Secondary | ICD-10-CM | POA: Diagnosis not present

## 2023-10-02 DIAGNOSIS — L98429 Non-pressure chronic ulcer of back with unspecified severity: Secondary | ICD-10-CM

## 2023-10-02 DIAGNOSIS — I2782 Chronic pulmonary embolism: Secondary | ICD-10-CM

## 2023-10-02 DIAGNOSIS — G35 Multiple sclerosis: Secondary | ICD-10-CM

## 2023-10-02 DIAGNOSIS — J9601 Acute respiratory failure with hypoxia: Secondary | ICD-10-CM | POA: Diagnosis not present

## 2023-10-02 DIAGNOSIS — A419 Sepsis, unspecified organism: Secondary | ICD-10-CM

## 2023-10-02 DIAGNOSIS — R5381 Other malaise: Secondary | ICD-10-CM

## 2023-10-02 DIAGNOSIS — R7881 Bacteremia: Secondary | ICD-10-CM

## 2023-10-09 DIAGNOSIS — G35 Multiple sclerosis: Secondary | ICD-10-CM | POA: Diagnosis not present

## 2023-10-09 DIAGNOSIS — R5381 Other malaise: Secondary | ICD-10-CM

## 2023-10-09 DIAGNOSIS — J9601 Acute respiratory failure with hypoxia: Secondary | ICD-10-CM | POA: Diagnosis not present

## 2023-10-09 DIAGNOSIS — L98429 Non-pressure chronic ulcer of back with unspecified severity: Secondary | ICD-10-CM | POA: Diagnosis not present

## 2023-10-09 DIAGNOSIS — I2782 Chronic pulmonary embolism: Secondary | ICD-10-CM | POA: Diagnosis not present

## 2023-10-16 ENCOUNTER — Other Ambulatory Visit: Payer: Self-pay | Admitting: Internal Medicine

## 2023-10-16 DIAGNOSIS — J9601 Acute respiratory failure with hypoxia: Secondary | ICD-10-CM | POA: Diagnosis not present

## 2023-10-16 DIAGNOSIS — R5381 Other malaise: Secondary | ICD-10-CM

## 2023-10-16 DIAGNOSIS — I2782 Chronic pulmonary embolism: Secondary | ICD-10-CM | POA: Diagnosis not present

## 2023-10-16 DIAGNOSIS — L98429 Non-pressure chronic ulcer of back with unspecified severity: Secondary | ICD-10-CM | POA: Diagnosis not present

## 2023-10-16 DIAGNOSIS — G35 Multiple sclerosis: Secondary | ICD-10-CM | POA: Diagnosis not present

## 2023-10-23 DIAGNOSIS — R5381 Other malaise: Secondary | ICD-10-CM

## 2023-10-23 DIAGNOSIS — J9601 Acute respiratory failure with hypoxia: Secondary | ICD-10-CM | POA: Diagnosis not present

## 2023-10-23 DIAGNOSIS — I2782 Chronic pulmonary embolism: Secondary | ICD-10-CM | POA: Diagnosis not present

## 2023-10-23 DIAGNOSIS — G35 Multiple sclerosis: Secondary | ICD-10-CM | POA: Diagnosis not present

## 2023-10-23 DIAGNOSIS — L98429 Non-pressure chronic ulcer of back with unspecified severity: Secondary | ICD-10-CM | POA: Diagnosis not present

## 2023-10-30 DIAGNOSIS — I2782 Chronic pulmonary embolism: Secondary | ICD-10-CM | POA: Diagnosis not present

## 2023-10-30 DIAGNOSIS — J9601 Acute respiratory failure with hypoxia: Secondary | ICD-10-CM | POA: Diagnosis not present

## 2023-10-30 DIAGNOSIS — L98429 Non-pressure chronic ulcer of back with unspecified severity: Secondary | ICD-10-CM | POA: Diagnosis not present

## 2023-10-30 DIAGNOSIS — R5381 Other malaise: Secondary | ICD-10-CM

## 2023-10-30 DIAGNOSIS — G35 Multiple sclerosis: Secondary | ICD-10-CM | POA: Diagnosis not present

## 2023-11-01 ENCOUNTER — Other Ambulatory Visit: Payer: Self-pay | Admitting: Internal Medicine

## 2023-11-06 DIAGNOSIS — G35 Multiple sclerosis: Secondary | ICD-10-CM | POA: Diagnosis not present

## 2023-11-06 DIAGNOSIS — I2782 Chronic pulmonary embolism: Secondary | ICD-10-CM | POA: Diagnosis not present

## 2023-11-06 DIAGNOSIS — L98429 Non-pressure chronic ulcer of back with unspecified severity: Secondary | ICD-10-CM | POA: Diagnosis not present

## 2023-11-06 DIAGNOSIS — R5381 Other malaise: Secondary | ICD-10-CM

## 2023-11-06 DIAGNOSIS — J9601 Acute respiratory failure with hypoxia: Secondary | ICD-10-CM | POA: Diagnosis not present

## 2023-11-13 DIAGNOSIS — R5381 Other malaise: Secondary | ICD-10-CM

## 2023-11-13 DIAGNOSIS — I2782 Chronic pulmonary embolism: Secondary | ICD-10-CM | POA: Diagnosis not present

## 2023-11-13 DIAGNOSIS — J9601 Acute respiratory failure with hypoxia: Secondary | ICD-10-CM | POA: Diagnosis not present

## 2023-11-13 DIAGNOSIS — L98429 Non-pressure chronic ulcer of back with unspecified severity: Secondary | ICD-10-CM | POA: Diagnosis not present

## 2023-11-13 DIAGNOSIS — G35 Multiple sclerosis: Secondary | ICD-10-CM | POA: Diagnosis not present

## 2023-11-20 DIAGNOSIS — L98429 Non-pressure chronic ulcer of back with unspecified severity: Secondary | ICD-10-CM | POA: Diagnosis not present

## 2023-11-20 DIAGNOSIS — G35 Multiple sclerosis: Secondary | ICD-10-CM | POA: Diagnosis not present

## 2023-11-20 DIAGNOSIS — J9601 Acute respiratory failure with hypoxia: Secondary | ICD-10-CM | POA: Diagnosis not present

## 2023-11-20 DIAGNOSIS — I2782 Chronic pulmonary embolism: Secondary | ICD-10-CM | POA: Diagnosis not present

## 2023-11-20 DIAGNOSIS — R5381 Other malaise: Secondary | ICD-10-CM

## 2023-11-21 ENCOUNTER — Other Ambulatory Visit: Payer: Self-pay | Admitting: Internal Medicine

## 2023-11-23 ENCOUNTER — Other Ambulatory Visit: Payer: Self-pay

## 2023-11-23 ENCOUNTER — Inpatient Hospital Stay (HOSPITAL_COMMUNITY)
Admission: EM | Admit: 2023-11-23 | Discharge: 2023-11-26 | DRG: 919 | Disposition: A | Discharge status: Skilled Nursing Facility | Source: Skilled Nursing Facility | Attending: Family Medicine | Admitting: Family Medicine

## 2023-11-23 ENCOUNTER — Encounter (HOSPITAL_COMMUNITY): Payer: Self-pay

## 2023-11-23 DIAGNOSIS — R54 Age-related physical debility: Secondary | ICD-10-CM | POA: Diagnosis present

## 2023-11-23 DIAGNOSIS — Z8249 Family history of ischemic heart disease and other diseases of the circulatory system: Secondary | ICD-10-CM

## 2023-11-23 DIAGNOSIS — T148XXA Other injury of unspecified body region, initial encounter: Secondary | ICD-10-CM | POA: Diagnosis not present

## 2023-11-23 DIAGNOSIS — L7622 Postprocedural hemorrhage and hematoma of skin and subcutaneous tissue following other procedure: Principal | ICD-10-CM | POA: Diagnosis present

## 2023-11-23 DIAGNOSIS — D500 Iron deficiency anemia secondary to blood loss (chronic): Principal | ICD-10-CM

## 2023-11-23 DIAGNOSIS — Y848 Other medical procedures as the cause of abnormal reaction of the patient, or of later complication, without mention of misadventure at the time of the procedure: Secondary | ICD-10-CM | POA: Diagnosis present

## 2023-11-23 DIAGNOSIS — Z7401 Bed confinement status: Secondary | ICD-10-CM

## 2023-11-23 DIAGNOSIS — Z681 Body mass index (BMI) 19 or less, adult: Secondary | ICD-10-CM

## 2023-11-23 DIAGNOSIS — I10 Essential (primary) hypertension: Secondary | ICD-10-CM | POA: Diagnosis present

## 2023-11-23 DIAGNOSIS — Z930 Tracheostomy status: Secondary | ICD-10-CM

## 2023-11-23 DIAGNOSIS — G35 Multiple sclerosis: Secondary | ICD-10-CM | POA: Diagnosis present

## 2023-11-23 DIAGNOSIS — D6832 Hemorrhagic disorder due to extrinsic circulating anticoagulants: Secondary | ICD-10-CM | POA: Diagnosis present

## 2023-11-23 DIAGNOSIS — D62 Acute posthemorrhagic anemia: Secondary | ICD-10-CM | POA: Diagnosis present

## 2023-11-23 DIAGNOSIS — Z820 Family history of epilepsy and other diseases of the nervous system: Secondary | ICD-10-CM

## 2023-11-23 DIAGNOSIS — M4628 Osteomyelitis of vertebra, sacral and sacrococcygeal region: Secondary | ICD-10-CM | POA: Diagnosis present

## 2023-11-23 DIAGNOSIS — E43 Unspecified severe protein-calorie malnutrition: Secondary | ICD-10-CM | POA: Diagnosis present

## 2023-11-23 DIAGNOSIS — Z9911 Dependence on respirator [ventilator] status: Secondary | ICD-10-CM

## 2023-11-23 DIAGNOSIS — F17210 Nicotine dependence, cigarettes, uncomplicated: Secondary | ICD-10-CM | POA: Diagnosis present

## 2023-11-23 DIAGNOSIS — Z931 Gastrostomy status: Secondary | ICD-10-CM

## 2023-11-23 DIAGNOSIS — T45515A Adverse effect of anticoagulants, initial encounter: Secondary | ICD-10-CM | POA: Diagnosis present

## 2023-11-23 DIAGNOSIS — E1169 Type 2 diabetes mellitus with other specified complication: Secondary | ICD-10-CM | POA: Diagnosis present

## 2023-11-23 DIAGNOSIS — L89154 Pressure ulcer of sacral region, stage 4: Secondary | ICD-10-CM | POA: Diagnosis present

## 2023-11-23 DIAGNOSIS — Z79899 Other long term (current) drug therapy: Secondary | ICD-10-CM

## 2023-11-23 DIAGNOSIS — J9610 Chronic respiratory failure, unspecified whether with hypoxia or hypercapnia: Secondary | ICD-10-CM | POA: Diagnosis present

## 2023-11-23 DIAGNOSIS — Z794 Long term (current) use of insulin: Secondary | ICD-10-CM

## 2023-11-23 DIAGNOSIS — G8220 Paraplegia, unspecified: Secondary | ICD-10-CM | POA: Diagnosis present

## 2023-11-23 LAB — PROTIME-INR
INR: 1.2 (ref 0.8–1.2)
Prothrombin Time: 15.6 s — ABNORMAL HIGH (ref 11.4–15.2)

## 2023-11-23 LAB — COMPREHENSIVE METABOLIC PANEL WITH GFR
ALT: 54 U/L — ABNORMAL HIGH (ref 0–44)
AST: 36 U/L (ref 15–41)
Albumin: 1.9 g/dL — ABNORMAL LOW (ref 3.5–5.0)
Alkaline Phosphatase: 51 U/L (ref 38–126)
Anion gap: 7 (ref 5–15)
BUN: 13 mg/dL (ref 6–20)
CO2: 23 mmol/L (ref 22–32)
Calcium: 8.7 mg/dL — ABNORMAL LOW (ref 8.9–10.3)
Chloride: 105 mmol/L (ref 98–111)
Creatinine, Ser: 0.32 mg/dL — ABNORMAL LOW (ref 0.61–1.24)
GFR, Estimated: >60 mL/min (ref >60)
Glucose, Bld: 97 mg/dL (ref 70–99)
Potassium: 4 mmol/L (ref 3.5–5.1)
Sodium: 135 mmol/L (ref 135–145)
Total Bilirubin: 0.5 mg/dL (ref 0.0–1.2)
Total Protein: 7 g/dL (ref 6.5–8.1)

## 2023-11-23 LAB — CBC WITH DIFFERENTIAL/PLATELET
Abs Immature Granulocytes: 0 K/uL (ref 0.00–0.07)
Basophils Absolute: 0 K/uL (ref 0.0–0.1)
Basophils Relative: 0 %
Eosinophils Absolute: 0 K/uL (ref 0.0–0.5)
Eosinophils Relative: 1 %
HCT: 24 % — ABNORMAL LOW (ref 39.0–52.0)
Hemoglobin: 7.1 g/dL — ABNORMAL LOW (ref 13.0–17.0)
Lymphocytes Relative: 34 %
Lymphs Abs: 1.7 K/uL (ref 0.7–4.0)
MCH: 23.7 pg — ABNORMAL LOW (ref 26.0–34.0)
MCHC: 29.6 g/dL — ABNORMAL LOW (ref 30.0–36.0)
MCV: 80.3 fL (ref 80.0–100.0)
Monocytes Absolute: 0.3 K/uL (ref 0.1–1.0)
Monocytes Relative: 6 %
Neutro Abs: 2.9 K/uL (ref 1.7–7.7)
Neutrophils Relative %: 59 %
Platelets: 395 K/uL (ref 150–400)
RBC: 2.99 MIL/uL — ABNORMAL LOW (ref 4.22–5.81)
RDW: 16.5 % — ABNORMAL HIGH (ref 11.5–15.5)
WBC: 4.9 K/uL (ref 4.0–10.5)
nRBC: 0 % (ref 0.0–0.2)
nRBC: 0 /100{WBCs}

## 2023-11-23 LAB — HEMOGLOBIN AND HEMATOCRIT, BLOOD
HCT: 24.3 % — ABNORMAL LOW (ref 39.0–52.0)
Hemoglobin: 7.2 g/dL — ABNORMAL LOW (ref 13.0–17.0)

## 2023-11-23 LAB — GLUCOSE, CAPILLARY
Glucose-Capillary: 104 mg/dL — ABNORMAL HIGH (ref 70–99)
Glucose-Capillary: 106 mg/dL — ABNORMAL HIGH (ref 70–99)

## 2023-11-23 LAB — MRSA NEXT GEN BY PCR, NASAL: MRSA by PCR Next Gen: NOT DETECTED

## 2023-11-23 LAB — PREPARE RBC (CROSSMATCH)

## 2023-11-23 LAB — APTT: aPTT: 39 s — ABNORMAL HIGH (ref 24–36)

## 2023-11-23 MED ORDER — FREE WATER
200.0000 mL | Status: DC
  Administered 2023-11-24 – 2023-11-26 (×15): 200 mL

## 2023-11-23 MED ORDER — PROCHLORPERAZINE EDISYLATE 10 MG/2ML IJ SOLN: 5.0000 mg | Freq: Four times a day (QID) | INTRAMUSCULAR | Status: DC | PRN

## 2023-11-23 MED ORDER — ORAL CARE MOUTH RINSE
15.0000 mL | OROMUCOSAL | Status: DC
  Administered 2023-11-24 – 2023-11-26 (×31): 15 mL via OROMUCOSAL

## 2023-11-23 MED ORDER — CHLORHEXIDINE GLUCONATE CLOTH 2 % EX PADS
6.0000 | MEDICATED_PAD | Freq: Every day | CUTANEOUS | Status: DC
  Administered 2023-11-24 – 2023-11-25 (×2): 6 via TOPICAL

## 2023-11-23 MED ORDER — ACETAMINOPHEN 325 MG PO TABS: 650.0000 mg | ORAL_TABLET | Freq: Four times a day (QID) | ORAL | Status: DC | PRN

## 2023-11-23 MED ORDER — VANCOMYCIN HCL 1500 MG/300ML IV SOLN
1500.0000 mg | Freq: Two times a day (BID) | INTRAVENOUS | Status: DC
  Administered 2023-11-24 (×2): 1500 mg via INTRAVENOUS
  Filled 2023-11-23 (×3): qty 300

## 2023-11-23 MED ORDER — OSMOLITE 1.5 CAL PO LIQD
1000.0000 mL | ORAL | Status: DC
  Administered 2023-11-24 – 2023-11-25 (×2): 1000 mL

## 2023-11-23 MED ORDER — SERTRALINE HCL 100 MG PO TABS
100.0000 mg | ORAL_TABLET | Freq: Every day | ORAL | Status: DC
Start: 2023-11-24 — End: 2023-11-26
  Administered 2023-11-24 – 2023-11-26 (×3): 100 mg via ORAL
  Filled 2023-11-23 (×3): qty 1

## 2023-11-23 MED ORDER — PIPERACILLIN-TAZOBACTAM 3.375 G IVPB
3.3750 g | Freq: Three times a day (TID) | INTRAVENOUS | Status: DC
  Administered 2023-11-24 – 2023-11-26 (×8): 3.375 g via INTRAVENOUS
  Filled 2023-11-23 (×7): qty 50

## 2023-11-23 MED ORDER — PROSOURCE TF20 ENFIT COMPATIBL EN LIQD
30.0000 mL | Freq: Two times a day (BID) | ENTERAL | Status: DC
  Administered 2023-11-24 – 2023-11-26 (×6): 30 mL
  Filled 2023-11-23 (×6): qty 60

## 2023-11-23 MED ORDER — ORAL CARE MOUTH RINSE: 15.0000 mL | OROMUCOSAL | Status: DC | PRN

## 2023-11-23 MED ORDER — FAMOTIDINE 20 MG PO TABS
40.0000 mg | ORAL_TABLET | Freq: Two times a day (BID) | ORAL | Status: DC
  Administered 2023-11-24 – 2023-11-26 (×6): 40 mg
  Filled 2023-11-23 (×6): qty 2

## 2023-11-23 MED ORDER — JUVEN PO PACK
1.0000 | PACK | Freq: Two times a day (BID) | ORAL | Status: DC
  Administered 2023-11-24 – 2023-11-26 (×5): 1
  Filled 2023-11-23 (×5): qty 1

## 2023-11-23 MED ORDER — CLONAZEPAM 0.5 MG PO TBDP
1.0000 mg | ORAL_TABLET | Freq: Two times a day (BID) | ORAL | Status: DC
  Administered 2023-11-24 – 2023-11-26 (×6): 1 mg
  Filled 2023-11-23 (×6): qty 2

## 2023-11-23 MED ORDER — SODIUM CHLORIDE 0.9% IV SOLUTION: Freq: Once | INTRAVENOUS | Status: DC

## 2023-11-23 MED ORDER — POLYETHYLENE GLYCOL 3350 17 G PO PACK: 17.0000 g | PACK | Freq: Every day | ORAL | Status: DC | PRN

## 2023-11-23 MED ORDER — OXIDIZED CELLULOSE EX PADS
1.0000 | MEDICATED_PAD | Freq: Once | CUTANEOUS | Status: DC
  Filled 2023-11-23: qty 1

## 2023-11-23 NOTE — ED Triage Notes (Signed)
 Pt BIB [facility] from [facility] with c/o wound bleeding that started today after a dressing change. Pt has a sacral wound that was debrided yesterday and staff changed his dressing today and said that pt had a baseball size clot in dressing and when removed wound started to bleed. Per staff, pt lost around 750cc of blood from wound. VSS. Pt does have trach and is vent dependent.

## 2023-11-23 NOTE — ED Notes (Signed)
 Report given to Joretta Bachelor, RN

## 2023-11-23 NOTE — Progress Notes (Signed)
 11/23/2023 Discussed with EDP Some oozing and anemia and recent sacral wound debridement. Chronic vent dependence and no new issues there. HD stable. Okay for [facility] to admit to ICU (needs due to vent), PCCM will write a note in AM; reach out to [facility] if any issues overnight.  Rolan Sharps MD PCCM

## 2023-11-23 NOTE — Progress Notes (Signed)
 [facility] Physician-Brief Progress Note Patient Name: [name] DOB: 09-25-1980 MRN: [id number]   Date of Service  11/23/2023  HPI/Events of Note  Patient admitted with sacral wound hemorrhage following debridement, he has end stage Multiple Sclerosis and is ventilator dependent.  eICU Interventions  New Patient Evaluation.        [name] 11/23/2023, 10:52 PM

## 2023-11-23 NOTE — ED Provider Notes (Signed)
 [place] EMERGENCY DEPARTMENT AT [facility] Provider Note   CSN: [id number] Arrival date & time: 11/23/23  1622     Patient presents with: Wound Check   [name] is a 43 y.o. male.   HPI 43 year old male presents with a bleeding sacral wound.  He is sent from [facility].  He had his wound debrided yesterday.  When they change the wound today they reported that there was a large amount of blood clot removed and then a large amount of bleeding.  He was sent here via [facility] for evaluation.  All of this history is from the charge nurse to talk to [facility].  Prior to Admission medications   Medication Sig Start Date End Date Taking? Authorizing Provider  acetaminophen  (TYLENOL ) 325 MG tablet Place 650 mg into feeding tube every 8 (eight) hours as needed for fever, mild pain (pain score 1-3) or moderate pain (pain score 4-6).   Yes [provider]  acetaminophen  (TYLENOL ) 325 MG tablet Take 325 mg by mouth every 6 (six) hours as needed for fever.   Yes [provider]  Amino Acids -Protein Hydrolys (PRO-STAT) LIQD Place 30 mLs into feeding tube in the morning and at bedtime.   Yes [provider]  clonazePAM  (KLONOPIN ) 1 MG disintegrating tablet Place 1 tablet (1 mg total) into feeding tube 2 (two) times daily. 05/24/23  Yes Arloa Folks D, NP  doxazosin (CARDURA) 2 MG tablet Take 2 mg by mouth in the morning.   Yes [provider]  enoxaparin  (LOVENOX ) 60 MG/0.6ML injection Inject 60 mg into the skin every 12 (twelve) hours.   Yes [provider]  famotidine  (PEPCID ) 40 MG/5ML suspension Place 40 mg into feeding tube 2 (two) times daily.   Yes [provider]  Glucagon (GVOKE HYPOPEN 1-PACK) 1 MG/0.2ML SOAJ Inject 1 Dose into the skin as needed.   Yes [provider]  Glucagon, rDNA, (GLUCAGON EMERGENCY) 1 MG KIT Inject 1 mg into the muscle as needed.   Yes [provider]  guaiFENesin  200 MG tablet Place  200 mg into feeding tube every 4 (four) hours as needed for cough or to loosen phlegm.   Yes [provider]  insulin  lispro (HUMALOG) 100 UNIT/ML injection Inject 0-10 Units into the skin 3 (three) times daily before meals. Sliding Scale Insulin : Insulin  Units < 70 or >400 Notify MD; 0-150 units, 0 units; 151-200, 2 units; 201-250,  4 units; 251-300, 6 units; 301-350, 8 units; 351-400, 10 units.   Yes [provider]  ipratropium-albuterol  (DUONEB) 0.5-2.5 (3) MG/3ML SOLN Take 3 mLs by nebulization every 6 (six) hours as needed.   Yes [provider]  lidocaine  (XYLOCAINE ) 2 % solution Use as directed 15 mLs in the mouth or throat as needed for mouth pain.   Yes [provider]  Nutritional Supplements (ARGINAID) PACK Place 1 packet into feeding tube in the morning and at bedtime.   Yes [provider]  Nutritional Supplements (FEEDING SUPPLEMENT, OSMOLITE 1.5 CAL,) LIQD Place 1,000 mLs into feeding tube continuous.   Yes [provider]  ondansetron  (ZOFRAN ) 4 MG tablet Place 4 mg into feeding tube as needed for nausea or vomiting.   Yes [provider]  oxyCODONE  (OXY IR/ROXICODONE ) 5 MG immediate release tablet Place 1 tablet (5 mg total) into feeding tube every 6 (six) hours as needed for moderate pain (pain score 4-6). 05/24/23  Yes Arloa Folks D, NP  piperacillin -tazobactam (ZOSYN ) 3.375 GM/50ML IVPB Inject  3.375 g into the vein every 8 (eight) hours. For 14 days 11/19/23  Yes [provider]  propranolol  (INDERAL ) 20 MG tablet Place 1 tablet (20 mg total) into feeding tube 3 (three) times daily. 05/24/23  Yes Harris, Benton D, NP  sertraline  (ZOLOFT ) 100 MG tablet Take 100 mg by mouth in the morning.   Yes [provider]  thiamine  (VITAMIN B-1) 100 MG tablet Place 1 tablet (100 mg total) into feeding tube daily. 02/05/23  Yes Danford, Lonni SQUIBB, MD  Vancomycin  (VANCOCIN ) 1.5 g SOLR injection Inject 1.5 g into  the vein every 12 (twelve) hours. For 14 days 11/20/23  Yes [provider]  Water  For Irrigation, Sterile (FREE WATER ) SOLN Place 200 mLs into feeding tube every 4 (four) hours. 04/13/23   Danford, Lonni SQUIBB, MD    Allergies: Patient has no known allergies.    Review of Systems  Unable to perform ROS: Patient nonverbal    Updated Vital Signs BP (!) 129/90 (BP Location: Right Arm)   Pulse 100   Temp 99.3 F (37.4 C) (Oral)   Resp 19   Ht 5' 10 (1.778 m)   Wt 59 kg   SpO2 100%   BMI 18.65 kg/m   Physical Exam Vitals and nursing note reviewed. Exam conducted with a chaperone present.  Constitutional:      Appearance: He is well-developed.  HENT:     Head: Normocephalic and atraumatic.  Cardiovascular:     Rate and Rhythm: Normal rate.  Pulmonary:     Effort: Pulmonary effort is normal.     Comments: Ventilated through trach Abdominal:     General: There is no distension.  Skin:    General: Skin is warm and dry.     Comments: See picture of 2 of his sacral wounds.  The 1 inferiorly had some blood clot on the dressing but I have not seen any active bleeding since I removed the dressing.  Neurological:     Mental Status: He is alert.     (all labs ordered are listed, but only abnormal results are displayed) Labs Reviewed  COMPREHENSIVE METABOLIC PANEL WITH GFR - Abnormal; Notable for the following components:      Result Value   Creatinine, Ser 0.32 (*)    Calcium 8.7 (*)    Albumin 1.9 (*)    ALT 54 (*)    All other components within normal limits  CBC WITH DIFFERENTIAL/PLATELET - Abnormal; Notable for the following components:   RBC 2.99 (*)    Hemoglobin 7.1 (*)    HCT 24.0 (*)    MCH 23.7 (*)    MCHC 29.6 (*)    RDW 16.5 (*)    All other components within normal limits  PROTIME-INR - Abnormal; Notable for the following components:   Prothrombin Time 15.6 (*)    All other components within normal limits  APTT - Abnormal; Notable for the  following components:   aPTT 39 (*)    All other components within normal limits  HEMOGLOBIN AND HEMATOCRIT, BLOOD - Abnormal; Notable for the following components:   Hemoglobin 7.2 (*)    HCT 24.3 (*)    All other components within normal limits  GLUCOSE, CAPILLARY - Abnormal; Notable for the following components:   Glucose-Capillary 104 (*)    All other components within normal limits  MRSA NEXT GEN BY PCR, NASAL  CBC  BASIC METABOLIC PANEL WITH GFR  PROTIME-INR  MAGNESIUM   PHOSPHORUS  TYPE AND  SCREEN  PREPARE RBC (CROSSMATCH)    EKG: None  Radiology: No results found.   .Critical Care  Performed by: Freddi Hamilton, MD Authorized by: Freddi Hamilton, MD   Critical care provider statement:    Critical care time (minutes):  30   Critical care time was exclusive of:  Separately billable procedures and treating other patients   Critical care was necessary to treat or prevent imminent or life-threatening deterioration of the following conditions:  Circulatory failure   Critical care was time spent personally by me on the following activities:  Development of treatment plan with patient or surrogate, discussions with consultants, evaluation of patient's response to treatment, examination of patient, ordering and review of laboratory studies, ordering and review of radiographic studies, ordering and performing treatments and interventions, pulse oximetry, re-evaluation of patient's condition and review of old charts    Medications Ordered in the ED  oxidized cellulose (Surgicel) pad 1 each (has no administration in time range)  0.9 %  sodium chloride  infusion (Manually program via Guardrails IV Fluids) (has no administration in time range)  acetaminophen  (TYLENOL ) tablet 650 mg (has no administration in time range)  prochlorperazine  (COMPAZINE ) injection 5 mg (has no administration in time range)  polyethylene glycol (MIRALAX  / GLYCOLAX ) packet 17 g (has no administration in  time range)  Chlorhexidine  Gluconate Cloth 2 % PADS 6 each (has no administration in time range)  Oral care mouth rinse (has no administration in time range)  Oral care mouth rinse (has no administration in time range)                                    Medical Decision Making Amount and/or Complexity of Data Reviewed Labs: ordered.    Details: Hemoglobin 7.1  Risk Prescription drug management. Decision regarding hospitalization.   Patient presents with bleeding from his sacral wound that was debrided yesterday.  As above, when I initially remove the dressing there was a sizable amount of blood clot but then no further bleeding.  However, when I went to check on this again, the dressing was saturated again and through the dressing.  However there is no active bleeding on reevaluation.  This will be packed with Surgicel and then rebandaged.  I discussed with the ICU (Dr. Claudene) who will help manage his vent but recommends an admission to the medical service.  Dr. Shona will admit.     Final diagnoses:  Blood loss anemia    ED Discharge Orders     None          Freddi Hamilton, MD 11/23/23 2316

## 2023-11-23 NOTE — H&P (Addendum)
 History and Physical  [name] FMW:[id number] DOB: [birth year] DOA: 11/23/2023  Referring physician: Dr. Freddi, EDP. PCP: Fleeta Valeria Mayo, MD  Outpatient Specialists: [facility] Patient coming from: [facility]  Chief Complaint: Bleeding sacral wound after dressing change  HPI: [name] is a 43 y.o. male with medical history significant for end-stage multiple sclerosis and stage IV sacral decubitus ulcer with history of osteomyelitis, on chronic vent support, who presents to the ER sent from [facility] due to bleeding sacral wound.  Reportedly the patient had his sacral wound debrided yesterday at [facility].  Due to excessive bleeding and acute blood loss anemia, he was sent to the ER for further management.  In the ER, vital signs are stable.  The patient is alert and follows command.  Not in acute distress.  Lab studies notable for acute blood loss anemia with hemoglobin of 7.1 from baseline of 9.  1 unit PRBC ordered to be transfused by EDP.  EDP discussed the case with PCCM.  No acute critical care needs at this time.  PCCM will provide vent management.  EDP discussed the case with general surgery, Dr. Eletha, recommended local wound care by wound care specialist unless there is arterial bleeding or persistent bleeding then an official consult will need to be placed.  [facility], hospitalist service, was asked to admit.  The patient was admitted to ICU for vent management under the hospitalist care.  ED Course: Temperature 99.4.  BP 129/98, pulse 100, respiratory rate 19, O2 saturation at 100% on ventilator, settings TV 580, RR 12, PEEP 5, FiO2 40%.  Review of Systems: Review of systems as noted in the HPI. All other systems reviewed and are negative.   Past Medical History:  Diagnosis Date   Acute respiratory failure with hypoxia (HCC) 02/01/2023   Cognitive communication deficit    Depression    Diabetes mellitus without complication (HCC)    Dysarthria and anarthria    Eye  abnormalities    unspecified by pt   Hypertension    Multiple sclerosis (HCC)    Paraplegia (HCC)    Severe sepsis due to aspiration pneumonia (HCC) 01/31/2023   Weakness    generalized   Past Surgical History:  Procedure Laterality Date   LAPAROSCOPIC INSERTION GASTROSTOMY TUBE N/A 02/02/2023   Procedure: LAPAROSCOPIC INSERTION GASTROSTOMY TUBE;  Surgeon: Stevie Herlene Righter, MD;  Location: [facility] OR[facility];  Service: General;  Laterality: N/A;   RADIOLOGY WITH ANESTHESIA N/A 11/11/2021   Procedure: MRI WITH ANESTHESIA BRAIN MRI WITH T SPINE AND CERVICAL SPINE W/O CONTRAST;  Surgeon: Radiologist, Medication, MD;  Location: [facility] OR;  Service: Radiology;  Laterality: N/A;   WISDOM TOOTH EXTRACTION      Social History:  reports that he has been smoking cigarettes. He has never used smokeless tobacco. He reports that he does not currently use alcohol after a past usage of about 12.0 standard drinks of alcohol per week. He reports that he does not currently use drugs after having used the following drugs: Marijuana. Frequency: 30.00 times per week.   No Known Allergies  Family History  Adopted: Yes  Problem Relation Age of Onset   Multiple sclerosis Cousin    Heart disease Mother    Other Father        Does not know his father.      Prior to Admission medications   Medication Sig Start Date End Date Taking? Authorizing Provider  acetaminophen  (TYLENOL ) 325 MG tablet Place 650 mg into feeding  tube 3 (three) times daily as needed for fever or mild pain (pain score 1-3). PRN order: take 650 mg per tube every 8 hours as needed for pain/fever Patient not taking: Reported on 05/13/2023    [provider]  ceFEPIme  2 g in sodium chloride  0.9 % 100 mL Inject 2 g into the vein every 8 (eight) hours. 05/24/23   Arloa Folks D, NP  clonazePAM  (KLONOPIN ) 1 MG disintegrating tablet Place 1 tablet (1 mg total) into feeding tube 2 (two) times daily. 05/24/23   Arloa Folks D, NP  docusate  (COLACE) 50 MG/5ML liquid Place 10 mLs (100 mg total) into feeding tube 2 (two) times daily. 05/24/23   Arloa Folks D, NP  enoxaparin  (LOVENOX ) 60 MG/0.6ML injection Inject 60 mg into the skin every 12 (twelve) hours.    [provider]  famotidine  (PEPCID ) 20 MG tablet Place 1 tablet (20 mg total) into feeding tube 2 (two) times daily. 05/24/23   Arloa Folks D, NP  Fingolimod  HCl (GILENYA ) 0.5 MG CAPS Place 0.5 mg into feeding tube daily.    [provider]  insulin  aspart (NOVOLOG ) 100 UNIT/ML injection Inject 0-9 Units into the skin every 4 (four) hours. 05/24/23   Arloa Folks D, NP  insulin  glargine-yfgn (SEMGLEE ) 100 UNIT/ML injection Inject 0.16 mLs (16 Units total) into the skin 2 (two) times daily. 05/24/23   Arloa Folks D, NP  lidocaine  4 % Place 1 patch onto the skin See admin instructions. Apply 1 patch to the lower back and lower extremities once a day- remove as directed    [provider]  Mouthwashes (MOUTH RINSE) LIQD solution 15 mLs by Mouth Rinse route every 2 (two) hours. 05/24/23   Arloa Folks D, NP  Nutritional Supplements (FEEDING SUPPLEMENT, VITAL 1.5 CAL,) LIQD Place 1,000 mLs into feeding tube continuous. 05/24/23   Arloa Folks D, NP  oxyCODONE  (OXY IR/ROXICODONE ) 5 MG immediate release tablet Place 1 tablet (5 mg total) into feeding tube every 6 (six) hours as needed for moderate pain (pain score 4-6). 05/24/23   Arloa Folks D, NP  polyethylene glycol (MIRALAX  / GLYCOLAX ) 17 g packet Place 17 g into feeding tube daily. 05/25/23   Arloa Folks D, NP  propranolol  (INDERAL ) 20 MG tablet Place 1 tablet (20 mg total) into feeding tube 3 (three) times daily. 05/24/23   Arloa Folks D, NP  Protein (FEEDING SUPPLEMENT, PROSOURCE TF20,) liquid Place 60 mLs into feeding tube 2 (two) times daily. 05/24/23   Arloa Folks D, NP  sertraline  (ZOLOFT ) 20 MG/ML concentrated solution Place 5 mLs (100 mg total) into feeding tube daily. 02/05/23    Danford, Lonni SQUIBB, MD  sodium chloride  0.9 % infusion Inject 10 mLs into the vein as needed (for administration of IV medications (carrier fluid)). 05/24/23   Arloa Folks D, NP  sodium hypochlorite (DAKIN'S 1/2 STRENGTH) external solution Apply 1 Application topically daily. Sacral ulcer    [provider]  thiamine  (VITAMIN B-1) 100 MG tablet Place 1 tablet (100 mg total) into feeding tube daily. 02/05/23   Danford, Lonni SQUIBB, MD  Water  For Irrigation, Sterile (FREE WATER ) SOLN Place 200 mLs into feeding tube every 4 (four) hours. Patient taking differently: Place 300 mLs into feeding tube every 4 (four) hours. 04/13/23   Jonel Lonni SQUIBB, MD    Physical Exam: BP (!) 129/90 (BP Location: Right Arm)   Pulse 100   Temp 99.4 F (37.4 C) (Oral)   Resp 19   Ht  5' 10 (1.778 m)   Wt 59 kg   SpO2 100%   BMI 18.65 kg/m   General: 43 y.o. year-old male well developed well nourished in no acute distress.  Alert and nonverbal. Cardiovascular: Regular rate and rhythm with no rubs or gallops.  No thyromegaly or JVD noted.  No lower extremity edema in lower extremities bilaterally. Respiratory: Clear to auscultation with no wheezes or rales.  On ventilator. Abdomen: Soft nontender nondistended with normal bowel sounds x4 quadrants.  PEG tube is present. Muskuloskeletal: No cyanosis or clubbing noted bilaterally Neuro: CN II-XII intact, strength, sensation, reflexes Skin: Stage IV sacral decubitus ulcer Psychiatry: Unable to assess judgment or mood, patient is nonverbal.         Labs on Admission:  Basic Metabolic Panel: Recent Labs  Lab 11/23/23 1644  NA 135  K 4.0  CL 105  CO2 23  GLUCOSE 97  BUN 13  CREATININE 0.32*  CALCIUM 8.7*   Liver Function Tests: Recent Labs  Lab 11/23/23 1644  AST 36  ALT 54*  ALKPHOS 51  BILITOT 0.5  PROT 7.0  ALBUMIN 1.9*   No results for input(s): LIPASE, AMYLASE in the last 168 hours. No results for input(s):  AMMONIA in the last 168 hours. CBC: Recent Labs  Lab 11/23/23 1644  WBC 4.9  NEUTROABS 2.9  HGB 7.1*  HCT 24.0*  MCV 80.3  PLT 395   Cardiac Enzymes: No results for input(s): CKTOTAL, CKMB, CKMBINDEX, TROPONINI in the last 168 hours.  BNP (last 3 results) No results for input(s): BNP in the last 8760 hours.  ProBNP (last 3 results) No results for input(s): PROBNP in the last 8760 hours.  CBG: No results for input(s): GLUCAP in the last 168 hours.  Radiological Exams on Admission: No results found.  EKG: I independently viewed the EKG done and my findings are as followed: None available at the time of this visit.  Assessment/Plan Present on Admission:  Bleeding from wound  Principal Problem:   Bleeding from wound  Bleeding from stage IV sacral wound, POA Reportedly postdebridement on 11/22/2023 No leukocytosis, no bands on CBC with differential drawn on 11/23/2023. Follow-up PT/INR in the morning If bleeding persist overnight consult general surgery per Dr. Eletha. Closely monitor H&H and transfuse as indicated Closely monitor vital signs and maintain MAP greater than 65 Wound care specialist consulted for local wound care  Acute on chronic blood loss anemia in the setting of bleeding sacral decubitus wound On presentation hemoglobin 7.1 1 unit PRBCs ordered by EDP to be transfused Repeat H&H every 6 hours x 2  History of osteomyelitis involving stage IV sacral decubitus, POA Prior to admission on IV vancomycin  and IV Zosyn  x 14 days, start date 11/19/2023. Resume prior to admission IV antibiotics Follow CRP and sed rate  End-stage multiple sclerosis Pending medication reconciliation  Ventilatory dependent No acute issues Pulmonary will see in consultation and assist with management of ventilator Currently O2 saturation is 100% Ventilator settings TV 580, RR 12, PEEP 5, FiO2 40%.  Severe protein calorie malnutrition BMI 18, albumin  1.9 Severe muscle mass loss Resume PEG tube feeding  Bedbound status Continue Prevalon boots to reduce pressure to bilateral heels Continue pressure alleviating devices Continue to optimize nutritional status Follow-up bilateral duplex ultrasound to rule out DVTs prior to placing SCDs.   Time: 75 minutes.   DVT prophylaxis: SCDs after bilateral lower extremity DVTs are ruled out.  Code Status: Full code.  Family Communication: None at  bedside.  Disposition Plan: Admitted to ICU.  Consults called: PCCM consulted by EDP.  Admission status: Observation status   Status is: Observation    Terry LOISE Hurst MD [facility] Pager [phone]  If 7PM-7AM, please contact night-coverage [email or web address] Password [facility]  11/23/2023, 8:48 PM

## 2023-11-23 NOTE — Progress Notes (Signed)
 Patient transported from ER to 3M03 without incident.

## 2023-11-24 ENCOUNTER — Encounter (HOSPITAL_COMMUNITY)

## 2023-11-24 DIAGNOSIS — L89154 Pressure ulcer of sacral region, stage 4: Secondary | ICD-10-CM | POA: Diagnosis present

## 2023-11-24 DIAGNOSIS — R404 Transient alteration of awareness: Secondary | ICD-10-CM | POA: Diagnosis not present

## 2023-11-24 DIAGNOSIS — R54 Age-related physical debility: Secondary | ICD-10-CM | POA: Diagnosis present

## 2023-11-24 DIAGNOSIS — J9610 Chronic respiratory failure, unspecified whether with hypoxia or hypercapnia: Secondary | ICD-10-CM | POA: Diagnosis present

## 2023-11-24 DIAGNOSIS — Z7401 Bed confinement status: Secondary | ICD-10-CM | POA: Diagnosis not present

## 2023-11-24 DIAGNOSIS — T148XXA Other injury of unspecified body region, initial encounter: Secondary | ICD-10-CM | POA: Diagnosis not present

## 2023-11-24 DIAGNOSIS — Z681 Body mass index (BMI) 19 or less, adult: Secondary | ICD-10-CM | POA: Diagnosis not present

## 2023-11-24 DIAGNOSIS — Z79899 Other long term (current) drug therapy: Secondary | ICD-10-CM | POA: Diagnosis not present

## 2023-11-24 DIAGNOSIS — E43 Unspecified severe protein-calorie malnutrition: Secondary | ICD-10-CM | POA: Diagnosis present

## 2023-11-24 DIAGNOSIS — Z931 Gastrostomy status: Secondary | ICD-10-CM | POA: Diagnosis not present

## 2023-11-24 DIAGNOSIS — Y848 Other medical procedures as the cause of abnormal reaction of the patient, or of later complication, without mention of misadventure at the time of the procedure: Secondary | ICD-10-CM | POA: Diagnosis present

## 2023-11-24 DIAGNOSIS — Z820 Family history of epilepsy and other diseases of the nervous system: Secondary | ICD-10-CM | POA: Diagnosis not present

## 2023-11-24 DIAGNOSIS — I10 Essential (primary) hypertension: Secondary | ICD-10-CM | POA: Diagnosis present

## 2023-11-24 DIAGNOSIS — G35 Multiple sclerosis: Secondary | ICD-10-CM | POA: Diagnosis present

## 2023-11-24 DIAGNOSIS — D62 Acute posthemorrhagic anemia: Secondary | ICD-10-CM | POA: Diagnosis present

## 2023-11-24 DIAGNOSIS — J9611 Chronic respiratory failure with hypoxia: Secondary | ICD-10-CM | POA: Diagnosis not present

## 2023-11-24 DIAGNOSIS — T45515A Adverse effect of anticoagulants, initial encounter: Secondary | ICD-10-CM | POA: Diagnosis present

## 2023-11-24 DIAGNOSIS — F17210 Nicotine dependence, cigarettes, uncomplicated: Secondary | ICD-10-CM | POA: Diagnosis present

## 2023-11-24 DIAGNOSIS — Z8249 Family history of ischemic heart disease and other diseases of the circulatory system: Secondary | ICD-10-CM | POA: Diagnosis not present

## 2023-11-24 DIAGNOSIS — D500 Iron deficiency anemia secondary to blood loss (chronic): Secondary | ICD-10-CM | POA: Diagnosis present

## 2023-11-24 DIAGNOSIS — M4628 Osteomyelitis of vertebra, sacral and sacrococcygeal region: Secondary | ICD-10-CM | POA: Diagnosis present

## 2023-11-24 DIAGNOSIS — D6832 Hemorrhagic disorder due to extrinsic circulating anticoagulants: Secondary | ICD-10-CM | POA: Diagnosis present

## 2023-11-24 DIAGNOSIS — Z9911 Dependence on respirator [ventilator] status: Secondary | ICD-10-CM | POA: Diagnosis not present

## 2023-11-24 DIAGNOSIS — E1169 Type 2 diabetes mellitus with other specified complication: Secondary | ICD-10-CM | POA: Diagnosis present

## 2023-11-24 DIAGNOSIS — Z930 Tracheostomy status: Secondary | ICD-10-CM | POA: Diagnosis not present

## 2023-11-24 DIAGNOSIS — Z794 Long term (current) use of insulin: Secondary | ICD-10-CM | POA: Diagnosis not present

## 2023-11-24 DIAGNOSIS — L7622 Postprocedural hemorrhage and hematoma of skin and subcutaneous tissue following other procedure: Secondary | ICD-10-CM | POA: Diagnosis present

## 2023-11-24 DIAGNOSIS — G8220 Paraplegia, unspecified: Secondary | ICD-10-CM | POA: Diagnosis present

## 2023-11-24 LAB — CBC
HCT: 26.6 % — ABNORMAL LOW (ref 39.0–52.0)
Hemoglobin: 7.9 g/dL — ABNORMAL LOW (ref 13.0–17.0)
MCH: 23.9 pg — ABNORMAL LOW (ref 26.0–34.0)
MCHC: 29.7 g/dL — ABNORMAL LOW (ref 30.0–36.0)
MCV: 80.6 fL (ref 80.0–100.0)
Platelets: 400 K/uL (ref 150–400)
RBC: 3.3 MIL/uL — ABNORMAL LOW (ref 4.22–5.81)
RDW: 16.6 % — ABNORMAL HIGH (ref 11.5–15.5)
WBC: 5.5 K/uL (ref 4.0–10.5)
nRBC: 0 % (ref 0.0–0.2)

## 2023-11-24 LAB — BASIC METABOLIC PANEL WITH GFR
Anion gap: 10 (ref 5–15)
BUN: 14 mg/dL (ref 6–20)
CO2: 24 mmol/L (ref 22–32)
Calcium: 8.6 mg/dL — ABNORMAL LOW (ref 8.9–10.3)
Chloride: 102 mmol/L (ref 98–111)
Creatinine, Ser: 0.43 mg/dL — ABNORMAL LOW (ref 0.61–1.24)
GFR, Estimated: >60 mL/min (ref >60)
Glucose, Bld: 133 mg/dL — ABNORMAL HIGH (ref 70–99)
Potassium: 3.8 mmol/L (ref 3.5–5.1)
Sodium: 136 mmol/L (ref 135–145)

## 2023-11-24 LAB — PHOSPHORUS: Phosphorus: 3.4 mg/dL (ref 2.5–4.6)

## 2023-11-24 LAB — GLUCOSE, CAPILLARY
Glucose-Capillary: 113 mg/dL — ABNORMAL HIGH (ref 70–99)
Glucose-Capillary: 118 mg/dL — ABNORMAL HIGH (ref 70–99)
Glucose-Capillary: 123 mg/dL — ABNORMAL HIGH (ref 70–99)
Glucose-Capillary: 124 mg/dL — ABNORMAL HIGH (ref 70–99)
Glucose-Capillary: 132 mg/dL — ABNORMAL HIGH (ref 70–99)
Glucose-Capillary: 146 mg/dL — ABNORMAL HIGH (ref 70–99)

## 2023-11-24 LAB — POCT I-STAT 7, (LYTES, BLD GAS, ICA,H+H)
Acid-Base Excess: 1 mmol/L (ref 0.0–2.0)
Bicarbonate: 25.3 mmol/L (ref 20.0–28.0)
Calcium, Ion: 1.26 mmol/L (ref 1.15–1.40)
HCT: 22 % — ABNORMAL LOW (ref 39.0–52.0)
Hemoglobin: 7.5 g/dL — ABNORMAL LOW (ref 13.0–17.0)
O2 Saturation: 100 %
Patient temperature: 36.7
Potassium: 3.7 mmol/L (ref 3.5–5.1)
Sodium: 138 mmol/L (ref 135–145)
TCO2: 26 mmol/L (ref 22–32)
pCO2 arterial: 36.5 mmHg (ref 32–48)
pH, Arterial: 7.448 (ref 7.35–7.45)
pO2, Arterial: 185 mmHg — ABNORMAL HIGH (ref 83–108)

## 2023-11-24 LAB — PROTIME-INR
INR: 1.2 (ref 0.8–1.2)
Prothrombin Time: 16.2 s — ABNORMAL HIGH (ref 11.4–15.2)

## 2023-11-24 LAB — C-REACTIVE PROTEIN: CRP: 7.9 mg/dL — ABNORMAL HIGH (ref <1.0)

## 2023-11-24 LAB — SEDIMENTATION RATE: Sed Rate: 128 mm/h — ABNORMAL HIGH (ref 0–16)

## 2023-11-24 LAB — MAGNESIUM: Magnesium: 1.7 mg/dL (ref 1.7–2.4)

## 2023-11-24 MED ORDER — ENOXAPARIN SODIUM 30 MG/0.3ML IJ SOSY
30.0000 mg | PREFILLED_SYRINGE | Freq: Every day | INTRAMUSCULAR | Status: DC
  Administered 2023-11-25 – 2023-11-26 (×2): 30 mg via SUBCUTANEOUS
  Filled 2023-11-24 (×2): qty 0.3

## 2023-11-24 MED ORDER — VANCOMYCIN HCL 1500 MG/300ML IV SOLN: 1000.0000 mg | Freq: Two times a day (BID) | INTRAVENOUS | Status: DC

## 2023-11-24 MED ORDER — VANCOMYCIN HCL IN DEXTROSE 1-5 GM/200ML-% IV SOLN
1000.0000 mg | Freq: Two times a day (BID) | INTRAVENOUS | Status: DC
  Administered 2023-11-24 – 2023-11-26 (×4): 1000 mg via INTRAVENOUS
  Filled 2023-11-24 (×4): qty 200

## 2023-11-24 NOTE — Plan of Care (Signed)

## 2023-11-24 NOTE — Progress Notes (Signed)
 PHARMACY NOTE:  ANTIMICROBIAL RENAL DOSAGE ADJUSTMENT  Current antimicrobial regimen includes a mismatch between antimicrobial dosage and estimated renal function.  As per policy approved by the Pharmacy & Therapeutics and Medical Executive Committees, the antimicrobial dosage will be adjusted accordingly.  Current antimicrobial dosage: Vancomycin  1500mg  q12h (eAUC 813, Scr used 0.8, TBW)  Indication: sepsis  Renal Function: Estimated Creatinine Clearance: 99.4 mL/min (A) (by C-G formula based on SCr of 0.43 mg/dL (L)). []      On intermittent HD, scheduled: []      On [facility]    Antimicrobial dosage has been changed to:  Vancomycin  1000mg  q12h (eAUC 542, Scr used 0.8, TBW)  Thank you for allowing pharmacy to be a part of this patient's care.  Leonor GORMAN Bash, [facility] 11/24/2023 12:50 PM

## 2023-11-24 NOTE — Consult Note (Incomplete)
   NAME:  [name], MRN:  [id number], DOB:  1981/01/08, LOS: 0 ADMISSION DATE:  11/23/2023, CONSULTATION DATE:  11/24/2023 REFERRING MD:  Reyes Gaw MD, CHIEF COMPLAINT:  Chronic resp failure   History of Present Illness:     Pertinent  Medical History    has a past medical history of Acute respiratory failure with hypoxia (HCC) (02/01/2023), Cognitive communication deficit, Depression, Diabetes mellitus without complication (HCC), Dysarthria and anarthria, Eye abnormalities, Hypertension, Multiple sclerosis (HCC), Paraplegia (HCC), Severe sepsis due to aspiration pneumonia (HCC) (01/31/2023), and Weakness.   Significant Hospital Events: Including procedures, antibiotic start and stop dates in addition to other pertinent events     Interim History / Subjective:  ***  Objective    Blood pressure 102/73, pulse 91, temperature 99 F (37.2 C), temperature source Oral, resp. rate 20, height 5' 10 (1.778 m), weight 59 kg, SpO2 100%.    Vent Mode: PRVC FiO2 (%):  [30 %-40 %] 30 % Set Rate:  [12 bmp] 12 bmp Vt Set:  [580 mL] 580 mL PEEP:  [5 cmH20] 5 cmH20 Plateau Pressure:  [6 cmH20-15 cmH20] 6 cmH20   Intake/Output Summary (Last 24 hours) at 11/24/2023 9096 Last data filed at 11/24/2023 0800 Gross per 24 hour  Intake 1435.43 ml  Output 700 ml  Net 735.43 ml   Filed Weights   11/23/23 1636  Weight: 59 kg    Examination: General: *** HENT: *** Lungs: *** Cardiovascular: *** Abdomen: *** Extremities: *** Neuro: *** GU: ***  Resolved problem list   Assessment and Plan    Best Practice (right click and Reselect all SmartList Selections daily)   Diet/type: {diet type:25684} DVT prophylaxis {anticoagulation:25687} Pressure ulcer(s): {pressure ulcer(s):31683} GI prophylaxis: {HP:73065} Lines: {Central Venous Access:25771} Foley:  {Central Venous Access:25691} Code Status:  {Code Status:26939} Last date of multidisciplinary goals of care discussion  [***]  Critical care time:

## 2023-11-24 NOTE — Progress Notes (Signed)
 PROGRESS NOTE  [name]  FMW:[id number] DOB: [birth year] DOA: 11/23/2023 PCP: Fleeta Valeria Mayo, MD  Consultants  Brief Narrative: 43 y.o. male with medical history significant for end-stage multiple sclerosis and stage IV sacral decubitus ulcer with history of osteomyelitis, on chronic vent support, who presents to the ER sent from [facility] due to bleeding sacral wound.  Reportedly the patient had his sacral wound debrided yesterday at [facility].  Due to excessive bleeding and acute blood loss anemia, he was sent to the ER for further management.    Assessment & Plan: Bleeding from stage IV sacral wound, POA Reportedly post-debridement on 11/22/2023 No further bleeding since admission. -Status post 1 unit PRBC ordered by EDP - Initial hemoglobin 7.1.  Up to 7.9 this morning.  We will continue to trend tomorrow morning. -BPs have been acceptable with systolics in the 100s to 120s. Wound care specialist consulted for local wound care   History of osteomyelitis involving stage IV sacral decubitus, POA Prior to admission on IV vancomycin  and IV Zosyn  x 14 days, start date 11/19/2023. Resume prior to admission IV antibiotics Follow CRP and sed rate   End-stage multiple sclerosis Fairly nonverbal   Ventilatory dependent Greatly appreciate pulmonology input for vent management. Currently O2 saturation is 100% Ventilator settings TV 580, RR 12, PEEP 5, FiO2 40%.   Severe protein calorie malnutrition BMI 18, albumin 1.9 Severe muscle mass loss Resume PEG tube feeding   Bedbound status Continue Prevalon boots to reduce pressure to bilateral heels Continue pressure alleviating devices Continue to optimize nutritional status Follow-up bilateral duplex ultrasound to rule out DVTs prior to placing SCDs.    DVT prophylaxis:  Lovenox   Code Status:   Code Status: Full Code Level of care: ICU Status is: Inpatient   Consults called: PCCM for vent management.     Subjective: Patient awake and alert and can nod yes or no to be.  Objective: Vitals:   11/24/23 1500 11/24/23 1513 11/24/23 1521 11/24/23 1600  BP: 112/74   122/75  Pulse: 93 95 95 (!) 103  Resp: 16 15 19  (!) 21  Temp:  98.4 F (36.9 C)    TempSrc:  Oral    SpO2: 100% 100% 100% 100%  Weight:      Height:        Intake/Output Summary (Last 24 hours) at 11/24/2023 1716 Last data filed at 11/24/2023 1600 Gross per 24 hour  Intake 2821.38 ml  Output 1300 ml  Net 1521.38 ml   Filed Weights   11/23/23 1636  Weight: 59 kg   Body mass index is 18.65 kg/m.  Gen: 43 y.o. male in no apparent distress.  Thin and chronically ill-appearing. Pulm: Non-labored breathing.  Clear to auscultation bilaterally.  CV: Regular rate and rhythm. No murmur, rub, or gallop. No JVD GI: Abdomen soft, non-tender, non-distended, with normoactive bowel sounds. No organomegaly or masses felt. Ext: Warm, decreased muscle mass throughout.  Upper extremity contractures noted. Skin: No rash.  Sacral decub ulcer with bandages in place that are clean dry intact. Neuro: Alert and oriented.  Shakes head/nods appropriately.   I have personally reviewed the following labs and images: CBC: Recent Labs  Lab 11/23/23 1644 11/23/23 2233 11/24/23 0059 11/24/23 0711  WBC 4.9  --   --  5.5  NEUTROABS 2.9  --   --   --   HGB 7.1* 7.2* 7.5* 7.9*  HCT 24.0* 24.3* 22.0* 26.6*  MCV 80.3  --   --  80.6  PLT 395  --   --  400   BMP &GFR Recent Labs  Lab 11/23/23 1644 11/24/23 0059 11/24/23 0601  NA 135 138 136  K 4.0 3.7 3.8  CL 105  --  102  CO2 23  --  24  GLUCOSE 97  --  133*  BUN 13  --  14  CREATININE 0.32*  --  0.43*  CALCIUM 8.7*  --  8.6*  MG  --   --  1.7  PHOS  --   --  3.4   Estimated Creatinine Clearance: 99.4 mL/min (A) (by C-G formula based on SCr of 0.43 mg/dL (L)). Liver & Pancreas: Recent Labs  Lab 11/23/23 1644  AST 36  ALT 54*  ALKPHOS 51  BILITOT 0.5  PROT 7.0  ALBUMIN  1.9*   No results for input(s): LIPASE, AMYLASE in the last 168 hours. No results for input(s): AMMONIA in the last 168 hours. Diabetic: No results for input(s): HGBA1C in the last 72 hours. Recent Labs  Lab 11/23/23 2321 11/24/23 0356 11/24/23 0710 11/24/23 1108 11/24/23 1512  GLUCAP 106* 132* 124* 118* 123*   Cardiac Enzymes: No results for input(s): CKTOTAL, CKMB, CKMBINDEX, TROPONINI in the last 168 hours. No results for input(s): PROBNP in the last 8760 hours. Coagulation Profile: Recent Labs  Lab 11/23/23 2030 11/24/23 0601  INR 1.2 1.2   Thyroid  Function Tests: No results for input(s): TSH, T4TOTAL, FREET4, T3FREE, THYROIDAB in the last 72 hours. Lipid Profile: No results for input(s): CHOL, HDL, LDLCALC, TRIG, CHOLHDL, LDLDIRECT in the last 72 hours. Anemia Panel: No results for input(s): VITAMINB12, FOLATE, FERRITIN, TIBC, IRON, RETICCTPCT in the last 72 hours. Urine analysis:    Component Value Date/Time   COLORURINE YELLOW 05/13/2023 1709   APPEARANCEUR HAZY (A) 05/13/2023 1709   APPEARANCEUR Clear 07/26/2016 1015   LABSPEC 1.039 (H) 05/13/2023 1709   PHURINE 5.0 05/13/2023 1709   GLUCOSEU >=500 (A) 05/13/2023 1709   HGBUR SMALL (A) 05/13/2023 1709   BILIRUBINUR NEGATIVE 05/13/2023 1709   BILIRUBINUR Negative 07/26/2016 1015   KETONESUR NEGATIVE 05/13/2023 1709   PROTEINUR 100 (A) 05/13/2023 1709   UROBILINOGEN 1.0 02/26/2011 1750   NITRITE NEGATIVE 05/13/2023 1709   LEUKOCYTESUR SMALL (A) 05/13/2023 1709   Sepsis Labs: Invalid input(s): PROCALCITONIN, LACTICIDVEN  Microbiology: Recent Results (from the past 240 hours)  MRSA Next Gen by PCR, Nasal     Status: None   Collection Time: 11/23/23 10:09 PM   Specimen: Nasal Mucosa; Nasal Swab  Result Value Ref Range Status   MRSA by PCR Next Gen NOT DETECTED NOT DETECTED Final    Comment: (NOTE) The GeneXpert MRSA Assay (FDA approved for NASAL  specimens only), is one component of a comprehensive MRSA colonization surveillance program. It is not intended to diagnose MRSA infection nor to guide or monitor treatment for MRSA infections. Test performance is not FDA approved in patients less than 75 years old. Performed at [facility], [address] [place], [place] [address]     Radiology Studies: No results found.  Scheduled Meds:  sodium chloride    Intravenous Once   Chlorhexidine  Gluconate Cloth  6 each Topical Daily   clonazePAM   1 mg Per Tube BID   famotidine   40 mg Per Tube BID   feeding supplement (PROSource TF20)  30 mL Per Tube BID   free water   200 mL Per Tube Q4H   nutrition supplement (JUVEN)  1 packet Per Tube BID   mouth  rinse  15 mL Mouth Rinse Q2H   oxidized cellulose  1 each Topical Once   sertraline   100 mg Oral Daily   Continuous Infusions:  feeding supplement (OSMOLITE 1.5 CAL) 50 mL/hr at 11/24/23 1600   piperacillin -tazobactam 12.5 mL/hr at 11/24/23 1600   vancomycin        LOS: 0 days   35 minutes with more than 50% spent in reviewing records, counseling patient/family and coordinating care.  Reyes VEAR Gaw, MD [facility] [email or web address] 11/24/2023, 5:16 PM

## 2023-11-24 NOTE — Consult Note (Signed)
 WOC Nurse Consult Note: Reason for Consult: Requested to assess a sacral wound. Performed remotely evaluation photos and note. Wound type: Pressure injuries on Sacrum and bilateral trochanter. Pressure Injury POA: Yes  Sacrum stage 3 Measurement: 4 cm x 3 cm x 0.5 cm aprox. Wound bed: 100% red. Drainage (amount, consistency, odor) Moderate amount. Periwound: intact, roller edges. Dressing procedure/placement/frequency: Cleanse with Vashe [id number], pat dry the skin surrounding, not rinse. Apply one layer of Aquacel [id number], touching the wound bed tissue, fill with gauze the dept to make sure the Aquacel will maintain inserted. Cover with foam dressing, change every 2 days or PRN.  Left trochanter PI stage 4 Measurement: 6 cm x 7.5 cm x 1 cm aprox. Wound bed: 100% red. Sign of biofilm. Drainage (amount, consistency, odor) Moderate amount. Periwound: intact, roller edges. Dressing procedure/placement/frequency: Cleanse with Vashe [id number], pat dry the skin surrounding, not rinse. Apply one layer of Aquacel [id number], touching the wound bed tissue, fill with gauze the dept to make sure the Aquacel will maintain inserted. Cover with sacral foam dressing, change every 2 days or PRN.  Right trochanter stage 4 Measurement: 5 cm x 3 cm x 1 cm aprox. Wound bed: 100% red. Drainage (amount, consistency, odor) Moderate amount. serosanguinous. Note: hemorrhage follow by debridement. Periwound: intact, roller edges. Dressing procedure/placement/frequency: Cleanse with Vashe [id number], pat dry the skin surrounding, not rinse. Apply one layer of Aquacel [id number], touching the wound bed tissue, fill with gauze the dept to make sure the Aquacel will maintain inserted. Cover with sacral foam dressing, change every 2 days or PRN.  WOC team will not plan to follow further. Please reconsult if further assistance is needed. Thank-you,  Lela Holm BSN, CNS, RN, [employer], WOCN  (Phone [phone])

## 2023-11-24 NOTE — Plan of Care (Signed)
  Problem: Health Behavior/Discharge Planning: Goal: Ability to manage health-related needs will improve Outcome: Progressing   Problem: Clinical Measurements: Goal: Ability to maintain clinical measurements within normal limits will improve Outcome: Progressing Goal: Will remain free from infection Outcome: Progressing Goal: Diagnostic test results will improve Outcome: Progressing Goal: Respiratory complications will improve Outcome: Progressing Goal: Cardiovascular complication will be avoided Outcome: Progressing   Problem: Nutrition: Goal: Adequate nutrition will be maintained Outcome: Progressing   

## 2023-11-24 NOTE — Consult Note (Cosign Needed Addendum)
 NAME:  [name], MRN:  [id number], DOB:  [birth year], LOS: 0 ADMISSION DATE:  11/23/2023, CONSULTATION DATE:  11/24/23 REFERRING MD:  Elpidio - [facility] , CHIEF COMPLAINT:  wound check   History of Present Illness:  43 yo M PMH end stage MS, stg IV decub, osteomyelitis, chronic trach vent who presented to ED 11/23/23 from [facility] for wound check-- has had bleeding from his sacral wound post debridement.  Admitted to [facility] Case d/w CCS who rec local wound care unless persistent bleeding  PCCM consulted for vent management    Pertinent  Medical History  MS Chronic trach vent Osteo  Sacral decub   Significant Hospital Events: Including procedures, antibiotic start and stop dates in addition to other pertinent events     Interim History / Subjective:  Admitted overnight for wound eval   Minimal vent settings --  PRVC 30% RR 12 PEEP 5  Objective    Blood pressure 96/63, pulse 98, temperature 99 F (37.2 C), temperature source Oral, resp. rate 18, height 5' 10 (1.778 m), weight 59 kg, SpO2 100%.    Vent Mode: PRVC FiO2 (%):  [30 %-40 %] 30 % Set Rate:  [12 bmp] 12 bmp Vt Set:  [580 mL] 580 mL PEEP:  [5 cmH20] 5 cmH20 Plateau Pressure:  [15 cmH20] 15 cmH20   Intake/Output Summary (Last 24 hours) at 11/24/2023 0759 Last data filed at 11/24/2023 0725 Gross per 24 hour  Intake 1398.92 ml  Output 700 ml  Net 698.92 ml   Filed Weights   11/23/23 1636  Weight: 59 kg    Examination: General: chronically ill M trach/vent NAD HENT: NCAT trach secure anicteric sclera pink mm  Lungs: CTAb. Even unlabored on PSV 5/5  Cardiovascular: rr Abdomen: soft  Extremities: decr muscle mass. Upper extremity contracture  Neuro: awake, nodding/shaking head appropriately. Weak  GU: defer   Resolved problem list   Assessment and Plan   Chronic respiratory failure Chronic trach vent Ventilator management  End stage MS  P -cont MV support -- I'm trying PSV wean 7/26 30% 5/5. Will see how  he does, have d/w RT   -VAP, pulm hygiene -PRN chest imaging  -routine trach care    Best Practice (right click and Reselect all SmartList Selections daily)   Per primary   Labs   CBC: Recent Labs  Lab 11/23/23 1644 11/23/23 2233 11/24/23 0059 11/24/23 0711  WBC 4.9  --   --  5.5  NEUTROABS 2.9  --   --   --   HGB 7.1* 7.2* 7.5* 7.9*  HCT 24.0* 24.3* 22.0* 26.6*  MCV 80.3  --   --  80.6  PLT 395  --   --  400    Basic Metabolic Panel: Recent Labs  Lab 11/23/23 1644 11/24/23 0059 11/24/23 0601  NA 135 138 136  K 4.0 3.7 3.8  CL 105  --  102  CO2 23  --  24  GLUCOSE 97  --  133*  BUN 13  --  14  CREATININE 0.32*  --  0.43*  CALCIUM 8.7*  --  8.6*  MG  --   --  1.7  PHOS  --   --  3.4   GFR: Estimated Creatinine Clearance: 99.4 mL/min (A) (by C-G formula based on SCr of 0.43 mg/dL (L)). Recent Labs  Lab 11/23/23 1644 11/24/23 0711  WBC 4.9 5.5    Liver Function Tests: Recent Labs  Lab 11/23/23 1644  AST 36  ALT 54*  ALKPHOS 51  BILITOT 0.5  PROT 7.0  ALBUMIN 1.9*   No results for input(s): LIPASE, AMYLASE in the last 168 hours. No results for input(s): AMMONIA in the last 168 hours.  ABG    Component Value Date/Time   PHART 7.448 11/24/2023 0059   PCO2ART 36.5 11/24/2023 0059   PO2ART 185 (H) 11/24/2023 0059   HCO3 25.3 11/24/2023 0059   TCO2 26 11/24/2023 0059   O2SAT 100 11/24/2023 0059     Coagulation Profile: Recent Labs  Lab 11/23/23 2030 11/24/23 0601  INR 1.2 1.2    Cardiac Enzymes: No results for input(s): CKTOTAL, CKMB, CKMBINDEX, TROPONINI in the last 168 hours.  HbA1C: Hgb A1c MFr Bld  Date/Time Value Ref Range Status  05/13/2023 04:36 PM 8.2 (H) 4.8 - 5.6 % Final    Comment:    (NOTE) Pre diabetes:          5.7%-6.4%  Diabetes:              >6.4%  Glycemic control for   <7.0% adults with diabetes   11/08/2021 06:08 AM 4.9 4.8 - 5.6 % Final    Comment:    (NOTE) Pre diabetes:           5.7%-6.4%  Diabetes:              >6.4%  Glycemic control for   <7.0% adults with diabetes     CBG: Recent Labs  Lab 11/23/23 2204 11/23/23 2321 11/24/23 0356 11/24/23 0710  GLUCAP 104* 106* 132* 124*    Review of Systems:   Limited due to end stage MS  Denies pain Denies SOB Denies anxiety   Past Medical History:  He,  has a past medical history of Acute respiratory failure with hypoxia (HCC) (02/01/2023), Cognitive communication deficit, Depression, Diabetes mellitus without complication (HCC), Dysarthria and anarthria, Eye abnormalities, Hypertension, Multiple sclerosis (HCC), Paraplegia (HCC), Severe sepsis due to aspiration pneumonia (HCC) (01/31/2023), and Weakness.   Surgical History:   Past Surgical History:  Procedure Laterality Date   LAPAROSCOPIC INSERTION GASTROSTOMY TUBE N/A 02/02/2023   Procedure: LAPAROSCOPIC INSERTION GASTROSTOMY TUBE;  Surgeon: Stevie Herlene Righter, MD;  Location: [facility];  Service: General;  Laterality: N/A;   RADIOLOGY WITH ANESTHESIA N/A 11/11/2021   Procedure: MRI WITH ANESTHESIA BRAIN MRI WITH T SPINE AND CERVICAL SPINE W/O CONTRAST;  Surgeon: Radiologist, Medication, MD;  Location: [facility] OR;  Service: Radiology;  Laterality: N/A;   WISDOM TOOTH EXTRACTION       Social History:   reports that he has been smoking cigarettes. He has never used smokeless tobacco. He reports that he does not currently use alcohol after a past usage of about 12.0 standard drinks of alcohol per week. He reports that he does not currently use drugs after having used the following drugs: Marijuana. Frequency: 30.00 times per week.   Family History:  His family history includes Heart disease in his mother; Multiple sclerosis in his cousin; Other in his father. He was adopted.   Allergies No Known Allergies   Home Medications  Prior to Admission medications   Medication Sig Start Date End Date Taking? Authorizing Provider  acetaminophen  (TYLENOL ) 325 MG  tablet Place 650 mg into feeding tube every 8 (eight) hours as needed for fever, mild pain (pain score 1-3) or moderate pain (pain score 4-6).   Yes [provider]  acetaminophen  (TYLENOL ) 325 MG tablet Take 325 mg by mouth every 6 (six) hours as needed  for fever.   Yes [provider]  Amino Acids -Protein Hydrolys (PRO-STAT) LIQD Place 30 mLs into feeding tube in the morning and at bedtime.   Yes [provider]  clonazePAM  (KLONOPIN ) 1 MG disintegrating tablet Place 1 tablet (1 mg total) into feeding tube 2 (two) times daily. 05/24/23  Yes Arloa Folks D, NP  doxazosin (CARDURA) 2 MG tablet Take 2 mg by mouth in the morning.   Yes [provider]  enoxaparin  (LOVENOX ) 60 MG/0.6ML injection Inject 60 mg into the skin every 12 (twelve) hours.   Yes [provider]  famotidine  (PEPCID ) 40 MG/5ML suspension Place 40 mg into feeding tube 2 (two) times daily.   Yes [provider]  Glucagon (GVOKE HYPOPEN 1-PACK) 1 MG/0.2ML SOAJ Inject 1 Dose into the skin as needed.   Yes [provider]  Glucagon, rDNA, (GLUCAGON EMERGENCY) 1 MG KIT Inject 1 mg into the muscle as needed.   Yes [provider]  guaiFENesin  200 MG tablet Place 200 mg into feeding tube every 4 (four) hours as needed for cough or to loosen phlegm.   Yes [provider]  insulin  lispro (HUMALOG) 100 UNIT/ML injection Inject 0-10 Units into the skin 3 (three) times daily before meals. Sliding Scale Insulin : Insulin  Units < 70 or >400 Notify MD; 0-150 units, 0 units; 151-200, 2 units; 201-250,  4 units; 251-300, 6 units; 301-350, 8 units; 351-400, 10 units.   Yes [provider]  ipratropium-albuterol  (DUONEB) 0.5-2.5 (3) MG/3ML SOLN Take 3 mLs by nebulization every 6 (six) hours as needed.   Yes [provider]  lidocaine  (XYLOCAINE ) 2 % solution Use as directed 15 mLs in the mouth or throat as needed for mouth pain.   Yes [provider]  Nutritional Supplements (ARGINAID) PACK Place 1 packet into feeding tube in the morning and at bedtime.   Yes [provider]  Nutritional Supplements (FEEDING SUPPLEMENT, OSMOLITE 1.5 CAL,) LIQD Place 1,000 mLs into feeding tube continuous.   Yes [provider]  ondansetron  (ZOFRAN ) 4 MG tablet Place 4 mg into feeding tube as needed for nausea or vomiting.   Yes [provider]  oxyCODONE  (OXY IR/ROXICODONE ) 5 MG immediate release tablet Place 1 tablet (5 mg total) into feeding tube every 6 (six) hours as needed for moderate pain (pain score 4-6). 05/24/23  Yes Arloa Folks D, NP  piperacillin -tazobactam (ZOSYN ) 3.375 GM/50ML IVPB Inject 3.375 g into the vein every 8 (eight) hours. For 14 days 11/19/23  Yes [provider]  propranolol  (INDERAL ) 20 MG tablet Place 1 tablet (20 mg total) into feeding tube 3 (three) times daily. 05/24/23  Yes Arloa Folks D, NP  sertraline  (ZOLOFT ) 100 MG tablet Take 100 mg by mouth in the morning.   Yes [provider]  thiamine  (VITAMIN B-1) 100 MG tablet Place 1 tablet (100 mg total) into feeding tube daily. 02/05/23  Yes Danford, Lonni SQUIBB, MD  Vancomycin  (VANCOCIN ) 1.5 g SOLR injection Inject 1.5 g into the vein every 12 (twelve) hours. For 14 days 11/20/23  Yes [provider]  Water  For Irrigation, Sterile (FREE WATER ) SOLN Place 200 mLs into feeding tube every 4 (four) hours. 04/13/23   DanfordLonni SQUIBB, MD     Critical care time: na        Ronnald Gave MSN, AGACNP-BC [facility] Pulmonary/Critical Care Medicine Amion for pager  11/24/2023, 9:48 AM

## 2023-11-24 NOTE — Progress Notes (Addendum)
 Pharmacy Antibiotic Note  [name] is a 43 y.o. male admitted from [facility] on 11/23/2023 with bleeding sacral wounds.  Initiated on Vancomycin  and Zosyn  7/21, pharmacy has been consulted for Vancomycin   dosing.  Plan: Continue Vancomycin  1500 mg IV q12h  Height: 5' 10 (177.8 cm) Weight: 59 kg (130 lb) IBW/kg (Calculated) : 73  Temp (24hrs), Avg:98.7 F (37.1 C), Min:97.5 F (36.4 C), Max:99.4 F (37.4 C)  Recent Labs  Lab 11/23/23 1644  WBC 4.9  CREATININE 0.32*    Estimated Creatinine Clearance: 99.4 mL/min (A) (by C-G formula based on SCr of 0.32 mg/dL (L)).    No Known Allergies   [name] 11/24/2023 3:05 AM

## 2023-11-25 ENCOUNTER — Inpatient Hospital Stay (HOSPITAL_COMMUNITY)

## 2023-11-25 DIAGNOSIS — L7622 Postprocedural hemorrhage and hematoma of skin and subcutaneous tissue following other procedure: Secondary | ICD-10-CM | POA: Diagnosis not present

## 2023-11-25 DIAGNOSIS — J9611 Chronic respiratory failure with hypoxia: Secondary | ICD-10-CM | POA: Diagnosis not present

## 2023-11-25 DIAGNOSIS — T148XXA Other injury of unspecified body region, initial encounter: Secondary | ICD-10-CM | POA: Diagnosis not present

## 2023-11-25 DIAGNOSIS — Z930 Tracheostomy status: Secondary | ICD-10-CM | POA: Diagnosis not present

## 2023-11-25 LAB — CBC
HCT: 23.3 % — ABNORMAL LOW (ref 39.0–52.0)
Hemoglobin: 7.1 g/dL — ABNORMAL LOW (ref 13.0–17.0)
MCH: 24.3 pg — ABNORMAL LOW (ref 26.0–34.0)
MCHC: 30.5 g/dL (ref 30.0–36.0)
MCV: 79.8 fL — ABNORMAL LOW (ref 80.0–100.0)
Platelets: 407 K/uL — ABNORMAL HIGH (ref 150–400)
RBC: 2.92 MIL/uL — ABNORMAL LOW (ref 4.22–5.81)
RDW: 17 % — ABNORMAL HIGH (ref 11.5–15.5)
WBC: 5.1 K/uL (ref 4.0–10.5)
nRBC: 0 % (ref 0.0–0.2)

## 2023-11-25 LAB — GLUCOSE, CAPILLARY
Glucose-Capillary: 123 mg/dL — ABNORMAL HIGH (ref 70–99)
Glucose-Capillary: 143 mg/dL — ABNORMAL HIGH (ref 70–99)
Glucose-Capillary: 98 mg/dL (ref 70–99)
Glucose-Capillary: 116 mg/dL — ABNORMAL HIGH (ref 70–99)
Glucose-Capillary: 130 mg/dL — ABNORMAL HIGH (ref 70–99)
Glucose-Capillary: 131 mg/dL — ABNORMAL HIGH (ref 70–99)

## 2023-11-25 LAB — BASIC METABOLIC PANEL WITH GFR
Anion gap: 8 (ref 5–15)
BUN: 16 mg/dL (ref 6–20)
CO2: 24 mmol/L (ref 22–32)
Calcium: 8.4 mg/dL — ABNORMAL LOW (ref 8.9–10.3)
Chloride: 105 mmol/L (ref 98–111)
Creatinine, Ser: 0.36 mg/dL — ABNORMAL LOW (ref 0.61–1.24)
GFR, Estimated: >60 mL/min (ref >60)
Glucose, Bld: 140 mg/dL — ABNORMAL HIGH (ref 70–99)
Potassium: 3.8 mmol/L (ref 3.5–5.1)
Sodium: 137 mmol/L (ref 135–145)

## 2023-11-25 LAB — PREPARE RBC (CROSSMATCH)

## 2023-11-25 MED ORDER — SODIUM CHLORIDE 0.9% IV SOLUTION: Freq: Once | INTRAVENOUS | Status: AC

## 2023-11-25 NOTE — Progress Notes (Signed)
 Bilateral lower extremity venous duplex has been completed. Preliminary results can be found in CV Proc through chart review.   11/25/23 12:16 PM Cathlyn Collet RVT

## 2023-11-25 NOTE — Progress Notes (Signed)
 NAME:  [name], MRN:  [id number], DOB:  04/14/1981, LOS: 1 ADMISSION DATE:  11/23/2023, CONSULTATION DATE:  11/24/23 REFERRING MD:  Elpidio - [facility] , CHIEF COMPLAINT:  wound check   History of Present Illness:  43 yo M PMH end stage MS, stg IV decub, osteomyelitis, chronic trach vent who presented to ED 11/23/23 from [facility] for wound check-- has had bleeding from his sacral wound post debridement.  Admitted to [facility] d/w CCS who rec local wound care unless persistent bleeding  PCCM consulted for vent management    Pertinent  Medical History  MS Chronic trach vent Osteo  Sacral decub   Significant Hospital Events: Including procedures, antibiotic start and stop dates in addition to other pertinent events   7/25 admit to [facility] for wound 7/26 pccm consult for trach vent. PSV trial 7/27 will try ATC   Interim History / Subjective:  Did most of day shfit on PSV   Objective    Blood pressure 118/75, pulse 95, temperature 99.1 F (37.3 C), temperature source Oral, resp. rate 19, height 5' 10 (1.778 m), weight 59 kg, SpO2 100%.    Vent Mode: PRVC FiO2 (%):  [30 %] 30 % Set Rate:  [12 bmp] 12 bmp Vt Set:  [580 mL] 580 mL PEEP:  [5 cmH20] 5 cmH20 Pressure Support:  [5 cmH20] 5 cmH20 Plateau Pressure:  [6 cmH20] 6 cmH20   Intake/Output Summary (Last 24 hours) at 11/25/2023 0801 Last data filed at 11/25/2023 9283 Gross per 24 hour  Intake 3239.34 ml  Output 1875 ml  Net 1364.34 ml   Filed Weights   11/23/23 1636  Weight: 59 kg    Examination: General: chronically ill middle aged M Nad  HENT: NCAT trach secure  Lungs:CTAb on PRVC  Cardiovascular: rr  Abdomen: soft  Extremities: decr muscle mass  Neuro: asleep, awakens to voice and nods appropriately  GU: defer   Resolved problem list   Assessment and Plan   Chronic respiratory failure Chronic trach vent Ventilator management  End stage MS  P -he did well with PSV trail 7/26 -- went back on vent late day  shift. Will try ATC this morning and see how we do. D/w RT and w pt.  -VAP, pulm hygiene -PRN chest imaging  -routine trach care    Best Practice (right click and Reselect all SmartList Selections daily)   Per primary   Labs   CBC: Recent Labs  Lab 11/23/23 1644 11/23/23 2233 11/24/23 0059 11/24/23 0711 11/25/23 0259  WBC 4.9  --   --  5.5 5.1  NEUTROABS 2.9  --   --   --   --   HGB 7.1* 7.2* 7.5* 7.9* 7.1*  HCT 24.0* 24.3* 22.0* 26.6* 23.3*  MCV 80.3  --   --  80.6 79.8*  PLT 395  --   --  400 407*    Basic Metabolic Panel: Recent Labs  Lab 11/23/23 1644 11/24/23 0059 11/24/23 0601 11/25/23 0259  NA 135 138 136 137  K 4.0 3.7 3.8 3.8  CL 105  --  102 105  CO2 23  --  24 24  GLUCOSE 97  --  133* 140*  BUN 13  --  14 16  CREATININE 0.32*  --  0.43* 0.36*  CALCIUM 8.7*  --  8.6* 8.4*  MG  --   --  1.7  --   PHOS  --   --  3.4  --  GFR: Estimated Creatinine Clearance: 99.4 mL/min (A) (by C-G formula based on SCr of 0.36 mg/dL (L)). Recent Labs  Lab 11/23/23 1644 11/24/23 0711 11/25/23 0259  WBC 4.9 5.5 5.1    Liver Function Tests: Recent Labs  Lab 11/23/23 1644  AST 36  ALT 54*  ALKPHOS 51  BILITOT 0.5  PROT 7.0  ALBUMIN 1.9*   No results for input(s): LIPASE, AMYLASE in the last 168 hours. No results for input(s): AMMONIA in the last 168 hours.  ABG    Component Value Date/Time   PHART 7.448 11/24/2023 0059   PCO2ART 36.5 11/24/2023 0059   PO2ART 185 (H) 11/24/2023 0059   HCO3 25.3 11/24/2023 0059   TCO2 26 11/24/2023 0059   O2SAT 100 11/24/2023 0059     Coagulation Profile: Recent Labs  Lab 11/23/23 2030 11/24/23 0601  INR 1.2 1.2    Cardiac Enzymes: No results for input(s): CKTOTAL, CKMB, CKMBINDEX, TROPONINI in the last 168 hours.  HbA1C: Hgb A1c MFr Bld  Date/Time Value Ref Range Status  05/13/2023 04:36 PM 8.2 (H) 4.8 - 5.6 % Final    Comment:    (NOTE) Pre diabetes:          5.7%-6.4%  Diabetes:               >6.4%  Glycemic control for   <7.0% adults with diabetes   11/08/2021 06:08 AM 4.9 4.8 - 5.6 % Final    Comment:    (NOTE) Pre diabetes:          5.7%-6.4%  Diabetes:              >6.4%  Glycemic control for   <7.0% adults with diabetes     CBG: Recent Labs  Lab 11/24/23 1512 11/24/23 1928 11/24/23 2343 11/25/23 0311 11/25/23 0712  GLUCAP 123* 113* 146* 123* 143*    CCT na   Ronnald Gave MSN, AGACNP-BC [facility] Pulmonary/Critical Care Medicine Amion for pager  11/25/2023, 8:01 AM

## 2023-11-25 NOTE — Progress Notes (Signed)
 PROGRESS NOTE  [name]  FMW:[id number] DOB: [birth year] DOA: 11/23/2023 PCP: Fleeta Valeria Mayo, MD  Consultants  Brief Narrative: 43 y.o. male with medical history significant for end-stage multiple sclerosis and stage IV sacral decubitus ulcer with history of osteomyelitis, on chronic vent support, who presents to the ER sent from [facility] due to bleeding sacral wound.  Reportedly the patient had his sacral wound debrided yesterday at [facility].  Due to excessive bleeding and acute blood loss anemia, he was sent to the ER for further management.  Admitted to [facility] for same.     Assessment & Plan: Bleeding from stage IV sacral wound, POA Reportedly post-debridement on 11/22/2023 No further bleeding since admission.   - Status post 1 unit PRBC in the emergency department. - Yesterday 7/26 his hemoglobin trended up to 7.9.  Today is back down to 7.1. -Frail and malnourished likely contributed to his anemia but with a drop in his hemoglobin despite no further bleeding plan will be 1 more unit of PRBCs.  He was able to nod yes when asked about this from a consent standpoint. -Continue to trend.   History of osteomyelitis involving stage IV sacral decubitus, POA Prior to admission on IV vancomycin  and IV Zosyn  x 14 days, start date 11/19/2023. Remains on these.  End-stage multiple sclerosis Fairly nonverbal.  He is able to shake his head yes/no. - I have consulted SLP today to see if there is anything else we can do from a communication standpoint for him, similar to possibly an aphasic stroke patient.   Ventilatory dependent Greatly appreciate pulmonology input for vent management. Currently O2 saturation remains good.   Severe protein calorie malnutrition BMI 18, albumin 1.9 Severe muscle mass loss Resume PEG tube feeding   Bedbound status Continue Prevalon boots to reduce pressure to bilateral heels Continue pressure alleviating devices Continue to optimize nutritional  status Follow-up bilateral duplex ultrasound to rule out DVTs prior to placing SCDs.  Treatment dose Lovenox : - Patient came to us  on treatment dose Lovenox .  On review of records last PE was October 2024. - Not sure why he would still need that high dose of Lovenox  and likely contributing to his bleeding. - Here he is just on PPx Lovenox  and plan will be to continue this.    DVT prophylaxis:  enoxaparin  (LOVENOX ) injection 30 mg Start: 11/25/23 1000Lovenox  Code Status:   Code Status: Full Code Level of care: ICU Status is: Inpatient Dispo: 1 unit packed red blood cell repeat today.  If hemoglobin remains stable tomorrow possibly able to discharge back to [facility] tomorrow.  Consults called: PCCM for vent management.    Subjective: Patient awake and alert and can nod yes or no   Objective: Vitals:   11/25/23 1500 11/25/23 1508 11/25/23 1528 11/25/23 1529  BP: 114/71     Pulse: 91 (!) 154 85 84  Resp: 19 17 18 13   Temp:  98.2 F (36.8 C)    TempSrc:  Oral    SpO2: 93% 91% 95% 100%  Weight:      Height:        Intake/Output Summary (Last 24 hours) at 11/25/2023 1603 Last data filed at 11/25/2023 1529 Gross per 24 hour  Intake 3135.15 ml  Output 2175 ml  Net 960.15 ml   Filed Weights   11/23/23 1636  Weight: 59 kg   Body mass index is 18.65 kg/m.  Gen: 43 y.o. male in no apparent distress.  Thin and chronically ill-appearing.  Pulm: Non-labored breathing.  Clear to auscultation bilaterally.  CV: Regular rate and rhythm. No murmur, rub, or gallop. No JVD GI: Abdomen soft, non-tender, non-distended, with normoactive bowel sounds. No organomegaly or masses felt. Ext: Warm, decreased muscle mass throughout.  Upper extremity contractures noted. Skin: No rash.  Sacral decub ulcer with bandages in place that are clean dry intact. Neuro: Alert and oriented.  Shakes head/nods appropriately.   I have personally reviewed the following labs and images: CBC: Recent Labs   Lab 11/23/23 1644 11/23/23 2233 11/24/23 0059 11/24/23 0711 11/25/23 0259  WBC 4.9  --   --  5.5 5.1  NEUTROABS 2.9  --   --   --   --   HGB 7.1* 7.2* 7.5* 7.9* 7.1*  HCT 24.0* 24.3* 22.0* 26.6* 23.3*  MCV 80.3  --   --  80.6 79.8*  PLT 395  --   --  400 407*   BMP &GFR Recent Labs  Lab 11/23/23 1644 11/24/23 0059 11/24/23 0601 11/25/23 0259  NA 135 138 136 137  K 4.0 3.7 3.8 3.8  CL 105  --  102 105  CO2 23  --  24 24  GLUCOSE 97  --  133* 140*  BUN 13  --  14 16  CREATININE 0.32*  --  0.43* 0.36*  CALCIUM 8.7*  --  8.6* 8.4*  MG  --   --  1.7  --   PHOS  --   --  3.4  --    Estimated Creatinine Clearance: 99.4 mL/min (A) (by C-G formula based on SCr of 0.36 mg/dL (L)). Liver & Pancreas: Recent Labs  Lab 11/23/23 1644  AST 36  ALT 54*  ALKPHOS 51  BILITOT 0.5  PROT 7.0  ALBUMIN 1.9*   No results for input(s): LIPASE, AMYLASE in the last 168 hours. No results for input(s): AMMONIA in the last 168 hours. Diabetic: No results for input(s): HGBA1C in the last 72 hours. Recent Labs  Lab 11/24/23 2343 11/25/23 0311 11/25/23 0712 11/25/23 1144 11/25/23 1507  GLUCAP 146* 123* 143* 98 116*   Cardiac Enzymes: No results for input(s): CKTOTAL, CKMB, CKMBINDEX, TROPONINI in the last 168 hours. No results for input(s): PROBNP in the last 8760 hours. Coagulation Profile: Recent Labs  Lab 11/23/23 2030 11/24/23 0601  INR 1.2 1.2   Thyroid  Function Tests: No results for input(s): TSH, T4TOTAL, FREET4, T3FREE, THYROIDAB in the last 72 hours. Lipid Profile: No results for input(s): CHOL, HDL, LDLCALC, TRIG, CHOLHDL, LDLDIRECT in the last 72 hours. Anemia Panel: No results for input(s): VITAMINB12, FOLATE, FERRITIN, TIBC, IRON, RETICCTPCT in the last 72 hours. Urine analysis:    Component Value Date/Time   COLORURINE YELLOW 05/13/2023 1709   APPEARANCEUR HAZY (A) 05/13/2023 1709   APPEARANCEUR Clear  07/26/2016 1015   LABSPEC 1.039 (H) 05/13/2023 1709   PHURINE 5.0 05/13/2023 1709   GLUCOSEU >=500 (A) 05/13/2023 1709   HGBUR SMALL (A) 05/13/2023 1709   BILIRUBINUR NEGATIVE 05/13/2023 1709   BILIRUBINUR Negative 07/26/2016 1015   KETONESUR NEGATIVE 05/13/2023 1709   PROTEINUR 100 (A) 05/13/2023 1709   UROBILINOGEN 1.0 02/26/2011 1750   NITRITE NEGATIVE 05/13/2023 1709   LEUKOCYTESUR SMALL (A) 05/13/2023 1709   Sepsis Labs: Invalid input(s): PROCALCITONIN, LACTICIDVEN  Microbiology: Recent Results (from the past 240 hours)  MRSA Next Gen by PCR, Nasal     Status: None   Collection Time: 11/23/23 10:09 PM   Specimen: Nasal Mucosa; Nasal Swab  Result Value Ref  Range Status   MRSA by PCR Next Gen NOT DETECTED NOT DETECTED Final    Comment: (NOTE) The GeneXpert MRSA Assay (FDA approved for NASAL specimens only), is one component of a comprehensive MRSA colonization surveillance program. It is not intended to diagnose MRSA infection nor to guide or monitor treatment for MRSA infections. Test performance is not FDA approved in patients less than 93 years old. Performed at [facility], [address], [place], [place] [address]     Radiology Studies: VAS US  LOWER EXTREMITY VENOUS (DVT) Result Date: 11/25/2023  Lower Venous DVT Study Patient Name:  SMILEY BIRR  Date of Exam:   11/25/2023 Medical Rec #: [id number]      Accession #:    [id number] Date of Birth: 02/17/1981      Patient Gender: M Patient Age:   57 years Exam Location:  [facility] Procedure:      VAS US  LOWER EXTREMITY VENOUS (DVT) Referring Phys: TERRY HURST --------------------------------------------------------------------------------  Indications: Bleeding from wound [[id number]].  Risk Factors: None identified. Limitations: Body habitus, poor ultrasound/tissue interface and patient leg contracture, patient positioning, patient immobility. Comparison Study: No prior studies. Performing Technologist:  Cordella Collet RVT  Examination Guidelines: A complete evaluation includes B-mode imaging, spectral Doppler, color Doppler, and power Doppler as needed of all accessible portions of each vessel. Bilateral testing is considered an integral part of a complete examination. Limited examinations for reoccurring indications may be performed as noted. The reflux portion of the exam is performed with the patient in reverse Trendelenburg.  +---------+---------------+---------+-----------+----------+-------------------+ RIGHT    CompressibilityPhasicitySpontaneityPropertiesThrombus Aging      +---------+---------------+---------+-----------+----------+-------------------+ CFV      Full           Yes      Yes                                      +---------+---------------+---------+-----------+----------+-------------------+ SFJ      Full                                                             +---------+---------------+---------+-----------+----------+-------------------+ FV Prox  Full                                                             +---------+---------------+---------+-----------+----------+-------------------+ FV Mid   Full                                                             +---------+---------------+---------+-----------+----------+-------------------+ FV DistalFull                                                             +---------+---------------+---------+-----------+----------+-------------------+  PFV                                                   Not well visualized +---------+---------------+---------+-----------+----------+-------------------+ POP                                                   Not well visualized +---------+---------------+---------+-----------+----------+-------------------+ PTV                                                   Not well visualized  +---------+---------------+---------+-----------+----------+-------------------+ PERO                                                  Not well visualized +---------+---------------+---------+-----------+----------+-------------------+   +---------+---------------+---------+-----------+----------+-------------------+ LEFT     CompressibilityPhasicitySpontaneityPropertiesThrombus Aging      +---------+---------------+---------+-----------+----------+-------------------+ CFV      Full           Yes      Yes                                      +---------+---------------+---------+-----------+----------+-------------------+ SFJ                                                   Not well visualized +---------+---------------+---------+-----------+----------+-------------------+ FV Prox                                               Not well visualized +---------+---------------+---------+-----------+----------+-------------------+ FV Mid                  Yes      Yes                                      +---------+---------------+---------+-----------+----------+-------------------+ FV DistalFull                                                             +---------+---------------+---------+-----------+----------+-------------------+ PFV                                                   Not well visualized +---------+---------------+---------+-----------+----------+-------------------+ POP  Not well visualized +---------+---------------+---------+-----------+----------+-------------------+ PTV      Full                                                             +---------+---------------+---------+-----------+----------+-------------------+ PERO     Full                                                             +---------+---------------+---------+-----------+----------+-------------------+      Summary: RIGHT: - There is no evidence of deep vein thrombosis in the lower extremity. However, portions of this examination were limited- see technologist comments above.  - No cystic structure found in the popliteal fossa.  LEFT: - There is no evidence of deep vein thrombosis in the lower extremity. However, portions of this examination were limited- see technologist comments above.  - No cystic structure found in the popliteal fossa.  *See table(s) above for measurements and observations.    Preliminary     Scheduled Meds:  sodium chloride    Intravenous Once   Chlorhexidine  Gluconate Cloth  6 each Topical Daily   clonazePAM   1 mg Per Tube BID   enoxaparin  (LOVENOX ) injection  30 mg Subcutaneous Daily   famotidine   40 mg Per Tube BID   feeding supplement (PROSource TF20)  30 mL Per Tube BID   free water   200 mL Per Tube Q4H   nutrition supplement (JUVEN)  1 packet Per Tube BID   mouth rinse  15 mL Mouth Rinse Q2H   oxidized cellulose  1 each Topical Once   sertraline   100 mg Oral Daily   Continuous Infusions:  feeding supplement (OSMOLITE 1.5 CAL) 50 mL/hr at 11/25/23 1500   piperacillin -tazobactam 12.5 mL/hr at 11/25/23 1500   vancomycin  Stopped (11/25/23 1010)     LOS: 1 day   35 minutes with more than 50% spent in reviewing records, counseling patient/family and coordinating care.  Reyes VEAR Gaw, MD [facility] [email or web address] 11/25/2023, 4:03 PM

## 2023-11-25 NOTE — Plan of Care (Signed)

## 2023-11-26 DIAGNOSIS — T148XXA Other injury of unspecified body region, initial encounter: Secondary | ICD-10-CM | POA: Diagnosis not present

## 2023-11-26 DIAGNOSIS — J9611 Chronic respiratory failure with hypoxia: Secondary | ICD-10-CM | POA: Diagnosis not present

## 2023-11-26 LAB — TYPE AND SCREEN
ABO/RH(D): O POS
Antibody Screen: NEGATIVE
Unit division: 0
Unit division: 0

## 2023-11-26 LAB — BPAM RBC
Blood Product Expiration Date: 202508202359
Blood Product Expiration Date: 202508212359
ISSUE DATE / TIME: 202507260000
ISSUE DATE / TIME: 202507271648
ISSUING PHYSICIAN: 202507271648
Unit Type and Rh: 202508202359
Unit Type and Rh: 5100
Unit Type and Rh: 5100

## 2023-11-26 LAB — CBC
HCT: 29.8 % — ABNORMAL LOW (ref 39.0–52.0)
Hemoglobin: 9.4 g/dL — ABNORMAL LOW (ref 13.0–17.0)
MCH: 25.5 pg — ABNORMAL LOW (ref 26.0–34.0)
MCHC: 31.5 g/dL (ref 30.0–36.0)
MCV: 80.8 fL (ref 80.0–100.0)
Platelets: 473 K/uL — ABNORMAL HIGH (ref 150–400)
RBC: 3.69 MIL/uL — ABNORMAL LOW (ref 4.22–5.81)
RDW: 17.3 % — ABNORMAL HIGH (ref 11.5–15.5)
WBC: 6.2 K/uL (ref 4.0–10.5)
nRBC: 0 % (ref 0.0–0.2)

## 2023-11-26 LAB — GLUCOSE, CAPILLARY
Glucose-Capillary: 138 mg/dL — ABNORMAL HIGH (ref 70–99)
Glucose-Capillary: 140 mg/dL — ABNORMAL HIGH (ref 70–99)
Glucose-Capillary: 145 mg/dL — ABNORMAL HIGH (ref 70–99)

## 2023-11-26 NOTE — Evaluation (Signed)
 Passy-Muir Speaking Valve - Evaluation Patient Details  Name: [name] MRN: [id number] Date of Birth: 07-12-1980  Today's Date: 11/26/2023 Time: 1009-1033 SLP Time Calculation (min) (ACUTE ONLY): 24 min  Past Medical History:  Past Medical History:  Diagnosis Date   Acute respiratory failure with hypoxia (HCC) 02/01/2023   Cognitive communication deficit    Depression    Diabetes mellitus without complication (HCC)    Dysarthria and anarthria    Eye abnormalities    unspecified by pt   Hypertension    Multiple sclerosis (HCC)    Paraplegia (HCC)    Severe sepsis due to aspiration pneumonia (HCC) 01/31/2023   Weakness    generalized   Past Surgical History:  Past Surgical History:  Procedure Laterality Date   LAPAROSCOPIC INSERTION GASTROSTOMY TUBE N/A 02/02/2023   Procedure: LAPAROSCOPIC INSERTION GASTROSTOMY TUBE;  Surgeon: Stevie Herlene Righter, MD;  Location: [facility];  Service: General;  Laterality: N/A;   RADIOLOGY WITH ANESTHESIA N/A 11/11/2021   Procedure: MRI WITH ANESTHESIA BRAIN MRI WITH T SPINE AND CERVICAL SPINE W/O CONTRAST;  Surgeon: Radiologist, Medication, MD;  Location: [facility] OR;  Service: Radiology;  Laterality: N/A;   WISDOM TOOTH EXTRACTION     HPI:  [name] is a 43 yo male with PMH including MS s/p chronic trach/vent and PEG, stage IV sacral decubitus ulcer with history of osteomyelitis, recurrent aspiration PNA. He presents to ED from [facility] 7/25 for a wound check secondary to bleeding from his sacral wound s/p debridement. Received 1 unit PRBC in ED. Known to SLP team with most recent swallow evaluation raising concern for progression of chronic dysphagia vs acute decline given infection at that time. Per note 02/01/23, with a thicker viscosity (tested with his baseline honey thick liquids), he has at least enough lingual control to propel the bolus posteriorly, but there is still no hyolaryngeal movement noted to suggest that he has swallowed (although  pt says he has). He does not swallow or cough to command, and delayed spontaneous coughing sounds weak (not productive), and wet. His baseline level of function is not entirely clear, but if he has been swallowing at all, then this could reflect a more acute change.    Assessment / Plan / Recommendation  Clinical Impression  Pt was drowsy but overall attentive to PMSV trials. There is no prior documentation of attempts at PMSV and pt denies using one PTA. The cuff was deflated with stable VS and immediate tracheal expectoration of secretions, which are thin but copious. PMSV was donned for ~10 minutes without noted back pressure. He continued to shake/nod his head for yes/no questions pertaining to his wants/needs but was unable to voice. This may be secondary to decreased respiratory support for phonation or cognitive deficits. While there were attempts to clear his throat and cough, he was unable to mobilize and expectorate secretions orally. Overall, he tolerated wearing the PSMV but question how functional it will be for communication without further exploration of cognitive-linguistic function. Discussed recommendations to wear PMSV with full supervision and posted sign at [facility]. SLP will continue following. SLP Visit Diagnosis: Aphonia (R49.1)    Recommendations for use/ supervision  Patient may use Passy-Muir Speech Valve: Intermittently with supervision;During all therapies with supervision PMSV Supervision: Full   SLP Assessment  Patient needs continued Speech Language Pathology Services   Assistance Recommended at Discharge Frequent or constant Supervision/Assistance  Functional Status Assessment Patient has had a recent decline in their functional status and demonstrates the ability  to make significant improvements in function in a reasonable and predictable amount of time.  Frequency and Duration min 2x/week  2 weeks    PMSV Trial PMSV was placed for: 10 minutes Able to redirect  subglottic air through upper airway: Yes Able to Attain Phonation: No Able to Expectorate Secretions: Yes Level of Secretion Expectoration with PMSV: Tracheal Breath Support for Phonation: Adequate Intelligibility: Unable to assess (comment) Respirations During Trial: 23 (16-30) SpO2 During Trial: 96 % (92-100) Pulse During Trial: 85 Behavior: Alert;Good eye contact   Tracheostomy Tube       Vent Dependency  FiO2 (%): 28 %    Cuff Deflation Trial Tolerated Cuff Deflation: Yes Length of Time for Cuff Deflation Trial: >15 minutes Behavior: Alert;Good eye contact         Damien Blumenthal, M.A., CCC-SLP Speech Language Pathology, Acute Rehabilitation Services  Secure Chat preferred [phone]  11/26/2023, 11:14 AM

## 2023-11-26 NOTE — Evaluation (Signed)
 Speech Language Pathology Evaluation Patient Details Name: [name] MRN: [id number] DOB: 07-Mar-1981 Today's Date: 11/26/2023 Time: 8990-8966 SLP Time Calculation (min) (ACUTE ONLY): 24 min  Problem List:  Patient Active Problem List   Diagnosis Date Noted   Bleeding from wound 11/23/2023   Pressure injury of skin 05/24/2023   Severe sepsis (HCC) 05/13/2023   Hypomagnesemia 04/11/2023   Normocytic anemia 04/10/2023   Sacral decubitus ulcer, stage IV (HCC) 04/10/2023   Sepsis due to osteomyelitis of the coccyx and sacrum (HCC) 04/09/2023   Malnutrition of moderate degree 02/02/2023   History of pulmonary embolus (PE) 02/01/2023   Hypernatremia 02/01/2023   Hypokalemia 02/01/2023   Dysphagia 02/01/2023   Muscle spasms of neck    Neuropathic pain    Vitamin B12 deficiency    Hypoalbuminemia    Urinary frequency    Tobacco abuse    Steroid-induced hyperglycemia    Marijuana abuse, continuous 04/12/2017   Vitamin D  deficiency 04/12/2017   Multiple sclerosis pseudoexacerbation (HCC)    Abnormality of gait 05/10/2016   Hypertension    Depression    Multiple sclerosis (HCC) 03/02/2011   Past Medical History:  Past Medical History:  Diagnosis Date   Acute respiratory failure with hypoxia (HCC) 02/01/2023   Cognitive communication deficit    Depression    Diabetes mellitus without complication (HCC)    Dysarthria and anarthria    Eye abnormalities    unspecified by pt   Hypertension    Multiple sclerosis (HCC)    Paraplegia (HCC)    Severe sepsis due to aspiration pneumonia (HCC) 01/31/2023   Weakness    generalized   Past Surgical History:  Past Surgical History:  Procedure Laterality Date   LAPAROSCOPIC INSERTION GASTROSTOMY TUBE N/A 02/02/2023   Procedure: LAPAROSCOPIC INSERTION GASTROSTOMY TUBE;  Surgeon: Stevie Herlene Righter, MD;  Location: [facility];  Service: General;  Laterality: N/A;   RADIOLOGY WITH ANESTHESIA N/A 11/11/2021   Procedure: MRI WITH ANESTHESIA  BRAIN MRI WITH T SPINE AND CERVICAL SPINE W/O CONTRAST;  Surgeon: Radiologist, Medication, MD;  Location: [facility] OR;  Service: Radiology;  Laterality: N/A;   WISDOM TOOTH EXTRACTION     HPI:  KNOWLEDGE ESCANDON is a 43 yo male with PMH including MS s/p chronic trach/vent and PEG, stage IV sacral decubitus ulcer with history of osteomyelitis, recurrent aspiration PNA. He presents to ED from [facility] 7/25 for a wound check secondary to bleeding from his sacral wound s/p debridement. Received 1 unit PRBC in ED. Known to SLP team with most recent swallow evaluation raising concern for progression of chronic dysphagia vs acute decline given infection at that time. Per note 02/01/23, with a thicker viscosity (tested with his baseline honey thick liquids), he has at least enough lingual control to propel the bolus posteriorly, but there is still no hyolaryngeal movement noted to suggest that he has swallowed (although pt says he has). He does not swallow or cough to command, and delayed spontaneous coughing sounds weak (not productive), and wet. His baseline level of function is not entirely clear, but if he has been swallowing at all, then this could reflect a more acute change.   Assessment / Plan / Recommendation Clinical Impression  Pt's PLOF and cognitive-linguistic function are overall unclear. He nods yes/no to questions related to functional wants/needs but his answers become more inconsistent as they pertain to biographical information and history. Attempted to use a communication board but pt is unable to point effectively due to reduced strength and  bilateral contractures. More assessment is necessary overall but suspect if pt is able to use a communication board, it may need to be done with eye gaze. SLP will continue following.    SLP Assessment  SLP Recommendation/Assessment: Patient needs continued Speech Language Pathology Services SLP Visit Diagnosis: Cognitive communication deficit (R41.841)      Assistance Recommended at Discharge  Frequent or constant Supervision/Assistance  Functional Status Assessment Patient has had a recent decline in their functional status and demonstrates the ability to make significant improvements in function in a reasonable and predictable amount of time.  Frequency and Duration min 2x/week  2 weeks      SLP Evaluation Cognition  Overall Cognitive Status: Difficult to assess Arousal/Alertness: Awake/alert Orientation Level: Oriented to person Attention: Sustained Sustained Attention: Impaired Sustained Attention Impairment: Functional basic Awareness: Impaired Awareness Impairment: Emergent impairment       Comprehension  Auditory Comprehension Overall Auditory Comprehension: Appears within functional limits for tasks assessed    Expression Expression Primary Mode of Expression: Verbal   Oral / Motor  Oral Motor/Sensory Function Overall Oral Motor/Sensory Function: Generalized oral weakness Facial ROM: Within Functional Limits Facial Symmetry: Within Functional Limits Facial Strength: Within Functional Limits Facial Sensation: Within Functional Limits Lingual ROM: Reduced right;Reduced left Lingual Strength: Reduced Motor Speech Overall Motor Speech: Impaired Respiration: Impaired Level of Impairment: Word Intelligibility: Unable to assess (comment)            Damien Blumenthal, M.A., CCC-SLP Speech Language Pathology, Acute Rehabilitation Services  Secure Chat preferred [phone]  11/26/2023, 11:31 AM

## 2023-11-26 NOTE — Discharge Summary (Signed)
 Physician Discharge Summary   Patient: [name] MRN: [id number] DOB: 1980-05-21  Admit date:     11/23/2023  Discharge date: 11/26/23  Discharge Physician: Reyes VEAR Gaw   PCP: Fleeta Valeria Mayo, MD   Recommendations at discharge:    Patient continued on outpt antibiotics started for sacral osteomyelitis.  Wound looked good here, afebrile, no white count.  Recommend consideration of stopping abx outpt. Patient was on treatment dose of lovenox  outpatient, which likely exacerbated bleeding from sacral decub ulcer debridement.  Last documented PE was October 2024.  We kept him on PPX dose of lovenox  here, recommendations to switch to PPX dose of lovenox  vs DOAC outpatient to prevent further episodes of bleeding.    Discharge Diagnoses: Principal Problem:   Bleeding from wound  Resolved Problems:   * No resolved hospital problems. *  Hospital Course: 43 y.o. male with medical history significant for end-stage multiple sclerosis and stage IV sacral decubitus ulcer with history of osteomyelitis, on chronic vent support, who presents to the ER sent from [facility] due to bleeding sacral wound.  Reportedly the patient had his sacral wound debrided yesterday at [facility].  Due to excessive bleeding and acute blood loss anemia, he was sent to the ER for further management.  Admitted to [facility] for same.  S/p 2 units PRBCs and Hgb rose to 9.4 from admit Hgb of 7.1 and patient had no further bleeding while in house.  Wound cleansed and dressing changed daily.  Due to stabilized Hgb, and otherwise overall improvement, patient discharged back to LT[facility] 11/26/2023.    Of note, patient has been on IV abx for sacral osteomyelitis.  Wound looked very good here, pt afebrile, no leukocytosis.  Recommended to trial off abx but defer to treating facility.    Also of note, patient on treatment dose lovenox .  Last documented PE was Oct 2024.  Anticoagulation obviously contributed to blood loss anemia.  He was kept on  PPX dose of lovenox  here.  Recommendation at discharge to consider DOAC vs PPX lovenox .       Assessment & Plan: Bleeding from stage IV sacral wound, POA Reportedly post-debridement on 11/22/2023 No further bleeding since admission.   - Status post 2 units PRBC's.  Hgb stable/increased up to 9 today.     History of osteomyelitis involving stage IV sacral decubitus, POA Prior to admission on IV vancomycin  and IV Zosyn  x 14 days, start date 11/19/2023. Remains on these.  See above   End-stage multiple sclerosis Fairly nonverbal.  He is able to shake his head yes/no. - SLP consulted, unable to provide much more in the way of communication.  Pt not able to use communication board, recommendation would be for him to use his eyes to point rather than hands due to lack of strength.     Ventilatory dependent Greatly appreciate pulmonology input for vent management. Currently O2 saturation remains good.   Severe protein calorie malnutrition BMI 18, albumin 1.9 Severe muscle mass loss Resume PEG tube feeding   Bedbound status Continue Prevalon boots to reduce pressure to bilateral heels Continue pressure alleviating devices Continue to optimize nutritional status Follow-up bilateral duplex ultrasound to rule out DVTs prior to placing SCDs.       Consultants: PCCM for vent management Procedures performed: none  Disposition: Long term care facility Diet recommendation:  Discharge Diet Orders (From admission, onward)     Start     Ordered   11/26/23 0000  Diet - low sodium  heart healthy        11/26/23 1223           DISCHARGE MEDICATION: Allergies as of 11/26/2023   No Known Allergies      Medication List     TAKE these medications    acetaminophen  325 MG tablet Commonly known as: TYLENOL  Place 650 mg into feeding tube every 8 (eight) hours as needed for fever, mild pain (pain score 1-3) or moderate pain (pain score 4-6).   acetaminophen  325 MG tablet Commonly  known as: TYLENOL  Take 325 mg by mouth every 6 (six) hours as needed for fever.   clonazePAM  1 MG disintegrating tablet Commonly known as: KLONOPIN  Place 1 tablet (1 mg total) into feeding tube 2 (two) times daily.   doxazosin 2 MG tablet Commonly known as: CARDURA Take 2 mg by mouth in the morning.   enoxaparin  60 MG/0.6ML injection Commonly known as: LOVENOX  Inject 60 mg into the skin every 12 (twelve) hours.   famotidine  40 MG/5ML suspension Commonly known as: PEPCID  Place 40 mg into feeding tube 2 (two) times daily.   feeding supplement (OSMOLITE 1.5 CAL) Liqd Place 1,000 mLs into feeding tube continuous.   Arginaid Pack Place 1 packet into feeding tube in the morning and at bedtime.   free water  Soln Place 200 mLs into feeding tube every 4 (four) hours.   Glucagon Emergency 1 MG Kit Inject 1 mg into the muscle as needed.   guaiFENesin  200 MG tablet Place 200 mg into feeding tube every 4 (four) hours as needed for cough or to loosen phlegm.   Gvoke HypoPen 1-Pack 1 MG/0.2ML Soaj Generic drug: Glucagon Inject 1 Dose into the skin as needed.   insulin  lispro 100 UNIT/ML injection Commonly known as: HUMALOG Inject 0-10 Units into the skin 3 (three) times daily before meals. Sliding Scale Insulin : Insulin  Units < 70 or >400 Notify MD; 0-150 units, 0 units; 151-200, 2 units; 201-250,  4 units; 251-300, 6 units; 301-350, 8 units; 351-400, 10 units.   ipratropium-albuterol  0.5-2.5 (3) MG/3ML Soln Commonly known as: DUONEB Take 3 mLs by nebulization every 6 (six) hours as needed.   lidocaine  2 % solution Commonly known as: XYLOCAINE  Use as directed 15 mLs in the mouth or throat as needed for mouth pain.   ondansetron  4 MG tablet Commonly known as: ZOFRAN  Place 4 mg into feeding tube as needed for nausea or vomiting.   oxyCODONE  5 MG immediate release tablet Commonly known as: Oxy IR/ROXICODONE  Place 1 tablet (5 mg total) into feeding tube every 6 (six) hours as  needed for moderate pain (pain score 4-6).   piperacillin -tazobactam 3.375 GM/50ML IVPB Commonly known as: ZOSYN  Inject 3.375 g into the vein every 8 (eight) hours. For 14 days   Pro-Stat Liqd Place 30 mLs into feeding tube in the morning and at bedtime.   propranolol  20 MG tablet Commonly known as: INDERAL  Place 1 tablet (20 mg total) into feeding tube 3 (three) times daily.   sertraline  100 MG tablet Commonly known as: ZOLOFT  Take 100 mg by mouth in the morning.   thiamine  100 MG tablet Commonly known as: Vitamin B-1 Place 1 tablet (100 mg total) into feeding tube daily.   Vancomycin  1.5 g Solr injection Commonly known as: VANCOCIN  Inject 1.5 g into the vein every 12 (twelve) hours. For 14 days               Discharge Care Instructions  (From admission, onward)  Start     Ordered   11/26/23 0000  Discharge wound care:       Comments: Bilateral trochanter and Sacrum - Cleanse with Vashe #[id number], pat dry the skin surrounding, not rinse. Apply one layer of Aquacel [id number], touching the wound bed tissue, fill with gauze the dept to make sure the Aquacel will maintain inserted. Cover with foam dressing, change every 2 days or PRN.   11/26/23 1223            Discharge Exam: Filed Weights   11/23/23 1636  Weight: 59 kg    Condition at discharge: fair  The results of significant diagnostics from this hospitalization (including imaging, microbiology, ancillary and laboratory) are listed below for reference.   Imaging Studies: VAS US  LOWER EXTREMITY VENOUS (DVT) Result Date: 11/25/2023  Lower Venous DVT Study Patient Name:  KEONA SHEFFLER  Date of Exam:   11/25/2023 Medical Rec #: [id number]      Accession #:    [id number] Date of Birth: Feb 26, 1981      Patient Gender: M Patient Age:   43 years Exam Location:  [facility] Procedure:      VAS US  LOWER EXTREMITY VENOUS (DVT) Referring Phys: TERRY HURST  --------------------------------------------------------------------------------  Indications: Bleeding from wound [721060].  Risk Factors: None identified. Limitations: Body habitus, poor ultrasound/tissue interface and patient leg contracture, patient positioning, patient immobility. Comparison Study: No prior studies. Performing Technologist: Cordella Collet RVT  Examination Guidelines: A complete evaluation includes B-mode imaging, spectral Doppler, color Doppler, and power Doppler as needed of all accessible portions of each vessel. Bilateral testing is considered an integral part of a complete examination. Limited examinations for reoccurring indications may be performed as noted. The reflux portion of the exam is performed with the patient in reverse Trendelenburg.  +---------+---------------+---------+-----------+----------+-------------------+ RIGHT    CompressibilityPhasicitySpontaneityPropertiesThrombus Aging      +---------+---------------+---------+-----------+----------+-------------------+ CFV      Full           Yes      Yes                                      +---------+---------------+---------+-----------+----------+-------------------+ SFJ      Full                                                             +---------+---------------+---------+-----------+----------+-------------------+ FV Prox  Full                                                             +---------+---------------+---------+-----------+----------+-------------------+ FV Mid   Full                                                             +---------+---------------+---------+-----------+----------+-------------------+ FV DistalFull                                                             +---------+---------------+---------+-----------+----------+-------------------+  PFV                                                   Not well visualized  +---------+---------------+---------+-----------+----------+-------------------+ POP                                                   Not well visualized +---------+---------------+---------+-----------+----------+-------------------+ PTV                                                   Not well visualized +---------+---------------+---------+-----------+----------+-------------------+ PERO                                                  Not well visualized +---------+---------------+---------+-----------+----------+-------------------+   +---------+---------------+---------+-----------+----------+-------------------+ LEFT     CompressibilityPhasicitySpontaneityPropertiesThrombus Aging      +---------+---------------+---------+-----------+----------+-------------------+ CFV      Full           Yes      Yes                                      +---------+---------------+---------+-----------+----------+-------------------+ SFJ                                                   Not well visualized +---------+---------------+---------+-----------+----------+-------------------+ FV Prox                                               Not well visualized +---------+---------------+---------+-----------+----------+-------------------+ FV Mid                  Yes      Yes                                      +---------+---------------+---------+-----------+----------+-------------------+ FV DistalFull                                                             +---------+---------------+---------+-----------+----------+-------------------+ PFV                                                   Not well visualized +---------+---------------+---------+-----------+----------+-------------------+ POP  Not well visualized +---------+---------------+---------+-----------+----------+-------------------+  PTV      Full                                                             +---------+---------------+---------+-----------+----------+-------------------+ PERO     Full                                                             +---------+---------------+---------+-----------+----------+-------------------+     Summary: RIGHT: - There is no evidence of deep vein thrombosis in the lower extremity. However, portions of this examination were limited- see technologist comments above.  - No cystic structure found in the popliteal fossa.  LEFT: - There is no evidence of deep vein thrombosis in the lower extremity. However, portions of this examination were limited- see technologist comments above.  - No cystic structure found in the popliteal fossa.  *See table(s) above for measurements and observations.    Preliminary     Microbiology: Results for orders placed or performed during the hospital encounter of 11/23/23  MRSA Next Gen by PCR, Nasal     Status: None   Collection Time: 11/23/23 10:09 PM   Specimen: Nasal Mucosa; Nasal Swab  Result Value Ref Range Status   MRSA by PCR Next Gen NOT DETECTED NOT DETECTED Final    Comment: (NOTE) The GeneXpert MRSA Assay (FDA approved for NASAL specimens only), is one component of a comprehensive MRSA colonization surveillance program. It is not intended to diagnose MRSA infection nor to guide or monitor treatment for MRSA infections. Test performance is not FDA approved in patients less than 32 years old. Performed at [facility], [address], [place], [place] [address]     Labs: CBC: Recent Labs  Lab 11/23/23 1644 11/23/23 2233 11/24/23 0059 11/24/23 0711 11/25/23 0259 11/26/23 0418  WBC 4.9  --   --  5.5 5.1 6.2  NEUTROABS 2.9  --   --   --   --   --   HGB 7.1* 7.2* 7.5* 7.9* 7.1* 9.4*  HCT 24.0* 24.3* 22.0* 26.6* 23.3* 29.8*  MCV 80.3  --   --  80.6 79.8* 80.8  PLT 395  --   --  400 407* 473*   Basic Metabolic  Panel: Recent Labs  Lab 11/23/23 1644 11/24/23 0059 11/24/23 0601 11/25/23 0259  NA 135 138 136 137  K 4.0 3.7 3.8 3.8  CL 105  --  102 105  CO2 23  --  24 24  GLUCOSE 97  --  133* 140*  BUN 13  --  14 16  CREATININE 0.32*  --  0.43* 0.36*  CALCIUM 8.7*  --  8.6* 8.4*  MG  --   --  1.7  --   PHOS  --   --  3.4  --    Liver Function Tests: Recent Labs  Lab 11/23/23 1644  AST 36  ALT 54*  ALKPHOS 51  BILITOT 0.5  PROT 7.0  ALBUMIN 1.9*   CBG: Recent Labs  Lab 11/25/23 1907 11/25/23 2301 11/26/23 0312 11/26/23 0729 11/26/23 1118  GLUCAP  130* 131* 138* 140* 145*    Discharge time spent: less than 30 minutes.  Signed: Reyes VEAR Gaw, MD [facility] 11/26/2023

## 2023-11-26 NOTE — Progress Notes (Signed)
 NAME:  [name], MRN:  [id number], DOB:  [birth year], LOS: 2 ADMISSION DATE:  11/23/2023, CONSULTATION DATE:  11/24/23 REFERRING MD:  [facility] - [facility] , CHIEF COMPLAINT:  wound check   History of Present Illness:  43 yo M PMH end stage MS, stg IV decub, osteomyelitis, chronic trach vent who presented to ED 11/23/23 from [facility] for wound check-- has had bleeding from his sacral wound post debridement.  Admitted to [facility] Case d/w [facility] who rec local wound care unless persistent bleeding  PCCM consulted for vent management    Pertinent  Medical History  MS Chronic trach vent Osteo  Sacral decub   Significant Hospital Events: Including procedures, antibiotic start and stop dates in addition to other pertinent events   7/25 admit to [facility] for wound 7/26 pccm consult for trach vent. PSV trial 7/27 will try ATC, received one unit of PRBC  7/28 Hgb up to 9.4, no issues overnight   Interim History / Subjective:  Seen resting in bed, no acute complaints   Objective    Blood pressure 115/86, pulse 91, temperature 98.1 F (36.7 C), temperature source Axillary, resp. rate 15, height 5' 10 (1.778 m), weight 59 kg, SpO2 100%.    Vent Mode: PRVC FiO2 (%):  [28 %-30 %] 30 % Set Rate:  [12 bmp] 12 bmp Vt Set:  [580 mL] 580 mL PEEP:  [5 cmH20] 5 cmH20 Plateau Pressure:  [7 cmH20-8 cmH20] 8 cmH20   Intake/Output Summary (Last 24 hours) at 11/26/2023 9285 Last data filed at 11/26/2023 0400 Gross per 24 hour  Intake 3576.85 ml  Output 1800 ml  Net 1776.85 ml   Filed Weights   11/23/23 1636  Weight: 59 kg    Examination: General: Acute on chronically ill appearing thin adult male lying in bed on mechanical ventilation, in NAD HEENT: Trach midline, MM pink/moist, PERRL,  Neuro: Alert and oriented, able to follow commands  CV: s1s2 regular rate and rhythm, no murmur, rubs, or gallops,  PULM:  Clear to auscultation, no increased work of breathing, no added breath sounds  GI: soft, bowel  sounds active in all 4 quadrants, non-tender, non-distended, tolerating TF Extremities: warm/dry, no edema  Skin: no rashes or lesions  Resolved problem list   Assessment and Plan   Chronic respiratory failure Chronic trach vent Ventilator management  End stage MS  P: Continue daily ATC trials with vent support at night  daily Continue ventilator support with lung protective strategies  Wean PEEP and FiO2 for sats greater than 90%. Head of bed elevated 30 degrees. Plateau pressures less than 30 cm H20.  Follow intermittent chest x-ray and ABG.   SAT/SBT as tolerated, mentation preclude extubation  Ensure adequate pulmonary hygiene  Follow cultures  VAP bundle in place  PAD protocol  Best Practice (right click and Reselect all SmartList Selections daily)   Per primary   CRITICAL CARE Performed by: Jacobey Gura D. Grant   Total critical care time: 38 minutes  Critical care time was exclusive of separately billable procedures and treating other patients.  Critical care was necessary to treat or prevent imminent or life-threatening deterioration.  Critical care was time spent personally by me on the following activities: development of treatment plan with patient and/or surrogate as well as nursing, discussions with consultants, evaluation of patient's response to treatment, examination of patient, obtaining history from patient or surrogate, ordering and performing treatments and interventions, ordering and review of laboratory studies, ordering and review of radiographic studies,  pulse oximetry and re-evaluation of patient's condition.  Henry Brunty D. Harris, NP-C [facility] Personal contact information can be found on Amion  If no contact or response made please call [phone] 11/26/2023, 7:17 AM

## 2023-11-26 NOTE — Progress Notes (Signed)
 Initial Nutrition Assessment  DOCUMENTATION CODES:  Not applicable  INTERVENTION:  PEG tube feeding regimen: Osmolite 1.5 at 50ml/hr (1200ml per day) 60ml ProSource TF20 BID free water  flushes q4h ( total) Provides 1960 kcal, 115g protein, total free water  daily  Juven BID to support wound healing needs  NUTRITION DIAGNOSIS:   Increased nutrient needs related to wound healing as evidenced by estimated needs  GOAL:   Patient will meet greater than or equal to 90% of their needs  MONITOR:  Vent status, Labs, Weight trends, TF tolerance, I & O's, Skin  REASON FOR ASSESSMENT:  Ventilator    ASSESSMENT:  Pt admitted from [facility] with c/o bleeding from sacral wound post debridement. PMH significant for end stage multiple sclerosis and stage IV sacral decubitus ulcer with history of osteomyelitis, chronic vent support..  Patient remains on vent support via chronic trach.  Patient discussed in interdisciplinary rounds.  Hemoglobin is stable. Plans to return to [facility] today.   Checked in with patient at bedside. Tolerating tube feeding well.  Admit weight documented to be 59 kg. However uncertain whether this is reported versus actual measurement via bed scale. No further weights on file. Last documented weight in January noted to be 72.4 kg. If admit weight is accurate, pt has experienced a weight loss of 18.5% which is clinically significant for time frame.   Given patient's bed bound status and progressive multiple sclerosis, would expect lower extremity muscle deficits. Upon nutrition focused physical exam, pt observed to have upper body/facial muscle and subcutaneous fat depletions. This is suggestive of malnutrition however given unclear weight trends and limited nutrition history, unable to diagnose at this time.   Drains/lines: PEG tube UOP: x24 hours  Medications: IV abx x2  Labs:  Hgb 9.4 CBG's 98-140 x24 hours  NUTRITION - FOCUSED  PHYSICAL EXAM: Flowsheet Row Most Recent Value  Orbital Region Mild depletion  Upper Arm Region Severe depletion  Thoracic and Lumbar Region Severe depletion  Buccal Region Moderate depletion  Temple Region Moderate depletion  Clavicle Bone Region Severe depletion  Clavicle and Acromion Bone Region Severe depletion  Scapular Bone Region Unable to assess  Dorsal Hand Severe depletion  Patellar Region Severe depletion  [contracted]  Anterior Thigh Region Severe depletion  Posterior Calf Region Unable to assess  [prevalon boots]  Edema (RD Assessment) None  Hair Reviewed  Eyes Unable to assess  Mouth Unable to assess  Skin Reviewed  Nails Reviewed    Diet Order:   Diet Order             Diet - low sodium heart healthy                   EDUCATION NEEDS:  No education needs have been identified at this time  Skin:  Skin Assessment: Skin Integrity Issues: Skin Integrity Issues:: Stage II, Other (Comment), DTI DTI: R ear Stage II: R elbow Other: sacral decubitus ulcer  Last BM:  7/28 type 7 medium  Height:  Ht Readings from Last 1 Encounters:  11/26/23 5' 10 (1.778 m)    Weight:  Wt Readings from Last 1 Encounters:  11/23/23 59 kg   BMI:  Body mass index is 18.65 kg/m.  Estimated Nutritional Needs:   Kcal:  1900-2100  Protein:  105-120g  Fluid:  >/=1.9L  Royce Maris, RDN, LDN Clinical Nutrition See AMiON for contact information.

## 2023-11-26 NOTE — TOC Transition Note (Addendum)
 Transition of Care [facility]) - Discharge Note   Patient Details  Name: [name] MRN: [id number] Date of Birth: 1981/03/12  Transition [facility] CM/SW Contact:  Luann SHAUNNA Cumming, LCSW Phone Number: 11/26/2023, 12:45 PM   Clinical Narrative:     Per MD patient ready for DC to [facility]. RN, patient's family, and facility notified of DC. Discharge Summary sent to facility. RN to call report prior to discharge [phone].  ) RN to arrange [facility] transport.   CSW will sign off for now as social work intervention is no longer needed. Please consult us  again if new needs arise.   Final next level of care: Skilled Nursing Facility Barriers to Discharge: No Barriers Identified     Discharge Placement              Patient chooses bed at:  ([facility]) Patient to be transferred to facility by: [facility] Name of family member notified: Brother Kennetj Patient and family notified of of transfer: 11/26/23  Discharge Plan and Services Additional resources added to the After Visit Summary for                                       Social Drivers of Health (SDOH) Interventions SDOH Screenings   Food Insecurity: No Food Insecurity (04/09/2023)  Housing: Low Risk  (04/09/2023)  Transportation Needs: No Transportation Needs (04/09/2023)  Utilities: Not At Risk (04/09/2023)  Tobacco Use: High Risk (11/23/2023)     Readmission Risk Interventions     No data to display

## 2023-11-27 DIAGNOSIS — R5381 Other malaise: Secondary | ICD-10-CM

## 2023-11-27 DIAGNOSIS — L98429 Non-pressure chronic ulcer of back with unspecified severity: Secondary | ICD-10-CM | POA: Diagnosis not present

## 2023-11-27 DIAGNOSIS — J9601 Acute respiratory failure with hypoxia: Secondary | ICD-10-CM | POA: Diagnosis not present

## 2023-11-27 DIAGNOSIS — I2782 Chronic pulmonary embolism: Secondary | ICD-10-CM | POA: Diagnosis not present

## 2023-11-27 DIAGNOSIS — G35 Multiple sclerosis: Secondary | ICD-10-CM | POA: Diagnosis not present

## 2023-12-04 DIAGNOSIS — R5381 Other malaise: Secondary | ICD-10-CM

## 2023-12-04 DIAGNOSIS — J9601 Acute respiratory failure with hypoxia: Secondary | ICD-10-CM | POA: Diagnosis not present

## 2023-12-04 DIAGNOSIS — I2782 Chronic pulmonary embolism: Secondary | ICD-10-CM | POA: Diagnosis not present

## 2023-12-04 DIAGNOSIS — L98429 Non-pressure chronic ulcer of back with unspecified severity: Secondary | ICD-10-CM | POA: Diagnosis not present

## 2023-12-04 DIAGNOSIS — G35 Multiple sclerosis: Secondary | ICD-10-CM | POA: Diagnosis not present

## 2023-12-09 ENCOUNTER — Other Ambulatory Visit: Payer: Self-pay | Admitting: Internal Medicine

## 2023-12-09 MED ORDER — CLONAZEPAM 1 MG PO TABS: 1.0000 mg | ORAL_TABLET | Freq: Two times a day (BID) | ORAL | 2 refills | Status: AC

## 2023-12-11 DIAGNOSIS — G35 Multiple sclerosis: Secondary | ICD-10-CM | POA: Diagnosis not present

## 2023-12-11 DIAGNOSIS — L98429 Non-pressure chronic ulcer of back with unspecified severity: Secondary | ICD-10-CM | POA: Diagnosis not present

## 2023-12-11 DIAGNOSIS — I2782 Chronic pulmonary embolism: Secondary | ICD-10-CM | POA: Diagnosis not present

## 2023-12-11 DIAGNOSIS — R5381 Other malaise: Secondary | ICD-10-CM

## 2023-12-11 DIAGNOSIS — J9601 Acute respiratory failure with hypoxia: Secondary | ICD-10-CM | POA: Diagnosis not present

## 2023-12-18 DIAGNOSIS — I2782 Chronic pulmonary embolism: Secondary | ICD-10-CM | POA: Diagnosis not present

## 2023-12-18 DIAGNOSIS — J9601 Acute respiratory failure with hypoxia: Secondary | ICD-10-CM | POA: Diagnosis not present

## 2023-12-18 DIAGNOSIS — G35 Multiple sclerosis: Secondary | ICD-10-CM | POA: Diagnosis not present

## 2023-12-18 DIAGNOSIS — L98429 Non-pressure chronic ulcer of back with unspecified severity: Secondary | ICD-10-CM | POA: Diagnosis not present

## 2023-12-18 DIAGNOSIS — R5381 Other malaise: Secondary | ICD-10-CM

## 2023-12-25 DIAGNOSIS — L98429 Non-pressure chronic ulcer of back with unspecified severity: Secondary | ICD-10-CM | POA: Diagnosis not present

## 2023-12-25 DIAGNOSIS — R5381 Other malaise: Secondary | ICD-10-CM

## 2023-12-25 DIAGNOSIS — G35 Multiple sclerosis: Secondary | ICD-10-CM | POA: Diagnosis not present

## 2023-12-25 DIAGNOSIS — J9601 Acute respiratory failure with hypoxia: Secondary | ICD-10-CM | POA: Diagnosis not present

## 2023-12-25 DIAGNOSIS — I2782 Chronic pulmonary embolism: Secondary | ICD-10-CM | POA: Diagnosis not present

## 2024-01-01 DIAGNOSIS — R5381 Other malaise: Secondary | ICD-10-CM

## 2024-01-01 DIAGNOSIS — J9601 Acute respiratory failure with hypoxia: Secondary | ICD-10-CM | POA: Diagnosis not present

## 2024-01-01 DIAGNOSIS — L98429 Non-pressure chronic ulcer of back with unspecified severity: Secondary | ICD-10-CM | POA: Diagnosis not present

## 2024-01-01 DIAGNOSIS — G35 Multiple sclerosis: Secondary | ICD-10-CM | POA: Diagnosis not present

## 2024-01-01 DIAGNOSIS — I2782 Chronic pulmonary embolism: Secondary | ICD-10-CM | POA: Diagnosis not present

## 2024-01-22 ENCOUNTER — Other Ambulatory Visit: Payer: Self-pay | Admitting: Internal Medicine

## 2024-03-11 ENCOUNTER — Other Ambulatory Visit: Payer: Self-pay | Admitting: Internal Medicine
